# Patient Record
Sex: Male | Born: 1970 | Race: White | Hispanic: Yes | Marital: Married | Smoking: Current every day smoker
Health system: Southern US, Community
[De-identification: ages and names within clinical notes are randomized; demographics above are authoritative.]

## PROBLEM LIST (undated history)

## (undated) DIAGNOSIS — E559 Vitamin D deficiency, unspecified: Secondary | ICD-10-CM

## (undated) DIAGNOSIS — K659 Peritonitis, unspecified: Secondary | ICD-10-CM

## (undated) DIAGNOSIS — N186 End stage renal disease: Secondary | ICD-10-CM

## (undated) DIAGNOSIS — F329 Major depressive disorder, single episode, unspecified: Secondary | ICD-10-CM

## (undated) DIAGNOSIS — B348 Other viral infections of unspecified site: Secondary | ICD-10-CM

## (undated) DIAGNOSIS — F32A Depression, unspecified: Secondary | ICD-10-CM

## (undated) DIAGNOSIS — N529 Male erectile dysfunction, unspecified: Secondary | ICD-10-CM

## (undated) DIAGNOSIS — N289 Disorder of kidney and ureter, unspecified: Secondary | ICD-10-CM

## (undated) DIAGNOSIS — E1029 Type 1 diabetes mellitus with other diabetic kidney complication: Secondary | ICD-10-CM

## (undated) DIAGNOSIS — I10 Essential (primary) hypertension: Secondary | ICD-10-CM

## (undated) DIAGNOSIS — E7800 Pure hypercholesterolemia, unspecified: Secondary | ICD-10-CM

## (undated) DIAGNOSIS — E872 Acidosis, unspecified: Secondary | ICD-10-CM

## (undated) DIAGNOSIS — G43909 Migraine, unspecified, not intractable, without status migrainosus: Secondary | ICD-10-CM

## (undated) DIAGNOSIS — E1142 Type 2 diabetes mellitus with diabetic polyneuropathy: Secondary | ICD-10-CM

## (undated) HISTORY — DX: Major depressive disorder, single episode, unspecified: F32.9

## (undated) HISTORY — PX: PERITONEAL CATHETER REMOVAL: SHX2224

## (undated) HISTORY — DX: Depression, unspecified: F32.A

## (undated) HISTORY — DX: Migraine, unspecified, not intractable, without status migrainosus: G43.909

## (undated) HISTORY — PX: PERITONEAL CATHETER INSERTION: SHX2223

## (undated) HISTORY — DX: Type 2 diabetes mellitus with diabetic polyneuropathy: E11.42

## (undated) HISTORY — PX: KIDNEY TRANSPLANT: SHX239

---

## 2008-04-15 ENCOUNTER — Ambulatory Visit: Payer: Self-pay | Admitting: Internal Medicine

## 2008-04-15 LAB — CONVERTED CEMR LAB
ALT: 9 units/L (ref 0–53)
Albumin: 3.9 g/dL (ref 3.5–5.2)
Alkaline Phosphatase: 98 units/L (ref 39–117)
CO2: 22 meq/L (ref 19–32)
Chloride: 107 meq/L (ref 96–112)
Creatinine, Ser: 1.73 mg/dL — ABNORMAL HIGH (ref 0.40–1.50)
HDL: 44 mg/dL (ref >39)
LDL Cholesterol: 146 mg/dL — ABNORMAL HIGH (ref 0–99)
VLDL: 51 mg/dL — ABNORMAL HIGH (ref 0–40)

## 2008-04-16 ENCOUNTER — Ambulatory Visit: Payer: Self-pay | Admitting: *Deleted

## 2008-04-18 ENCOUNTER — Ambulatory Visit: Payer: Self-pay | Admitting: Internal Medicine

## 2008-04-30 ENCOUNTER — Ambulatory Visit: Payer: Self-pay | Admitting: Internal Medicine

## 2009-01-21 ENCOUNTER — Ambulatory Visit: Payer: Self-pay | Admitting: Internal Medicine

## 2009-02-12 ENCOUNTER — Ambulatory Visit: Payer: Self-pay | Admitting: Internal Medicine

## 2009-02-18 ENCOUNTER — Ambulatory Visit: Payer: Self-pay | Admitting: Internal Medicine

## 2009-03-16 ENCOUNTER — Ambulatory Visit: Payer: Self-pay | Admitting: Internal Medicine

## 2009-03-16 LAB — CONVERTED CEMR LAB
CO2: 17 meq/L — ABNORMAL LOW (ref 19–32)
Calcium: 8.3 mg/dL — ABNORMAL LOW (ref 8.4–10.5)
Chloride: 110 meq/L (ref 96–112)
Glucose, Bld: 204 mg/dL — ABNORMAL HIGH (ref 70–99)
Potassium: 5 meq/L (ref 3.5–5.3)

## 2009-03-25 ENCOUNTER — Ambulatory Visit: Payer: Self-pay | Admitting: Internal Medicine

## 2009-03-25 LAB — CONVERTED CEMR LAB
Alkaline Phosphatase: 100 units/L (ref 39–117)
CO2: 21 meq/L (ref 19–32)
Calcium: 9.2 mg/dL (ref 8.4–10.5)
Chloride: 112 meq/L (ref 96–112)
Cholesterol: 155 mg/dL (ref 0–200)
Potassium: 5.4 meq/L — ABNORMAL HIGH (ref 3.5–5.3)
Sodium: 141 meq/L (ref 135–145)
VLDL: 53 mg/dL — ABNORMAL HIGH (ref 0–40)

## 2009-03-27 ENCOUNTER — Ambulatory Visit: Payer: Self-pay | Admitting: Internal Medicine

## 2009-03-27 LAB — CONVERTED CEMR LAB
Anti Nuclear Antibody(ANA): NEGATIVE
Basophils Relative: 0 % (ref 0–1)
C3 Complement: 109 mg/dL (ref 88–201)
Calcium, Total (PTH): 8.9 mg/dL (ref 8.4–10.5)
Eosinophils Absolute: 0.6 10*3/uL (ref 0.0–0.7)
Ferritin: 69 ng/mL (ref 22–322)
HCT: 45.8 % (ref 39.0–52.0)
Hemoglobin: 14.5 g/dL (ref 13.0–17.0)
Hep A IgM: NEGATIVE
Iron: 135 ug/dL (ref 42–165)
Ketones, ur: NEGATIVE mg/dL
MCV: 97 fL (ref 78.0–100.0)
Magnesium: 2 mg/dL (ref 1.5–2.5)
Monocytes Absolute: 0.9 10*3/uL (ref 0.1–1.0)
Neutro Abs: 9 10*3/uL — ABNORMAL HIGH (ref 1.7–7.7)
Neutrophils Relative %: 66 % (ref 43–77)
Platelets: 271 10*3/uL (ref 150–400)
Saturation Ratios: 51 % (ref 20–55)
Specific Gravity, Urine: 1.012 (ref 1.005–1.030)
TIBC: 265 ug/dL (ref 215–435)
UIBC: 130 ug/dL
Urine Glucose: NEGATIVE mg/dL
WBC, UA: NONE SEEN cells/hpf (ref <3)
WBC: 13.6 10*3/uL — ABNORMAL HIGH (ref 4.0–10.5)

## 2009-04-01 ENCOUNTER — Ambulatory Visit (HOSPITAL_COMMUNITY): Admission: RE | Admit: 2009-04-01 | Discharge: 2009-04-01 | Payer: Self-pay | Admitting: Internal Medicine

## 2009-04-06 ENCOUNTER — Ambulatory Visit: Payer: Self-pay | Admitting: Internal Medicine

## 2009-04-06 LAB — CONVERTED CEMR LAB
CO2: 19 meq/L (ref 19–32)
Calcium: 9.2 mg/dL (ref 8.4–10.5)
Chloride: 108 meq/L (ref 96–112)
Creatinine, Ser: 2.62 mg/dL — ABNORMAL HIGH (ref 0.40–1.50)
Phosphorus: 3.6 mg/dL (ref 2.3–4.6)
Sodium: 141 meq/L (ref 135–145)

## 2009-04-28 ENCOUNTER — Ambulatory Visit: Payer: Self-pay | Admitting: Internal Medicine

## 2009-04-28 LAB — CONVERTED CEMR LAB
Calcium: 8.7 mg/dL (ref 8.4–10.5)
Creatinine, Ser: 2.8 mg/dL — ABNORMAL HIGH (ref 0.40–1.50)
Potassium: 6.1 meq/L — ABNORMAL HIGH (ref 3.5–5.3)

## 2009-04-30 ENCOUNTER — Ambulatory Visit: Payer: Self-pay | Admitting: Internal Medicine

## 2009-04-30 LAB — CONVERTED CEMR LAB
BUN: 54 mg/dL — ABNORMAL HIGH (ref 6–23)
CO2: 20 meq/L (ref 19–32)
Calcium: 8.2 mg/dL — ABNORMAL LOW (ref 8.4–10.5)
Chloride: 107 meq/L (ref 96–112)
Creatinine, Ser: 3.09 mg/dL — ABNORMAL HIGH (ref 0.40–1.50)
Glucose, Bld: 188 mg/dL — ABNORMAL HIGH (ref 70–99)
Potassium: 5 meq/L (ref 3.5–5.3)

## 2009-05-12 ENCOUNTER — Ambulatory Visit: Payer: Self-pay | Admitting: Internal Medicine

## 2009-05-12 LAB — CONVERTED CEMR LAB
Calcium: 9.2 mg/dL (ref 8.4–10.5)
Chloride: 108 meq/L (ref 96–112)
Glucose, Bld: 37 mg/dL — CL (ref 70–99)
Magnesium: 2 mg/dL (ref 1.5–2.5)
Phosphorus: 3.6 mg/dL (ref 2.3–4.6)
Potassium: 5.2 meq/L (ref 3.5–5.3)

## 2009-05-14 ENCOUNTER — Ambulatory Visit (HOSPITAL_COMMUNITY): Admission: RE | Admit: 2009-05-14 | Discharge: 2009-05-14 | Payer: Self-pay | Admitting: Internal Medicine

## 2009-05-14 ENCOUNTER — Ambulatory Visit: Payer: Self-pay | Admitting: Family Medicine

## 2009-06-30 ENCOUNTER — Ambulatory Visit: Payer: Self-pay | Admitting: Internal Medicine

## 2009-06-30 LAB — CONVERTED CEMR LAB
BUN: 39 mg/dL — ABNORMAL HIGH (ref 6–23)
CO2: 22 meq/L (ref 19–32)
Calcium: 9.3 mg/dL (ref 8.4–10.5)
Creatinine, Ser: 2.66 mg/dL — ABNORMAL HIGH (ref 0.40–1.50)
Glucose, Bld: 181 mg/dL — ABNORMAL HIGH (ref 70–99)
Magnesium: 2.2 mg/dL (ref 1.5–2.5)
Potassium: 5 meq/L (ref 3.5–5.3)
Sodium: 142 meq/L (ref 135–145)

## 2009-09-29 ENCOUNTER — Ambulatory Visit: Payer: Self-pay | Admitting: Internal Medicine

## 2009-09-29 LAB — CONVERTED CEMR LAB
Albumin: 4.1 g/dL (ref 3.5–5.2)
Basophils Absolute: 0.1 10*3/uL (ref 0.0–0.1)
Chloride: 105 meq/L (ref 96–112)
Eosinophils Absolute: 0.3 10*3/uL (ref 0.0–0.7)
Eosinophils Relative: 2 % (ref 0–5)
HCT: 42.5 % (ref 39.0–52.0)
Lymphocytes Relative: 15 % (ref 12–46)
MCHC: 32.2 g/dL (ref 30.0–36.0)
Magnesium: 2.3 mg/dL (ref 1.5–2.5)
Monocytes Relative: 5 % (ref 3–12)
Neutrophils Relative %: 78 % — ABNORMAL HIGH (ref 43–77)
Phosphorus: 4.1 mg/dL (ref 2.3–4.6)
RBC: 4.63 M/uL (ref 4.22–5.81)
RDW: 13.6 % (ref 11.5–15.5)

## 2009-10-05 ENCOUNTER — Ambulatory Visit: Payer: Self-pay | Admitting: Internal Medicine

## 2009-10-05 LAB — CONVERTED CEMR LAB
Albumin: 3.8 g/dL (ref 3.5–5.2)
Chloride: 108 meq/L (ref 96–112)
Phosphorus: 3.5 mg/dL (ref 2.3–4.6)

## 2010-01-05 ENCOUNTER — Ambulatory Visit: Payer: Self-pay | Admitting: Internal Medicine

## 2010-01-05 LAB — CONVERTED CEMR LAB
Albumin: 4.2 g/dL (ref 3.5–5.2)
BUN: 57 mg/dL — ABNORMAL HIGH (ref 6–23)
CO2: 18 meq/L — ABNORMAL LOW (ref 19–32)
Chloride: 108 meq/L (ref 96–112)
Glucose, Bld: 39 mg/dL — CL (ref 70–99)
Potassium: 4.8 meq/L (ref 3.5–5.3)
Sodium: 140 meq/L (ref 135–145)

## 2010-01-19 ENCOUNTER — Ambulatory Visit: Payer: Self-pay | Admitting: Internal Medicine

## 2010-01-19 LAB — CONVERTED CEMR LAB
CO2: 21 meq/L (ref 19–32)
Calcium: 8.7 mg/dL (ref 8.4–10.5)
Chloride: 110 meq/L (ref 96–112)
Glucose, Bld: 23 mg/dL — CL (ref 70–99)
Hemoglobin: 14.6 g/dL (ref 13.0–17.0)
Iron: 81 ug/dL (ref 42–165)
MCHC: 32.7 g/dL (ref 30.0–36.0)
MCV: 92.3 fL (ref 78.0–100.0)
Monocytes Absolute: 0.7 10*3/uL (ref 0.1–1.0)
Neutro Abs: 9.9 10*3/uL — ABNORMAL HIGH (ref 1.7–7.7)
Neutrophils Relative %: 65 % (ref 43–77)
RBC: 4.83 M/uL (ref 4.22–5.81)
Saturation Ratios: 28 % (ref 20–55)
Sodium: 141 meq/L (ref 135–145)
TIBC: 291 ug/dL (ref 215–435)
WBC: 15.3 10*3/uL — ABNORMAL HIGH (ref 4.0–10.5)

## 2010-02-18 ENCOUNTER — Ambulatory Visit: Payer: Self-pay | Admitting: Internal Medicine

## 2010-02-18 LAB — CONVERTED CEMR LAB: Hgb A1c MFr Bld: 6.6 % — ABNORMAL HIGH (ref <5.7)

## 2010-04-08 ENCOUNTER — Ambulatory Visit: Payer: Self-pay | Admitting: Internal Medicine

## 2010-04-08 LAB — CONVERTED CEMR LAB
CO2: 22 meq/L (ref 19–32)
Chloride: 107 meq/L (ref 96–112)

## 2010-06-30 ENCOUNTER — Encounter (INDEPENDENT_AMBULATORY_CARE_PROVIDER_SITE_OTHER): Payer: Self-pay | Admitting: *Deleted

## 2010-06-30 LAB — CONVERTED CEMR LAB
BUN: 58 mg/dL — ABNORMAL HIGH (ref 6–23)
CO2: 21 meq/L (ref 19–32)
Calcium: 9 mg/dL (ref 8.4–10.5)
Chloride: 108 meq/L (ref 96–112)
Phosphorus: 3.5 mg/dL (ref 2.3–4.6)
Potassium: 5.1 meq/L (ref 3.5–5.3)
Sodium: 141 meq/L (ref 135–145)
Vit D, 25-Hydroxy: 43 ng/mL (ref 30–89)

## 2010-09-17 ENCOUNTER — Encounter (INDEPENDENT_AMBULATORY_CARE_PROVIDER_SITE_OTHER): Payer: Self-pay | Admitting: *Deleted

## 2010-09-17 LAB — CONVERTED CEMR LAB
BUN: 71 mg/dL — ABNORMAL HIGH (ref 6–23)
CO2: 22 meq/L (ref 19–32)
Calcium: 8.2 mg/dL — ABNORMAL LOW (ref 8.4–10.5)
Chloride: 106 meq/L (ref 96–112)
Cholesterol: 185 mg/dL (ref 0–200)
Creatinine, Ser: 5.11 mg/dL — ABNORMAL HIGH (ref 0.40–1.50)
HDL: 51 mg/dL (ref >39)
Magnesium: 2.2 mg/dL (ref 1.5–2.5)
PTH: 341.3 pg/mL — ABNORMAL HIGH (ref 14.0–72.0)
Sodium: 141 meq/L (ref 135–145)
Total CHOL/HDL Ratio: 3.6
VLDL: 27 mg/dL (ref 0–40)

## 2010-09-29 ENCOUNTER — Ambulatory Visit (HOSPITAL_COMMUNITY)
Admission: RE | Admit: 2010-09-29 | Discharge: 2010-09-29 | Disposition: A | Discharge status: Home / Self Care | Payer: Self-pay | Source: Ambulatory Visit | Attending: Family Medicine | Admitting: Family Medicine

## 2010-09-29 DIAGNOSIS — E119 Type 2 diabetes mellitus without complications: Secondary | ICD-10-CM | POA: Insufficient documentation

## 2010-09-29 DIAGNOSIS — I10 Essential (primary) hypertension: Secondary | ICD-10-CM | POA: Insufficient documentation

## 2010-09-29 DIAGNOSIS — R011 Cardiac murmur, unspecified: Secondary | ICD-10-CM

## 2010-09-29 DIAGNOSIS — E785 Hyperlipidemia, unspecified: Secondary | ICD-10-CM | POA: Insufficient documentation

## 2010-09-29 DIAGNOSIS — F17200 Nicotine dependence, unspecified, uncomplicated: Secondary | ICD-10-CM | POA: Insufficient documentation

## 2010-10-18 ENCOUNTER — Encounter (INDEPENDENT_AMBULATORY_CARE_PROVIDER_SITE_OTHER): Payer: Self-pay | Admitting: *Deleted

## 2010-10-18 LAB — CONVERTED CEMR LAB
BUN: 67 mg/dL — ABNORMAL HIGH (ref 6–23)
CO2: 19 meq/L (ref 19–32)
Glucose, Bld: 189 mg/dL — ABNORMAL HIGH (ref 70–99)

## 2010-11-05 ENCOUNTER — Encounter (INDEPENDENT_AMBULATORY_CARE_PROVIDER_SITE_OTHER): Payer: Self-pay | Admitting: *Deleted

## 2010-11-05 LAB — CONVERTED CEMR LAB
Albumin: 4.5 g/dL (ref 3.5–5.2)
BUN: 62 mg/dL — ABNORMAL HIGH (ref 6–23)
CO2: 20 meq/L (ref 19–32)
Calcium: 8.8 mg/dL (ref 8.4–10.5)
Glucose, Bld: 120 mg/dL — ABNORMAL HIGH (ref 70–99)
Magnesium: 2.2 mg/dL (ref 1.5–2.5)
Phosphorus: 4.8 mg/dL — ABNORMAL HIGH (ref 2.3–4.6)
Sodium: 142 meq/L (ref 135–145)

## 2010-11-26 ENCOUNTER — Other Ambulatory Visit (HOSPITAL_COMMUNITY): Payer: Self-pay | Admitting: General Surgery

## 2010-11-26 ENCOUNTER — Encounter (HOSPITAL_COMMUNITY)
Admission: RE | Admit: 2010-11-26 | Discharge: 2010-11-26 | Disposition: A | Discharge status: Home / Self Care | Payer: Self-pay | Source: Ambulatory Visit | Attending: General Surgery | Admitting: General Surgery

## 2010-11-26 ENCOUNTER — Ambulatory Visit (HOSPITAL_COMMUNITY)
Admission: RE | Admit: 2010-11-26 | Discharge: 2010-11-26 | Disposition: A | Discharge status: Home / Self Care | Payer: Self-pay | Source: Ambulatory Visit | Attending: General Surgery | Admitting: General Surgery

## 2010-11-26 DIAGNOSIS — Z01818 Encounter for other preprocedural examination: Secondary | ICD-10-CM | POA: Insufficient documentation

## 2010-11-26 DIAGNOSIS — Z01812 Encounter for preprocedural laboratory examination: Secondary | ICD-10-CM | POA: Insufficient documentation

## 2010-11-26 DIAGNOSIS — Z01810 Encounter for preprocedural cardiovascular examination: Secondary | ICD-10-CM | POA: Insufficient documentation

## 2010-11-26 DIAGNOSIS — Z01811 Encounter for preprocedural respiratory examination: Secondary | ICD-10-CM

## 2010-11-26 LAB — DIFFERENTIAL
Lymphocytes Relative: 17 % (ref 12–46)
Lymphs Abs: 2.8 10*3/uL (ref 0.7–4.0)
Monocytes Absolute: 0.7 10*3/uL (ref 0.1–1.0)
Neutrophils Relative %: 77 % (ref 43–77)

## 2010-11-26 LAB — BASIC METABOLIC PANEL
CO2: 20 mEq/L (ref 19–32)
GFR calc Af Amer: 14 mL/min — ABNORMAL LOW (ref >60)
Glucose, Bld: 234 mg/dL — ABNORMAL HIGH (ref 70–99)
Potassium: 5.3 mEq/L — ABNORMAL HIGH (ref 3.5–5.1)

## 2010-11-26 LAB — CBC
Hemoglobin: 13.3 g/dL (ref 13.0–17.0)
MCH: 31.3 pg (ref 26.0–34.0)
MCHC: 35.2 g/dL (ref 30.0–36.0)
MCV: 88.9 fL (ref 78.0–100.0)
Platelets: 237 10*3/uL (ref 150–400)
RBC: 4.25 MIL/uL (ref 4.22–5.81)
RDW: 14 % (ref 11.5–15.5)
WBC: 16.6 10*3/uL — ABNORMAL HIGH (ref 4.0–10.5)

## 2010-11-30 ENCOUNTER — Ambulatory Visit (HOSPITAL_COMMUNITY)
Admission: RE | Admit: 2010-11-30 | Discharge: 2010-11-30 | Disposition: A | Discharge status: Home / Self Care | Payer: Self-pay | Source: Ambulatory Visit | Attending: General Surgery | Admitting: General Surgery

## 2010-11-30 ENCOUNTER — Other Ambulatory Visit (HOSPITAL_COMMUNITY): Payer: Self-pay | Admitting: General Surgery

## 2010-11-30 DIAGNOSIS — E1129 Type 2 diabetes mellitus with other diabetic kidney complication: Secondary | ICD-10-CM | POA: Insufficient documentation

## 2010-11-30 DIAGNOSIS — Z01811 Encounter for preprocedural respiratory examination: Secondary | ICD-10-CM

## 2010-11-30 DIAGNOSIS — N186 End stage renal disease: Secondary | ICD-10-CM | POA: Insufficient documentation

## 2010-11-30 LAB — BASIC METABOLIC PANEL
Creatinine, Ser: 5.47 mg/dL — ABNORMAL HIGH (ref 0.4–1.5)
GFR calc Af Amer: 14 mL/min — ABNORMAL LOW (ref >60)
Glucose, Bld: 249 mg/dL — ABNORMAL HIGH (ref 70–99)
Potassium: 4.7 mEq/L (ref 3.5–5.1)

## 2010-11-30 LAB — DIFFERENTIAL
Lymphocytes Relative: 21 % (ref 12–46)
Monocytes Relative: 8 % (ref 3–12)
Neutro Abs: 9.1 10*3/uL — ABNORMAL HIGH (ref 1.7–7.7)

## 2010-11-30 LAB — GLUCOSE, CAPILLARY
Glucose-Capillary: 207 mg/dL — ABNORMAL HIGH (ref 70–99)
Glucose-Capillary: 232 mg/dL — ABNORMAL HIGH (ref 70–99)
Glucose-Capillary: 218 mg/dL — ABNORMAL HIGH (ref 70–99)
Glucose-Capillary: 345 mg/dL — ABNORMAL HIGH (ref 70–99)

## 2010-11-30 LAB — POCT I-STAT 4, (NA,K, GLUC, HGB,HCT): Hemoglobin: 13.6 g/dL (ref 13.0–17.0)

## 2010-11-30 LAB — CBC
HCT: 38.3 % — ABNORMAL LOW (ref 39.0–52.0)
MCH: 30.9 pg (ref 26.0–34.0)
MCV: 89.1 fL (ref 78.0–100.0)
Platelets: 222 10*3/uL (ref 150–400)
RDW: 14.1 % (ref 11.5–15.5)

## 2010-12-09 NOTE — Op Note (Signed)
Mark Miranda, MCCRAVY NO.:  [id number]  MEDICAL RECORD NO.:  [id number]           PATIENT TYPE:  O  LOCATION:  XRAY                         FACILITY:  [facility]  PHYSICIAN:  Anselm Pancoast. Erven Ramson, M.D.DATE OF BIRTH:  [birth year]  DATE OF PROCEDURE:  11/30/2010 DATE OF DISCHARGE:                              OPERATIVE REPORT   PREOPERATIVE DIAGNOSIS:  End-stage renal disease, desires continuous ambulatory peritoneal dialysis secondary to diabetes.  POSTOPERATIVE DIAGNOSIS:  End-stage renal disease, desires continuous ambulatory peritoneal dialysis secondary to diabetes.  OPERATION:  Placement of a Moncrief-Popovich CAPD catheter.  General anesthesia, local supplementation.  SURGEON:  Anselm Pancoast. Zachery Dakins, MD  INDICATION:  Mark Miranda is a 40 year old [place] American who was referred to me by Dr. Allena Katz for placement of Moncrief-Popovich CAPD catheter.  The patient is a diabetic since March 09, 2014.  He presently has had some changes in his insulin management.  He is seeing the renal failure doctors and has a creatinine approximately 5.5 with a BUN in the 70s.  Dr. Allena Katz who I talked to approximately a week ago says he thinks it will be probably 6 months before he actually needs peritoneal dialysis or dialysis and the patient desires CAPD.  He is presently not work.  He used to be ICE planner, but he says because of his medical problems and also the work needs are so slack now that he has not worked for the last year or so.  On physical exam he has had no previous abdominal surgeries.  I cannot appreciate any evidence of a left or right inguinal hernia and the patient understands that the catheter will be buried and then will be exteriorized when the need for CAPD just start.  Probably will be retired in July and I have talked with Dr. Michaell Cowing who is aware of the patient's needs and will plan on exteriorizing the catheter when it is needed.  The patient on his  laboratory studies that were measured Friday; his white count was 16,000, his potassium was 5.2.  The patient has not had any fever or change.  He has had a chest x- ray, everything looks fine, and he had a repeat white count done today 13,800, his potassium which was 5.2 Friday is 4.7 today, and I think it will be safe to proceed on with CAPD catheter placement.  The patient will need general anesthesia and preoperatively was given a gram of Ancef, positioned on the OR table.  Induction of general anesthesia. The time-out was completed and the abdomen was prepped with Betadine surgical scrub and solution and draped in a sterile manner.  A small area to the right and slightly below the umbilical was infiltrated with 0.5% Marcaine with adrenaline and then a little incision was made in the skin down through the thin layer of subcutaneous tissue and then the small vein was sutured with 3-0 chromic.  The external oblique over the rectus was opened.  The underlying rectus muscle was split in direction of its fibers exposing the posterior rectus fascia and peritoneum.  Two hemostats were used to pick up the posterior rectus fascia and  little small opening was made.  Two pursestrings of 2-0 Vicryl were placed and then the CAPD catheter, Moncrief-Popovich was placed.  The little internal cuff is right at the fascia and the pursestring sutures were tied since this is not the form of Western, I used one stitch to kind of anchor the internal cuff so it does not slip and slide.  Next, the catheter was then tunneled after the rectus muscle had been approximated with 3-0 chromic, so it goes obliquely through the muscle layers and I positioned the catheter over guidewire, reinserted the guidewire to make sure it is lying comfortably, and then flushed the catheter also with saline and it goes in the gravity.  I then closed the external oblique with interrupted sutures of 2-0 Prolene, tunneled the  catheter subcutaneously, and then exited to the right lower quadrant and then I tied a 2-0 Vicryl very loosely basically at the internal cuff area just distal to the cuff so that the Prolene can be used as a handle when the catheter is exteriorized.  The catheter was then tunneled on across the abdomen and brought out through the skin through a little stab wound and then I hooked up 5 mL of 100 units of heparin per mL in a 10 mL syringe, flushed the catheter, tied with double tooth suture of 2-0 silk to keep the little heparin in place.  The catheter was then placed through the subcutaneous tissue and the skin incisions were closed with interrupted sutures of 5-0 nylon.  The patient tolerated procedure nicely, was extubated, and sent to the recovery room in stable postop condition.  We will check his glucose in the recovery room.  He will have Vicodin for pain and if his sugars are doing fine, he can be discharged later today. I am going to give him another gram of Ancef since he will not be on antibiotics and see [place] in the office in approximately 10 days for suture removal.     Anselm Pancoast. Zachery Dakins, M.D.     WJW/MEDQ  D:  11/30/2010  T:  11/30/2010  Job:  [id number]  cc:   Dr. Allena Katz  Electronically Signed by Consuello Bossier M.D. on 12/09/2010 11:49:15 AM

## 2011-01-25 ENCOUNTER — Encounter (INDEPENDENT_AMBULATORY_CARE_PROVIDER_SITE_OTHER): Payer: Self-pay | Admitting: General Surgery

## 2011-01-25 DIAGNOSIS — E1029 Type 1 diabetes mellitus with other diabetic kidney complication: Secondary | ICD-10-CM | POA: Insufficient documentation

## 2011-01-25 DIAGNOSIS — N289 Disorder of kidney and ureter, unspecified: Secondary | ICD-10-CM

## 2011-01-25 DIAGNOSIS — IMO0002 Reserved for concepts with insufficient information to code with codable children: Secondary | ICD-10-CM | POA: Insufficient documentation

## 2011-01-25 DIAGNOSIS — I10 Essential (primary) hypertension: Secondary | ICD-10-CM

## 2011-08-09 LAB — HM DIABETES EYE EXAM

## 2011-10-30 ENCOUNTER — Emergency Department (HOSPITAL_COMMUNITY): Payer: Self-pay

## 2011-10-30 ENCOUNTER — Other Ambulatory Visit: Payer: Self-pay

## 2011-10-30 ENCOUNTER — Emergency Department (HOSPITAL_COMMUNITY)
Admission: EM | Admit: 2011-10-30 | Discharge: 2011-10-31 | Disposition: A | Discharge status: Home / Self Care | Payer: Self-pay | Attending: Emergency Medicine | Admitting: Emergency Medicine

## 2011-10-30 ENCOUNTER — Encounter (HOSPITAL_COMMUNITY): Payer: Self-pay | Admitting: *Deleted

## 2011-10-30 DIAGNOSIS — R209 Unspecified disturbances of skin sensation: Secondary | ICD-10-CM | POA: Insufficient documentation

## 2011-10-30 DIAGNOSIS — E7800 Pure hypercholesterolemia, unspecified: Secondary | ICD-10-CM | POA: Insufficient documentation

## 2011-10-30 DIAGNOSIS — Z79899 Other long term (current) drug therapy: Secondary | ICD-10-CM | POA: Insufficient documentation

## 2011-10-30 DIAGNOSIS — I10 Essential (primary) hypertension: Secondary | ICD-10-CM | POA: Insufficient documentation

## 2011-10-30 DIAGNOSIS — R569 Unspecified convulsions: Secondary | ICD-10-CM | POA: Insufficient documentation

## 2011-10-30 DIAGNOSIS — E119 Type 2 diabetes mellitus without complications: Secondary | ICD-10-CM | POA: Insufficient documentation

## 2011-10-30 HISTORY — DX: Pure hypercholesterolemia, unspecified: E78.00

## 2011-10-30 HISTORY — DX: Essential (primary) hypertension: I10

## 2011-10-30 HISTORY — DX: Disorder of kidney and ureter, unspecified: N28.9

## 2011-10-30 LAB — DIFFERENTIAL
Basophils Absolute: 0.1 10*3/uL (ref 0.0–0.1)
Eosinophils Relative: 2 % (ref 0–5)
Lymphocytes Relative: 12 % (ref 12–46)
Lymphs Abs: 1.6 10*3/uL (ref 0.7–4.0)
Neutro Abs: 10.6 10*3/uL — ABNORMAL HIGH (ref 1.7–7.7)
Neutrophils Relative %: 81 % — ABNORMAL HIGH (ref 43–77)

## 2011-10-30 LAB — COMPREHENSIVE METABOLIC PANEL
ALT: 10 U/L (ref 0–53)
AST: 14 U/L (ref 0–37)
Alkaline Phosphatase: 105 U/L (ref 39–117)
CO2: 18 mEq/L — ABNORMAL LOW (ref 19–32)
Calcium: 6.3 mg/dL — CL (ref 8.4–10.5)
Chloride: 100 mEq/L (ref 96–112)
GFR calc non Af Amer: 6 mL/min — ABNORMAL LOW (ref >90)
Glucose, Bld: 84 mg/dL (ref 70–99)
Potassium: 4 mEq/L (ref 3.5–5.1)
Sodium: 139 mEq/L (ref 135–145)

## 2011-10-30 LAB — CBC
Platelets: 234 10*3/uL (ref 150–400)
RBC: 3.47 MIL/uL — ABNORMAL LOW (ref 4.22–5.81)
RDW: 14.4 % (ref 11.5–15.5)
WBC: 13.1 10*3/uL — ABNORMAL HIGH (ref 4.0–10.5)

## 2011-10-30 LAB — GLUCOSE, CAPILLARY: Glucose-Capillary: 87 mg/dL (ref 70–99)

## 2011-10-30 MED ORDER — SODIUM CHLORIDE 0.9 % IV SOLN
1.0000 g | INTRAVENOUS | Status: AC
  Administered 2011-10-30: 1 g via INTRAVENOUS
  Filled 2011-10-30: qty 10

## 2011-10-30 NOTE — ED Notes (Signed)
Pt states that he was having "problems controlling his right arm" around 6pm (and one episode yesterday).  Pt also had facial numbness (bilateral).  Family reports that pt was having "problems controlling his limbs".  No facial asymetry, no focal weakness or slurred speech, no arm drift.  EDP to the bedside at this time (speaking spanish with pt.  Pt speaks spanish as well as daughter at the the bedside

## 2011-10-30 NOTE — ED Notes (Signed)
MD aware of pt's elevated calcium level

## 2011-10-30 NOTE — ED Notes (Signed)
MD at bedside for eval.

## 2011-10-30 NOTE — ED Notes (Signed)
Pt c/o pain at IV site. IV flushes well with good blood return. No signs of infiltration noted. Advised pt i would d/c and place in new site for his comfort if he would like. Pt declined at this time.

## 2011-10-30 NOTE — ED Provider Notes (Addendum)
History     CSN: [id number]  Arrival date & time 10/30/11  1859   First MD Initiated Contact with Patient 10/30/11 2044      Chief Complaint  Patient presents with  . Numbness    (Consider location/radiation/quality/duration/timing/severity/associated sxs/prior treatment) Patient is a 41 y.o. male presenting with seizures. The history is provided by the patient.  Seizures  This is a new problem. The current episode started 1 to 2 hours ago. The problem has been resolved. There was 1 seizure. Duration: 10 minutes. Characteristics include rhythmic jerking. Characteristics do not include eye blinking, eye deviation, bowel incontinence, bladder incontinence, loss of consciousness or bit tongue. The episode was witnessed.    Past Medical History  Diagnosis Date  . Diabetes mellitus   . Renal disorder   . Hypertension   . Hypercholesterolemia     Past Surgical History  Procedure Date  . Peritoneal catheter insertion     No family history on file.  History  Substance Use Topics  . Smoking status: Current Everyday Smoker  . Smokeless tobacco: Not on file  . Alcohol Use: No      Review of Systems  Gastrointestinal: Negative for bowel incontinence.  Genitourinary: Negative for bladder incontinence.  Neurological: Positive for seizures. Negative for loss of consciousness.  All other systems reviewed and are negative.    Allergies  Review of patient's allergies indicates no known allergies.  Home Medications   Current Outpatient Rx  Name Route Sig Dispense Refill  . AMLODIPINE BESYLATE 10 MG PO TABS Oral Take 10 mg by mouth at bedtime.    Marland Kitchen CALCIUM ACETATE (PHOS BINDER) 667 MG/5ML PO SOLN Oral Take 667 mg by mouth 3 (three) times daily with meals.    Marland Kitchen DILTIAZEM HCL ER BEADS 240 MG PO CP24 Oral Take 240 mg by mouth daily.    . INSULIN ASPART PROT & ASPART (70-30) 100 UNIT/ML [place] High SUSP Subcutaneous Inject 15-30 Units into the skin 2 (two) times daily with a meal. Inject  30 units in the morning, and inject 15 units at night.    Marland Kitchen ROSUVASTATIN CALCIUM 10 MG PO TABS Oral Take 10 mg by mouth at bedtime.    Marland Kitchen SILDENAFIL CITRATE 50 MG PO TABS Oral Take 50 mg by mouth daily as needed. For E.D.      BP 144/86  Pulse 87  Temp(Src) 98 F (36.7 C) (Oral)  Resp 13  Ht 5\' 7"  (1.702 m)  Wt 140 lb (63.504 kg)  BMI 21.93 kg/m2  SpO2 99%  Physical Exam  Constitutional: He is oriented to person, place, and time. He appears well-developed and well-nourished.  HENT:  Head: Normocephalic and atraumatic.  Mouth/Throat: Oropharynx is clear and moist.  Eyes: Pupils are equal, round, and reactive to light.  Neck: Normal range of motion. Neck supple.  Cardiovascular: Normal rate, regular rhythm, normal heart sounds and intact distal pulses.   Pulmonary/Chest: Effort normal and breath sounds normal.  Abdominal: Soft. Bowel sounds are normal.  Musculoskeletal: Normal range of motion.  Neurological: He is alert and oriented to person, place, and time. He has normal strength. No cranial nerve deficit or sensory deficit. Coordination normal. GCS eye subscore is 4. GCS verbal subscore is 5. GCS motor subscore is 6.    ED Course  Procedures (including critical care time)  Results for orders placed during the hospital encounter of 10/30/11  GLUCOSE, CAPILLARY      Component Value Range   Glucose-Capillary 87  70 -  99 (mg/dL)  CBC      Component Value Range   WBC 13.1 (*) 4.0 - 10.5 (K/uL)   RBC 3.47 (*) 4.22 - 5.81 (MIL/uL)   Hemoglobin 10.6 (*) 13.0 - 17.0 (g/dL)   HCT 91.4 (*) 78.2 - 52.0 (%)   MCV 89.0  78.0 - 100.0 (fL)   MCH 30.5  26.0 - 34.0 (pg)   MCHC 34.3  30.0 - 36.0 (g/dL)   RDW 95.6  21.3 - 08.6 (%)   Platelets 234  150 - 400 (K/uL)  DIFFERENTIAL      Component Value Range   Neutrophils Relative 81 (*) 43 - 77 (%)   Neutro Abs 10.6 (*) 1.7 - 7.7 (K/uL)   Lymphocytes Relative 12  12 - 46 (%)   Lymphs Abs 1.6  0.7 - 4.0 (K/uL)   Monocytes Relative 6  3  - 12 (%)   Monocytes Absolute 0.7  0.1 - 1.0 (K/uL)   Eosinophils Relative 2  0 - 5 (%)   Eosinophils Absolute 0.2  0.0 - 0.7 (K/uL)   Basophils Relative 0  0 - 1 (%)   Basophils Absolute 0.1  0.0 - 0.1 (K/uL)  COMPREHENSIVE METABOLIC PANEL      Component Value Range   Sodium 139  135 - 145 (mEq/L)   Potassium 4.0  3.5 - 5.1 (mEq/L)   Chloride 100  96 - 112 (mEq/L)   CO2 18 (*) 19 - 32 (mEq/L)   Glucose, Bld 84  70 - 99 (mg/dL)   BUN 93 (*) 6 - 23 (mg/dL)   Creatinine, Ser 57.84 (*) 0.50 - 1.35 (mg/dL)   Calcium 6.3 (*) 8.4 - 10.5 (mg/dL)   Total Protein 7.3  6.0 - 8.3 (g/dL)   Albumin 3.8  3.5 - 5.2 (g/dL)   AST 14  0 - 37 (U/L)   ALT 10  0 - 53 (U/L)   Alkaline Phosphatase 105  39 - 117 (U/L)   Total Bilirubin 0.2 (*) 0.3 - 1.2 (mg/dL)   GFR calc non Af Amer 6 (*) >90 (mL/min)   GFR calc Af Amer 7 (*) >90 (mL/min)   Ct Head Wo Contrast  10/30/2011  *RADIOLOGY REPORT*  Clinical Data: 41 year old male with seizure, and facial numbness.  CT HEAD WITHOUT CONTRAST  Technique:  Contiguous axial images were obtained from the base of the skull through the vertex without contrast.  Comparison: None.  Findings: No acute intracranial abnormalities are identified, including mass lesion or mass effect, hydrocephalus, extra-axial fluid collection, midline shift, hemorrhage, or acute infarction.  The visualized bony calvarium is unremarkable.  IMPRESSION: No evidence of acute intracranial abnormality.  Original Report Authenticated By: Rosendo Gros, M.D.     Date: 10/31/2011  Rate: 88  Rhythm: normal sinus rhythm  QRS Axis: normal  Intervals: normal  ST/T Wave abnormalities: nonspecific T wave changes  Conduction Disutrbances:none  Narrative Interpretation:   Old EKG Reviewed: none available     1. Hypocalcemia   2. Seizure       MDM  Pt with hypocalcemia, seizure activity.  Is on calcium replacement, but unsure what his baseline is.  Albumin normal.  Spoke with nephrologist on  call who recommends admission.  I advised pt of this, but he is refusing admission.  Advised him of risks of leaving including having another seizure, brain damage and death.  Pt still refusing admission.  Speaks English well.  Seems to comprehend fully.  Also discussed with family members in  room.  Advised him to see his nephrologist, Dr. Allena Katz tomorrow.        Rolan Bucco, MD 10/30/11 3086  Rolan Bucco, MD 10/31/11 5784  Rolan Bucco, MD 10/31/11 6962  Rolan Bucco, MD 11/30/11 1721

## 2011-10-30 NOTE — ED Notes (Signed)
Pt returns from CT. Ambulates to restroom in NAD

## 2011-10-30 NOTE — Discharge Instructions (Signed)
Hypocalcemia, Adult Hypocalcemia is low blood calcium. Calcium is important for cells to function in the body. Low blood calcium can cause a variety of symptoms and problems. CAUSES   Low levels of a body protein called albumin.   Problems with the parathyroid glands or surgical removal of the parathyroid glands. The parathyroid glands maintain the body's level of calcium.   Decreased production or improper use of parathyroid hormone.   Lack (deficiency) of vitamin D or magnesium or both.   Intestinal problems that interfere with nutrient absorption.   Alcoholism.   Kidney problems.   Inflammation of the pancreas (pancreatitis).   Certain medicines.   Severe infections (sepsis).   Infiltrative diseases. With these diseases the parathyroid glands are filled with cells or substances that are not normally present. Examples include:   Sarcoidosis.   Hemachromatosis.   Breakdown of large amounts of muscle fiber.   High levels of phosphate in the body.   Cancer.   Massive blood transfusions which usually occur with severe trauma.  SYMPTOMS   Numbness and tingling in the fingers, toes, or around the mouth.   Muscle aches or cramps, especially in the legs, feet, and back.   Muscle twitches.   Shortness of breath or wheezing.   Difficulty swallowing.   Changes in the sound of the voice.   General weakness.   Fainting.   Fast heart beats (palpitations).   Chest pain.   Irritability.   Difficulty thinking.   Memory problems or confusion.   Severe fatigue.   Changes in personality.   Depression and anxiety.   Shaking uncontrollably (seizures).   Coarse, brittle hair and nails.   Dry skin or lasting (chronic) skin diseases (psoriasis, eczema, or dermatitis).   Clouding of the eye lens (cataracts).   Abdominal cramping or pain.  DIAGNOSIS  Hypocalcemia is usually diagnosed through blood tests that reveal a low level of blood calcium. Other tests,  such as a recording of the electrical activity of the heart (electrocardiogram, EKG), may be performed in order to diagnose the underlying cause of the condition. TREATMENT  Treatment for hypocalcemia includes giving calcium supplements. These can be given by mouth or by intravenous (IV) access tube, depending on the severity of the symptoms and deficiency. Other minerals (electrolytes), such as magnesium, may also be given. HOME CARE INSTRUCTIONS   Meet with a dietitian to make sure you are eating the most healthful diet possible, or follow diet instructions as directed by your caregiver.   Follow up with your caregiver as directed.  SEEK IMMEDIATE MEDICAL CARE IF:   You develop chest pain.   You develop persistent rapid or irregular heartbeats.   You have difficulty breathing.   You faint.   You develop increased fatigue.   You have new swelling in the feet, ankles, or legs.   You develop increased muscle twitching.   You start to have seizures.   You develop confusion.   You develop mood, memory, or personality changes.  MAKE SURE YOU:   Understand these instructions.   Will watch your condition.   Will get help right away if you are not doing well or get worse.  Document Released: 01/12/2010 Document Revised: 07/14/2011 Document Reviewed: 01/12/2010 Exit[employer] Patient Information 2012 [employer]. 

## 2011-11-21 ENCOUNTER — Other Ambulatory Visit: Payer: Self-pay

## 2011-11-21 DIAGNOSIS — Z01810 Encounter for preprocedural cardiovascular examination: Secondary | ICD-10-CM

## 2011-11-21 DIAGNOSIS — N185 Chronic kidney disease, stage 5: Secondary | ICD-10-CM

## 2011-12-07 ENCOUNTER — Encounter: Payer: Self-pay | Admitting: Vascular Surgery

## 2011-12-08 ENCOUNTER — Ambulatory Visit: Payer: Self-pay | Admitting: Vascular Surgery

## 2011-12-19 ENCOUNTER — Other Ambulatory Visit (HOSPITAL_COMMUNITY): Payer: Self-pay | Admitting: Family Medicine

## 2011-12-19 DIAGNOSIS — M71321 Other bursal cyst, right elbow: Secondary | ICD-10-CM

## 2011-12-19 DIAGNOSIS — IMO0002 Reserved for concepts with insufficient information to code with codable children: Secondary | ICD-10-CM

## 2011-12-22 ENCOUNTER — Ambulatory Visit (HOSPITAL_COMMUNITY)
Admission: RE | Admit: 2011-12-22 | Discharge: 2011-12-22 | Disposition: A | Discharge status: Home / Self Care | Payer: Self-pay | Source: Ambulatory Visit | Attending: Family Medicine | Admitting: Family Medicine

## 2011-12-22 DIAGNOSIS — M7020 Olecranon bursitis, unspecified elbow: Secondary | ICD-10-CM | POA: Insufficient documentation

## 2011-12-22 DIAGNOSIS — L723 Sebaceous cyst: Secondary | ICD-10-CM | POA: Insufficient documentation

## 2011-12-22 DIAGNOSIS — IMO0002 Reserved for concepts with insufficient information to code with codable children: Secondary | ICD-10-CM

## 2011-12-28 ENCOUNTER — Encounter: Payer: Self-pay | Admitting: Vascular Surgery

## 2011-12-29 ENCOUNTER — Ambulatory Visit (INDEPENDENT_AMBULATORY_CARE_PROVIDER_SITE_OTHER): Payer: Self-pay | Admitting: Vascular Surgery

## 2011-12-29 ENCOUNTER — Ambulatory Visit (INDEPENDENT_AMBULATORY_CARE_PROVIDER_SITE_OTHER): Payer: Self-pay | Admitting: *Deleted

## 2011-12-29 ENCOUNTER — Encounter: Payer: Self-pay | Admitting: Vascular Surgery

## 2011-12-29 VITALS — BP 148/84 | HR 73 | Temp 98.1°F | Resp 20 | Ht 68.0 in | Wt 130.0 lb

## 2011-12-29 DIAGNOSIS — N186 End stage renal disease: Secondary | ICD-10-CM | POA: Insufficient documentation

## 2011-12-29 DIAGNOSIS — N185 Chronic kidney disease, stage 5: Secondary | ICD-10-CM

## 2011-12-29 DIAGNOSIS — Z01810 Encounter for preprocedural cardiovascular examination: Secondary | ICD-10-CM

## 2011-12-29 NOTE — Progress Notes (Signed)
[facility] HISTORY AND PHYSICAL   History of Present Illness:  Patient is a 41 y.o. year old male who presents for placement of a permanent hemodialysis access. The patient is right handed .  The patient is not currently on hemodialysis.  The cause of renal failure is thought to be secondary to hypertension diabetes.  Other chronic medical problems include elevated cholesterol.  Past Medical History  Diagnosis Date  . Diabetes mellitus   . Renal disorder   . Hypertension   . Hypercholesterolemia     Past Surgical History  Procedure Date  . Peritoneal catheter insertion      Social History History  Substance Use Topics  . Smoking status: Current Everyday Smoker -- 1.0 packs/day    Types: Cigarettes  . Smokeless tobacco: Never Used  . Alcohol Use: No    Family History Family History  Problem Relation Age of Onset  . Diabetes Mother     Allergies  No Known Allergies   Current Outpatient Prescriptions  Medication Sig Dispense Refill  . amLODipine (NORVASC) 10 MG tablet Take 10 mg by mouth at bedtime.      . calcium acetate, Phos Binder, (PHOSLYRA) 667 MG/5ML SOLN Take 667 mg by mouth 3 (three) times daily with meals.      Marland Kitchen diltiazem (TIAZAC) 240 MG 24 hr capsule Take 240 mg by mouth daily.      . insulin aspart protamine-insulin aspart (NOVOLOG 70/30) (70-30) 100 UNIT/ML injection Inject 15-30 Units into the skin 2 (two) times daily with a meal. Inject 30 units in the morning, and inject 15 units at night.      . rosuvastatin (CRESTOR) 10 MG tablet Take 10 mg by mouth at bedtime.      . sildenafil (VIAGRA) 50 MG tablet Take 50 mg by mouth daily as needed. For E.D.      . DISCONTD: diltiazem (CARDIZEM) 30 MG tablet Take 30 mg by mouth 4 (four) times daily. Ask patient to verify dosage and frequency. Not indicated on medical history form dated 10/23/10.       Marland Kitchen DISCONTD: insulin lispro (HUMALOG) 100 UNIT/ML injection Inject into the skin 3  (three) times daily before meals. Ask patient to verify name/dosage/. History form noted dosage of 34 units during day and 22 units at night.         ROS:   General:  No weight loss, Fever, chills  HEENT: No recent headaches, no nasal bleeding, no visual changes, no sore throat  Neurologic: No dizziness, blackouts, seizures. No recent symptoms of stroke or mini- stroke. No recent episodes of slurred speech, or temporary blindness.  Cardiac: No recent episodes of chest pain/pressure, no shortness of breath at rest.  No shortness of breath with exertion.  Denies history of atrial fibrillation or irregular heartbeat  Vascular: No history of rest pain in feet.  No history of claudication.  No history of non-healing ulcer, No history of DVT   Pulmonary: No home oxygen, no productive cough, no hemoptysis,  No asthma or wheezing  Musculoskeletal:  [ ]  Arthritis, [ ]  Low back pain,  [ ]  Joint pain  Hematologic:No history of hypercoagulable state.  No history of easy bleeding.  No history of anemia  Gastrointestinal: No hematochezia or melena,  No gastroesophageal reflux, no trouble swallowing  Urinary: [x ] chronic Kidney disease, [ ]  on HD - [ ]  MWF or [ ]  TTHS, [ ]  Burning with urination, [ ]  Frequent urination, [ ]   Difficulty urinating;   Skin: No rashes  Psychological: No history of anxiety,  No history of depression   Physical Examination  Filed Vitals:   12/29/11 1551  BP: 148/84  Pulse: 73  Temp: 98.1 F (36.7 C)  TempSrc: Oral  Resp: 20  Height: 5\' 8"  (1.727 m)  Weight: 130 lb (58.968 kg)    Body mass index is 19.77 kg/(m^2).  General:  Alert and oriented, no acute distress HEENT: Normal Neck: No bruit or JVD Pulmonary: Clear to auscultation bilaterally Cardiac: Regular Rate and Rhythm without murmur Gastrointestinal: Soft, non-tender, non-distended Skin: No rash Extremity Pulses:  2+ radial, brachial pulses bilaterally Musculoskeletal: No deformity or  edema  Neurologic: Upper and lower extremity motor 5/5 and symmetric  DATA: Patient had a bilateral upper extremity vein mapping today which I reviewed and interpreted. Shows the cephalic vein on the right arm is less than 2 mm throughout most of its course the basilic vein was 2-3 mm on the right 2-1/2 mm in the upper arm on the left the left cephalic vein was between 25 and 30 mm but of larger caliber in the upper arm   ASSESSMENT: Needs hemodialysis access    PLAN:  Left brachiocephalic AV fistula scheduled for 01/10/2012. Risk, benefits, and alternatives to access surgery were discussed.  The patient is aware the risks include but are not limited to: bleeding, infection, steal syndrome, nerve damage, ischemic neuropathy, failure to mature, and need for additional procedures. The patient agrees to proceed.  Fabienne Bruns, MD [facility] Office: [phone] Pager: [phone]

## 2012-01-05 ENCOUNTER — Encounter (HOSPITAL_COMMUNITY): Payer: Self-pay | Admitting: Respiratory Therapy

## 2012-01-05 ENCOUNTER — Other Ambulatory Visit: Payer: Self-pay

## 2012-01-06 ENCOUNTER — Other Ambulatory Visit: Payer: Self-pay | Admitting: *Deleted

## 2012-01-06 ENCOUNTER — Emergency Department (HOSPITAL_COMMUNITY): Payer: Medicaid Other

## 2012-01-06 ENCOUNTER — Other Ambulatory Visit: Payer: Self-pay

## 2012-01-06 ENCOUNTER — Inpatient Hospital Stay (HOSPITAL_COMMUNITY)
Admission: EM | Admit: 2012-01-06 | Discharge: 2012-01-14 | DRG: 674 | Disposition: A | Discharge status: Home / Self Care | Payer: Medicaid Other | Attending: Family Medicine | Admitting: Family Medicine

## 2012-01-06 ENCOUNTER — Encounter (HOSPITAL_COMMUNITY)
Admission: RE | Admit: 2012-01-06 | Discharge: 2012-01-06 | Disposition: A | Discharge status: Home / Self Care | Payer: Medicaid Other | Source: Ambulatory Visit | Attending: Vascular Surgery | Admitting: Vascular Surgery

## 2012-01-06 ENCOUNTER — Encounter (HOSPITAL_COMMUNITY): Payer: Self-pay

## 2012-01-06 DIAGNOSIS — N289 Disorder of kidney and ureter, unspecified: Secondary | ICD-10-CM

## 2012-01-06 DIAGNOSIS — Y849 Medical procedure, unspecified as the cause of abnormal reaction of the patient, or of later complication, without mention of misadventure at the time of the procedure: Secondary | ICD-10-CM | POA: Diagnosis present

## 2012-01-06 DIAGNOSIS — Z9889 Other specified postprocedural states: Secondary | ICD-10-CM

## 2012-01-06 DIAGNOSIS — E1065 Type 1 diabetes mellitus with hyperglycemia: Principal | ICD-10-CM | POA: Diagnosis present

## 2012-01-06 DIAGNOSIS — D72829 Elevated white blood cell count, unspecified: Secondary | ICD-10-CM | POA: Diagnosis present

## 2012-01-06 DIAGNOSIS — E1029 Type 1 diabetes mellitus with other diabetic kidney complication: Secondary | ICD-10-CM | POA: Diagnosis present

## 2012-01-06 DIAGNOSIS — Z992 Dependence on renal dialysis: Secondary | ICD-10-CM

## 2012-01-06 DIAGNOSIS — I120 Hypertensive chronic kidney disease with stage 5 chronic kidney disease or end stage renal disease: Secondary | ICD-10-CM | POA: Diagnosis present

## 2012-01-06 DIAGNOSIS — Z833 Family history of diabetes mellitus: Secondary | ICD-10-CM

## 2012-01-06 DIAGNOSIS — E7800 Pure hypercholesterolemia, unspecified: Secondary | ICD-10-CM | POA: Diagnosis present

## 2012-01-06 DIAGNOSIS — N186 End stage renal disease: Secondary | ICD-10-CM

## 2012-01-06 DIAGNOSIS — E1069 Type 1 diabetes mellitus with other specified complication: Secondary | ICD-10-CM

## 2012-01-06 DIAGNOSIS — D7289 Other specified disorders of white blood cells: Secondary | ICD-10-CM | POA: Diagnosis present

## 2012-01-06 DIAGNOSIS — N19 Unspecified kidney failure: Secondary | ICD-10-CM | POA: Diagnosis present

## 2012-01-06 DIAGNOSIS — Z7901 Long term (current) use of anticoagulants: Secondary | ICD-10-CM

## 2012-01-06 DIAGNOSIS — T82898A Other specified complication of vascular prosthetic devices, implants and grafts, initial encounter: Secondary | ICD-10-CM | POA: Diagnosis present

## 2012-01-06 DIAGNOSIS — I10 Essential (primary) hypertension: Secondary | ICD-10-CM | POA: Diagnosis present

## 2012-01-06 DIAGNOSIS — F17200 Nicotine dependence, unspecified, uncomplicated: Secondary | ICD-10-CM | POA: Diagnosis present

## 2012-01-06 DIAGNOSIS — Z79899 Other long term (current) drug therapy: Secondary | ICD-10-CM

## 2012-01-06 DIAGNOSIS — D649 Anemia, unspecified: Secondary | ICD-10-CM | POA: Diagnosis present

## 2012-01-06 DIAGNOSIS — E162 Hypoglycemia, unspecified: Secondary | ICD-10-CM | POA: Diagnosis present

## 2012-01-06 LAB — PROTIME-INR: INR: 1.03 (ref 0.00–1.49)

## 2012-01-06 LAB — SURGICAL PCR SCREEN
MRSA, PCR: NEGATIVE
Staphylococcus aureus: NEGATIVE

## 2012-01-06 LAB — BASIC METABOLIC PANEL
CO2: 17 mEq/L — ABNORMAL LOW (ref 19–32)
Chloride: 102 mEq/L (ref 96–112)
Creatinine, Ser: 12.72 mg/dL — ABNORMAL HIGH (ref 0.50–1.35)
GFR calc Af Amer: 5 mL/min — ABNORMAL LOW (ref >90)
Potassium: 4.1 mEq/L (ref 3.5–5.1)
Sodium: 141 mEq/L (ref 135–145)

## 2012-01-06 LAB — URINALYSIS, ROUTINE W REFLEX MICROSCOPIC
Glucose, UA: 100 mg/dL — AB
Ketones, ur: NEGATIVE mg/dL
Nitrite: NEGATIVE
pH: 5 (ref 5.0–8.0)

## 2012-01-06 LAB — GLUCOSE, CAPILLARY
Glucose-Capillary: 26 mg/dL — CL (ref 70–99)
Glucose-Capillary: 134 mg/dL — ABNORMAL HIGH (ref 70–99)
Glucose-Capillary: 142 mg/dL — ABNORMAL HIGH (ref 70–99)
Glucose-Capillary: 207 mg/dL — ABNORMAL HIGH (ref 70–99)

## 2012-01-06 LAB — URINE MICROSCOPIC-ADD ON

## 2012-01-06 LAB — APTT: aPTT: 28 seconds (ref 24–37)

## 2012-01-06 LAB — DIFFERENTIAL
Basophils Relative: 0 % (ref 0–1)
Lymphocytes Relative: 6 % — ABNORMAL LOW (ref 12–46)
Lymphs Abs: 1.2 10*3/uL (ref 0.7–4.0)
Monocytes Absolute: 0.8 10*3/uL (ref 0.1–1.0)
Monocytes Relative: 4 % (ref 3–12)
Neutro Abs: 15.9 10*3/uL — ABNORMAL HIGH (ref 1.7–7.7)
Neutrophils Relative %: 88 % — ABNORMAL HIGH (ref 43–77)

## 2012-01-06 LAB — CBC
HCT: 31.1 % — ABNORMAL LOW (ref 39.0–52.0)
Hemoglobin: 11.1 g/dL — ABNORMAL LOW (ref 13.0–17.0)
MCHC: 35.7 g/dL (ref 30.0–36.0)
RBC: 3.61 MIL/uL — ABNORMAL LOW (ref 4.22–5.81)

## 2012-01-06 MED ORDER — DEXTROSE 5 % IV SOLN: Freq: Once | INTRAVENOUS | Status: DC

## 2012-01-06 MED ORDER — DEXTROSE 50 % IV SOLN
25.0000 g | Freq: Once | INTRAVENOUS | Status: AC
  Administered 2012-01-06: 25 g via INTRAVENOUS
  Filled 2012-01-06: qty 50

## 2012-01-06 MED ORDER — MORPHINE SULFATE 2 MG/ML IJ SOLN
1.0000 mg | INTRAMUSCULAR | Status: DC | PRN
  Administered 2012-01-10: 1 mg via INTRAVENOUS
  Filled 2012-01-06: qty 1

## 2012-01-06 MED ORDER — INSULIN ASPART 100 UNIT/ML ~~LOC~~ SOLN
0.0000 [IU] | Freq: Three times a day (TID) | SUBCUTANEOUS | Status: DC
  Administered 2012-01-07: 7 [IU] via SUBCUTANEOUS
  Administered 2012-01-07 (×2): 9 [IU] via SUBCUTANEOUS

## 2012-01-06 MED ORDER — ATORVASTATIN CALCIUM 20 MG PO TABS
20.0000 mg | ORAL_TABLET | Freq: Every day | ORAL | Status: DC
  Administered 2012-01-07 – 2012-01-13 (×7): 20 mg via ORAL
  Filled 2012-01-06 (×9): qty 1

## 2012-01-06 MED ORDER — CEFAZOLIN SODIUM 1-5 GM-% IV SOLN
1.0000 g | Freq: Once | INTRAVENOUS | Status: DC
  Filled 2012-01-06: qty 50

## 2012-01-06 MED ORDER — CALCIUM CARBONATE ANTACID 500 MG PO CHEW
1.0000 | CHEWABLE_TABLET | Freq: Three times a day (TID) | ORAL | Status: DC
  Administered 2012-01-06 – 2012-01-14 (×21): 200 mg via ORAL
  Filled 2012-01-06 (×25): qty 1

## 2012-01-06 MED ORDER — NICOTINE 21 MG/24HR TD PT24
21.0000 mg | MEDICATED_PATCH | Freq: Once | TRANSDERMAL | Status: AC
  Administered 2012-01-06: 21 mg via TRANSDERMAL
  Filled 2012-01-06: qty 1

## 2012-01-06 MED ORDER — ACETAMINOPHEN 325 MG PO TABS: 650.0000 mg | ORAL_TABLET | Freq: Four times a day (QID) | ORAL | Status: DC | PRN

## 2012-01-06 MED ORDER — SODIUM CHLORIDE 0.9 % IV SOLN
INTRAVENOUS | Status: DC
  Administered 2012-01-06: 20:00:00 via INTRAVENOUS

## 2012-01-06 MED ORDER — HYDROCODONE-ACETAMINOPHEN 5-325 MG PO TABS
1.0000 | ORAL_TABLET | ORAL | Status: DC | PRN
  Administered 2012-01-13: 2 via ORAL
  Filled 2012-01-06: qty 2

## 2012-01-06 MED ORDER — AMLODIPINE BESYLATE 10 MG PO TABS
10.0000 mg | ORAL_TABLET | Freq: Every day | ORAL | Status: DC
  Administered 2012-01-06 – 2012-01-13 (×8): 10 mg via ORAL
  Filled 2012-01-06 (×10): qty 1

## 2012-01-06 MED ORDER — DILTIAZEM HCL ER BEADS 240 MG PO CP24
240.0000 mg | ORAL_CAPSULE | Freq: Every day | ORAL | Status: DC
  Administered 2012-01-07 – 2012-01-08 (×2): 240 mg via ORAL
  Filled 2012-01-06 (×3): qty 1

## 2012-01-06 MED ORDER — ONDANSETRON HCL 4 MG/2ML IJ SOLN: 4.0000 mg | Freq: Four times a day (QID) | INTRAMUSCULAR | Status: DC | PRN

## 2012-01-06 MED ORDER — DEXTROSE 5 % IV SOLN: INTRAVENOUS | Status: DC

## 2012-01-06 MED ORDER — ACETAMINOPHEN 650 MG RE SUPP: 650.0000 mg | Freq: Four times a day (QID) | RECTAL | Status: DC | PRN

## 2012-01-06 MED ORDER — ONDANSETRON HCL 4 MG PO TABS: 4.0000 mg | ORAL_TABLET | Freq: Four times a day (QID) | ORAL | Status: DC | PRN

## 2012-01-06 MED ORDER — CEFAZOLIN SODIUM 1-5 GM-% IV SOLN
1.0000 g | INTRAVENOUS | Status: AC
  Administered 2012-01-07: 1 g via INTRAVENOUS
  Filled 2012-01-06 (×2): qty 50

## 2012-01-06 NOTE — ED Notes (Signed)
The pt is alert talking wanting to go outside to smoke. Urine sent to the lab

## 2012-01-06 NOTE — ED Notes (Signed)
The admitting doctor does not want to start the d5w drip as long as th cbg is over 100 and he gave a verbal order to feed since the pt is not going to the or until tomorrow

## 2012-01-06 NOTE — Pre-Procedure Instructions (Signed)
20 Mark Miranda  01/06/2012   Your procedure is scheduled on:  January 10, 2012  Report to [facility] Stay [facility] at 5:30 AM.  Call this number if you have problems the morning of surgery: [phone]   Remember:   Do not eat food:After Midnight.  May have clear liquids: up to 4 Hours before arrival.  Clear liquids include soda, tea, black coffee, apple or grape juice, broth.  Take these medicines the morning of surgery with A SIP OF WATER: norvasc, diltiazem   Do not wear jewelry, make-up or nail polish.  Do not wear lotions, powders, or perfumes. You may wear deodorant.  Do not shave 48 hours prior to surgery. Men may shave face and neck.  Do not bring valuables to the hospital.  Contacts, dentures or bridgework may not be worn into surgery.  Leave suitcase in the car. After surgery it may be brought to your room.  For patients admitted to the hospital, checkout time is 11:00 AM the day of discharge.   Patients discharged the day of surgery will not be allowed to drive home.  Name and phone number of your driver: Merik Mignano  Special Instructions: CHG Shower Use Special Wash: 1/2 bottle night before surgery and 1/2 bottle morning of surgery.   Please read over the following fact sheets that you were given: Pain Booklet, Coughing and Deep Breathing and Surgical Site Infection Prevention

## 2012-01-06 NOTE — ED Notes (Signed)
Pt presents for dialysis  Pt reports he went to [facility], was to receive AV fistula on 6/4 to receive HD.  Pt reports fatigue, and shortness of breath.

## 2012-01-06 NOTE — Progress Notes (Signed)
Pt received phone call during PAT from kidney doctor to report to ED, PAT discontinued pt sent to ED

## 2012-01-06 NOTE — ED Notes (Signed)
The pt talking with a low blood sugar.  cbg 26.  Dextrose 50% 25 ml given.  Pt c/o being cold now.  Warm blankets given .  Npo at present per the edp

## 2012-01-06 NOTE — ED Notes (Signed)
nss added to the saline lok tko

## 2012-01-06 NOTE — ED Provider Notes (Signed)
History     CSN: [id number]  Arrival date & time 01/06/12  1355   First MD Initiated Contact with Patient 01/06/12 1417      No chief complaint on file.   (Consider location/radiation/quality/duration/timing/severity/associated sxs/prior treatment) The history is provided by the patient.   patient was sent in by his nephrologist remission the hospital. Dr. Allena Katz states the patient's renal function is around 10%. He stated the patient was post to see vascular medicine missing some appointments. He states the patient needed admission for access placement and likely dialysis. He he has fatigue and shortness of breath. No cough. No peripheral edema. He does have a peritoneal dialysis catheter, but patient states that they're going to place an arm catheter and a chest catheter.  Past Medical History  Diagnosis Date  . Diabetes mellitus   . Renal disorder   . Hypertension   . Hypercholesterolemia     Past Surgical History  Procedure Date  . Peritoneal catheter insertion     Family History  Problem Relation Age of Onset  . Diabetes Mother     History  Substance Use Topics  . Smoking status: Current Everyday Smoker -- 1.0 packs/day    Types: Cigarettes  . Smokeless tobacco: Never Used  . Alcohol Use: No      Review of Systems  Constitutional: Positive for fatigue. Negative for activity change and appetite change.  HENT: Negative for neck stiffness.   Eyes: Negative for pain.  Respiratory: Positive for shortness of breath. Negative for chest tightness.   Cardiovascular: Negative for chest pain and leg swelling.  Gastrointestinal: Negative for nausea, vomiting, abdominal pain and diarrhea.  Genitourinary: Negative for flank pain.  Musculoskeletal: Negative for back pain.  Skin: Negative for rash.  Neurological: Negative for weakness, numbness and headaches.  Psychiatric/Behavioral: Negative for behavioral problems.    Allergies  Review of patient's allergies indicates  no known allergies.  Home Medications   Current Outpatient Rx  Name Route Sig Dispense Refill  . AMLODIPINE BESYLATE 10 MG PO TABS Oral Take 10 mg by mouth at bedtime.    Marland Kitchen CALCIUM CARBONATE ANTACID 500 MG PO CHEW Oral Chew 1 tablet by mouth 3 (three) times daily.    Marland Kitchen DILTIAZEM HCL ER BEADS 240 MG PO CP24 Oral Take 240 mg by mouth daily.    . INSULIN ASPART PROT & ASPART (70-30) 100 UNIT/ML [place] SUSP Subcutaneous Inject 15-30 Units into the skin 2 (two) times daily with a meal. Inject 30 units in the morning, and inject 15 units at night.    Marland Kitchen ROSUVASTATIN CALCIUM 10 MG PO TABS Oral Take 10 mg by mouth at bedtime.    Marland Kitchen SILDENAFIL CITRATE 50 MG PO TABS Oral Take 50 mg by mouth daily as needed. For E.D.      BP 138/81  Pulse 91  Temp(Src) 98.1 F (36.7 C) (Oral)  Resp 16  Ht 5\' 8"  (1.727 m)  Wt 139 lb (63.05 kg)  BMI 21.13 kg/m2  SpO2 96%  Physical Exam  Nursing note and vitals reviewed. Constitutional: He is oriented to person, place, and time. He appears well-developed and well-nourished.  HENT:  Head: Normocephalic and atraumatic.  Eyes: EOM are normal. Pupils are equal, round, and reactive to light.  Neck: Normal range of motion. Neck supple.  Cardiovascular: Normal rate, regular rhythm and normal heart sounds.   No murmur heard. Pulmonary/Chest: Effort normal and breath sounds normal.  Abdominal: Soft. Bowel sounds are normal. He exhibits no distension  and no mass. There is no tenderness. There is no rebound and no guarding.       Dialysis catheter to abdomen.  Musculoskeletal: Normal range of motion. He exhibits no edema.  Neurological: He is alert and oriented to person, place, and time. No cranial nerve deficit.  Skin: Skin is warm and dry.  Psychiatric: He has a normal mood and affect.    ED Course  Procedures (including critical care time)  Labs Reviewed  CBC - Abnormal; Notable for the following:    WBC 18.1 (*)    RBC 3.61 (*)    Hemoglobin 11.1 (*)    HCT  31.1 (*)    All other components within normal limits  DIFFERENTIAL - Abnormal; Notable for the following:    Neutrophils Relative 88 (*)    Neutro Abs 15.9 (*)    Lymphocytes Relative 6 (*)    All other components within normal limits  BASIC METABOLIC PANEL - Abnormal; Notable for the following:    CO2 17 (*)    Creatinine, Ser 12.72 (*)    Calcium 7.9 (*)    GFR calc non Af Amer 4 (*)    GFR calc Af Amer 5 (*)    All other components within normal limits  PROTIME-INR  APTT  URINALYSIS, ROUTINE W REFLEX MICROSCOPIC   Dg Chest 2 View  01/06/2012  *RADIOLOGY REPORT*  Clinical Data: Weakness, renal disorder, needs dialysis  CHEST - 2 VIEW  Comparison: 11/30/2010  Findings: Very mild patchy bilateral lower lobe opacities, possibly infectious, equivocal. No pleural effusion or pneumothorax.  Cardiomediastinal silhouette is within normal limits.  Visualized osseous structures are within normal limits.  IMPRESSION: Very mild patchy bilateral lower lobe opacities, possibly infectious, equivocal.  Original Report Authenticated By: Charline Bills, M.D.     1. End stage renal disease   2. Leukocytosis       MDM   Patient with end-stage renal failure we'll need to start dialysis. Sent in by nephrology for admission to medicine.   Juliet Rude. Rubin Payor, MD 01/06/12 1540

## 2012-01-06 NOTE — H&P (Signed)
Hospital Admission Note Date: 01/06/2012  Patient name: Mark Miranda           Medical record number: [id number] Date of birth: 1971-03-05           Age: 41 y.o.   Gender: male    PCP:   Jaclyn Shaggy, MD, MD  [facility]  Chief Complaint:  Syndrome his nephrologist office for profound weakness  HPI: Mark Miranda is a 41 y.o. male with past medical history of type 1 diabetes mellitus, hypertension and end-stage renal disease. Patient was sent from Dr. Allena Katz of this as well as a note to start him on hemodialysis. He does have ESRD secondary to diabetes and hypertension, patient had peritoneal dialysis catheter in place, but he was never started on peritoneal dialysis. He was seen today by his nephrologist Dr. Allena Katz, and decision was made to start hemodialysis emergently by placing dialysis catheter. Upon initial evaluation in the emergency department patient has profound uremia with BUN of 116 and creatinine of 12.7. He is not fluid overloaded, he complains about generalized weakness denies any fever or chills, he also have some leukocytosis with neutrophilia, WBC is 18.1.   Past Medical History: Past Medical History  Diagnosis Date  . Diabetes mellitus   . Renal disorder   . Hypertension   . Hypercholesterolemia    Past Surgical History  Procedure Date  . Peritoneal catheter insertion     Medications: Prior to Admission medications   Medication Sig Start Date End Date Taking? Authorizing Provider  amLODipine (NORVASC) 10 MG tablet Take 10 mg by mouth at bedtime.   Yes Historical Provider, MD  calcium carbonate (TUMS - DOSED IN MG ELEMENTAL CALCIUM) 500 MG chewable tablet Chew 1 tablet by mouth 3 (three) times daily.   Yes Historical Provider, MD  diltiazem (TIAZAC) 240 MG 24 hr capsule Take 240 mg by mouth daily.   Yes Historical Provider, MD  insulin aspart protamine-insulin aspart (NOVOLOG 70/30) (70-30) 100 UNIT/ML injection Inject 15-30 Units into the skin 2 (two) times  daily with a meal. Inject 30 units in the morning, and inject 15 units at night.   Yes Historical Provider, MD  rosuvastatin (CRESTOR) 10 MG tablet Take 10 mg by mouth at bedtime.   Yes Historical Provider, MD  sildenafil (VIAGRA) 50 MG tablet Take 50 mg by mouth daily as needed. For E.D.   Yes Historical Provider, MD    Allergies:  No Known Allergies  Social History:  reports that he has been smoking Cigarettes.  He has been smoking about 1 pack per day. He has never used smokeless tobacco. He reports that he does not drink alcohol or use illicit drugs.  Family History: Family History  Problem Relation Age of Onset  . Diabetes Mother     Review of Systems:  Constitutional: negative for anorexia, fevers and sweats Eyes: negative for irritation, redness and visual disturbance Ears, nose, mouth, throat, and face: negative for earaches, epistaxis, nasal congestion and sore throat Respiratory: negative for cough, dyspnea on exertion, sputum and wheezing Cardiovascular: negative for chest pain, dyspnea, lower extremity edema, orthopnea, palpitations and syncope Gastrointestinal: negative for abdominal pain, constipation, diarrhea, melena, nausea and vomiting Genitourinary:negative for dysuria, frequency and hematuria Hematologic/lymphatic: negative for bleeding, easy bruising and lymphadenopathy Musculoskeletal:negative for arthralgias, muscle weakness and stiff joints Neurological: negative for coordination problems, gait problems, headaches and weakness Endocrine: negative for diabetic symptoms including polydipsia, polyuria and weight loss Allergic/Immunologic: negative for anaphylaxis, hay fever and urticaria  Physical Exam:  BP 145/76  Pulse 84  Temp(Src) 98 F (36.7 C) (Oral)  Resp 18  Ht 5\' 8"  (1.727 m)  Wt 63.05 kg (139 lb)  BMI 21.13 kg/m2  SpO2 99% General appearance: alert, cooperative and no distress  Head: Normocephalic, without obvious abnormality, atraumatic  Eyes:  conjunctivae/corneas clear. PERRL, EOM's intact. Fundi benign.  Nose: Nares normal. Septum midline. Mucosa normal. No drainage or sinus tenderness.  Throat: lips, mucosa, and tongue normal; teeth and gums normal  Neck: Supple, no masses, no cervical lymphadenopathy, no JVD appreciated, no meningeal signs Resp: clear to auscultation bilaterally  Chest wall: no tenderness  Cardio: regular rate and rhythm, S1, S2 normal, no murmur, click, rub or gallop  GI: soft, non-tender; bowel sounds normal; no masses, no organomegaly  Extremities: extremities normal, atraumatic, no cyanosis or edema  Skin: Skin color, texture, turgor normal. No rashes or lesions  Neurologic: Alert and oriented X 3, normal strength and tone. Normal symmetric reflexes. Normal coordination and gait  Labs on Admission:   Basename 01/06/12 1433  NA 141  K 4.1  CL 102  CO2 17*  GLUCOSE 34*  BUN 116*  CREATININE 12.72*  CALCIUM 7.9*  MG --  PHOS --   No results found for this basename: AST:2,ALT:2,ALKPHOS:2,BILITOT:2,PROT:2,ALBUMIN:2 in the last 72 hours No results found for this basename: LIPASE:2,AMYLASE:2 in the last 72 hours  Basename 01/06/12 1433  WBC 18.1*  NEUTROABS 15.9*  HGB 11.1*  HCT 31.1*  MCV 86.1  PLT 234   No results found for this basename: CKTOTAL:3,CKMB:3,CKMBINDEX:3,TROPONINI:3 in the last 72 hours No results found for this basename: TSH,T4TOTAL,FREET3,T3FREE,THYROIDAB in the last 72 hours No results found for this basename: VITAMINB12:2,FOLATE:2,FERRITIN:2,TIBC:2,IRON:2,RETICCTPCT:2 in the last 72 hours  Radiological Exams on Admission: Dg Chest 2 View  01/06/2012  *RADIOLOGY REPORT*  Clinical Data: Weakness, renal disorder, needs dialysis  CHEST - 2 VIEW  Comparison: 11/30/2010  Findings: Very mild patchy bilateral lower lobe opacities, possibly infectious, equivocal. No pleural effusion or pneumothorax.  Cardiomediastinal silhouette is within normal limits.  Visualized osseous structures  are within normal limits.  IMPRESSION: Very mild patchy bilateral lower lobe opacities, possibly infectious, equivocal.  Original Report Authenticated By: Charline Bills, M.D.   [place] Misc Soft Tissue  12/22/2011  *RADIOLOGY REPORT*  Clinical Data: Cyst on the right elbow.  ULTRASOUND RIGHT UPPER EXTREMITY COMPLETE  Technique:  Ultrasound examination of the right elbow was performed including evaluation of the muscles, tendons, joint, and adjacent soft tissues.  Comparison:  None.  Findings: There is a 3.7 x 2.3 x 3.0 cm complex fluid collection in the olecranon region of the elbow.  This likely reflects olecranon bursitis complicated by hemorrhage or infection.  IMPRESSION: 3.7 x 2.3 x 3.0 cm complex fluid collection in the olecranon bursa, most likely hemorrhagic olecranon bursitis.  Infection would also be possible.  Aspiration is recommended.  Original Report Authenticated By: P. Loralie Champagne, M.D.     IMPRESSION: Present on Admission:  .Leukocytosis .DM (diabetes mellitus) type I uncontrolled with renal manifestation .End stage renal disease .HTN (hypertension) .Hypoglycemia .Uremia  Assessment/Plan  ESRD Patient to be initiated on hemodialysis, as his ESRD getting progressively worse, permacath HD access to be placed in the morning by interventional radiology, nephrology is aware about the patient.   Leukocytosis With marked neutrophilia, upon review of the records patient does have chronic elevation of his neutrophils/WBC. He denies any recent fever, cough or urinary symptoms. I'll followup closely with a CBC in the morning. I will  defer starting antibiotics for now.  Hypoglycemia  Patient said he took his insulin this morning, patient is on NovoLog 70/30 mix with 30 units in the morning and 15 at p.m., patient developed severe hypoglycemia with CBG down to 26 while he was in the emergency department. This is likely secondary to poor oral intake, he missed his lunch. In addition to  the progression of his ESRD which makes the insulin tends to linger in his system. Patient place him about he placed also on D5W, we will discontinue Celesta Gentile. CBGs more than 100.   Diabetes mellitus type 1 Patient is on NovoLog 70/30 mix, takes 30 units in the morning and 15 units at p.m. This is likely the reason for his ESRD. Along with hypertension.   Hypertension Continue preadmission oral medications.   Mark Miranda A 01/06/2012, 4:51 PM

## 2012-01-06 NOTE — ED Notes (Signed)
Patient transported to X-ray 

## 2012-01-06 NOTE — ED Notes (Signed)
cbg is 91. Informed chris/rn.

## 2012-01-06 NOTE — ED Notes (Signed)
Food given for falling blood sugar.  He is a smoker and wants to go outside to smoke

## 2012-01-07 DIAGNOSIS — N186 End stage renal disease: Secondary | ICD-10-CM

## 2012-01-07 DIAGNOSIS — D7289 Other specified disorders of white blood cells: Secondary | ICD-10-CM

## 2012-01-07 DIAGNOSIS — N19 Unspecified kidney failure: Secondary | ICD-10-CM

## 2012-01-07 DIAGNOSIS — E1029 Type 1 diabetes mellitus with other diabetic kidney complication: Secondary | ICD-10-CM

## 2012-01-07 DIAGNOSIS — E1069 Type 1 diabetes mellitus with other specified complication: Secondary | ICD-10-CM

## 2012-01-07 LAB — CBC
HCT: 28.5 % — ABNORMAL LOW (ref 39.0–52.0)
MCH: 29.7 pg (ref 26.0–34.0)
MCV: 86.4 fL (ref 78.0–100.0)
RBC: 3.3 MIL/uL — ABNORMAL LOW (ref 4.22–5.81)
WBC: 10 10*3/uL (ref 4.0–10.5)

## 2012-01-07 LAB — COMPREHENSIVE METABOLIC PANEL
ALT: 6 U/L (ref 0–53)
AST: 7 U/L (ref 0–37)
Albumin: 3.3 g/dL — ABNORMAL LOW (ref 3.5–5.2)
Alkaline Phosphatase: 118 U/L — ABNORMAL HIGH (ref 39–117)
Chloride: 102 mEq/L (ref 96–112)
Potassium: 4.1 mEq/L (ref 3.5–5.1)
Total Bilirubin: 0.2 mg/dL — ABNORMAL LOW (ref 0.3–1.2)

## 2012-01-07 LAB — GLUCOSE, CAPILLARY
Glucose-Capillary: 350 mg/dL — ABNORMAL HIGH (ref 70–99)
Glucose-Capillary: 477 mg/dL — ABNORMAL HIGH (ref 70–99)
Glucose-Capillary: 373 mg/dL — ABNORMAL HIGH (ref 70–99)

## 2012-01-07 MED ORDER — INSULIN ASPART 100 UNIT/ML ~~LOC~~ SOLN
6.0000 [IU] | Freq: Once | SUBCUTANEOUS | Status: AC
  Administered 2012-01-07: 6 [IU] via SUBCUTANEOUS

## 2012-01-07 MED ORDER — INSULIN ASPART 100 UNIT/ML ~~LOC~~ SOLN
0.0000 [IU] | Freq: Every day | SUBCUTANEOUS | Status: DC
  Administered 2012-01-07: 5 [IU] via SUBCUTANEOUS
  Administered 2012-01-08: 4 [IU] via SUBCUTANEOUS
  Administered 2012-01-11: 3 [IU] via SUBCUTANEOUS

## 2012-01-07 MED ORDER — SODIUM BICARBONATE 650 MG PO TABS
1300.0000 mg | ORAL_TABLET | Freq: Two times a day (BID) | ORAL | Status: DC
  Administered 2012-01-07 – 2012-01-13 (×12): 1300 mg via ORAL
  Filled 2012-01-07 (×21): qty 2

## 2012-01-07 MED ORDER — INSULIN ASPART 100 UNIT/ML ~~LOC~~ SOLN
0.0000 [IU] | Freq: Three times a day (TID) | SUBCUTANEOUS | Status: DC
  Administered 2012-01-08: 5 [IU] via SUBCUTANEOUS
  Administered 2012-01-08: 2 [IU] via SUBCUTANEOUS
  Administered 2012-01-09: 5 [IU] via SUBCUTANEOUS
  Administered 2012-01-09 (×2): 2 [IU] via SUBCUTANEOUS
  Administered 2012-01-10: 9 [IU] via SUBCUTANEOUS
  Administered 2012-01-11: 2 [IU] via SUBCUTANEOUS
  Administered 2012-01-11: 3 [IU] via SUBCUTANEOUS
  Administered 2012-01-12: 5 [IU] via SUBCUTANEOUS
  Administered 2012-01-12: 2 [IU] via SUBCUTANEOUS
  Administered 2012-01-13: 7 [IU] via SUBCUTANEOUS
  Administered 2012-01-13: 2 [IU] via SUBCUTANEOUS
  Administered 2012-01-13: 3 [IU] via SUBCUTANEOUS
  Administered 2012-01-14 (×2): 10 [IU] via SUBCUTANEOUS

## 2012-01-07 MED ORDER — INSULIN ASPART PROT & ASPART (70-30 MIX) 100 UNIT/ML ~~LOC~~ SUSP: 15.0000 [IU] | Freq: Two times a day (BID) | SUBCUTANEOUS | Status: DC

## 2012-01-07 MED ORDER — INSULIN ASPART PROT & ASPART (70-30 MIX) 100 UNIT/ML ~~LOC~~ SUSP
30.0000 [IU] | Freq: Every day | SUBCUTANEOUS | Status: DC
  Administered 2012-01-08 – 2012-01-11 (×3): 30 [IU] via SUBCUTANEOUS
  Filled 2012-01-07: qty 3

## 2012-01-07 MED ORDER — INSULIN ASPART PROT & ASPART (70-30 MIX) 100 UNIT/ML ~~LOC~~ SUSP
15.0000 [IU] | Freq: Every day | SUBCUTANEOUS | Status: DC
  Administered 2012-01-07 – 2012-01-10 (×4): 15 [IU] via SUBCUTANEOUS
  Filled 2012-01-07: qty 3

## 2012-01-07 NOTE — Consult Note (Signed)
Reason for Consult:access PD catheter Referring Physician: Mohammedali Bedoy is an 41 y.o. male.  HPI: 41yo male with ESRD, HTN, DM admitted because of declining renal function. Pt had [place] PD catheter placed by Dr Zachery Dakins on 11/30/10. Pt was not dialysis dependent at that time so Dr Zachery Dakins buried the catheter in the subcutaneous tissue in the abdominal wall with plans to exteriorize it later.  Pt denies fevers, chills, nausea, vomiting. +weakness, fatigue  Past Medical History  Diagnosis Date  . Diabetes mellitus   . Renal disorder   . Hypertension   . Hypercholesterolemia     Past Surgical History  Procedure Date  . Peritoneal catheter insertion     Family History  Problem Relation Age of Onset  . Diabetes Mother     Social History:  reports that he has been smoking Cigarettes.  He has been smoking about 1 pack per day. He has never used smokeless tobacco. He reports that he does not drink alcohol or use illicit drugs.  Allergies: No Known Allergies  Medications: I have reviewed the patient's current medications.  Results for orders placed during the hospital encounter of 01/06/12 (from the past 48 hour(s))  CBC     Status: Abnormal   Collection Time   01/06/12  2:33 PM      Component Value Range Comment   WBC 18.1 (*) 4.0 - 10.5 (K/uL)    RBC 3.61 (*) 4.22 - 5.81 (MIL/uL)    Hemoglobin 11.1 (*) 13.0 - 17.0 (g/dL)    HCT 86.5 (*) 78.4 - 52.0 (%)    MCV 86.1  78.0 - 100.0 (fL)    MCH 30.7  26.0 - 34.0 (pg)    MCHC 35.7  30.0 - 36.0 (g/dL)    RDW 69.6  29.5 - 28.4 (%)    Platelets 234  150 - 400 (K/uL)   DIFFERENTIAL     Status: Abnormal   Collection Time   01/06/12  2:33 PM      Component Value Range Comment   Neutrophils Relative 88 (*) 43 - 77 (%)    Neutro Abs 15.9 (*) 1.7 - 7.7 (K/uL)    Lymphocytes Relative 6 (*) 12 - 46 (%)    Lymphs Abs 1.2  0.7 - 4.0 (K/uL)    Monocytes Relative 4  3 - 12 (%)    Monocytes Absolute 0.8  0.1 - 1.0  (K/uL)    Eosinophils Relative 1  0 - 5 (%)    Eosinophils Absolute 0.2  0.0 - 0.7 (K/uL)    Basophils Relative 0  0 - 1 (%)    Basophils Absolute 0.0  0.0 - 0.1 (K/uL)   PROTIME-INR     Status: Normal   Collection Time   01/06/12  2:33 PM      Component Value Range Comment   Prothrombin Time 13.7  11.6 - 15.2 (seconds)    INR 1.03  0.00 - 1.49    APTT     Status: Normal   Collection Time   01/06/12  2:33 PM      Component Value Range Comment   aPTT 28  24 - 37 (seconds)   BASIC METABOLIC PANEL     Status: Abnormal   Collection Time   01/06/12  2:33 PM      Component Value Range Comment   Sodium 141  135 - 145 (mEq/L)    Potassium 4.1  3.5 - 5.1 (mEq/L)    Chloride 102  96 -  112 (mEq/L)    CO2 17 (*) 19 - 32 (mEq/L)    Glucose, Bld 34 (*) 70 - 99 (mg/dL)    BUN 161 (*) 6 - 23 (mg/dL)    Creatinine, Ser 09.60 (*) 0.50 - 1.35 (mg/dL)    Calcium 7.9 (*) 8.4 - 10.5 (mg/dL)    GFR calc non Af Amer 4 (*) >90 (mL/min)    GFR calc Af Amer 5 (*) >90 (mL/min)   URINALYSIS, ROUTINE W REFLEX MICROSCOPIC     Status: Abnormal   Collection Time   01/06/12  3:34 PM      Component Value Range Comment   Color, Urine YELLOW  YELLOW     APPearance CLEAR  CLEAR     Specific Gravity, Urine 1.014  1.005 - 1.030     pH 5.0  5.0 - 8.0     Glucose, UA 100 (*) NEGATIVE (mg/dL)    Hgb urine dipstick SMALL (*) NEGATIVE     Bilirubin Urine NEGATIVE  NEGATIVE     Ketones, ur NEGATIVE  NEGATIVE (mg/dL)    Protein, ur >454 (*) NEGATIVE (mg/dL)    Urobilinogen, UA 0.2  0.0 - 1.0 (mg/dL)    Nitrite NEGATIVE  NEGATIVE     Leukocytes, UA TRACE (*) NEGATIVE    URINE MICROSCOPIC-ADD ON     Status: Abnormal   Collection Time   01/06/12  3:34 PM      Component Value Range Comment   Squamous Epithelial / LPF RARE  RARE     WBC, UA 0-2  <3 (WBC/hpf)    RBC / HPF 3-6  <3 (RBC/hpf)    Casts GRANULAR CAST (*) NEGATIVE     Urine-Other AMORPHOUS URATES/PHOSPHATES     GLUCOSE, CAPILLARY     Status: Abnormal    Collection Time   01/06/12  3:45 PM      Component Value Range Comment   Glucose-Capillary 26 (*) 70 - 99 (mg/dL)    Comment 1 Documented in Chart      Comment 2 Notify RN     GLUCOSE, CAPILLARY     Status: Normal   Collection Time   01/06/12  4:22 PM      Component Value Range Comment   Glucose-Capillary 91  70 - 99 (mg/dL)   GLUCOSE, CAPILLARY     Status: Abnormal   Collection Time   01/06/12  5:39 PM      Component Value Range Comment   Glucose-Capillary 104 (*) 70 - 99 (mg/dL)   GLUCOSE, CAPILLARY     Status: Abnormal   Collection Time   01/06/12  6:31 PM      Component Value Range Comment   Glucose-Capillary 134 (*) 70 - 99 (mg/dL)   GLUCOSE, CAPILLARY     Status: Abnormal   Collection Time   01/06/12  7:39 PM      Component Value Range Comment   Glucose-Capillary 142 (*) 70 - 99 (mg/dL)   GLUCOSE, CAPILLARY     Status: Abnormal   Collection Time   01/06/12  8:27 PM      Component Value Range Comment   Glucose-Capillary 168 (*) 70 - 99 (mg/dL)    Comment 1 Notify RN      Comment 2 Documented in Chart     HEMOGLOBIN A1C     Status: Abnormal   Collection Time   01/06/12  9:18 PM      Component Value Range Comment   Hemoglobin A1C 5.8 (*) <5.7 (%)  Mean Plasma Glucose 120 (*) <117 (mg/dL)   GLUCOSE, CAPILLARY     Status: Abnormal   Collection Time   01/06/12 10:29 PM      Component Value Range Comment   Glucose-Capillary 207 (*) 70 - 99 (mg/dL)    Comment 1 Notify RN      Comment 2 Documented in Chart     CBC     Status: Abnormal   Collection Time   01/07/12  5:50 AM      Component Value Range Comment   WBC 10.0  4.0 - 10.5 (K/uL)    RBC 3.30 (*) 4.22 - 5.81 (MIL/uL)    Hemoglobin 9.8 (*) 13.0 - 17.0 (g/dL)    HCT 16.1 (*) 09.6 - 52.0 (%)    MCV 86.4  78.0 - 100.0 (fL)    MCH 29.7  26.0 - 34.0 (pg)    MCHC 34.4  30.0 - 36.0 (g/dL)    RDW 04.5  40.9 - 81.1 (%)    Platelets 202  150 - 400 (K/uL)   COMPREHENSIVE METABOLIC PANEL     Status: Abnormal   Collection Time    01/07/12  5:50 AM      Component Value Range Comment   Sodium 139  135 - 145 (mEq/L)    Potassium 4.1  3.5 - 5.1 (mEq/L)    Chloride 102  96 - 112 (mEq/L)    CO2 18 (*) 19 - 32 (mEq/L)    Glucose, Bld 271 (*) 70 - 99 (mg/dL)    BUN 914 (*) 6 - 23 (mg/dL)    Creatinine, Ser 78.29 (*) 0.50 - 1.35 (mg/dL)    Calcium 7.6 (*) 8.4 - 10.5 (mg/dL)    Total Protein 6.7  6.0 - 8.3 (g/dL)    Albumin 3.3 (*) 3.5 - 5.2 (g/dL)    AST 7  0 - 37 (U/L)    ALT 6  0 - 53 (U/L)    Alkaline Phosphatase 118 (*) 39 - 117 (U/L)    Total Bilirubin 0.2 (*) 0.3 - 1.2 (mg/dL)    GFR calc non Af Amer 4 (*) >90 (mL/min)    GFR calc Af Amer 5 (*) >90 (mL/min)   GLUCOSE, CAPILLARY     Status: Abnormal   Collection Time   01/07/12  7:44 AM      Component Value Range Comment   Glucose-Capillary 350 (*) 70 - 99 (mg/dL)     Dg Chest 2 View  5/62/1308  *RADIOLOGY REPORT*  Clinical Data: Weakness, renal disorder, needs dialysis  CHEST - 2 VIEW  Comparison: 11/30/2010  Findings: Very mild patchy bilateral lower lobe opacities, possibly infectious, equivocal. No pleural effusion or pneumothorax.  Cardiomediastinal silhouette is within normal limits.  Visualized osseous structures are within normal limits.  IMPRESSION: Very mild patchy bilateral lower lobe opacities, possibly infectious, equivocal.  Original Report Authenticated By: Charline Bills, M.D.    Review of Systems  Constitutional: Positive for malaise/fatigue. Negative for fever, chills and diaphoresis.  Eyes: Negative for double vision.  Respiratory: Negative for shortness of breath.   Cardiovascular: Negative for chest pain.  Gastrointestinal: Negative for nausea, vomiting, abdominal pain and diarrhea.  Musculoskeletal:       Recent fall a couple of weeks ago; has subcu fluid collection around rt elbow  Neurological: Positive for weakness. Negative for seizures and loss of consciousness.  Psychiatric/Behavioral: Negative for suicidal ideas.   Blood  pressure 141/70, pulse 83, temperature 98.5 F (36.9 C), temperature source Oral,  resp. rate 20, height 5\' 8"  (1.727 m), weight 139 lb (63.05 kg), SpO2 97.00%. Physical Exam  Vitals reviewed. Constitutional: He is oriented to person, place, and time. He appears well-developed and well-nourished. No distress.       Eating lunch  HENT:  Head: Normocephalic and atraumatic.  Right Ear: External ear normal.  Left Ear: External ear normal.  Eyes: Conjunctivae are normal. No scleral icterus.  Neck: No tracheal deviation present.  Cardiovascular: Normal rate.   Respiratory: Effort normal. No stridor. No respiratory distress.  GI: Soft. He exhibits no distension. There is no tenderness.         Palpable tubing in RLQ abd wall subcu tissue  Musculoskeletal: Normal range of motion.       Right elbow: He exhibits normal range of motion. no tenderness found.       Well circumscribed subcu fluid collection over rt elbow. No cellulitis. No warmth. Nontender. About 2.5 x 2.5 cm  Neurological: He is alert and oriented to person, place, and time.  Skin: Skin is warm and dry. He is not diaphoretic.  Psychiatric: He has a normal mood and affect. His behavior is normal. Judgment and thought content normal.    Assessment/Plan: Patient Active Problem List  Diagnoses  . HTN (hypertension)  . 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 Kidney disease or stones  . End stage renal disease  . Leukocytosis  . Hypoglycemia  . Uremia   Pt now in need of dialysis and need PD catheter exteriorized. I do not do this type of surgery nor do any of our on-call surgeons for the weekend. Procedure would need to be done in OR under local or local/MAC.  The soonest it may be able to be done would be Monday. I will contact the surgeons in our group who do this type of procedure and see if and when they are available to perform procedure. If pt in urgent need of dialysis, he will need a  temporary line placed.   rec ortho consult for elbow evaluation.  Mary Sella. Andrey Campanile, MD, FACS General, Bariatric, & Minimally Invasive Surgery [facility], [place]   [facility] M 01/07/2012, 12:37 PM

## 2012-01-07 NOTE — Progress Notes (Signed)
Dr. Arthor Captain informed patient blood sugar is 477, no serum lab ordered.

## 2012-01-07 NOTE — Progress Notes (Signed)
DAILY PROGRESS NOTE                              [facility]  SUBJECTIVE: Denies any complaint, no events overnight  OBJECTIVE: BP 141/70  Pulse 83  Temp(Src) 98.5 F (36.9 C) (Oral)  Resp 20  Ht 5\' 8"  (1.727 m)  Wt 63.05 kg (139 lb)  BMI 21.13 kg/m2  SpO2 97%  Intake/Output Summary (Last 24 hours) at 01/07/12 1139 Last data filed at 01/07/12 0900  Gross per 24 hour  Intake 218.33 ml  Output      0 ml  Net 218.33 ml                      Weight change:  Physical Exam: General: Alert and awake oriented x3 not in any acute distress. HEENT: anicteric sclera, pupils equal reactive to light and accommodation CVS: S1-S2 heard, no murmur rubs or gallops Chest: clear to auscultation bilaterally, no wheezing rales or rhonchi Abdomen:  normal bowel sounds, soft, nontender, nondistended, no organomegaly Neuro: Cranial nerves II-XII intact, no focal neurological deficits Extremities: no cyanosis, no clubbing or edema noted bilaterally   Lab Results:  Basename 01/07/12 0550 01/06/12 1433  NA 139 141  K 4.1 4.1  CL 102 102  CO2 18* 17*  GLUCOSE 271* 34*  BUN 114* 116*  CREATININE 12.69* 12.72*  CALCIUM 7.6* 7.9*  MG -- --  PHOS -- --    Basename 01/07/12 0550  AST 7  ALT 6  ALKPHOS 118*  BILITOT 0.2*  PROT 6.7  ALBUMIN 3.3*   No results found for this basename: LIPASE:2,AMYLASE:2 in the last 72 hours  Basename 01/07/12 0550 01/06/12 1433  WBC 10.0 18.1*  NEUTROABS -- 15.9*  HGB 9.8* 11.1*  HCT 28.5* 31.1*  MCV 86.4 86.1  PLT 202 234   No results found for this basename: CKTOTAL:3,CKMB:3,CKMBINDEX:3,TROPONINI:3 in the last 72 hours No components found with this basename: POCBNP:3 No results found for this basename: DDIMER:2 in the last 72 hours  Basename 01/06/12 2118  HGBA1C 5.8*   No results found for this basename: CHOL:2,HDL:2,LDLCALC:2,TRIG:2,CHOLHDL:2,LDLDIRECT:2 in the last 72 hours No results found for this basename:  TSH,T4TOTAL,FREET3,T3FREE,THYROIDAB in the last 72 hours No results found for this basename: VITAMINB12:2,FOLATE:2,FERRITIN:2,TIBC:2,IRON:2,RETICCTPCT:2 in the last 72 hours  Micro Results: Recent Results (from the past 240 hour(s))  SURGICAL PCR SCREEN     Status: Normal   Collection Time   01/06/12  1:44 PM      Component Value Range Status Comment   MRSA, PCR NEGATIVE  NEGATIVE  Final    Staphylococcus aureus NEGATIVE  NEGATIVE  Final     Studies/Results: Dg Chest 2 View  01/06/2012  *RADIOLOGY REPORT*  Clinical Data: Weakness, renal disorder, needs dialysis  CHEST - 2 VIEW  Comparison: 11/30/2010  Findings: Very mild patchy bilateral lower lobe opacities, possibly infectious, equivocal. No pleural effusion or pneumothorax.  Cardiomediastinal silhouette is within normal limits.  Visualized osseous structures are within normal limits.  IMPRESSION: Very mild patchy bilateral lower lobe opacities, possibly infectious, equivocal.  Original Report Authenticated By: Charline Bills, M.D.   [place] Misc Soft Tissue  12/22/2011  *RADIOLOGY REPORT*  Clinical Data: Cyst on the right elbow.  ULTRASOUND RIGHT UPPER EXTREMITY COMPLETE  Technique:  Ultrasound examination of the right elbow was performed including evaluation of the muscles, tendons, joint, and adjacent soft tissues.  Comparison:  None.  Findings: There is a 3.7 x 2.3 x 3.0 cm complex fluid collection in the olecranon region of the elbow.  This likely reflects olecranon bursitis complicated by hemorrhage or infection.  IMPRESSION: 3.7 x 2.3 x 3.0 cm complex fluid collection in the olecranon bursa, most likely hemorrhagic olecranon bursitis.  Infection would also be possible.  Aspiration is recommended.  Original Report Authenticated By: P. Loralie Champagne, M.D.   Medications: Scheduled Meds:   . amLODipine  10 mg Oral QHS  . atorvastatin  20 mg Oral q1800  . calcium carbonate  1 tablet Oral TID  .  ceFAZolin (ANCEF) IV  1 g Intravenous On  Call  .  ceFAZolin (ANCEF) IV  1 g Intravenous Once  . dextrose   Intravenous Once  . dextrose  25 g Intravenous Once  . diltiazem  240 mg Oral Daily  . insulin aspart  0-9 Units Subcutaneous TID WC  . nicotine  21 mg Transdermal Once  . sodium bicarbonate  1,300 mg Oral BID   Continuous Infusions:   . sodium chloride 20 mL/hr at 01/07/12 0700  . DISCONTD: dextrose     PRN Meds:.acetaminophen, acetaminophen, HYDROcodone-acetaminophen, morphine injection, ondansetron (ZOFRAN) IV, ondansetron  ASSESSMENT & PLAN: Principal Problem:  *End stage renal disease Active Problems:  HTN (hypertension)  DM (diabetes mellitus) type I uncontrolled with renal manifestation  Leukocytosis  Hypoglycemia  Uremia   ESRD -Patient was sent from his nephrologist office to initiate hemodialysis. -After discussion with his nephrologist, decision was made to start peritoneal dialysis using his PD catheter -I will call general surgery to re-access his peritoneal dialysis catheter.  Leukocytosis -With marked neutrophilia, but this seems chronic and stable. -No signs of infections.  Diabetes mellitus type 1 -Has episode of severe hypoglycemia yesterday secondary to poor oral intake. -His insulin was held yesterday, we'll restarted today as he was restarted on diet.  Hypertension -Controlled, continue preadmission medications   LOS: 1 day   Tyrica Afzal A 01/07/2012, 11:39 AM

## 2012-01-07 NOTE — Consult Note (Signed)
Referring Provider: No ref. provider found Primary Care Physician:  Jaclyn Shaggy, MD, MD Primary Nephrologist:  Dr.   Jaquita Rector for Consultation:  CKD 5  HPI: Mark Miranda is a 41 y.o. male with past medical history of type 1 diabetes mellitus, hypertension and end-stage renal disease. Patient was sent from Dr. Allena Katz of this as well as a note to start him on hemodialysis. He does have ESRD secondary to diabetes and hypertension, patient had peritoneal dialysis catheter in place, but he was never started on peritoneal dialysis due to insurance related problems   Past Medical History  Diagnosis Date  . Diabetes mellitus   . Renal disorder   . Hypertension   . Hypercholesterolemia     Past Surgical History  Procedure Date  . Peritoneal catheter insertion     Prior to Admission medications   Medication Sig Start Date End Date Taking? Authorizing Provider  amLODipine (NORVASC) 10 MG tablet Take 10 mg by mouth at bedtime.   Yes Historical Provider, MD  calcium carbonate (TUMS - DOSED IN MG ELEMENTAL CALCIUM) 500 MG chewable tablet Chew 1 tablet by mouth 3 (three) times daily.   Yes Historical Provider, MD  diltiazem (TIAZAC) 240 MG 24 hr capsule Take 240 mg by mouth daily.   Yes Historical Provider, MD  insulin aspart protamine-insulin aspart (NOVOLOG 70/30) (70-30) 100 UNIT/ML injection Inject 15-30 Units into the skin 2 (two) times daily with a meal. Inject 30 units in the morning, and inject 15 units at night.   Yes Historical Provider, MD  rosuvastatin (CRESTOR) 10 MG tablet Take 10 mg by mouth at bedtime.   Yes Historical Provider, MD  sildenafil (VIAGRA) 50 MG tablet Take 50 mg by mouth daily as needed. For E.D.   Yes Historical Provider, MD    Current Facility-Administered Medications  Medication Dose Route Frequency Provider Last Rate Last Dose  . 0.9 %  sodium chloride infusion   Intravenous Continuous Sherren Kerns, MD 20 mL/hr at 01/07/12 0700    . acetaminophen (TYLENOL)  tablet 650 mg  650 mg Oral Q6H PRN Clydia Llano, MD       Or  . acetaminophen (TYLENOL) suppository 650 mg  650 mg Rectal Q6H PRN Clydia Llano, MD      . amLODipine (NORVASC) tablet 10 mg  10 mg Oral QHS Clydia Llano, MD   10 mg at 01/06/12 2252  . atorvastatin (LIPITOR) tablet 20 mg  20 mg Oral q1800 Clydia Llano, MD      . calcium carbonate (TUMS - dosed in mg elemental calcium) chewable tablet 200 mg of elemental calcium  1 tablet Oral TID Clydia Llano, MD   200 mg of elemental calcium at 01/06/12 2252  . ceFAZolin (ANCEF) IVPB 1 g/50 mL premix  1 g Intravenous On Call D Kevin Allred, PA      . ceFAZolin (ANCEF) IVPB 1 g/50 mL premix  1 g Intravenous Once Sherren Kerns, MD      . dextrose 5 % solution   Intravenous Once Harrold Donath R. Pickering, MD      . dextrose 50 % solution 25 g  25 g Intravenous Once [employer]. Pickering, MD   25 g at 01/06/12 1553  . diltiazem (TIAZAC) 24 hr capsule 240 mg  240 mg Oral Daily Clydia Llano, MD      . HYDROcodone-acetaminophen (NORCO) 5-325 MG per tablet 1-2 tablet  1-2 tablet Oral Q4H PRN Clydia Llano, MD      . insulin  aspart (novoLOG) injection 0-9 Units  0-9 Units Subcutaneous TID WC Clydia Llano, MD   7 Units at 01/07/12 0914  . morphine 2 MG/ML injection 1 mg  1 mg Intravenous Q4H PRN Clydia Llano, MD      . nicotine (NICODERM CQ - dosed in mg/24 hours) patch 21 mg  21 mg Transdermal Once [employer]. Pickering, MD   21 mg at 01/06/12 1748  . ondansetron (ZOFRAN) tablet 4 mg  4 mg Oral Q6H PRN Clydia Llano, MD       Or  . ondansetron (ZOFRAN) injection 4 mg  4 mg Intravenous Q6H PRN Clydia Llano, MD      . DISCONTD: dextrose 5 % solution   Intravenous Continuous Clydia Llano, MD        Allergies as of 01/06/2012  . (No Known Allergies)    Family History  Problem Relation Age of Onset  . Diabetes Mother     History   Social History  . Marital Status: Married    Spouse Name: N/A    Number of Children: N/A  . Years of Education: N/A    Occupational History  . Not on file.   Social History Main Topics  . Smoking status: Current Everyday Smoker -- 1.0 packs/day    Types: Cigarettes  . Smokeless tobacco: Never Used  . Alcohol Use: No  . Drug Use: No  . Sexually Active: Not on file   Other Topics Concern  . Not on file   Social History Narrative  . No narrative on file    Review of Systems: Gen: General fatigue HEENT: No visual complaints, No history of Retinopathy. Normal external appearance No Epistaxis or Sore throat. No sinusitis.   CV: Denies chest pain, angina, palpitations, syncope, orthopnea, PND, peripheral edema, and claudication. Resp: Denies dyspnea at rest, dyspnea with exercise, cough, sputum, wheezing, coughing up blood, and pleurisy. GI: Denies vomiting blood, jaundice, and fecal incontinence.   Denies dysphagia or odynophagia. GU : Denies urinary burning, blood in urine, urinary frequency, urinary hesitancy, nocturnal urination, and urinary incontinence.  No renal calculi.Diabetic Nephropathy MS: Denies joint pain, limitation of movement, and swelling, stiffness, low back pain, extremity pain. Denies muscle weakness, cramps, atrophy.  No use of non steroidal antiinflammatory drugs. Derm: Denies rash, itching, dry skin, hives, moles, warts, or unhealing ulcers.  Psych: Denies depression, anxiety, memory loss, suicidal ideation, hallucinations, paranoia, and confusion. Heme: Denies bruising, bleeding, and enlarged lymph nodes. Neuro: No headache.  No diplopia. No dysarthria.  No dysphasia.  No history of CVA.  No Seizures. No paresthesias.  No weakness. Endocrine  DM.  No Thyroid disease.  No Adrenal disease.  Physical Exam: Vital signs in last 24 hours: Temp:  [97.3 F (36.3 C)-98.6 F (37 C)] 98.5 F (36.9 C) (06/01 0915) Pulse Rate:  [74-91] 83  (06/01 0915) Resp:  [16-20] 20  (06/01 0915) BP: (120-164)/(70-91) 141/70 mmHg (06/01 0915) SpO2:  [96 %-100 %] 97 % (06/01 0915) Weight:  [62.5  kg (137 lb 12.6 oz)-63.05 kg (139 lb)] 63.05 kg (139 lb) (05/31 2030) Last BM Date: 01/06/12 General:   Alert,  Well-developed, well-nourished, pleasant and cooperative in NAD Head:  Normocephalic and atraumatic. Eyes:  Sclera clear, no icterus.   Conjunctiva pink. periorbital edema Ears:  Normal auditory acuity. Nose:  No deformity, discharge,  or lesions. Mouth:  No deformity or lesions, dentition normal. Neck:  Supple; no masses or thyromegaly. JVP not elevated Lungs:  Clear throughout to auscultation.  No wheezes, crackles, or rhonchi. No acute distress. Heart:  Regular rate and rhythm; no murmurs, clicks, rubs,  or gallops. Abdomen:  Soft, nontender and nondistended. No masses, hepatosplenomegaly or hernias noted. Normal bowel sounds, without guarding, and without rebound.  Peritoneal catheter buried Msk:  Symmetrical without gross deformities. Normal posture. Pulses:  No carotid, renal, femoral bruits. DP and PT symmetrical and equal Extremities:  Without clubbing or edema. Neurologic:  Alert and  oriented x4;  grossly normal neurologically. Skin:  Intact without significant lesions or rashes. Cervical Nodes:  No significant cervical adenopathy. Psych:  Alert and cooperative. Normal mood and affect.  Intake/Output from previous day: 05/31 0701 - 06/01 0700 In: 218.3 [I.V.:218.3] Out: -  Intake/Output this shift:    Lab Results:  Basename 01/07/12 0550 01/06/12 1433  WBC 10.0 18.1*  HGB 9.8* 11.1*  HCT 28.5* 31.1*  PLT 202 234   BMET  Basename 01/07/12 0550 01/06/12 1433  NA 139 141  K 4.1 4.1  CL 102 102  CO2 18* 17*  GLUCOSE 271* 34*  BUN 114* 116*  CREATININE 12.69* 12.72*  CALCIUM 7.6* 7.9*  PHOS -- --   LFT  Basename 01/07/12 0550  PROT 6.7  ALBUMIN 3.3*  AST 7  ALT 6  ALKPHOS 118*  BILITOT 0.2*  BILIDIR --  IBILI --   PT/INR  Basename 01/06/12 1433  LABPROT 13.7  INR 1.03   Hepatitis Panel No results found for this basename:  HEPBSAG,HCVAB,HEPAIGM,HEPBIGM in the last 72 hours  Studies/Results: Dg Chest 2 View  01/06/2012  *RADIOLOGY REPORT*  Clinical Data: Weakness, renal disorder, needs dialysis  CHEST - 2 VIEW  Comparison: 11/30/2010  Findings: Very mild patchy bilateral lower lobe opacities, possibly infectious, equivocal. No pleural effusion or pneumothorax.  Cardiomediastinal silhouette is within normal limits.  Visualized osseous structures are within normal limits.  IMPRESSION: Very mild patchy bilateral lower lobe opacities, possibly infectious, equivocal.  Original Report Authenticated By: Charline Bills, M.D.    Assessment/Plan:  Mark Miranda is a 41 y.o. male with past medical history of type 1 diabetes mellitus, hypertension and end-stage renal disease.  1.ESRD issues surrounding medicaid and disability. Is being prepped for peritoneal dialysis and patient has elected to choose PD as an option 2.Anemia will follow 3.Bone renal diet and binder 4.HTN controlled  Patient should have his PD Catheter exteriorized and start PD. I have discussed with Dr Arthor Captain and will consult surgeon   LOS: 1 Mark Miranda @TODAY @10 :11 AM

## 2012-01-07 NOTE — Progress Notes (Signed)
Patient has many concerns about the financial aspect of getting the dialysis catheter placed and eventually having to be on dialysis.  He is concerned about if his disability has qualified him for Medicaid/Medicare to help in covering the cost of this endeavor.  Social Work consult has been placed.    He has also expressed concern about having had a PD catheter placed in April 2012 that was never used and to now have to have 2 more procedures to place a catheter and fistula creation.  Wants to know why his first option, PD cath, was never used.  Will pass this along.   Consent for procedure is filled out but has not been signed by the patient.

## 2012-01-07 NOTE — Progress Notes (Signed)
Clinical Social Work Department BRIEF PSYCHOSOCIAL ASSESSMENT 01/07/2012  Patient:  Mark Miranda, Mark Miranda     Account Number:  [id number]     Admit date:  01/06/2012  Clinical Social Worker:  Juliette Mangle  Date/Time:  01/07/2012 02:45 PM  Referred by:  Physician  Date Referred:  01/07/2012 Referred for  Other - See comment   Other Referral:   Financial concerns- Medicaid   Interview type:  Patient Other interview type:    PSYCHOSOCIAL DATA Living Status:  FAMILY Admitted from facility:   Level of care:   Primary support name:  Renold Don Primary support relationship to patient:  SPOUSE Degree of support available:   Good    CURRENT CONCERNS Current Concerns  Financial Resources   Other Concerns:    SOCIAL WORK ASSESSMENT / PLAN CSW received a referral to speak to patient about medicaid and disability. CSW met with patient and patient's children at bedside. CSW introduced self, explained role and discussed patient's financial concerns.  Patient reported that he just received approval for disability. Patient is waiting to hear what his benefits will be.  CSW answered all of the patient's questions and encouraged patient to file for Medicaid. Patent's Medicare benefits probably will not start immediately  and applying for medicaid would be beneficial.  Patient was agreeable to this. CSW contacted a Artist to contact patient about applying for Medicaid. The counselor will probably contact the patient on Monday.  On another note, the nurse reported that the patient's three children have been sleeping in the patient's room. CSW had to address this with the patient and informed the patient that his children would not be able to spend the night. Patient plans to have his wife pick up the children. CSW will f/u with patient on Monday   Assessment/plan status:  Psychosocial Support/Ongoing Assessment of Needs Other assessment/ plan:   Information/referral to community  resources:    PATIENT'S/FAMILY'S RESPONSE TO PLAN OF CARE: Patient was very receptive and appreciative of information and support provided by CSW.    Sabino Niemann, MSW, [facility] [phone] Weekend Coverage

## 2012-01-08 DIAGNOSIS — D7289 Other specified disorders of white blood cells: Secondary | ICD-10-CM

## 2012-01-08 DIAGNOSIS — N186 End stage renal disease: Secondary | ICD-10-CM

## 2012-01-08 DIAGNOSIS — E1029 Type 1 diabetes mellitus with other diabetic kidney complication: Secondary | ICD-10-CM

## 2012-01-08 DIAGNOSIS — E1069 Type 1 diabetes mellitus with other specified complication: Secondary | ICD-10-CM

## 2012-01-08 LAB — GLUCOSE, CAPILLARY
Glucose-Capillary: 148 mg/dL — ABNORMAL HIGH (ref 70–99)
Glucose-Capillary: 171 mg/dL — ABNORMAL HIGH (ref 70–99)
Glucose-Capillary: 265 mg/dL — ABNORMAL HIGH (ref 70–99)
Glucose-Capillary: 313 mg/dL — ABNORMAL HIGH (ref 70–99)

## 2012-01-08 LAB — BASIC METABOLIC PANEL
Chloride: 105 mEq/L (ref 96–112)
Creatinine, Ser: 12.13 mg/dL — ABNORMAL HIGH (ref 0.50–1.35)
GFR calc Af Amer: 5 mL/min — ABNORMAL LOW (ref >90)
Potassium: 4.1 mEq/L (ref 3.5–5.1)
Sodium: 143 mEq/L (ref 135–145)

## 2012-01-08 MED ORDER — CHLORHEXIDINE GLUCONATE 4 % EX LIQD
1.0000 "application " | Freq: Once | CUTANEOUS | Status: AC
  Administered 2012-01-09: 1 via TOPICAL
  Filled 2012-01-08: qty 15

## 2012-01-08 MED ORDER — CEFAZOLIN SODIUM 1-5 GM-% IV SOLN
1.0000 g | INTRAVENOUS | Status: DC
  Filled 2012-01-08: qty 50

## 2012-01-08 NOTE — Progress Notes (Signed)
Pt requested to have a shower. Paged Claiborne Billings NP and order written for shower privileges.

## 2012-01-08 NOTE — Progress Notes (Signed)
  Subjective: Awake and appropriate, slightly lethargic. We talked about exteriorizing the PD catheter tomorrow under local anesthesia. I told him that Dr. Johna Sheriff offered to do this if OR space can be arranged. Patient and family understand and agree  Objective: Vital signs in last 24 hours: Temp:  [97.9 F (36.6 C)-98.4 F (36.9 C)] 98.4 F (36.9 C) (06/02 0910) Pulse Rate:  [68-80] 79  (06/02 0910) Resp:  [18-20] 18  (06/02 0910) BP: (119-156)/(66-89) 131/66 mmHg (06/02 0910) SpO2:  [95 %-100 %] 95 % (06/02 0910) Weight:  [131 lb 4.8 oz (59.557 kg)] 131 lb 4.8 oz (59.557 kg) (06/01 2051) Last BM Date: 01/07/12  Intake/Output from previous day: 06/01 0701 - 06/02 0700 In: 1268 [P.O.:480; I.V.:788] Out: -  Intake/Output this shift: Total I/O In: 240 [P.O.:240] Out: -   physical exam:  Awake and appropriate. No distress Abdomen: soft, flat, thin. I can palpate the PD catheter under the skin. It exits the peritoneal cavity and muscle wall to right of midline and is looped across midline and tip lies in LLQ.  Non tender. No apparent infection.  Lab Results:   [facility] 01/07/12 0550 01/06/12 1433  WBC 10.0 18.1*  HGB 9.8* 11.1*  HCT 28.5* 31.1*  PLT 202 234   BMET  Basename 01/08/12 0632 01/07/12 0550  NA 143 139  K 4.1 4.1  CL 105 102  CO2 18* 18*  GLUCOSE 136* 271*  BUN 115* 114*  CREATININE 12.13* 12.69*  CALCIUM 7.6* 7.6*   PT/INR  Basename 01/06/12 1433  LABPROT 13.7  INR 1.03   ABG No results found for this basename: PHART:2,PCO2:2,PO2:2,HCO3:2 in the last 72 hours  Studies/Results: Dg Chest 2 View  01/06/2012  *RADIOLOGY REPORT*  Clinical Data: Weakness, renal disorder, needs dialysis  CHEST - 2 VIEW  Comparison: 11/30/2010  Findings: Very mild patchy bilateral lower lobe opacities, possibly infectious, equivocal. No pleural effusion or pneumothorax.  Cardiomediastinal silhouette is within normal limits.  Visualized osseous structures are within  normal limits.  IMPRESSION: Very mild patchy bilateral lower lobe opacities, possibly infectious, equivocal.  Original Report Authenticated By: Charline Bills, M.D.    Anti-infectives: Anti-infectives     Start     Dose/Rate Route Frequency Ordered Stop   01/07/12 0800   ceFAZolin (ANCEF) IVPB 1 g/50 mL premix     Comments: GIVE ON CALL TO IR 6/1 FOR DIALYSIS CATHETER PLACEMENT      1 g 100 mL/hr over 30 Minutes Intravenous On call 01/06/12 1706 01/07/12 1201   01/06/12 2200   ceFAZolin (ANCEF) IVPB 1 g/50 mL premix        1 g 100 mL/hr over 30 Minutes Intravenous  Once 01/06/12 2004            Assessment/Plan:  ESRD: will plan PD catheter exteriorization tomorrow by Dr. Johna Sheriff if OR/minor procedure room arrangements can be made. Procedure discussed with patient and family. He agrees with plan. Uremia DM type I Hypertension   LOS: 2 days    Aamina Skiff M. Derrell Lolling, M.D., [facility] General and Minimally invasive Surgery Breast and Colorectal Surgery Office:   [phone] Pager:   [phone]  01/08/2012

## 2012-01-08 NOTE — Progress Notes (Signed)
Pt had CBG 400's Claiborne Billings NP paged and new orders received for insulin coverage will continue to assess frequently.

## 2012-01-08 NOTE — Progress Notes (Signed)
[facility] ROUNDING NOTE   Subjective:   Interval History: none.  Objective:  Vital signs in last 24 hours:  Temp:  [97.9 F (36.6 C)-98.4 F (36.9 C)] 98.4 F (36.9 C) (06/02 0910) Pulse Rate:  [68-80] 79  (06/02 0910) Resp:  [18-20] 18  (06/02 0910) BP: (119-156)/(66-89) 131/66 mmHg (06/02 0910) SpO2:  [95 %-100 %] 95 % (06/02 0910) Weight:  [59.557 kg (131 lb 4.8 oz)] 59.557 kg (131 lb 4.8 oz) (06/01 2051)  Weight change: -3.493 kg (-7 lb 11.2 oz) Filed Weights   01/06/12 1409 01/06/12 2030 01/07/12 2051  Weight: 63.05 kg (139 lb) 63.05 kg (139 lb) 59.557 kg (131 lb 4.8 oz)    Intake/Output: I/O last 3 completed shifts: In: 1486.3 [P.O.:480; I.V.:1006.3] Out: -    Intake/Output this shift:  Total I/O In: 240 [P.O.:240] Out: -   CVS- RRR RS- CTA ABD- BS present soft non-distended EXT- no edema   Basic Metabolic Panel:  Lab 01/08/12 1610 01/07/12 0550 01/06/12 1433  NA 143 139 141  K 4.1 4.1 4.1  CL 105 102 102  CO2 18* 18* 17*  GLUCOSE 136* 271* 34*  BUN 115* 114* 116*  CREATININE 12.13* 12.69* 12.72*  CALCIUM 7.6* 7.6* 7.9*  MG -- -- --  PHOS -- -- --    Liver Function Tests:  Lab 01/07/12 0550  AST 7  ALT 6  ALKPHOS 118*  BILITOT 0.2*  PROT 6.7  ALBUMIN 3.3*   No results found for this basename: LIPASE:5,AMYLASE:5 in the last 168 hours No results found for this basename: AMMONIA:3 in the last 168 hours  CBC:  Lab 01/07/12 0550 01/06/12 1433  WBC 10.0 18.1*  NEUTROABS -- 15.9*  HGB 9.8* 11.1*  HCT 28.5* 31.1*  MCV 86.4 86.1  PLT 202 234    Cardiac Enzymes: No results found for this basename: CKTOTAL:5,CKMB:5,CKMBINDEX:5,TROPONINI:5 in the last 168 hours  BNP: No components found with this basename: POCBNP:5  CBG:  Lab 01/08/12 0742 01/08/12 0451 01/07/12 2125 01/07/12 1652 01/07/12 1158  GLUCAP 171* 148* 484* 373* 477*    Microbiology: Results for orders placed during the hospital encounter of 01/06/12    SURGICAL PCR SCREEN     Status: Normal   Collection Time   01/06/12  1:44 PM      Component Value Range Status Comment   MRSA, PCR NEGATIVE  NEGATIVE  Final    Staphylococcus aureus NEGATIVE  NEGATIVE  Final     Coagulation Studies:  Basename 01/06/12 1433  LABPROT 13.7  INR 1.03    Urinalysis:  Basename 01/06/12 1534  COLORURINE YELLOW  LABSPEC 1.014  PHURINE 5.0  GLUCOSEU 100*  HGBUR SMALL*  BILIRUBINUR NEGATIVE  KETONESUR NEGATIVE  PROTEINUR >300*  UROBILINOGEN 0.2  NITRITE NEGATIVE  LEUKOCYTESUR TRACE*      Imaging: Dg Chest 2 View  01/06/2012  *RADIOLOGY REPORT*  Clinical Data: Weakness, renal disorder, needs dialysis  CHEST - 2 VIEW  Comparison: 11/30/2010  Findings: Very mild patchy bilateral lower lobe opacities, possibly infectious, equivocal. No pleural effusion or pneumothorax.  Cardiomediastinal silhouette is within normal limits.  Visualized osseous structures are within normal limits.  IMPRESSION: Very mild patchy bilateral lower lobe opacities, possibly infectious, equivocal.  Original Report Authenticated By: Charline Bills, M.D.     Medications:      . sodium chloride Stopped (01/07/12 2300)      . amLODipine  10 mg Oral QHS  . atorvastatin  20 mg Oral q1800  .  calcium carbonate  1 tablet Oral TID  .  ceFAZolin (ANCEF) IV  1 g Intravenous On Call  .  ceFAZolin (ANCEF) IV  1 g Intravenous Once  . diltiazem  240 mg Oral Daily  . insulin aspart  0-5 Units Subcutaneous QHS  . insulin aspart  0-9 Units Subcutaneous TID WC  . insulin aspart  6 Units Subcutaneous Once  . insulin aspart protamine-insulin aspart  15 Units Subcutaneous Q supper  . insulin aspart protamine-insulin aspart  30 Units Subcutaneous Q breakfast  . nicotine  21 mg Transdermal Once  . sodium bicarbonate  1,300 mg Oral BID  . DISCONTD: dextrose   Intravenous Once  . DISCONTD: insulin aspart  0-9 Units Subcutaneous TID WC  . DISCONTD: insulin aspart protamine-insulin aspart   15-30 Units Subcutaneous BID WC   acetaminophen, acetaminophen, HYDROcodone-acetaminophen, morphine injection, ondansetron (ZOFRAN) IV, ondansetron  Assessment/ Plan:  Mark [facility] is a 41 y.o. male with past medical history of type 1 diabetes mellitus, hypertension and end-stage renal disease.  1.ESRD issues surrounding medicaid and disability. Is being prepped for peritoneal dialysis and patient has elected to choose PD as an option  2.Anemia will follow  3.Bone renal diet and binder  4.HTN controlled   Patient should have his PD Catheter exteriorized and start PD. I have discussed with Mark Miranda and will consult surgeon. Mark Miranda will contact one of his colleagues in AM      LOS: 2 [facility] W @TODAY @11 :18 AM

## 2012-01-08 NOTE — Progress Notes (Signed)
DAILY PROGRESS NOTE                              [facility]  SUBJECTIVE: Denies any complaint, no events overnight, family at bedside  OBJECTIVE: BP 131/66  Pulse 79  Temp(Src) 98.4 F (36.9 C) (Oral)  Resp 18  Ht 5\' 8"  (1.727 m)  Wt 59.557 kg (131 lb 4.8 oz)  BMI 19.96 kg/m2  SpO2 95%  Intake/Output Summary (Last 24 hours) at 01/08/12 1302 Last data filed at 01/08/12 0900  Gross per 24 hour  Intake   1508 ml  Output      0 ml  Net   1508 ml                      Weight change: -3.493 kg (-7 lb 11.2 oz) Physical Exam: General: Alert and awake oriented x3 not in any acute distress. HEENT: anicteric sclera, pupils equal reactive to light and accommodation CVS: S1-S2 heard, no murmur rubs or gallops Chest: clear to auscultation bilaterally, no wheezing rales or rhonchi Abdomen:  normal bowel sounds, soft, nontender, nondistended, no organomegaly Neuro: Cranial nerves II-XII intact, no focal neurological deficits Extremities: no cyanosis, no clubbing or edema noted bilaterally   Lab Results:  [facility] 01/08/12 0632 01/07/12 0550  NA 143 139  K 4.1 4.1  CL 105 102  CO2 18* 18*  GLUCOSE 136* 271*  BUN 115* 114*  CREATININE 12.13* 12.69*  CALCIUM 7.6* 7.6*  MG -- --  PHOS -- --    Basename 01/07/12 0550  AST 7  ALT 6  ALKPHOS 118*  BILITOT 0.2*  PROT 6.7  ALBUMIN 3.3*   No results found for this basename: LIPASE:2,AMYLASE:2 in the last 72 hours  Basename 01/07/12 0550 01/06/12 1433  WBC 10.0 18.1*  NEUTROABS -- 15.9*  HGB 9.8* 11.1*  HCT 28.5* 31.1*  MCV 86.4 86.1  PLT 202 234   No results found for this basename: CKTOTAL:3,CKMB:3,CKMBINDEX:3,TROPONINI:3 in the last 72 hours No components found with this basename: POCBNP:3 No results found for this basename: DDIMER:2 in the last 72 hours  Basename 01/06/12 2118  HGBA1C 5.8*   No results found for this basename: CHOL:2,HDL:2,LDLCALC:2,TRIG:2,CHOLHDL:2,LDLDIRECT:2 in  the last 72 hours No results found for this basename: TSH,T4TOTAL,FREET3,T3FREE,THYROIDAB in the last 72 hours No results found for this basename: VITAMINB12:2,FOLATE:2,FERRITIN:2,TIBC:2,IRON:2,RETICCTPCT:2 in the last 72 hours  Micro Results: Recent Results (from the past 240 hour(s))  SURGICAL PCR SCREEN     Status: Normal   Collection Time   01/06/12  1:44 PM      Component Value Range Status Comment   MRSA, PCR NEGATIVE  NEGATIVE  Final    Staphylococcus aureus NEGATIVE  NEGATIVE  Final     Studies/Results: Dg Chest 2 View  01/06/2012  *RADIOLOGY REPORT*  Clinical Data: Weakness, renal disorder, needs dialysis  CHEST - 2 VIEW  Comparison: 11/30/2010  Findings: Very mild patchy bilateral lower lobe opacities, possibly infectious, equivocal. No pleural effusion or pneumothorax.  Cardiomediastinal silhouette is within normal limits.  Visualized osseous structures are within normal limits.  IMPRESSION: Very mild patchy bilateral lower lobe opacities, possibly infectious, equivocal.  Original Report Authenticated By: Charline Bills, M.D.   [place] Misc Soft Tissue  12/22/2011  *RADIOLOGY REPORT*  Clinical Data: Cyst on the right elbow.  ULTRASOUND RIGHT UPPER EXTREMITY COMPLETE  Technique:  Ultrasound examination of the right elbow was performed including  evaluation of the muscles, tendons, joint, and adjacent soft tissues.  Comparison:  None.  Findings: There is a 3.7 x 2.3 x 3.0 cm complex fluid collection in the olecranon region of the elbow.  This likely reflects olecranon bursitis complicated by hemorrhage or infection.  IMPRESSION: 3.7 x 2.3 x 3.0 cm complex fluid collection in the olecranon bursa, most likely hemorrhagic olecranon bursitis.  Infection would also be possible.  Aspiration is recommended.  Original Report Authenticated By: P. Loralie Champagne, M.D.   Medications: Scheduled Meds:    . amLODipine  10 mg Oral QHS  . atorvastatin  20 mg Oral q1800  . calcium carbonate  1 tablet  Oral TID  .  ceFAZolin (ANCEF) IV  1 g Intravenous Once  . diltiazem  240 mg Oral Daily  . insulin aspart  0-5 Units Subcutaneous QHS  . insulin aspart  0-9 Units Subcutaneous TID WC  . insulin aspart  6 Units Subcutaneous Once  . insulin aspart protamine-insulin aspart  15 Units Subcutaneous Q supper  . insulin aspart protamine-insulin aspart  30 Units Subcutaneous Q breakfast  . nicotine  21 mg Transdermal Once  . sodium bicarbonate  1,300 mg Oral BID  . DISCONTD: insulin aspart  0-9 Units Subcutaneous TID WC   Continuous Infusions:    . sodium chloride Stopped (01/07/12 2300)   PRN Meds:.acetaminophen, acetaminophen, HYDROcodone-acetaminophen, morphine injection, ondansetron (ZOFRAN) IV, ondansetron  ASSESSMENT & PLAN: Principal Problem:  *End stage renal disease Active Problems:  HTN (hypertension)  DM (diabetes mellitus) type I uncontrolled with renal manifestation  Leukocytosis  Hypoglycemia  Uremia   ESRD -Patient was sent from his nephrologist office to initiate hemodialysis. -After discussion with his nephrologist, decision was made to start peritoneal dialysis using his PD catheter -General surgery to exteriorize his peritoneal dialysis catheter tomorrow if scheduled allows.  Leukocytosis -With marked neutrophilia, but this seems chronic and stable. -No signs of infections.  Diabetes mellitus type 1 -Has episode of severe hypoglycemia yesterday secondary to poor oral intake. -His insulin was held yesterday, we'll restarted today as he was restarted on diet.  Hypertension -Controlled, continue preadmission medications   LOS: 2 days   Jaylene Schrom A 01/08/2012, 1:02 PM

## 2012-01-09 ENCOUNTER — Encounter (HOSPITAL_COMMUNITY): Payer: Self-pay | Admitting: Anesthesiology

## 2012-01-09 ENCOUNTER — Encounter (HOSPITAL_COMMUNITY)
Admission: EM | Disposition: A | Discharge status: Home / Self Care | Payer: Self-pay | Source: Home / Self Care | Attending: Internal Medicine

## 2012-01-09 DIAGNOSIS — E1029 Type 1 diabetes mellitus with other diabetic kidney complication: Secondary | ICD-10-CM

## 2012-01-09 DIAGNOSIS — D7289 Other specified disorders of white blood cells: Secondary | ICD-10-CM

## 2012-01-09 DIAGNOSIS — E1069 Type 1 diabetes mellitus with other specified complication: Secondary | ICD-10-CM

## 2012-01-09 DIAGNOSIS — Z4902 Encounter for fitting and adjustment of peritoneal dialysis catheter: Secondary | ICD-10-CM

## 2012-01-09 DIAGNOSIS — N186 End stage renal disease: Secondary | ICD-10-CM

## 2012-01-09 HISTORY — PX: CAPD INSERTION: SHX5233

## 2012-01-09 LAB — GLUCOSE, CAPILLARY
Glucose-Capillary: 158 mg/dL — ABNORMAL HIGH (ref 70–99)
Glucose-Capillary: 165 mg/dL — ABNORMAL HIGH (ref 70–99)
Glucose-Capillary: 123 mg/dL — ABNORMAL HIGH (ref 70–99)

## 2012-01-09 LAB — CBC
HCT: 29.7 % — ABNORMAL LOW (ref 39.0–52.0)
Hemoglobin: 10.4 g/dL — ABNORMAL LOW (ref 13.0–17.0)
MCH: 29.9 pg (ref 26.0–34.0)
MCHC: 35 g/dL (ref 30.0–36.0)
MCV: 85.3 fL (ref 78.0–100.0)
Platelets: 210 10*3/uL (ref 150–400)
RBC: 3.48 MIL/uL — ABNORMAL LOW (ref 4.22–5.81)
RDW: 13.9 % (ref 11.5–15.5)
WBC: 10.5 10*3/uL (ref 4.0–10.5)

## 2012-01-09 LAB — BASIC METABOLIC PANEL
CO2: 18 mEq/L — ABNORMAL LOW (ref 19–32)
Calcium: 7.8 mg/dL — ABNORMAL LOW (ref 8.4–10.5)
Glucose, Bld: 211 mg/dL — ABNORMAL HIGH (ref 70–99)
Sodium: 139 mEq/L (ref 135–145)

## 2012-01-09 LAB — CREATININE, SERUM: GFR calc Af Amer: 5 mL/min — ABNORMAL LOW (ref >90)

## 2012-01-09 SURGERY — INSERTION, CATHETER, CAPD
Anesthesia: LOCAL | Wound class: Clean

## 2012-01-09 MED ORDER — HEPARIN SODIUM (PORCINE) 1000 UNIT/ML IJ SOLN
3000.0000 [IU] | Freq: Once | INTRAMUSCULAR | Status: AC
  Administered 2012-01-10: 3000 [IU] via INTRAPERITONEAL
  Filled 2012-01-09: qty 3

## 2012-01-09 MED ORDER — HEPARIN SODIUM (PORCINE) 1000 UNIT/ML IJ SOLN: 500.0000 [IU] | Freq: Once | INTRAMUSCULAR | Status: DC

## 2012-01-09 MED ORDER — DELFLEX-LC/2.5% DEXTROSE 394 MOSM/L IP SOLN: Freq: Once | INTRAPERITONEAL | Status: DC

## 2012-01-09 MED ORDER — DARBEPOETIN ALFA-POLYSORBATE 40 MCG/0.4ML IJ SOLN
40.0000 ug | INTRAMUSCULAR | Status: DC
  Administered 2012-01-09: 40 ug via SUBCUTANEOUS
  Filled 2012-01-09 (×3): qty 0.4

## 2012-01-09 MED ORDER — ONDANSETRON HCL 4 MG PO TABS: 4.0000 mg | ORAL_TABLET | Freq: Four times a day (QID) | ORAL | Status: DC | PRN

## 2012-01-09 MED ORDER — RENA-VITE PO TABS
1.0000 | ORAL_TABLET | Freq: Every day | ORAL | Status: DC
  Administered 2012-01-09 – 2012-01-14 (×6): 1 via ORAL
  Filled 2012-01-09 (×6): qty 1

## 2012-01-09 MED ORDER — LIDOCAINE-EPINEPHRINE 1 %-1:100000 IJ SOLN
INTRAMUSCULAR | Status: DC | PRN
  Administered 2012-01-09: 30 mL

## 2012-01-09 MED ORDER — DELFLEX-LC/1.5% DEXTROSE 346 MOSM/L IP SOLN
Freq: Once | INTRAPERITONEAL | Status: AC
  Administered 2012-01-09: 21:00:00 via INTRAPERITONEAL

## 2012-01-09 MED ORDER — HEPARIN SODIUM (PORCINE) 1000 UNIT/ML IJ SOLN
1000.0000 [IU] | Freq: Once | INTRAMUSCULAR | Status: DC
  Filled 2012-01-09: qty 1

## 2012-01-09 MED ORDER — ONDANSETRON HCL 4 MG/2ML IJ SOLN: 4.0000 mg | Freq: Four times a day (QID) | INTRAMUSCULAR | Status: DC | PRN

## 2012-01-09 MED ORDER — DEXTROSE 50 % IV SOLN
INTRAVENOUS | Status: AC
  Administered 2012-01-09: 50 mL
  Filled 2012-01-09: qty 50

## 2012-01-09 MED ORDER — HEPARIN SODIUM (PORCINE) 5000 UNIT/ML IJ SOLN
5000.0000 [IU] | Freq: Three times a day (TID) | INTRAMUSCULAR | Status: DC
  Administered 2012-01-09 – 2012-01-10 (×3): 5000 [IU] via SUBCUTANEOUS
  Filled 2012-01-09 (×6): qty 1

## 2012-01-09 MED ORDER — SODIUM CHLORIDE 0.9 % IV SOLN
INTRAVENOUS | Status: DC
  Administered 2012-01-09: 10 mL/h via INTRAVENOUS
  Administered 2012-01-10: 20 mL/h via INTRAVENOUS
  Administered 2012-01-13: 10:00:00 via INTRAVENOUS

## 2012-01-09 MED ORDER — HEPARIN SOD (PORK) LOCK FLUSH 100 UNIT/ML IV SOLN
INTRAVENOUS | Status: DC | PRN
  Administered 2012-01-09: 100 [IU]

## 2012-01-09 MED ORDER — SODIUM BICARBONATE 4 % IV SOLN
INTRAVENOUS | Status: DC | PRN
  Administered 2012-01-09: 10 mL via INTRAVENOUS

## 2012-01-09 SURGICAL SUPPLY — 12 items
BLADE SURG 15 STRL LF DISP TIS (BLADE) ×1 IMPLANT
BLADE SURG 15 STRL SS (BLADE) ×1
CONT SPEC 4OZ CLIKSEAL STRL BL (MISCELLANEOUS) ×2 IMPLANT
DRAPE LAPAROSCOPIC ABDOMINAL (DRAPES) ×2 IMPLANT
GAUZE SPONGE 4X4 16PLY XRAY LF (GAUZE/BANDAGES/DRESSINGS) ×2 IMPLANT
GLOVE BIO SURGEON STRL SZ 6.5 (GLOVE) ×2 IMPLANT
GLOVE BIO SURGEON STRL SZ7.5 (GLOVE) ×2 IMPLANT
GOWN STRL NON-REIN LRG LVL3 (GOWN DISPOSABLE) ×4 IMPLANT
GOWN STRL REIN XL XLG (GOWN DISPOSABLE) ×2 IMPLANT
NEEDLE HYPO 25GX1X1/2 BEV (NEEDLE) ×2 IMPLANT
SYR CONTROL 10ML LL (SYRINGE) ×2 IMPLANT
TOWEL OR 17X24 6PK STRL BLUE (TOWEL DISPOSABLE) ×2 IMPLANT

## 2012-01-09 NOTE — Progress Notes (Signed)
Patient ID: Mark [facility], male   DOB: 1970/12/31, 41 y.o.   MRN: [id number]    Subjective: Pt feels ok.  No c/o  Objective: Vital signs in last 24 hours: Temp:  [97.5 F (36.4 C)-99.1 F (37.3 C)] 97.5 F (36.4 C) (06/03 0459) Pulse Rate:  [74-80] 74  (06/03 0459) Resp:  [18] 18  (06/03 0459) BP: (131-157)/(66-77) 135/72 mmHg (06/03 0459) SpO2:  [95 %-100 %] 100 % (06/03 0459) Weight:  [137 lb 6.4 oz (62.324 kg)] 137 lb 6.4 oz (62.324 kg) (06/02 2020) Last BM Date: 01/07/12  Intake/Output from previous day: 06/02 0701 - 06/03 0700 In: 720 [P.O.:720] Out: -  Intake/Output this shift:    PE: Abd: soft, NT, ND, +BS, PD catheter is palpable in the mid abdomen.  Lab Results:   Basename 01/07/12 0550 01/06/12 1433  WBC 10.0 18.1*  HGB 9.8* 11.1*  HCT 28.5* 31.1*  PLT 202 234   BMET  Basename 01/08/12 0632 01/07/12 0550  NA 143 139  K 4.1 4.1  CL 105 102  CO2 18* 18*  GLUCOSE 136* 271*  BUN 115* 114*  CREATININE 12.13* 12.69*  CALCIUM 7.6* 7.6*   PT/INR  Basename 01/06/12 1433  LABPROT 13.7  INR 1.03   CMP     Component Value Date/Time   NA 143 01/08/2012 0632   K 4.1 01/08/2012 0632   CL 105 01/08/2012 0632   CO2 18* 01/08/2012 0632   GLUCOSE 136* 01/08/2012 0632   BUN 115* 01/08/2012 0632   CREATININE 12.13* 01/08/2012 0632   CALCIUM 7.6* 01/08/2012 0632   CALCIUM 8.2* 09/17/2010 2201   PROT 6.7 01/07/2012 0550   ALBUMIN 3.3* 01/07/2012 0550   AST 7 01/07/2012 0550   ALT 6 01/07/2012 0550   ALKPHOS 118* 01/07/2012 0550   BILITOT 0.2* 01/07/2012 0550   GFRNONAA 5* 01/08/2012 0632   GFRAA 5* 01/08/2012 4098   Lipase  No results found for this basename: lipase       Studies/Results: No results found.  Anti-infectives: Anti-infectives     Start     Dose/Rate Route Frequency Ordered Stop   01/08/12 1936   ceFAZolin (ANCEF) IVPB 1 g/50 mL premix        1 g 100 mL/hr over 30 Minutes Intravenous 60 min pre-op 01/08/12 1936     01/07/12 0800   ceFAZolin (ANCEF) IVPB 1  g/50 mL premix     Comments: GIVE ON CALL TO IR 6/1 FOR DIALYSIS CATHETER PLACEMENT      1 g 100 mL/hr over 30 Minutes Intravenous On call 01/06/12 1706 01/07/12 1201   01/06/12 2200   ceFAZolin (ANCEF) IVPB 1 g/50 mL premix  Status:  Discontinued        1 g 100 mL/hr over 30 Minutes Intravenous  Once 01/06/12 2004 01/08/12 1939           Assessment/Plan  1. ESRD  Plan: 1. Will try to coordinate with short stay and Dr. Johna Sheriff today to get this PD cath exteriorized.  Keep the patient NPO for now.   LOS: 3 days    Merrilyn Legler E 01/09/2012

## 2012-01-09 NOTE — Progress Notes (Signed)
Dr. Johna Sheriff unavailable. I will exteriorize PD catheter today.   [facility]. Derrell Lolling, M.D., [facility] General and Minimally invasive Surgery Breast and Colorectal Surgery Office:   [phone] Pager:   [phone]

## 2012-01-09 NOTE — Progress Notes (Signed)
Report given to Ty Hicks,RN on 6700.  VSS.  Dr. Derrell Lolling stated that pt may be dialysed today but flushing of cath is according to nephrologist's orders.  Nurse verbalized understanding.//L. Temitope Flammer,RN

## 2012-01-09 NOTE — Progress Notes (Signed)
DAILY PROGRESS NOTE                              [facility]  SUBJECTIVE: Denies any complaint, no events overnight, family at bedside  OBJECTIVE: BP 160/83  Pulse 80  Temp(Src) 98.2 F (36.8 C) (Oral)  Resp 18  Ht 5\' 8"  (1.727 m)  Wt 62.324 kg (137 lb 6.4 oz)  BMI 20.89 kg/m2  SpO2 98%  Intake/Output Summary (Last 24 hours) at 01/09/12 1117 Last data filed at 01/09/12 0300  Gross per 24 hour  Intake    480 ml  Output      0 ml  Net    480 ml                      Weight change: 2.767 kg (6 lb 1.6 oz) Physical Exam: General: Alert and awake oriented x3 not in any acute distress. HEENT: anicteric sclera, pupils equal reactive to light and accommodation CVS: S1-S2 heard, no murmur rubs or gallops Chest: clear to auscultation bilaterally, no wheezing rales or rhonchi Abdomen:  normal bowel sounds, soft, nontender, nondistended, no organomegaly Neuro: Cranial nerves II-XII intact, no focal neurological deficits Extremities: no cyanosis, no clubbing or edema noted bilaterally   Lab Results:  Basename 01/09/12 0844 01/08/12 0632  NA 139 143  K 4.9 4.1  CL 101 105  CO2 18* 18*  GLUCOSE 211* 136*  BUN 121* 115*  CREATININE 11.94* 12.13*  CALCIUM 7.8* 7.6*  MG -- --  PHOS -- --    Basename 01/07/12 0550  AST 7  ALT 6  ALKPHOS 118*  BILITOT 0.2*  PROT 6.7  ALBUMIN 3.3*   No results found for this basename: LIPASE:2,AMYLASE:2 in the last 72 hours  Basename 01/07/12 0550 01/06/12 1433  WBC 10.0 18.1*  NEUTROABS -- 15.9*  HGB 9.8* 11.1*  HCT 28.5* 31.1*  MCV 86.4 86.1  PLT 202 234   No results found for this basename: CKTOTAL:3,CKMB:3,CKMBINDEX:3,TROPONINI:3 in the last 72 hours No components found with this basename: POCBNP:3 No results found for this basename: DDIMER:2 in the last 72 hours  Basename 01/06/12 2118  HGBA1C 5.8*   No results found for this basename: CHOL:2,HDL:2,LDLCALC:2,TRIG:2,CHOLHDL:2,LDLDIRECT:2 in the  last 72 hours No results found for this basename: TSH,T4TOTAL,FREET3,T3FREE,THYROIDAB in the last 72 hours No results found for this basename: VITAMINB12:2,FOLATE:2,FERRITIN:2,TIBC:2,IRON:2,RETICCTPCT:2 in the last 72 hours  Micro Results: Recent Results (from the past 240 hour(s))  SURGICAL PCR SCREEN     Status: Normal   Collection Time   01/06/12  1:44 PM      Component Value Range Status Comment   MRSA, PCR NEGATIVE  NEGATIVE  Final    Staphylococcus aureus NEGATIVE  NEGATIVE  Final     Studies/Results: Dg Chest 2 View  01/06/2012  *RADIOLOGY REPORT*  Clinical Data: Weakness, renal disorder, needs dialysis  CHEST - 2 VIEW  Comparison: 11/30/2010  Findings: Very mild patchy bilateral lower lobe opacities, possibly infectious, equivocal. No pleural effusion or pneumothorax.  Cardiomediastinal silhouette is within normal limits.  Visualized osseous structures are within normal limits.  IMPRESSION: Very mild patchy bilateral lower lobe opacities, possibly infectious, equivocal.  Original Report Authenticated By: Charline Bills, M.D.   [place] Misc Soft Tissue  12/22/2011  *RADIOLOGY REPORT*  Clinical Data: Cyst on the right elbow.  ULTRASOUND RIGHT UPPER EXTREMITY COMPLETE  Technique:  Ultrasound examination of the right elbow was  performed including evaluation of the muscles, tendons, joint, and adjacent soft tissues.  Comparison:  None.  Findings: There is a 3.7 x 2.3 x 3.0 cm complex fluid collection in the olecranon region of the elbow.  This likely reflects olecranon bursitis complicated by hemorrhage or infection.  IMPRESSION: 3.7 x 2.3 x 3.0 cm complex fluid collection in the olecranon bursa, most likely hemorrhagic olecranon bursitis.  Infection would also be possible.  Aspiration is recommended.  Original Report Authenticated By: P. Loralie Champagne, M.D.   Medications: Scheduled Meds:    . amLODipine  10 mg Oral QHS  . atorvastatin  20 mg Oral q1800  . calcium carbonate  1 tablet Oral  TID  . chlorhexidine  1 application Topical Once  . darbepoetin (ARANESP) injection - DIALYSIS  40 mcg Subcutaneous Q Mon-HD  . heparin  5,000 Units Subcutaneous Q8H  . insulin aspart  0-5 Units Subcutaneous QHS  . insulin aspart  0-9 Units Subcutaneous TID WC  . insulin aspart protamine-insulin aspart  15 Units Subcutaneous Q supper  . insulin aspart protamine-insulin aspart  30 Units Subcutaneous Q breakfast  . multivitamin  1 tablet Oral Daily  . sodium bicarbonate  1,300 mg Oral BID  . DISCONTD:  ceFAZolin (ANCEF) IV  1 g Intravenous Once  . DISCONTD:  ceFAZolin (ANCEF) IV  1 g Intravenous 60 min Pre-Op  . DISCONTD: diltiazem  240 mg Oral Daily   Continuous Infusions:    . sodium chloride 10 mL/hr (01/09/12 1103)  . DISCONTD: sodium chloride Stopped (01/07/12 2300)   PRN Meds:.acetaminophen, acetaminophen, heparin lock flush, HYDROcodone-acetaminophen, lidocaine-EPINEPHrine, morphine injection, ondansetron (ZOFRAN) IV, ondansetron (ZOFRAN) IV, ondansetron, ondansetron, sodium bicarbonate  ASSESSMENT & PLAN: Principal Problem:  *End stage renal disease Active Problems:  HTN (hypertension)  DM (diabetes mellitus) type I uncontrolled with renal manifestation  Leukocytosis  Hypoglycemia  Uremia   ESRD -Patient was sent from his nephrologist office to initiate hemodialysis. -After discussion with his nephrologist, decision was made to start peritoneal dialysis using his PD catheter -General surgery to exteriorize his peritoneal dialysis catheter today if schedule allows.  Leukocytosis -With marked neutrophilia, but this seems chronic and stable. -No signs of infections.  Diabetes mellitus type 1 -Restarted back on his insulin.  Hypertension -Controlled, continue preadmission medications  Disposition -Stays as inpatient, case manager to followup on as Medicaid primary number.   LOS: 3 days   Dara Beidleman A 01/09/2012, 11:17 AM

## 2012-01-09 NOTE — Progress Notes (Signed)
Patient ID: Mark Miranda, male   DOB: 01/15/1971, 41 y.o.   MRN: [id number] Patient seen due to problems with PD catheter.  End of catheter was dislodged and when reapplied catheter would not flush.  Removed end of catheter, flushed and withdrew without resistence, then reattached.  Catheter then was flowing well.  Call with questions.  Glenna Brunkow 12:36 PM 01/09/2012

## 2012-01-09 NOTE — Progress Notes (Signed)
S: Feels well.  Wants to eat O:BP 135/72  Pulse 74  Temp(Src) 97.5 F (36.4 C) (Oral)  Resp 18  Ht 5\' 8"  (1.727 m)  Wt 62.324 kg (137 lb 6.4 oz)  BMI 20.89 kg/m2  SpO2 100%  Intake/Output Summary (Last 24 hours) at 01/09/12 1041 Last data filed at 01/09/12 0300  Gross per 24 hour  Intake    480 ml  Output      0 ml  Net    480 ml   Weight change: 2.767 kg (6 lb 1.6 oz) WGN:FAOZH and alert CVS:RRR with 2-3/6 systolic murmur Resp:clear Abd:+BS NTND  PD cath exteriorized Ext:no edema NEURO: CNI  Ox3      . amLODipine  10 mg Oral QHS  . atorvastatin  20 mg Oral q1800  . calcium carbonate  1 tablet Oral TID  . chlorhexidine  1 application Topical Once  . diltiazem  240 mg Oral Daily  . heparin  5,000 Units Subcutaneous Q8H  . insulin aspart  0-5 Units Subcutaneous QHS  . insulin aspart  0-9 Units Subcutaneous TID WC  . insulin aspart protamine-insulin aspart  15 Units Subcutaneous Q supper  . insulin aspart protamine-insulin aspart  30 Units Subcutaneous Q breakfast  . sodium bicarbonate  1,300 mg Oral BID  . DISCONTD:  ceFAZolin (ANCEF) IV  1 g Intravenous Once  . DISCONTD:  ceFAZolin (ANCEF) IV  1 g Intravenous 60 min Pre-Op   No results found. BMET    Component Value Date/Time   NA 139 01/09/2012 0844   K 4.9 01/09/2012 0844   CL 101 01/09/2012 0844   CO2 18* 01/09/2012 0844   GLUCOSE 211* 01/09/2012 0844   BUN 121* 01/09/2012 0844   CREATININE 11.94* 01/09/2012 0844   CALCIUM 7.8* 01/09/2012 0844   CALCIUM 8.2* 09/17/2010 2201   GFRNONAA 5* 01/09/2012 0844   GFRAA 5* 01/09/2012 0844   CBC    Component Value Date/Time   WBC 10.0 01/07/2012 0550   RBC 3.30* 01/07/2012 0550   HGB 9.8* 01/07/2012 0550   HCT 28.5* 01/07/2012 0550   PLT 202 01/07/2012 0550   MCV 86.4 01/07/2012 0550   MCH 29.7 01/07/2012 0550   MCHC 34.4 01/07/2012 0550   RDW 14.1 01/07/2012 0550   LYMPHSABS 1.2 01/06/2012 1433   MONOABS 0.8 01/06/2012 1433   EOSABS 0.2 01/06/2012 1433   BASOSABS 0.0 01/06/2012 1433      Assessment: 1. ESRD 2. HTN 3. DM 4. Anemia Plan: 1. Start CAPD 2. Working on [facility].  If only gets emergency medicaid then I am told that he cannot do PD 3.  Renal diet 4. Start aranesp 5. Check PO4 and pth 6. Dc dilt  Maryanne Huneycutt T

## 2012-01-09 NOTE — Progress Notes (Signed)
Spoke with finance re pt financial status. Per record and pt he has Disability, finance [facility]) will followup re Medicaid and start application if needed.  Johny Shock RN MPH Case Manager [phone]

## 2012-01-09 NOTE — Progress Notes (Addendum)
Staff during procedure: Nancy Marus RN, Evans Lance- Short Stay OR, Dr Dennie Maizes [place]- PA student, Vertis Kelch, and Dema Severin stay RN. Time [id number].  1-10 sponge pack used during procedure  And 1-Ethilon 3-0 suture,#663 w/ count  being done by Shirley Friar. 2-4x4 used w/ Hyperflex tape, this dressing applied by resident, Marisue Ivan White.//L. Fayth Trefry,RN

## 2012-01-09 NOTE — Progress Notes (Signed)
Agree with PA assessment and interventions, as described.  Angelia Mould. Derrell Lolling, M.D., [facility] General and Minimally invasive Surgery Breast and Colorectal Surgery Office:   [phone] Pager:   [phone]

## 2012-01-09 NOTE — Op Note (Signed)
Patient Name:           Mark Miranda   Date of Surgery:        01/09/2012  Pre op Diagnosis:      End stage renal disease  Post op Diagnosis:    same  Procedure:                 Exteriorization  of peritoneal dialysis catheter  Surgeon:                     Angelia Mould. Derrell Lolling, M.D., FACS  Assistant:                      Clance Boll, P.A.  Operative Indications:    41yo male with ESRD, HTN, DM admitted because of declining renal function. Pt had [place]crief[place]Popovich PD catheter placed by Dr Zachery Dakins on 11/30/10. Pt was not dialysis dependent at that time so Dr Zachery Dakins buried the catheter in the subcutaneous tissue in the abdominal wall with plans to exteriorize it later. Pt denies fevers, chills, nausea, vomiting. +weakness, fatigue. He was admitted emergently this weekend because of uremia. We have been requested to exteriorize the PD catheter. The patient and his family have been counseled regarding this. The procedure is being performed in the minor procedure room in the short stay department.   Operative Findings:       The PD catheter exited the peritoneal cavity through the right rectus sheath and was coiled inferiorly. The catheter was brought out about 6 cm cephalad to the area where it penetrated the rectus sheath and the catheter flushed well.  Procedure in Detail:          The patient was placed supine on the operating table. The abdomen was prepped and draped in a sterile fashion. A timeout was performed. 1% Xylocaine with epinephrine was used as a local infiltration anesthetic. I made a 1.5 cm transverse incision overlying the catheter in the right lower quadrant. The catheter was isolated and the sheath was dissected away. I was able to pull the catheter completely back and up into the open wound. The end of the catheter had been tied off with a silk suture. I then made a transverse incision about 6 or 7 cm cephalad so that it would be in the right side of the abdomen several centimeters  above the place where it exited the rectus sheath. This was tunneled and the catheter brought into this area. I then cut off 1 cm of tip of the the catheter where the silk suture was and then come back and flush the catheter fairly easily. I then flushed the catheter with heparinized saline. I secured the connector tubing kit to this and capped it off. The right lower quadrant wound was closed with sutures. Bandages were placed. The patient was returned to his room in stable condition. There were no complications. EBL 10 cc or less. Counts correct.     Angelia Mould. Derrell Lolling, M.D., FACS General and Minimally Invasive Surgery Breast and Colorectal Surgery  01/09/2012 10:02 AM

## 2012-01-10 ENCOUNTER — Encounter (HOSPITAL_COMMUNITY): Payer: Self-pay | Admitting: Anesthesiology

## 2012-01-10 ENCOUNTER — Inpatient Hospital Stay (HOSPITAL_COMMUNITY): Payer: Medicaid Other | Admitting: Anesthesiology

## 2012-01-10 ENCOUNTER — Ambulatory Visit (HOSPITAL_COMMUNITY): Admission: RE | Admit: 2012-01-10 | Payer: Self-pay | Source: Ambulatory Visit | Admitting: Vascular Surgery

## 2012-01-10 ENCOUNTER — Encounter (HOSPITAL_COMMUNITY): Payer: Self-pay

## 2012-01-10 ENCOUNTER — Telehealth: Payer: Self-pay | Admitting: Vascular Surgery

## 2012-01-10 ENCOUNTER — Inpatient Hospital Stay (HOSPITAL_COMMUNITY): Payer: Medicaid Other

## 2012-01-10 ENCOUNTER — Encounter (HOSPITAL_COMMUNITY)
Admission: EM | Disposition: A | Discharge status: Home / Self Care | Payer: Self-pay | Source: Home / Self Care | Attending: Internal Medicine

## 2012-01-10 DIAGNOSIS — E1069 Type 1 diabetes mellitus with other specified complication: Secondary | ICD-10-CM

## 2012-01-10 DIAGNOSIS — D7289 Other specified disorders of white blood cells: Secondary | ICD-10-CM

## 2012-01-10 DIAGNOSIS — N186 End stage renal disease: Secondary | ICD-10-CM

## 2012-01-10 DIAGNOSIS — E1029 Type 1 diabetes mellitus with other diabetic kidney complication: Secondary | ICD-10-CM

## 2012-01-10 HISTORY — PX: AV FISTULA PLACEMENT: SHX1204

## 2012-01-10 LAB — GLUCOSE, CAPILLARY
Glucose-Capillary: 340 mg/dL — ABNORMAL HIGH (ref 70–99)
Glucose-Capillary: 479 mg/dL — ABNORMAL HIGH (ref 70–99)

## 2012-01-10 LAB — IRON AND TIBC
Iron: 138 ug/dL — ABNORMAL HIGH (ref 42–135)
UIBC: 88 ug/dL — ABNORMAL LOW (ref 125–400)

## 2012-01-10 LAB — BASIC METABOLIC PANEL
BUN: 119 mg/dL — ABNORMAL HIGH (ref 6–23)
CO2: 21 mEq/L (ref 19–32)
Calcium: 7.8 mg/dL — ABNORMAL LOW (ref 8.4–10.5)
Chloride: 98 mEq/L (ref 96–112)
Creatinine, Ser: 11.38 mg/dL — ABNORMAL HIGH (ref 0.50–1.35)
GFR calc Af Amer: 6 mL/min — ABNORMAL LOW (ref >90)
GFR calc non Af Amer: 5 mL/min — ABNORMAL LOW (ref >90)
Glucose, Bld: 296 mg/dL — ABNORMAL HIGH (ref 70–99)
Potassium: 4.5 mEq/L (ref 3.5–5.1)
Sodium: 133 mEq/L — ABNORMAL LOW (ref 135–145)

## 2012-01-10 LAB — RENAL FUNCTION PANEL
Albumin: 3.3 g/dL — ABNORMAL LOW (ref 3.5–5.2)
BUN: 117 mg/dL — ABNORMAL HIGH (ref 6–23)
CO2: 20 mEq/L (ref 19–32)
Chloride: 100 mEq/L (ref 96–112)
Glucose, Bld: 200 mg/dL — ABNORMAL HIGH (ref 70–99)
Potassium: 4.4 mEq/L (ref 3.5–5.1)

## 2012-01-10 SURGERY — ARTERIOVENOUS (AV) FISTULA CREATION
Anesthesia: General | Site: Arm Upper | Laterality: Left | Wound class: Clean

## 2012-01-10 MED ORDER — INSULIN ASPART 100 UNIT/ML ~~LOC~~ SOLN
7.0000 [IU] | Freq: Once | SUBCUTANEOUS | Status: AC
  Administered 2012-01-10: 7 [IU] via INTRAVENOUS

## 2012-01-10 MED ORDER — INSULIN ASPART 100 UNIT/ML ~~LOC~~ SOLN
7.0000 [IU] | Freq: Once | SUBCUTANEOUS | Status: AC
  Administered 2012-01-10: 7 [IU] via SUBCUTANEOUS

## 2012-01-10 MED ORDER — OXYCODONE-ACETAMINOPHEN 5-325 MG PO TABS
1.0000 | ORAL_TABLET | Freq: Four times a day (QID) | ORAL | Status: DC | PRN
Start: 2012-01-10 — End: 2012-01-14

## 2012-01-10 MED ORDER — OXYCODONE HCL 5 MG PO TABS: 5.0000 mg | ORAL_TABLET | ORAL | Status: DC | PRN

## 2012-01-10 MED ORDER — ONDANSETRON HCL 4 MG/2ML IJ SOLN
4.0000 mg | Freq: Once | INTRAMUSCULAR | Status: DC | PRN
Start: 2012-01-10 — End: 2012-01-10

## 2012-01-10 MED ORDER — OXYCODONE HCL 5 MG PO TABS: 5.0000 mg | ORAL_TABLET | Freq: Four times a day (QID) | ORAL | Status: AC | PRN

## 2012-01-10 MED ORDER — HYDROMORPHONE HCL PF 1 MG/ML IJ SOLN
0.2500 mg | INTRAMUSCULAR | Status: DC | PRN
  Administered 2012-01-10: 0.5 mg via INTRAVENOUS

## 2012-01-10 MED ORDER — PROPOFOL 10 MG/ML IV EMUL
INTRAVENOUS | Status: DC | PRN
  Administered 2012-01-10: 200 mg via INTRAVENOUS
  Administered 2012-01-10: 50 mg via INTRAVENOUS

## 2012-01-10 MED ORDER — LIDOCAINE HCL (CARDIAC) 20 MG/ML IV SOLN
INTRAVENOUS | Status: DC | PRN
  Administered 2012-01-10: 60 mg via INTRAVENOUS

## 2012-01-10 MED ORDER — HYDROMORPHONE HCL PF 1 MG/ML IJ SOLN
1.0000 mg | INTRAMUSCULAR | Status: DC | PRN
  Administered 2012-01-10: 1 mg via INTRAVENOUS
  Filled 2012-01-10: qty 1

## 2012-01-10 MED ORDER — 0.9 % SODIUM CHLORIDE (POUR BTL) OPTIME
TOPICAL | Status: DC | PRN
  Administered 2012-01-10: 1000 mL

## 2012-01-10 MED ORDER — OXYCODONE HCL 5 MG PO TABS
5.0000 mg | ORAL_TABLET | Freq: Four times a day (QID) | ORAL | Status: DC | PRN
Start: 2012-01-10 — End: 2012-01-10

## 2012-01-10 MED ORDER — SODIUM CHLORIDE 0.9 % IR SOLN
Status: DC | PRN
  Administered 2012-01-10: 11:00:00

## 2012-01-10 MED ORDER — CEFAZOLIN SODIUM 1-5 GM-% IV SOLN
INTRAVENOUS | Status: DC | PRN
  Administered 2012-01-10: 1 g via INTRAVENOUS

## 2012-01-10 MED ORDER — SODIUM CHLORIDE 0.9 % IV SOLN
INTRAVENOUS | Status: DC | PRN
  Administered 2012-01-10: 10:00:00 via INTRAVENOUS

## 2012-01-10 MED ORDER — OXYCODONE-ACETAMINOPHEN 5-325 MG PO TABS
1.0000 | ORAL_TABLET | Freq: Four times a day (QID) | ORAL | Status: DC | PRN
  Administered 2012-01-11 – 2012-01-14 (×8): 1 via ORAL
  Filled 2012-01-10 (×8): qty 1

## 2012-01-10 MED ORDER — PROTAMINE SULFATE 10 MG/ML IV SOLN
INTRAVENOUS | Status: DC | PRN
  Administered 2012-01-10: 50 mg via INTRAVENOUS

## 2012-01-10 MED ORDER — HEPARIN SODIUM (PORCINE) 1000 UNIT/ML IJ SOLN
INTRAMUSCULAR | Status: DC | PRN
  Administered 2012-01-10: 5000 [IU] via INTRAVENOUS

## 2012-01-10 MED ORDER — DELFLEX-LM/1.5% DEXTROSE 346 MOSM/L IP SOLN: Freq: Once | INTRAPERITONEAL | Status: DC

## 2012-01-10 MED ORDER — OXYCODONE HCL 5 MG PO TABS: 5.0000 mg | ORAL_TABLET | Freq: Four times a day (QID) | ORAL | Status: DC | PRN

## 2012-01-10 MED ORDER — OXYCODONE-ACETAMINOPHEN 5-325 MG PO TABS: 1.0000 | ORAL_TABLET | Freq: Four times a day (QID) | ORAL | Status: DC | PRN

## 2012-01-10 MED ORDER — FENTANYL CITRATE 0.05 MG/ML IJ SOLN
INTRAMUSCULAR | Status: DC | PRN
Start: 2012-01-10 — End: 2012-01-10
  Administered 2012-01-10 (×3): 50 ug via INTRAVENOUS
  Administered 2012-01-10: 100 ug via INTRAVENOUS

## 2012-01-10 MED ORDER — SORBITOL 70 % SOLN
30.0000 mL | Status: DC | PRN
  Administered 2012-01-10 – 2012-01-11 (×3): 30 mL via ORAL
  Filled 2012-01-10 (×3): qty 30

## 2012-01-10 MED ORDER — INSULIN ASPART 100 UNIT/ML ~~LOC~~ SOLN
SUBCUTANEOUS | Status: DC | PRN
  Administered 2012-01-10: 4 [IU] via INTRAVENOUS

## 2012-01-10 MED ORDER — HEPARIN SODIUM (PORCINE) 5000 UNIT/ML IJ SOLN
5000.0000 [IU] | Freq: Three times a day (TID) | INTRAMUSCULAR | Status: DC
  Administered 2012-01-10 – 2012-01-13 (×9): 5000 [IU] via SUBCUTANEOUS
  Filled 2012-01-10 (×15): qty 1

## 2012-01-10 MED FILL — Lidocaine HCl Local Preservative Free (PF) Inj 1%: INTRAMUSCULAR | Qty: 30 | Status: AC

## 2012-01-10 MED FILL — Lidocaine HCl Local Preservative Free (PF) Inj 1%: INTRAMUSCULAR | Qty: 5 | Status: AC

## 2012-01-10 MED FILL — Sodium Bicarbonate IV Soln 4.2%: INTRAVENOUS | Qty: 5 | Status: AC

## 2012-01-10 MED FILL — Lidocaine Inj 1% w/ Epinephrine-1:200000 (PF): INTRAMUSCULAR | Qty: 30 | Status: AC

## 2012-01-10 SURGICAL SUPPLY — 44 items
CANISTER SUCTION 2500CC (MISCELLANEOUS) ×2 IMPLANT
CLIP TI MEDIUM 6 (CLIP) ×2 IMPLANT
CLIP TI WIDE RED SMALL 6 (CLIP) ×4 IMPLANT
CLOTH BEACON ORANGE TIMEOUT ST (SAFETY) ×2 IMPLANT
COVER PROBE W GEL 5X96 (DRAPES) IMPLANT
COVER SURGICAL LIGHT HANDLE (MISCELLANEOUS) ×2 IMPLANT
DECANTER SPIKE VIAL GLASS SM (MISCELLANEOUS) IMPLANT
DERMABOND ADVANCED (GAUZE/BANDAGES/DRESSINGS) ×1
DERMABOND ADVANCED .7 DNX12 (GAUZE/BANDAGES/DRESSINGS) ×1 IMPLANT
DRAIN PENROSE 1/4X12 LTX STRL (WOUND CARE) ×2 IMPLANT
ELECT REM PT RETURN 9FT ADLT (ELECTROSURGICAL) ×2
ELECTRODE REM PT RTRN 9FT ADLT (ELECTROSURGICAL) ×1 IMPLANT
GAUZE SPONGE 2X2 8PLY STRL LF (GAUZE/BANDAGES/DRESSINGS) IMPLANT
GEL ULTRASOUND 20GR AQUASONIC (MISCELLANEOUS) IMPLANT
GLOVE BIO SURGEON STRL SZ 6.5 (GLOVE) ×4 IMPLANT
GLOVE BIO SURGEON STRL SZ7 (GLOVE) ×2 IMPLANT
GLOVE BIO SURGEON STRL SZ7.5 (GLOVE) ×2 IMPLANT
GLOVE BIOGEL PI IND STRL 6.5 (GLOVE) ×2 IMPLANT
GLOVE BIOGEL PI IND STRL 7.5 (GLOVE) ×2 IMPLANT
GLOVE BIOGEL PI IND STRL 8 (GLOVE) ×1 IMPLANT
GLOVE BIOGEL PI INDICATOR 6.5 (GLOVE) ×2
GLOVE BIOGEL PI INDICATOR 7.5 (GLOVE) ×2
GLOVE BIOGEL PI INDICATOR 8 (GLOVE) ×1
GLOVE ECLIPSE 6.5 STRL STRAW (GLOVE) ×2 IMPLANT
GLOVE SURG SS PI 7.5 STRL IVOR (GLOVE) ×6 IMPLANT
GOWN EXTRA PROTECTION XXL 0583 (GOWNS) ×2 IMPLANT
GOWN PREVENTION PLUS XLARGE (GOWN DISPOSABLE) ×2 IMPLANT
GOWN STRL NON-REIN LRG LVL3 (GOWN DISPOSABLE) ×4 IMPLANT
KIT BASIN OR (CUSTOM PROCEDURE TRAY) ×2 IMPLANT
KIT ROOM TURNOVER OR (KITS) ×2 IMPLANT
LOOP VESSEL MINI RED (MISCELLANEOUS) IMPLANT
NS IRRIG 1000ML POUR BTL (IV SOLUTION) ×2 IMPLANT
PACK CV ACCESS (CUSTOM PROCEDURE TRAY) ×2 IMPLANT
PAD ARMBOARD 7.5X6 YLW CONV (MISCELLANEOUS) ×4 IMPLANT
SPONGE GAUZE 2X2 STER 10/PKG (GAUZE/BANDAGES/DRESSINGS)
SPONGE SURGIFOAM ABS GEL 100 (HEMOSTASIS) IMPLANT
SUT PROLENE 7 0 BV 1 (SUTURE) ×4 IMPLANT
SUT VIC AB 3-0 SH 27 (SUTURE) ×1
SUT VIC AB 3-0 SH 27X BRD (SUTURE) ×1 IMPLANT
SUT VICRYL 4-0 PS2 18IN ABS (SUTURE) ×2 IMPLANT
TOWEL OR 17X24 6PK STRL BLUE (TOWEL DISPOSABLE) ×2 IMPLANT
TOWEL OR 17X26 10 PK STRL BLUE (TOWEL DISPOSABLE) ×2 IMPLANT
UNDERPAD 30X30 INCONTINENT (UNDERPADS AND DIAPERS) ×2 IMPLANT
WATER STERILE IRR 1000ML POUR (IV SOLUTION) ×2 IMPLANT

## 2012-01-10 NOTE — Progress Notes (Signed)
Patient is stable, comfortable, hungry. Minimal pain.  Abdomen is soft and benign. Wounds looked okay.  Apparently, they were able to infuse the  peritoneal dialysate yesterday, but the return was not good.  I suspect that there is no problem with the external component of the catheter, but because the catheter has been in place and unused for 12 months there may be a fibrin sheath internally around the catheter. I do not know what can be done about that short of revising the internal component.  It is okay to try to use this again, but if it continues to be a problem then revision will be mandatory unless nephrology decides to convert to hemodialysis.  I will ask  my partners who have some experience in this to give an opinion.   Angelia Mould. Derrell Lolling, M.D., [facility] General and Minimally invasive Surgery Breast and Colorectal Surgery Office:   [phone] Pager:   [phone]

## 2012-01-10 NOTE — Progress Notes (Signed)
Pt CBG 479 at bedtime. MD notified.

## 2012-01-10 NOTE — Interval H&P Note (Signed)
History and Physical Interval Note:  01/10/2012 10:38 AM  Mark Miranda  has presented today for surgery, with the diagnosis of End Stage Renal Disease  The various methods of treatment have been discussed with the patient and family. After consideration of risks, benefits and other options for treatment, the patient has consented to  Procedure(s) (LRB): ARTERIOVENOUS (AV) FISTULA CREATION (Left) as a surgical intervention .  The patients' history has been reviewed, patient examined, no change in status, stable for surgery.  I have reviewed the patients' chart and labs.  Questions were answered to the patient's satisfaction.     Mark Miranda E

## 2012-01-10 NOTE — Progress Notes (Signed)
Dr Katrinka Blazing notified of cbg 340.  New orders for IV and IM insulin.

## 2012-01-10 NOTE — Transfer of Care (Signed)
Immediate Anesthesia Transfer of Care Note  Patient: Mark Miranda  Procedure(s) Performed: Procedure(s) (LRB): ARTERIOVENOUS (AV) FISTULA CREATION (Left)  Patient Location: PACU  Anesthesia Type: General  Level of Consciousness: awake, oriented and patient cooperative  Airway & Oxygen Therapy: Patient Spontanous Breathing and Patient connected to nasal cannula oxygen  Post-op Assessment: Report given to PACU RN and Post -op Vital signs reviewed and stable  Post vital signs: Reviewed  Complications: No apparent anesthesia complications

## 2012-01-10 NOTE — Progress Notes (Signed)
CBG 438. Dr. Arthor Captain notified via text page. [place], Rosanna Randy

## 2012-01-10 NOTE — Telephone Encounter (Addendum)
Message copied by Rosalyn Charters on Tue Jan 10, 2012  2:18 PM ------      Message from: Sherren Kerns      Created: Tue Jan 10, 2012 12:14 PM       Left brachial cephalic AVF      Rhyne asst      Needs follow up in 1 month            Charles  NOTIFIED DAUGHTER, STEPHANIE, OF FU APPT. ON 02-16-12 3:30PM

## 2012-01-10 NOTE — Anesthesia Postprocedure Evaluation (Signed)
  Anesthesia Post-op Note  Patient: Mark Miranda  Procedure(s) Performed: Procedure(s) (LRB): ARTERIOVENOUS (AV) FISTULA CREATION (Left)  Patient Location: PACU  Anesthesia Type: General  Level of Consciousness: awake, alert , oriented and patient cooperative  Airway and Oxygen Therapy: Patient Spontanous Breathing and Patient connected to nasal cannula oxygen  Post-op Pain: mild  Post-op Assessment: Post-op Vital signs reviewed, Patient's Cardiovascular Status Stable, Respiratory Function Stable, Patent Airway, No signs of Nausea or vomiting and Pain level controlled  Post-op Vital Signs: stable  Complications: No apparent anesthesia complications 

## 2012-01-10 NOTE — Progress Notes (Signed)
Fill time 2 hours

## 2012-01-10 NOTE — Addendum Note (Signed)
Addendum  created 01/10/12 1312 by Agustus Mane Edward Vedanshi Massaro, MD   Modules edited:Anesthesia Attestations, Notes Section    

## 2012-01-10 NOTE — Op Note (Signed)
Procedure: Left Brachial Cephalic AV fistula  Preop: ESRD  Postop: ESRD  Anesthesia: General  Assistant:Samantha Rhyne, PA-C  Findings: 3.5 mm cephalic vein  Procedure: After obtaining informed consent, the patient was taken to the operating room.  After induction of general anesthesia, the left upper extremity was prepped and draped in usual sterile fashion.  A transverse incision was then made near the antecubital crease the left arm. The incision was carried into the subcutaneous tissues down to level of the cephalic vein. The cephalic vein was approximately 3.5 mm in diameter. It was of good quality. This was dissected free circumferentially and small side branches ligated and divided between silk ties or clips. Next the brachial artery was dissected free in the medial portion of the incision. The artery was  3-4 mm in diameter but it also had moderate calcification and some spasm. The vessel loops were placed proximal and distal to the planned site of arteriotomy. The patient was given 5000 units of intravenous heparin. After appropriate circulation time, the vessel loops were used to control the artery. A longitudinal opening was made in the brachial artery.  The vein was ligated distally with a 2-0 silk tie. The vein was controlled proximally with a fine bulldog clamp. The vein was then swung over to the artery and sewn end of vein to side of artery using a running 7-0 Prolene suture. Just prior to completion of the anastomosis, everything was fore bled back bled and thoroughly flushed. The anastomosis was secured, vessel loops released, and there was a palpable thrill in the fistula immediately. After hemostasis was obtained, the subcutaneous tissues were reapproximated using a running 3-0 Vicryl suture. The skin was then closed with a 4 Vicryl subcuticular stitch. Dermabond was applied to the skin incision.  The patient had a palpable radial pulse at the end of the case.  Fabienne Bruns,  MD [facility] [place] Office: [phone] Pager: [phone]

## 2012-01-10 NOTE — Anesthesia Procedure Notes (Signed)
Procedure Name: LMA Insertion Date/Time: 01/10/2012 10:50 AM Performed by: Romie Minus K Pre-anesthesia Checklist: Patient identified, Emergency Drugs available, Suction available, Patient being monitored and Timeout performed Patient Re-evaluated:Patient Re-evaluated prior to inductionOxygen Delivery Method: Circle system utilized Preoxygenation: Pre-oxygenation with 100% oxygen Intubation Type: IV induction Ventilation: Mask ventilation without difficulty LMA: LMA inserted LMA Size: 4.0 Number of attempts: 1 Placement Confirmation: positive ETCO2 and breath sounds checked- equal and bilateral Tube secured with: Tape Dental Injury: Teeth and Oropharynx as per pre-operative assessment

## 2012-01-10 NOTE — Progress Notes (Signed)
capd  Exchange    Last pm only 750cc  Dialysate  Infused /heparinized  Solution  Per  Order.

## 2012-01-10 NOTE — Progress Notes (Signed)
DAILY PROGRESS NOTE                              GENERAL INTERNAL MEDICINE [facility]  SUBJECTIVE: Denies any complaint, had his left brachiocephalic AVF placed today  OBJECTIVE: BP 152/84  Pulse 80  Temp(Src) 98.1 F (36.7 C) (Oral)  Resp 18  Ht 5\' 8"  (1.727 m)  Wt 61.9 kg (136 lb 7.4 oz)  BMI 20.75 kg/m2  SpO2 96%  Intake/Output Summary (Last 24 hours) at 01/10/12 1424 Last data filed at 01/10/12 1300  Gross per 24 hour  Intake    585 ml  Output      0 ml  Net    585 ml                      Weight change: -0.424 kg (-15 oz) Physical Exam: General: Alert and awake oriented x3 not in any acute distress. HEENT: anicteric sclera, pupils equal reactive to light and accommodation CVS: S1-S2 heard, no murmur rubs or gallops Chest: clear to auscultation bilaterally, no wheezing rales or rhonchi Abdomen:  normal bowel sounds, soft, nontender, nondistended, no organomegaly Neuro: Cranial nerves II-XII intact, no focal neurological deficits Extremities: no cyanosis, no clubbing or edema noted bilaterally   Lab Results:  Basename 01/10/12 0625 01/09/12 1233 01/09/12 0844  NA 138 -- 139  K 4.4 -- 4.9  CL 100 -- 101  CO2 20 -- 18*  GLUCOSE 200* -- 211*  BUN 117* -- 121*  CREATININE 11.11* 11.91* --  CALCIUM 7.9* -- 7.8*  MG -- -- --  PHOS 5.8* -- --    Basename 01/10/12 0625  AST --  ALT --  ALKPHOS --  BILITOT --  PROT --  ALBUMIN 3.3*   No results found for this basename: LIPASE:2,AMYLASE:2 in the last 72 hours  Basename 01/09/12 1233  WBC 10.5  NEUTROABS --  HGB 10.4*  HCT 29.7*  MCV 85.3  PLT 210   No results found for this basename: CKTOTAL:3,CKMB:3,CKMBINDEX:3,TROPONINI:3 in the last 72 hours No components found with this basename: POCBNP:3 No results found for this basename: DDIMER:2 in the last 72 hours No results found for this basename: HGBA1C:2 in the last 72 hours No results found for this basename:  CHOL:2,HDL:2,LDLCALC:2,TRIG:2,CHOLHDL:2,LDLDIRECT:2 in the last 72 hours No results found for this basename: TSH,T4TOTAL,FREET3,T3FREE,THYROIDAB in the last 72 hours No results found for this basename: VITAMINB12:2,FOLATE:2,FERRITIN:2,TIBC:2,IRON:2,RETICCTPCT:2 in the last 72 hours  Micro Results: Recent Results (from the past 240 hour(s))  SURGICAL PCR SCREEN     Status: Normal   Collection Time   01/06/12  1:44 PM      Component Value Range Status Comment   MRSA, PCR NEGATIVE  NEGATIVE  Final    Staphylococcus aureus NEGATIVE  NEGATIVE  Final     Studies/Results: Dg Chest 2 View  01/06/2012  *RADIOLOGY REPORT*  Clinical Data: Weakness, renal disorder, needs dialysis  CHEST - 2 VIEW  Comparison: 11/30/2010  Findings: Very mild patchy bilateral lower lobe opacities, possibly infectious, equivocal. No pleural effusion or pneumothorax.  Cardiomediastinal silhouette is within normal limits.  Visualized osseous structures are within normal limits.  IMPRESSION: Very mild patchy bilateral lower lobe opacities, possibly infectious, equivocal.  Original Report Authenticated By: Charline Bills, M.D.   [place] Misc Soft Tissue  12/22/2011  *RADIOLOGY REPORT*  Clinical Data: Cyst on the right elbow.  ULTRASOUND RIGHT UPPER EXTREMITY COMPLETE  Technique:  Ultrasound  examination of the right elbow was performed including evaluation of the muscles, tendons, joint, and adjacent soft tissues.  Comparison:  None.  Findings: There is a 3.7 x 2.3 x 3.0 cm complex fluid collection in the olecranon region of the elbow.  This likely reflects olecranon bursitis complicated by hemorrhage or infection.  IMPRESSION: 3.7 x 2.3 x 3.0 cm complex fluid collection in the olecranon bursa, most likely hemorrhagic olecranon bursitis.  Infection would also be possible.  Aspiration is recommended.  Original Report Authenticated By: P. Loralie Champagne, M.D.   Medications: Scheduled Meds:    . amLODipine  10 mg Oral QHS  .  atorvastatin  20 mg Oral q1800  . calcium carbonate  1 tablet Oral TID  . darbepoetin (ARANESP) injection - DIALYSIS  40 mcg Subcutaneous Q Mon-HD  . dextrose      . dialysis solution for CAPD/CCPD [facility])   Peritoneal Dialysis Once in dialysis  . heparin  3,000 Units Intraperitoneal Once in dialysis  . heparin  5,000 Units Subcutaneous Q8H  . insulin aspart  0-5 Units Subcutaneous QHS  . insulin aspart  0-9 Units Subcutaneous TID WC  . insulin aspart  7 Units Intravenous Once  . insulin aspart  7 Units Subcutaneous Once  . insulin aspart protamine-insulin aspart  15 Units Subcutaneous Q supper  . insulin aspart protamine-insulin aspart  30 Units Subcutaneous Q breakfast  . multivitamin  1 tablet Oral Daily  . sodium bicarbonate  1,300 mg Oral BID  . DISCONTD: dialysis solution for CAPD/CCPD [facility])   Peritoneal Dialysis Once in dialysis  . DISCONTD: heparin  1,000 Units Intraperitoneal Once in dialysis  . DISCONTD: heparin  5,000 Units Subcutaneous Q8H  . DISCONTD: heparin  500 Units Intraperitoneal Once in dialysis   Continuous Infusions:    . sodium chloride 20 mL/hr (01/10/12 0953)   PRN Meds:.acetaminophen, acetaminophen, HYDROcodone-acetaminophen, HYDROmorphone (DILAUDID) injection, morphine injection, ondansetron (ZOFRAN) IV, ondansetron (ZOFRAN) IV, ondansetron (ZOFRAN) IV, ondansetron, ondansetron, oxyCODONE-acetaminophen, DISCONTD: 0.9 % irrigation (POUR BTL), DISCONTD: heparin 6000 unit irrigation, DISCONTD: heparin lock flush, DISCONTD: lidocaine-EPINEPHrine, DISCONTD: sodium bicarbonate  ASSESSMENT & PLAN: Principal Problem:  *End stage renal disease Active Problems:  HTN (hypertension)  DM (diabetes mellitus) type I uncontrolled with renal manifestation  Leukocytosis  Hypoglycemia  Uremia   ESRD -Patient was sent from his nephrologist office to initiate hemodialysis. -After discussion with his nephrologist, decision was made to start peritoneal dialysis using  his PD catheter. -General surgery exteriorized the catheter yesterday. -Peritoneal dialysis started last night, the catheter wasn't functioning well without a good return. -Patient had left brachiocephalic AV fistula placed by Dr. Darrick Penna today, this being scheduled earlier, it was supposed to be outpatient procedure. -Awaits nephrology recommendation regarding hemodialysis versus peritoneal dialysis.  Leukocytosis -With marked neutrophilia, but this seems chronic. -No signs of infections. And apparently resolved now, without intervention.  Diabetes mellitus type 1 -Restarted back on his insulin. Hemoglobin A1c is 5.8.  Hypertension -Controlled, continue preadmission medications  Disposition -Stays as inpatient, case manager to followup on his emergency Medicaid priority number   LOS: 4 days   Kerryann Allaire A 01/10/2012, 2:24 PM

## 2012-01-10 NOTE — H&P (View-Only) (Signed)
[facility] [place] HISTORY AND PHYSICAL   History of Present Illness:  Patient is a 41 y.o. year old male who presents for placement of a permanent hemodialysis access. The patient is right handed .  The patient is not currently on hemodialysis.  The cause of renal failure is thought to be secondary to hypertension diabetes.  Other chronic medical problems include elevated cholesterol.  Past Medical History  Diagnosis Date  . Diabetes mellitus   . Renal disorder   . Hypertension   . Hypercholesterolemia     Past Surgical History  Procedure Date  . Peritoneal catheter insertion      Social History History  Substance Use Topics  . Smoking status: Current Everyday Smoker -- 1.0 packs/day    Types: Cigarettes  . Smokeless tobacco: Never Used  . Alcohol Use: No    Family History Family History  Problem Relation Age of Onset  . Diabetes Mother     Allergies  No Known Allergies   Current Outpatient Prescriptions  Medication Sig Dispense Refill  . amLODipine (NORVASC) 10 MG tablet Take 10 mg by mouth at bedtime.      . calcium acetate, Phos Binder, (PHOSLYRA) 667 MG/5ML SOLN Take 667 mg by mouth 3 (three) times daily with meals.      . diltiazem (TIAZAC) 240 MG 24 hr capsule Take 240 mg by mouth daily.      . insulin aspart protamine-insulin aspart (NOVOLOG 70/30) (70-30) 100 UNIT/ML injection Inject 15-30 Units into the skin 2 (two) times daily with a meal. Inject 30 units in the morning, and inject 15 units at night.      . rosuvastatin (CRESTOR) 10 MG tablet Take 10 mg by mouth at bedtime.      . sildenafil (VIAGRA) 50 MG tablet Take 50 mg by mouth daily as needed. For E.D.      . DISCONTD: diltiazem (CARDIZEM) 30 MG tablet Take 30 mg by mouth 4 (four) times daily. Ask patient to verify dosage and frequency. Not indicated on medical history form dated 10/23/10.       . DISCONTD: insulin lispro (HUMALOG) 100 UNIT/ML injection Inject into the skin 3  (three) times daily before meals. Ask patient to verify name/dosage/. History form noted dosage of 34 units during day and 22 units at night.         ROS:   General:  No weight loss, Fever, chills  HEENT: No recent headaches, no nasal bleeding, no visual changes, no sore throat  Neurologic: No dizziness, blackouts, seizures. No recent symptoms of stroke or mini- stroke. No recent episodes of slurred speech, or temporary blindness.  Cardiac: No recent episodes of chest pain/pressure, no shortness of breath at rest.  No shortness of breath with exertion.  Denies history of atrial fibrillation or irregular heartbeat  Vascular: No history of rest pain in feet.  No history of claudication.  No history of non-healing ulcer, No history of DVT   Pulmonary: No home oxygen, no productive cough, no hemoptysis,  No asthma or wheezing  Musculoskeletal:  [ ] Arthritis, [ ] Low back pain,  [ ] Joint pain  Hematologic:No history of hypercoagulable state.  No history of easy bleeding.  No history of anemia  Gastrointestinal: No hematochezia or melena,  No gastroesophageal reflux, no trouble swallowing  Urinary: [x ] chronic Kidney disease, [ ] on HD - [ ] MWF or [ ] TTHS, [ ] Burning with urination, [ ] Frequent urination, [ ]   Difficulty urinating;   Skin: No rashes  Psychological: No history of anxiety,  No history of depression   Physical Examination  Filed Vitals:   12/29/11 1551  BP: 148/84  Pulse: 73  Temp: 98.1 F (36.7 C)  TempSrc: Oral  Resp: 20  Height: 5' 8" (1.727 m)  Weight: 130 lb (58.968 kg)    Body mass index is 19.77 kg/(m^2).  General:  Alert and oriented, no acute distress HEENT: Normal Neck: No bruit or JVD Pulmonary: Clear to auscultation bilaterally Cardiac: Regular Rate and Rhythm without murmur Gastrointestinal: Soft, non-tender, non-distended Skin: No rash Extremity Pulses:  2+ radial, brachial pulses bilaterally Musculoskeletal: No deformity or  edema  Neurologic: Upper and lower extremity motor 5/5 and symmetric  DATA: Patient had a bilateral upper extremity vein mapping today which I reviewed and interpreted. Shows the cephalic vein on the right arm is less than 2 mm throughout most of its course the basilic vein was 2-3 mm on the right 2-1/2 mm in the upper arm on the left the left cephalic vein was between 25 and 30 mm but of larger caliber in the upper arm   ASSESSMENT: Needs hemodialysis access    PLAN:  Left brachiocephalic AV fistula scheduled for 01/10/2012. Risk, benefits, and alternatives to access surgery were discussed.  The patient is aware the risks include but are not limited to: bleeding, infection, steal syndrome, nerve damage, ischemic neuropathy, failure to mature, and need for additional procedures. The patient agrees to proceed.  Roneshia Drew, MD [facility] [place] Office: [phone] Pager: [phone]  

## 2012-01-10 NOTE — Anesthesia Preprocedure Evaluation (Signed)
Anesthesia Evaluation  Patient identified by MRN, date of birth, ID band Patient awake    Reviewed: Allergy & Precautions, H&P , NPO status , Patient's Chart, lab work & pertinent test results  Airway Mallampati: I TM Distance: >3 FB Neck ROM: full    Dental   Pulmonary          Cardiovascular hypertension, Rhythm:regular Rate:Normal     Neuro/Psych    GI/Hepatic   Endo/Other  Diabetes mellitus-  Renal/GU ESRF, CRF and Dialysis     Musculoskeletal   Abdominal   Peds  Hematology   Anesthesia Other Findings   Reproductive/Obstetrics                           Anesthesia Physical Anesthesia Plan  ASA: III  Anesthesia Plan: General   Post-op Pain Management:    Induction: Intravenous  Airway Management Planned: LMA  Additional Equipment:   Intra-op Plan:   Post-operative Plan: Extubation in OR  Informed Consent: I have reviewed the patients History and Physical, chart, labs and discussed the procedure including the risks, benefits and alternatives for the proposed anesthesia with the patient or authorized representative who has indicated his/her understanding and acceptance.     Plan Discussed with: CRNA, Anesthesiologist and Surgeon  Anesthesia Plan Comments:         Anesthesia Quick Evaluation

## 2012-01-10 NOTE — Addendum Note (Signed)
Addendum  created 01/10/12 1312 by Rivka Barbara, MD   Modules edited:Anesthesia Attestations, Notes Section

## 2012-01-10 NOTE — Progress Notes (Signed)
capd  Unable  To  Drain   Effluent   Patient  Repositioned numerous  Times    No  Out  flow

## 2012-01-10 NOTE — Progress Notes (Signed)
1 Day Post-Op  Subjective: Resting comfortably in bed, no complaints offered by patient.  Objective: Vital signs in last 24 hours: Temp:  [97.9 F (36.6 C)-98.7 F (37.1 C)] 97.9 F (36.6 C) (06/04 0502) Pulse Rate:  [70-83] 82  (06/04 0502) Resp:  [16-18] 16  (06/04 0502) BP: (146-175)/(79-87) 146/79 mmHg (06/04 0502) SpO2:  [96 %-100 %] 96 % (06/04 0502) Weight:  [136 lb 7.4 oz (61.9 kg)] 136 lb 7.4 oz (61.9 kg) (06/03 2225) Last BM Date: 01/07/12  Intake/Output from previous day:  06/03 0701 - 06/04 0700 In: 240 [P.O.:240] Out: -  Intake/Output this shift:   Physical Exam: Abdomen soft, non tender, positive bowel sounds. Catheter site clean,dry, intact.   Lab Results:   [facility] 01/09/12 1233  WBC 10.5  HGB 10.4*  HCT 29.7*  PLT 210   BMET  Basename 01/10/12 0625 01/09/12 1233 01/09/12 0844  NA 138 -- 139  K 4.4 -- 4.9  CL 100 -- 101  CO2 20 -- 18*  GLUCOSE 200* -- 211*  BUN 117* -- 121*  CREATININE 11.11* 11.91* --  CALCIUM 7.9* -- 7.8*   PT/INR No results found for this basename: LABPROT:2,INR:2 in the last 72 hours ABG No results found for this basename: PHART:2,PCO2:2,PO2:2,HCO3:2 in the last 72 hours  Studies/Results: No results found.  Anti-infectives: Anti-infectives     Start     Dose/Rate Route Frequency Ordered Stop   01/08/12 1936   ceFAZolin (ANCEF) IVPB 1 g/50 mL premix  Status:  Discontinued        1 g 100 mL/hr over 30 Minutes Intravenous 60 min pre-op 01/08/12 1936 01/09/12 1037   01/07/12 0800   ceFAZolin (ANCEF) IVPB 1 g/50 mL premix     Comments: GIVE ON CALL TO IR 6/1 FOR DIALYSIS CATHETER PLACEMENT      1 g 100 mL/hr over 30 Minutes Intravenous On call 01/06/12 1706 01/07/12 1201   01/06/12 2200   ceFAZolin (ANCEF) IVPB 1 g/50 mL premix  Status:  Discontinued        1 g 100 mL/hr over 30 Minutes Intravenous  Once 01/06/12 2004 01/08/12 1939          Assessment/Plan: s/p Procedure(s) (LRB): CONTINUOUS AMBULATORY  PERITONEAL DIALYSIS  (CAPD) CATHETER INSERTION (N/A) 1. ESRD  Plan:  1. Hold peritoneal dialysis for now secondary to restricted outflow from peritoneal dialysis catheter. D/W Dr. Derrell Lolling who will speak with Dr. Adriana Mccallum concerning corrective options.  LOS: 4 days    Mark Miranda 01/10/2012

## 2012-01-10 NOTE — Anesthesia Postprocedure Evaluation (Signed)
  Anesthesia Post-op Note  Patient: Mark Miranda  Procedure(s) Performed: Procedure(s) (LRB): ARTERIOVENOUS (AV) FISTULA CREATION (Left)  Patient Location: PACU  Anesthesia Type: General  Level of Consciousness: awake, alert , oriented and patient cooperative  Airway and Oxygen Therapy: Patient Spontanous Breathing and Patient connected to nasal cannula oxygen  Post-op Pain: mild  Post-op Assessment: Post-op Vital signs reviewed, Patient's Cardiovascular Status Stable, Respiratory Function Stable, Patent Airway, No signs of Nausea or vomiting and Pain level controlled  Post-op Vital Signs: stable  Complications: No apparent anesthesia complications

## 2012-01-10 NOTE — Progress Notes (Signed)
S: Feels well.  Good appetite.  No overt uremic sxs O:BP 152/84  Pulse 80  Temp(Src) 98.1 F (36.7 C) (Oral)  Resp 18  Ht 5\' 8"  (1.727 m)  Wt 61.9 kg (136 lb 7.4 oz)  BMI 20.75 kg/m2  SpO2 96%  Intake/Output Summary (Last 24 hours) at 01/10/12 1452 Last data filed at 01/10/12 1300  Gross per 24 hour  Intake    585 ml  Output      0 ml  Net    585 ml   Weight change: -0.424 kg (-15 oz) ZOX:WRUEA and alert mild periorbital edema CVS:RRR with 2-3/6 systolic murmur Resp:clear Abd:+BS NTND  PD cath exteriorized Ext:no edema  Lt UA AVF + bruit NEURO: CNI  Ox3  I flushed the PD cath with a syringe of saline and tried to aspirate any fibrin clots. No clots aspirated No results found. BMET    Component Value Date/Time   NA 138 01/10/2012 0625   K 4.4 01/10/2012 0625   CL 100 01/10/2012 0625   CO2 20 01/10/2012 0625   GLUCOSE 200* 01/10/2012 0625   BUN 117* 01/10/2012 0625   CREATININE 11.11* 01/10/2012 0625   CALCIUM 7.9* 01/10/2012 0625   CALCIUM 8.2* 09/17/2010 2201   GFRNONAA 5* 01/10/2012 0625   GFRAA 6* 01/10/2012 0625   CBC    Component Value Date/Time   WBC 10.5 01/09/2012 1233   RBC 3.48* 01/09/2012 1233   HGB 10.4* 01/09/2012 1233   HCT 29.7* 01/09/2012 1233   PLT 210 01/09/2012 1233   MCV 85.3 01/09/2012 1233   MCH 29.9 01/09/2012 1233   MCHC 35.0 01/09/2012 1233   RDW 13.9 01/09/2012 1233   LYMPHSABS 1.2 01/06/2012 1433   MONOABS 0.8 01/06/2012 1433   EOSABS 0.2 01/06/2012 1433   BASOSABS 0.0 01/06/2012 1433     Assessment: 1. ESRD 2. HTN 3. DM 4. Anemia on aranesp Plan: 1. Will try another exchange and see if works.  If not the will give laxatives and check position of catheter with XR. 2.  He says he is a citizen so he should be able to get medicaid 3. If PD cath remains non functional then may need manipulation vs placement of permcath for HD 4. PTH level pending 5. Check iron studies Mark Miranda T

## 2012-01-10 NOTE — Preoperative (Signed)
Beta Blockers   Reason not to administer Beta Blockers:Not Applicable 

## 2012-01-11 ENCOUNTER — Other Ambulatory Visit: Payer: Self-pay | Admitting: *Deleted

## 2012-01-11 ENCOUNTER — Encounter (HOSPITAL_COMMUNITY): Payer: Self-pay | Admitting: Vascular Surgery

## 2012-01-11 DIAGNOSIS — Z4931 Encounter for adequacy testing for hemodialysis: Secondary | ICD-10-CM

## 2012-01-11 DIAGNOSIS — N186 End stage renal disease: Secondary | ICD-10-CM

## 2012-01-11 DIAGNOSIS — D7289 Other specified disorders of white blood cells: Secondary | ICD-10-CM

## 2012-01-11 DIAGNOSIS — E1069 Type 1 diabetes mellitus with other specified complication: Secondary | ICD-10-CM

## 2012-01-11 DIAGNOSIS — E1029 Type 1 diabetes mellitus with other diabetic kidney complication: Secondary | ICD-10-CM

## 2012-01-11 LAB — RENAL FUNCTION PANEL
CO2: 23 mEq/L (ref 19–32)
Calcium: 8 mg/dL — ABNORMAL LOW (ref 8.4–10.5)
Creatinine, Ser: 11.4 mg/dL — ABNORMAL HIGH (ref 0.50–1.35)
Glucose, Bld: 49 mg/dL — ABNORMAL LOW (ref 70–99)
Phosphorus: 6 mg/dL — ABNORMAL HIGH (ref 2.3–4.6)

## 2012-01-11 LAB — GLUCOSE, CAPILLARY
Glucose-Capillary: 99 mg/dL (ref 70–99)
Glucose-Capillary: 105 mg/dL — ABNORMAL HIGH (ref 70–99)

## 2012-01-11 LAB — FERRITIN: Ferritin: 118 ng/mL (ref 22–322)

## 2012-01-11 MED ORDER — INSULIN ASPART PROT & ASPART (70-30 MIX) 100 UNIT/ML ~~LOC~~ SUSP
20.0000 [IU] | Freq: Every day | SUBCUTANEOUS | Status: DC
Start: 2012-01-12 — End: 2012-01-14
  Administered 2012-01-12 – 2012-01-14 (×3): 20 [IU] via SUBCUTANEOUS

## 2012-01-11 MED ORDER — INSULIN ASPART PROT & ASPART (70-30 MIX) 100 UNIT/ML ~~LOC~~ SUSP
10.0000 [IU] | Freq: Every day | SUBCUTANEOUS | Status: DC
  Administered 2012-01-11 – 2012-01-13 (×3): 10 [IU] via SUBCUTANEOUS
  Filled 2012-01-11: qty 3

## 2012-01-11 NOTE — Progress Notes (Signed)
S: Feels well.  Good appetite.  No overt uremic sxs O:BP 144/75  Pulse 83  Temp(Src) 98.6 F (37 C) (Oral)  Resp 18  Ht 5\' 8"  (1.727 m)  Wt 63.9 kg (140 lb 14 oz)  BMI 21.42 kg/m2  SpO2 96%  Intake/Output Summary (Last 24 hours) at 01/11/12 1023 Last data filed at 01/11/12 0900  Gross per 24 hour  Intake 1615.67 ml  Output      0 ml  Net 1615.67 ml   Weight change: 2 kg (4 lb 6.5 oz) ZOX:WRUEA and alert mild  CVS:RRR with 2-3/6 systolic murmur Resp:clear Abd:+BS NTND  PD cath exteriorized Ext:no edema  Lt UA AVF + bruit NEURO: CNI  Ox3  Dg Abd 1 View  01/10/2012  *RADIOLOGY REPORT*  Clinical Data: Assess position of peritoneal dialysis catheter  ABDOMEN - 1 VIEW  Comparison: None.  Findings: Peritoneal dialysis catheter is present in the right lower quadrant and it is coiled in the right side of the pelvis. No disproportionate dilatation of bowel.  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  No obvious free intraperitoneal gas.  IMPRESSION: Peritoneal dialysis catheter.  Nonobstructive bowel gas pattern.  Original Report Authenticated By: Donavan Burnet, M.D.   BMET    Component Value Date/Time   NA 141 01/11/2012 0605   K 4.1 01/11/2012 0605   CL 102 01/11/2012 0605   CO2 23 01/11/2012 0605   GLUCOSE 49* 01/11/2012 0605   BUN 120* 01/11/2012 0605   CREATININE 11.40* 01/11/2012 0605   CALCIUM 8.0* 01/11/2012 0605   CALCIUM 8.2* 09/17/2010 2201   GFRNONAA 5* 01/11/2012 0605   GFRAA 6* 01/11/2012 0605   CBC    Component Value Date/Time   WBC 10.5 01/09/2012 1233   RBC 3.48* 01/09/2012 1233   HGB 10.4* 01/09/2012 1233   HCT 29.7* 01/09/2012 1233   PLT 210 01/09/2012 1233   MCV 85.3 01/09/2012 1233   MCH 29.9 01/09/2012 1233   MCHC 35.0 01/09/2012 1233   RDW 13.9 01/09/2012 1233   LYMPHSABS 1.2 01/06/2012 1433   MONOABS 0.8 01/06/2012 1433   EOSABS 0.2 01/06/2012 1433   BASOSABS 0.0 01/06/2012 1433     Assessment: 1. ESRD 2. HTN 3. DM 4. Anemia on aranesp Plan: 1. XR shows  cath in correct place in the pelvis but there is a lot of stool.  He is getting laxatives to help.  If catheter still does not function after BM's then will need manipulation but that will depend on his Medicaid status as if he only gets emergency medicaid then he will not be able to do PD anyway and the PD cath will just need to be removed. Mark Miranda T

## 2012-01-11 NOTE — Progress Notes (Signed)
[facility] Progress Note  01/11/2012   Subjective: Pt had a low BS this morning (49).  Pt says he has had a difficult time eating the renal diet.  He has not been eating well.  Still with no BM.  Started taking sorbitol last evening.    Objective:  Vital signs in last 24 hours: Filed Vitals:   01/10/12 1800 01/10/12 2201 01/11/12 0517 01/11/12 1000  BP: 165/88 163/86 137/78 144/75  Pulse: 90 89 77 83  Temp: 98.4 F (36.9 C) 98.8 F (37.1 C) 97.6 F (36.4 C) 98.6 F (37 C)  TempSrc: Oral Oral Oral Oral  Resp: 18 18 18 18   Height:  5\' 8"  (1.727 m)    Weight:  63.9 kg (140 lb 14 oz)    SpO2: 96% 97% 96% 96%   Weight change: 2 kg (4 lb 6.5 oz)  Intake/Output Summary (Last 24 hours) at 01/11/12 1311 Last data filed at 01/11/12 0900  Gross per 24 hour  Intake 1030.67 ml  Output      0 ml  Net 1030.67 ml   Lab Results  Component Value Date   HGBA1C 5.8* 01/06/2012   HGBA1C 6.6* 02/18/2010   Lab Results  Component Value Date   MICROALBUR 207.00* 04/15/2008   LDLCALC 107* 09/17/2010   CREATININE 11.40* 01/11/2012    Review of Systems As above, otherwise all reviewed and reported negative  Physical Exam General: Alert and awake oriented x3 not in any acute distress.  HEENT: anicteric sclera, pupils equal reactive to light and accommodation  CVS: S1-S2 heard, 2/6 systolic murmur Chest: clear to auscultation bilaterally, no wheezing rales or rhonchi  Abdomen: port in place, wound clean and dry, normal bowel sounds, soft, nontender, nondistended, no organomegaly  Neuro: Cranial nerves II-XII intact, no focal neurological deficits  Extremities: no cyanosis, no clubbing or edema noted bilaterally  Lab Results: Results for orders placed during the hospital encounter of 01/06/12 (from the past 24 hour(s))  GLUCOSE, CAPILLARY     Status: Abnormal   Collection Time   01/10/12  2:23 PM      Component Value Range   Glucose-Capillary 303 (*) 70 - 99 (mg/dL)  IRON AND TIBC      Status: Abnormal   Collection Time   01/10/12  4:36 PM      Component Value Range   Iron 138 (*) 42 - 135 (ug/dL)   TIBC 161  096 - 045 (ug/dL)   Saturation Ratios 61 (*) 20 - 55 (%)   UIBC 88 (*) 125 - 400 (ug/dL)  FERRITIN     Status: Normal   Collection Time   01/10/12  4:36 PM      Component Value Range   Ferritin 118  22 - 322 (ng/mL)  GLUCOSE, CAPILLARY     Status: Abnormal   Collection Time   01/10/12  5:12 PM      Component Value Range   Glucose-Capillary 438 (*) 70 - 99 (mg/dL)   Comment 1 Documented in Chart     Comment 2 Notify RN    GLUCOSE, CAPILLARY     Status: Abnormal   Collection Time   01/10/12  9:50 PM      Component Value Range   Glucose-Capillary 479 (*) 70 - 99 (mg/dL)   Comment 1 Documented in Chart     Comment 2 Notify RN    BASIC METABOLIC PANEL     Status: Abnormal   Collection Time   01/10/12 10:30 PM  Component Value Range   Sodium 133 (*) 135 - 145 (mEq/L)   Potassium 4.5  3.5 - 5.1 (mEq/L)   Chloride 98  96 - 112 (mEq/L)   CO2 21  19 - 32 (mEq/L)   Glucose, Bld 296 (*) 70 - 99 (mg/dL)   BUN 161 (*) 6 - 23 (mg/dL)   Creatinine, Ser 09.60 (*) 0.50 - 1.35 (mg/dL)   Calcium 7.8 (*) 8.4 - 10.5 (mg/dL)   GFR calc non Af Amer 5 (*) >90 (mL/min)   GFR calc Af Amer 6 (*) >90 (mL/min)  RENAL FUNCTION PANEL     Status: Abnormal   Collection Time   01/11/12  6:05 AM      Component Value Range   Sodium 141  135 - 145 (mEq/L)   Potassium 4.1  3.5 - 5.1 (mEq/L)   Chloride 102  96 - 112 (mEq/L)   CO2 23  19 - 32 (mEq/L)   Glucose, Bld 49 (*) 70 - 99 (mg/dL)   BUN 454 (*) 6 - 23 (mg/dL)   Creatinine, Ser 09.81 (*) 0.50 - 1.35 (mg/dL)   Calcium 8.0 (*) 8.4 - 10.5 (mg/dL)   Phosphorus 6.0 (*) 2.3 - 4.6 (mg/dL)   Albumin 3.4 (*) 3.5 - 5.2 (g/dL)   GFR calc non Af Amer 5 (*) >90 (mL/min)   GFR calc Af Amer 6 (*) >90 (mL/min)  GLUCOSE, CAPILLARY     Status: Normal   Collection Time   01/11/12  8:00 AM      Component Value Range   Glucose-Capillary 99  70 -  99 (mg/dL)   Comment 1 Documented in Chart     Comment 2 Notify RN    GLUCOSE, CAPILLARY     Status: Abnormal   Collection Time   01/11/12 12:00 PM      Component Value Range   Glucose-Capillary 157 (*) 70 - 99 (mg/dL)   Comment 1 Documented in Chart     Comment 2 Notify RN      Micro Results: Recent Results (from the past 240 hour(s))  SURGICAL PCR SCREEN     Status: Normal   Collection Time   01/06/12  1:44 PM      Component Value Range Status Comment   MRSA, PCR NEGATIVE  NEGATIVE  Final    Staphylococcus aureus NEGATIVE  NEGATIVE  Final     Medications:  Scheduled Meds:   . amLODipine  10 mg Oral QHS  . atorvastatin  20 mg Oral q1800  . calcium carbonate  1 tablet Oral TID  . darbepoetin (ARANESP) injection - DIALYSIS  40 mcg Subcutaneous Q Mon-HD  . dialysis solution for CAPD/[facility]PD [facility])   Peritoneal Dialysis Once  . heparin  5,000 Units Subcutaneous Q8H  . insulin aspart  0-9 Units Subcutaneous TID WC  . insulin aspart protamine-insulin aspart  10 Units Subcutaneous Q supper  . insulin aspart protamine-insulin aspart  20 Units Subcutaneous Q breakfast  . multivitamin  1 tablet Oral Daily  . sodium bicarbonate  1,300 mg Oral BID  . DISCONTD: heparin  5,000 Units Subcutaneous Q8H  . DISCONTD: insulin aspart  0-5 Units Subcutaneous QHS  . DISCONTD: insulin aspart protamine-insulin aspart  15 Units Subcutaneous Q supper  . DISCONTD: insulin aspart protamine-insulin aspart  30 Units Subcutaneous Q breakfast   Continuous Infusions:   . sodium chloride 20 mL/hr (01/10/12 0953)   PRN Meds:.acetaminophen, acetaminophen, HYDROcodone-acetaminophen, HYDROmorphone (DILAUDID) injection, ondansetron (ZOFRAN) IV, ondansetron (ZOFRAN) IV, ondansetron, ondansetron, oxyCODONE-acetaminophen,  sorbitol, DISCONTD:  HYDROmorphone (DILAUDID) injection, DISCONTD:  morphine injection, DISCONTD: ondansetron (ZOFRAN) IV  Assessment/Plan: ESRD  -Patient was sent from his nephrologist office  to initiate hemodialysis.  -After discussion with his nephrologist, decision was made to start peritoneal dialysis using his PD catheter.  -General surgery exteriorized the catheter yesterday.  -Peritoneal dialysis started last night, the catheter wasn't functioning well without a good return. Pt is sig. Constipated.    -Patient had left brachiocephalic AV fistula placed by Dr. Darrick Penna today, this being scheduled earlier, it was supposed to be outpatient procedure.  -Awaits nephrology recommendation regarding hemodialysis versus peritoneal dialysis.   Leukocytosis  -With marked neutrophilia, but this seems chronic.  -No signs of infections. And apparently resolved now, without intervention.   Diabetes mellitus type 1  -Restarted back on his insulin. Hemoglobin A1c is 5.8.  -significant hypoglycemia this morning, will cut back on his insulin doses, d/c evening supplemental coverage, start carb modified diet  Hypertension  -Controlled, continue preadmission medications   LOS: 5 days   Mark Miranda 01/11/2012, 1:11 PM  Cleora Fleet, MD, CDE, FAAFP [facility] [place], [place]  [phone]

## 2012-01-11 NOTE — Progress Notes (Addendum)
1 Day Post-Op  Subjective: Patient feels okay today, mild discomfort from recent shunt placement in his left arm.  Objective: Vital signs in last 24 hours: Temp:  [97.6 F (36.4 C)-98.8 F (37.1 C)] 97.6 F (36.4 C) (06/05 0517) Pulse Rate:  [76-90] 77  (06/05 0517) Resp:  [14-22] 18  (06/05 0517) BP: (137-165)/(78-96) 137/78 mmHg (06/05 0517) SpO2:  [95 %-100 %] 96 % (06/05 0517) Weight:  [140 lb 14 oz (63.9 kg)] 140 lb 14 oz (63.9 kg) (06/04 2201) Last BM Date: 01/08/12  Intake/Output from previous day: 06/04 0701 - 06/05 0700 In: 1375.7 [P.O.:390; I.V.:985.7] Out: -  Intake/Output this shift:    Incision/Wound:peritoneal catheter site appears C/D?I, no erythema or drainage.  At this time it is not being utilized, secondary to return flow issues. New shunt placement in left arm, incision appears well approximated, mild erythema noted around border, no exudate. Tender to touch;as it was just placed yesterday.  Lab Results:   [facility] 01/09/12 1233  WBC 10.5  HGB 10.4*  HCT 29.7*  PLT 210   BMET  Basename 01/11/12 0605 01/10/12 2230  NA 141 133*  K 4.1 4.5  CL 102 98  CO2 23 21  GLUCOSE 49* 296*  BUN 120* 119*  CREATININE 11.40* 11.38*  CALCIUM 8.0* 7.8*   PT/INR No results found for this basename: LABPROT:2,INR:2 in the last 72 hours ABG No results found for this basename: PHART:2,PCO2:2,PO2:2,HCO3:2 in the last 72 hours  Studies/Results: Dg Abd 1 View  01/10/2012  *RADIOLOGY REPORT*  Clinical Data: Assess position of peritoneal dialysis catheter  ABDOMEN - 1 VIEW  Comparison: None.  Findings: Peritoneal dialysis catheter is present in the right lower quadrant and it is coiled in the right side of the pelvis. No disproportionate dilatation of bowel.  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  No obvious free intraperitoneal gas.  IMPRESSION: Peritoneal dialysis catheter.  Nonobstructive bowel gas pattern.  Original Report Authenticated By:  Donavan Burnet, M.D.    Anti-infectives: Anti-infectives     Start     Dose/Rate Route Frequency Ordered Stop   01/08/12 1936   ceFAZolin (ANCEF) IVPB 1 g/50 mL premix  Status:  Discontinued        1 g 100 mL/hr over 30 Minutes Intravenous 60 min pre-op 01/08/12 1936 01/09/12 1037   01/07/12 0800   ceFAZolin (ANCEF) IVPB 1 g/50 mL premix     Comments: GIVE ON CALL TO IR 6/1 FOR DIALYSIS CATHETER PLACEMENT      1 g 100 mL/hr over 30 Minutes Intravenous On call 01/06/12 1706 01/07/12 1201   01/06/12 2200   ceFAZolin (ANCEF) IVPB 1 g/50 mL premix  Status:  Discontinued        1 g 100 mL/hr over 30 Minutes Intravenous  Once 01/06/12 2004 01/08/12 1939          Assessment/Plan: s/p Procedure(s) (LRB): ARTERIOVENOUS (AV) FISTULA CREATION (Left) 1. ESRD 2. Status Post AV fistula placement left arm  Plan:  1. Defer management of new AV fistula to nephrology. Dialysis as per their recommendation. 2. Continue to hold on utilizing peritoneal catheter for now. If it is to be utilized; then recommend frequent flushing of catheter to possibly facilitate better outflow return.  LOS: 5 days    Blenda Mounts 01/11/2012   Surgery Attending: Patient is interviewed and examined this afternoon. Agree with notes above.  He is stable and has no significant complaints. His abdomen is soft flat and  nontender. The abdominal wounds are healing uneventfully.  I discussed his care with Dr. Briant Cedar. If the medication reimbursement issue all I also peritoneal dialysis, and the catheter will need to be manipulated and revised under general anesthesia. If, however, his medications require him to have hemodialysis than the PD catheter will simply need to be removed.  We will follow.   Angelia Mould. Derrell Lolling, M.D., [facility] General and Minimally invasive Surgery Breast and Colorectal Surgery Office:   [phone] Pager:   [phone]

## 2012-01-11 NOTE — Progress Notes (Signed)
Pt refused for me to try and flush his PD cath. He stated that he would like to wait until tomorrow (01/11/12).

## 2012-01-11 NOTE — Progress Notes (Signed)
01/11/2012 4:10 PM  Attempt made at flushing patient's PD cath after patient had BM. Could not get any PD fluid into patient, despite BM and repositioning.  Catheter appears intact externally with good conversion set to actually PD catheter.  Dr. Briant Cedar notified.  PD continues to be on hold, Dr. Briant Cedar to see patient again tomorrow regarding HD options and PC placement.  Pt informed of this information, to which he verbalized understanding.  Will continue to monitor.  Eunice Blase

## 2012-01-11 NOTE — Progress Notes (Signed)
CBG 438. Dr Arthor Captain notified. New orders received. Pt remains stable. Will continue to monitor. [place], Rosanna Randy

## 2012-01-12 DIAGNOSIS — E1029 Type 1 diabetes mellitus with other diabetic kidney complication: Secondary | ICD-10-CM

## 2012-01-12 DIAGNOSIS — E1069 Type 1 diabetes mellitus with other specified complication: Secondary | ICD-10-CM

## 2012-01-12 DIAGNOSIS — I10 Essential (primary) hypertension: Secondary | ICD-10-CM

## 2012-01-12 DIAGNOSIS — N186 End stage renal disease: Secondary | ICD-10-CM

## 2012-01-12 LAB — HEPATITIS B SURFACE ANTIGEN: Hepatitis B Surface Ag: NEGATIVE

## 2012-01-12 LAB — BASIC METABOLIC PANEL
BUN: 117 mg/dL — ABNORMAL HIGH (ref 6–23)
CO2: 24 mEq/L (ref 19–32)
GFR calc non Af Amer: 5 mL/min — ABNORMAL LOW (ref >90)
Glucose, Bld: 187 mg/dL — ABNORMAL HIGH (ref 70–99)
Potassium: 4.5 mEq/L (ref 3.5–5.1)

## 2012-01-12 LAB — GLUCOSE, CAPILLARY
Glucose-Capillary: 158 mg/dL — ABNORMAL HIGH (ref 70–99)
Glucose-Capillary: 119 mg/dL — ABNORMAL HIGH (ref 70–99)
Glucose-Capillary: 166 mg/dL — ABNORMAL HIGH (ref 70–99)

## 2012-01-12 MED ORDER — NICOTINE 14 MG/24HR TD PT24
14.0000 mg | MEDICATED_PATCH | Freq: Every day | TRANSDERMAL | Status: DC
  Administered 2012-01-12 – 2012-01-14 (×3): 14 mg via TRANSDERMAL
  Filled 2012-01-12 (×3): qty 1

## 2012-01-12 MED ORDER — DEXTROSE 5 % IV SOLN
1.5000 g | INTRAVENOUS | Status: AC
  Filled 2012-01-12 (×3): qty 1.5

## 2012-01-12 NOTE — Progress Notes (Signed)
01/12/2012 3:05 PM Hemodialysis outpatient note; the CLIP process has begun on this patient for the [facility] unit. The unit will take patient on a TTS 2nd shift schedule. They can begin treatment on Tues 06.11.13 at 930 AM ONCE we get official notification from [facility] that the patient has been approved financially. I anticipate an answer by Friday 06.07.13 AM. Thank you. Tilman Neat

## 2012-01-12 NOTE — Progress Notes (Signed)
S: Feels well.  Good appetite.  No overt uremic sxs O:BP 149/79  Pulse 76  Temp(Src) 97.6 F (36.4 C) (Oral)  Resp 18  Ht 5\' 8"  (1.727 m)  Wt 62.5 kg (137 lb 12.6 oz)  BMI 20.95 kg/m2  SpO2 98%  Intake/Output Summary (Last 24 hours) at 01/12/12 1002 Last data filed at 01/12/12 0856  Gross per 24 hour  Intake    840 ml  Output      0 ml  Net    840 ml   Weight change: -1.4 kg (-3 lb 1.4 oz) JXB:JYNWG and alert mild  CVS:RRR with 2-3/6 systolic murmur Resp:clear Abd:+BS NTND  PD cath exteriorized Ext:no edema  Lt UA AVF + bruit NEURO: CNI  Ox3  Dg Abd 1 View  01/10/2012  *RADIOLOGY REPORT*  Clinical Data: Assess position of peritoneal dialysis catheter  ABDOMEN - 1 VIEW  Comparison: None.  Findings: Peritoneal dialysis catheter is present in the right lower quadrant and it is coiled in the right side of the pelvis. No disproportionate dilatation of bowel.  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  No obvious free intraperitoneal gas.  IMPRESSION: Peritoneal dialysis catheter.  Nonobstructive bowel gas pattern.  Original Report Authenticated By: Donavan Burnet, M.D.   BMET    Component Value Date/Time   NA 138 01/12/2012 0625   K 4.5 01/12/2012 0625   CL 99 01/12/2012 0625   CO2 24 01/12/2012 0625   GLUCOSE 187* 01/12/2012 0625   BUN 117* 01/12/2012 0625   CREATININE 11.33* 01/12/2012 0625   CALCIUM 8.1* 01/12/2012 0625   CALCIUM 8.2* 09/17/2010 2201   GFRNONAA 5* 01/12/2012 0625   GFRAA 6* 01/12/2012 0625   CBC    Component Value Date/Time   WBC 10.5 01/09/2012 1233   RBC 3.48* 01/09/2012 1233   HGB 10.4* 01/09/2012 1233   HCT 29.7* 01/09/2012 1233   PLT 210 01/09/2012 1233   MCV 85.3 01/09/2012 1233   MCH 29.9 01/09/2012 1233   MCHC 35.0 01/09/2012 1233   RDW 13.9 01/09/2012 1233   LYMPHSABS 1.2 01/06/2012 1433   MONOABS 0.8 01/06/2012 1433   EOSABS 0.2 01/06/2012 1433   BASOSABS 0.0 01/06/2012 1433     Assessment: 1. ESRD PD cath not functioning 2. HTN 3. DM 4. Anemia on  aranesp Plan: 1.Plan perm cath in Am and will start HD 2. Clip for [facility] 3. Will manipulation of PD cath as outpt or new cath as out pt Mark Miranda T

## 2012-01-12 NOTE — Progress Notes (Signed)
[facility] Progress Note  Mark Miranda is a 40 y.o. male who had a Left Brachial Cephalic AV fistula placed by Dr Fields on 01/10/12. He also had a PD catheter which is not functioning.  Dr. Mattingly requests placement of IJ Diatek to start HD  Dr. Early to place catheter tomorrow. Orders written.  Dayana Dalporto J, PA-C 11:08 AM 01/12/2012    

## 2012-01-12 NOTE — Progress Notes (Signed)
Left arm incision healing +thrill Some forearm numbness No steal  No hematoma  Will schedule follow up in 1 month  Fabienne Bruns, MD [facility] Office: [phone] Pager: [phone]

## 2012-01-12 NOTE — Progress Notes (Signed)
[facility] Progress Note  01/12/2012   Subjective: Pt having a difficult time with renal diet.  No further hypoglycemia.  No other complaints.    Objective:  Vital signs in last 24 hours: Filed Vitals:   01/11/12 1755 01/11/12 2104 01/12/12 0512 01/12/12 0958  BP: 158/84 152/89 144/71 149/79  Pulse: 78 90 80 76  Temp: 98.5 F (36.9 C) 98.1 F (36.7 C) 98.1 F (36.7 C) 97.6 F (36.4 C)  TempSrc: Oral Oral Oral Oral  Resp: 18 20 18 18   Height:      Weight:  62.5 kg (137 lb 12.6 oz)    SpO2: 95% 96% 97% 98%   Weight change: -1.4 kg (-3 lb 1.4 oz)  Intake/Output Summary (Last 24 hours) at 01/12/12 1058 Last data filed at 01/12/12 0856  Gross per 24 hour  Intake    840 ml  Output      0 ml  Net    840 ml   Lab Results  Component Value Date   HGBA1C 5.8* 01/06/2012   HGBA1C 6.6* 02/18/2010   Lab Results  Component Value Date   MICROALBUR 207.00* 04/15/2008   LDLCALC 107* 09/17/2010   CREATININE 11.33* 01/12/2012    Review of Systems As above, otherwise all reviewed and reported negative  Physical Exam General: Alert and awake oriented x3 not in any acute distress.  HEENT: anicteric sclera, pupils equal reactive to light and accommodation  CVS: S1-S2 heard, 2/6 systolic murmur  Chest: clear to auscultation bilaterally, no wheezing rales or rhonchi  Abdomen: port in place, wound clean and dry, normal bowel sounds, soft, nontender, nondistended, no organomegaly  Neuro: Cranial nerves II-XII intact, no focal neurological deficits  Extremities: no cyanosis, no clubbing or edema noted bilaterally  Lab Results: Results for orders placed during the hospital encounter of 01/06/12 (from the past 24 hour(s))  GLUCOSE, CAPILLARY     Status: Abnormal   Collection Time   01/11/12 12:00 PM      Component Value Range   Glucose-Capillary 157 (*) 70 - 99 (mg/dL)   Comment 1 Documented in Chart     Comment 2 Notify RN    GLUCOSE, CAPILLARY     Status: Abnormal   Collection  Time   01/11/12  4:49 PM      Component Value Range   Glucose-Capillary 223 (*) 70 - 99 (mg/dL)   Comment 1 Documented in Chart     Comment 2 Notify RN    GLUCOSE, CAPILLARY     Status: Abnormal   Collection Time   01/11/12  9:03 PM      Component Value Range   Glucose-Capillary 105 (*) 70 - 99 (mg/dL)  BASIC METABOLIC PANEL     Status: Abnormal   Collection Time   01/12/12  6:25 AM      Component Value Range   Sodium 138  135 - 145 (mEq/L)   Potassium 4.5  3.5 - 5.1 (mEq/L)   Chloride 99  96 - 112 (mEq/L)   CO2 24  19 - 32 (mEq/L)   Glucose, Bld 187 (*) 70 - 99 (mg/dL)   BUN 308 (*) 6 - 23 (mg/dL)   Creatinine, Ser 65.78 (*) 0.50 - 1.35 (mg/dL)   Calcium 8.1 (*) 8.4 - 10.5 (mg/dL)   GFR calc non Af Amer 5 (*) >90 (mL/min)   GFR calc Af Amer 6 (*) >90 (mL/min)  GLUCOSE, CAPILLARY     Status: Abnormal   Collection Time   01/12/12  7:59 AM      Component Value Range   Glucose-Capillary 158 (*) 70 - 99 (mg/dL)    Micro Results: Recent Results (from the past 240 hour(s))  SURGICAL PCR SCREEN     Status: Normal   Collection Time   01/06/12  1:44 PM      Component Value Range Status Comment   MRSA, PCR NEGATIVE  NEGATIVE  Final    Staphylococcus aureus NEGATIVE  NEGATIVE  Final     Medications:  Scheduled Meds:   . amLODipine  10 mg Oral QHS  . atorvastatin  20 mg Oral q1800  . calcium carbonate  1 tablet Oral TID  . darbepoetin (ARANESP) injection - DIALYSIS  40 mcg Subcutaneous Q Mon-HD  . dialysis solution for CAPD/CCPD [facility])   Peritoneal Dialysis Once  . heparin  5,000 Units Subcutaneous Q8H  . insulin aspart  0-9 Units Subcutaneous TID WC  . insulin aspart protamine-insulin aspart  10 Units Subcutaneous Q supper  . insulin aspart protamine-insulin aspart  20 Units Subcutaneous Q breakfast  . multivitamin  1 tablet Oral Daily  . sodium bicarbonate  1,300 mg Oral BID  . DISCONTD: insulin aspart  0-5 Units Subcutaneous QHS  . DISCONTD: insulin aspart protamine-insulin  aspart  15 Units Subcutaneous Q supper  . DISCONTD: insulin aspart protamine-insulin aspart  30 Units Subcutaneous Q breakfast   Continuous Infusions:   . sodium chloride 20 mL/hr (01/10/12 0953)   PRN Meds:.acetaminophen, acetaminophen, HYDROcodone-acetaminophen, HYDROmorphone (DILAUDID) injection, ondansetron (ZOFRAN) IV, ondansetron (ZOFRAN) IV, ondansetron, ondansetron, oxyCODONE-acetaminophen, sorbitol  Assessment/Plan: ESRD  -Patient was sent from his nephrologist office to initiate hemodialysis.  -After discussion with his nephrologist, decision was made to start peritoneal dialysis using his PD catheter.  -General surgery exteriorized the catheter yesterday.  -Peritoneal dialysis started last night, the catheter wasn't functioning well without a good return. Pt is sig. Constipated.  -Patient had left brachiocephalic AV fistula placed by Dr. Darrick Penna today, this being scheduled earlier, it was supposed to be outpatient procedure.  -Pt to get permcath in AM and start hemodialysis  Leukocytosis  -With marked neutrophilia, but this seems chronic.  -No signs of infections. And apparently resolved now, without intervention.   Hypoglycemia -cut way back on insulin doses and no recurrence noted  Diabetes mellitus type 1  -Restarted back on his insulin. Hemoglobin A1c is 5.8.  -significant hypoglycemia this morning, will cut back on his insulin doses, d/c evening supplemental coverage, start carb modified diet   Hypertension  -Controlled, continue preadmission medications   LOS: 6 days   Mark Miranda 01/12/2012, 10:58 AM   Cleora Fleet, MD, CDE, FAAFP [facility] [place], [place]  [phone]

## 2012-01-13 ENCOUNTER — Inpatient Hospital Stay (HOSPITAL_COMMUNITY): Payer: Medicaid Other

## 2012-01-13 ENCOUNTER — Encounter (HOSPITAL_COMMUNITY)
Admission: EM | Disposition: A | Discharge status: Home / Self Care | Payer: Self-pay | Source: Home / Self Care | Attending: Internal Medicine

## 2012-01-13 ENCOUNTER — Encounter (HOSPITAL_COMMUNITY): Payer: Self-pay | Admitting: *Deleted

## 2012-01-13 ENCOUNTER — Inpatient Hospital Stay (HOSPITAL_COMMUNITY): Payer: Medicaid Other | Admitting: *Deleted

## 2012-01-13 DIAGNOSIS — E1069 Type 1 diabetes mellitus with other specified complication: Secondary | ICD-10-CM

## 2012-01-13 DIAGNOSIS — N186 End stage renal disease: Secondary | ICD-10-CM

## 2012-01-13 DIAGNOSIS — I10 Essential (primary) hypertension: Secondary | ICD-10-CM

## 2012-01-13 DIAGNOSIS — E1029 Type 1 diabetes mellitus with other diabetic kidney complication: Secondary | ICD-10-CM

## 2012-01-13 HISTORY — PX: INSERTION OF DIALYSIS CATHETER: SHX1324

## 2012-01-13 LAB — BASIC METABOLIC PANEL
BUN: 120 mg/dL — ABNORMAL HIGH (ref 6–23)
Calcium: 8.2 mg/dL — ABNORMAL LOW (ref 8.4–10.5)
Creatinine, Ser: 11.49 mg/dL — ABNORMAL HIGH (ref 0.50–1.35)
GFR calc Af Amer: 6 mL/min — ABNORMAL LOW (ref >90)
GFR calc non Af Amer: 5 mL/min — ABNORMAL LOW (ref >90)
Glucose, Bld: 293 mg/dL — ABNORMAL HIGH (ref 70–99)
Potassium: 4.9 mEq/L (ref 3.5–5.1)

## 2012-01-13 LAB — CBC
MCH: 29.8 pg (ref 26.0–34.0)
MCHC: 34.5 g/dL (ref 30.0–36.0)
RDW: 13.7 % (ref 11.5–15.5)

## 2012-01-13 LAB — GLUCOSE, CAPILLARY
Glucose-Capillary: 212 mg/dL — ABNORMAL HIGH (ref 70–99)
Glucose-Capillary: 314 mg/dL — ABNORMAL HIGH (ref 70–99)
Glucose-Capillary: 366 mg/dL — ABNORMAL HIGH (ref 70–99)

## 2012-01-13 SURGERY — INSERTION OF DIALYSIS CATHETER
Anesthesia: Monitor Anesthesia Care | Laterality: Right | Wound class: Clean

## 2012-01-13 MED ORDER — LIDOCAINE-EPINEPHRINE 0.5-1:200000 % IJ SOLN
INTRAMUSCULAR | Status: DC | PRN
  Administered 2012-01-13: 5 mL

## 2012-01-13 MED ORDER — FENTANYL CITRATE 0.05 MG/ML IJ SOLN
INTRAMUSCULAR | Status: DC | PRN
  Administered 2012-01-13: 25 ug via INTRAVENOUS
  Administered 2012-01-13: 100 ug via INTRAVENOUS
  Administered 2012-01-13: 25 ug via INTRAVENOUS

## 2012-01-13 MED ORDER — SODIUM CHLORIDE 0.9 % IV SOLN
INTRAVENOUS | Status: DC | PRN
  Administered 2012-01-13: 10:00:00 via INTRAVENOUS

## 2012-01-13 MED ORDER — HEPARIN SODIUM (PORCINE) 1000 UNIT/ML IJ SOLN
INTRAMUSCULAR | Status: DC | PRN
  Administered 2012-01-13: 4.6 mL via INTRAVENOUS

## 2012-01-13 MED ORDER — CEFAZOLIN SODIUM 1-5 GM-% IV SOLN
INTRAVENOUS | Status: AC
  Filled 2012-01-13: qty 50

## 2012-01-13 MED ORDER — MIDAZOLAM HCL 5 MG/5ML IJ SOLN
INTRAMUSCULAR | Status: DC | PRN
  Administered 2012-01-13 (×2): 1 mg via INTRAVENOUS

## 2012-01-13 MED ORDER — HYDROMORPHONE HCL PF 1 MG/ML IJ SOLN
0.2500 mg | INTRAMUSCULAR | Status: DC | PRN
  Administered 2012-01-13 (×2): 0.5 mg via INTRAVENOUS

## 2012-01-13 MED ORDER — CEFAZOLIN SODIUM 1-5 GM-% IV SOLN
INTRAVENOUS | Status: DC | PRN
  Administered 2012-01-13: 1 g via INTRAVENOUS

## 2012-01-13 MED ORDER — LIDOCAINE HCL (CARDIAC) 20 MG/ML IV SOLN
INTRAVENOUS | Status: DC | PRN
  Administered 2012-01-13: 100 mg via INTRAVENOUS

## 2012-01-13 MED ORDER — SODIUM CHLORIDE 0.9 % IR SOLN
Status: DC | PRN
  Administered 2012-01-13: 1000 mL

## 2012-01-13 MED ORDER — SODIUM CHLORIDE 0.9 % IR SOLN
Status: DC | PRN
  Administered 2012-01-13: 10:00:00

## 2012-01-13 MED ORDER — PROPOFOL 10 MG/ML IV EMUL
INTRAVENOUS | Status: DC | PRN
  Administered 2012-01-13: 140 mg via INTRAVENOUS

## 2012-01-13 SURGICAL SUPPLY — 44 items
BAG DECANTER FOR FLEXI CONT (MISCELLANEOUS) ×2 IMPLANT
CATH CANNON HEMO 15F 50CM (CATHETERS) IMPLANT
CATH CANNON HEMO 15FR 19 (HEMODIALYSIS SUPPLIES) IMPLANT
CATH CANNON HEMO 15FR 23CM (HEMODIALYSIS SUPPLIES) ×2 IMPLANT
CATH CANNON HEMO 15FR 31CM (HEMODIALYSIS SUPPLIES) IMPLANT
CATH CANNON HEMO 15FR 32CM (HEMODIALYSIS SUPPLIES) IMPLANT
CLIP LIGATING EXTRA MED SLVR (CLIP) ×2 IMPLANT
CLIP LIGATING EXTRA SM BLUE (MISCELLANEOUS) ×2 IMPLANT
CLOTH BEACON ORANGE TIMEOUT ST (SAFETY) ×2 IMPLANT
COVER PROBE W GEL 5X96 (DRAPES) IMPLANT
COVER SURGICAL LIGHT HANDLE (MISCELLANEOUS) ×2 IMPLANT
DECANTER SPIKE VIAL GLASS SM (MISCELLANEOUS) ×2 IMPLANT
DRAPE C-ARM 42X72 X-RAY (DRAPES) ×2 IMPLANT
DRAPE CHEST BREAST 15X10 FENES (DRAPES) ×2 IMPLANT
GAUZE SPONGE 2X2 8PLY STRL LF (GAUZE/BANDAGES/DRESSINGS) ×1 IMPLANT
GAUZE SPONGE 4X4 16PLY XRAY LF (GAUZE/BANDAGES/DRESSINGS) ×2 IMPLANT
GLOVE BIOGEL PI IND STRL 6.5 (GLOVE) ×2 IMPLANT
GLOVE BIOGEL PI IND STRL 7.0 (GLOVE) ×1 IMPLANT
GLOVE BIOGEL PI INDICATOR 6.5 (GLOVE) ×2
GLOVE BIOGEL PI INDICATOR 7.0 (GLOVE) ×1
GLOVE SS BIOGEL STRL SZ 7.5 (GLOVE) ×1 IMPLANT
GLOVE SUPERSENSE BIOGEL SZ 7.5 (GLOVE) ×1
GOWN STRL NON-REIN LRG LVL3 (GOWN DISPOSABLE) ×6 IMPLANT
KIT BASIN OR (CUSTOM PROCEDURE TRAY) ×2 IMPLANT
KIT ROOM TURNOVER OR (KITS) ×2 IMPLANT
NEEDLE 18GX1X1/2 (RX/OR ONLY) (NEEDLE) ×2 IMPLANT
NEEDLE 22X1 1/2 (OR ONLY) (NEEDLE) ×2 IMPLANT
NEEDLE HYPO 25GX1X1/2 BEV (NEEDLE) ×2 IMPLANT
NS IRRIG 1000ML POUR BTL (IV SOLUTION) ×2 IMPLANT
PACK SURGICAL SETUP 50X90 (CUSTOM PROCEDURE TRAY) ×2 IMPLANT
PAD ARMBOARD 7.5X6 YLW CONV (MISCELLANEOUS) ×4 IMPLANT
SOAP 2 % CHG 4 OZ (WOUND CARE) ×2 IMPLANT
SPONGE GAUZE 2X2 STER 10/PKG (GAUZE/BANDAGES/DRESSINGS) ×1
SUT ETHILON 3 0 PS 1 (SUTURE) ×2 IMPLANT
SUT VICRYL 4-0 PS2 18IN ABS (SUTURE) ×2 IMPLANT
SYR 20CC LL (SYRINGE) ×2 IMPLANT
SYR 30ML LL (SYRINGE) IMPLANT
SYR 5ML LL (SYRINGE) ×4 IMPLANT
SYR CONTROL 10ML LL (SYRINGE) ×2 IMPLANT
SYRINGE 10CC LL (SYRINGE) ×2 IMPLANT
TAPE CLOTH SURG 4X10 WHT LF (GAUZE/BANDAGES/DRESSINGS) ×2 IMPLANT
TOWEL OR 17X24 6PK STRL BLUE (TOWEL DISPOSABLE) ×2 IMPLANT
TOWEL OR 17X26 10 PK STRL BLUE (TOWEL DISPOSABLE) ×2 IMPLANT
WATER STERILE IRR 1000ML POUR (IV SOLUTION) ×2 IMPLANT

## 2012-01-13 NOTE — Progress Notes (Signed)
[facility] Progress Note  01/13/2012  Subjective: Pt without complaints today.    Objective:  Vital signs in last 24 hours: Filed Vitals:   01/13/12 1110 01/13/12 1125 01/13/12 1130 01/13/12 1140  BP: 160/87 150/79  150/80  Pulse: 79 74 79 76  Temp:      TempSrc:      Resp: 12 12 11 11   Height:      Weight:      SpO2: 98% 96% 97% 96%   Weight change: -0.5 kg (-1 lb 1.6 oz)  Intake/Output Summary (Last 24 hours) at 01/13/12 1154 Last data filed at 01/13/12 1039  Gross per 24 hour  Intake    290 ml  Output      0 ml  Net    290 ml   Lab Results  Component Value Date   HGBA1C 5.8* 01/06/2012   HGBA1C 6.6* 02/18/2010   Lab Results  Component Value Date   MICROALBUR 207.00* 04/15/2008   LDLCALC 107* 09/17/2010   CREATININE 11.49* 01/13/2012    Review of Systems As above, otherwise all reviewed and reported negative  Physical Exam General: Alert and awake oriented x3 not in any acute distress.  HEENT: anicteric sclera, pupils equal reactive to light and accommodation  CVS: S1-S2 heard, 2/6 systolic murmur  Chest: clear to auscultation bilaterally, no wheezing rales or rhonchi  Abdomen: port in place, wound clean and dry, normal bowel sounds, soft, nontender, nondistended, no organomegaly  Neuro: Cranial nerves II-XII intact, no focal neurological deficits  Extremities: no cyanosis  Lab Results: Results for orders placed during the hospital encounter of 01/06/12 (from the past 24 hour(s))  GLUCOSE, CAPILLARY     Status: Abnormal   Collection Time   01/12/12 11:57 AM      Component Value Range   Glucose-Capillary 119 (*) 70 - 99 (mg/dL)  GLUCOSE, CAPILLARY     Status: Abnormal   Collection Time   01/12/12  4:43 PM      Component Value Range   Glucose-Capillary 277 (*) 70 - 99 (mg/dL)  GLUCOSE, CAPILLARY     Status: Abnormal   Collection Time   01/12/12  8:45 PM      Component Value Range   Glucose-Capillary 166 (*) 70 - 99 (mg/dL)  BASIC METABOLIC PANEL      Status: Abnormal   Collection Time   01/13/12  6:20 AM      Component Value Range   Sodium 137  135 - 145 (mEq/L)   Potassium 4.9  3.5 - 5.1 (mEq/L)   Chloride 98  96 - 112 (mEq/L)   CO2 23  19 - 32 (mEq/L)   Glucose, Bld 293 (*) 70 - 99 (mg/dL)   BUN 454 (*) 6 - 23 (mg/dL)   Creatinine, Ser 09.81 (*) 0.50 - 1.35 (mg/dL)   Calcium 8.2 (*) 8.4 - 10.5 (mg/dL)   GFR calc non Af Amer 5 (*) >90 (mL/min)   GFR calc Af Amer 6 (*) >90 (mL/min)  CBC     Status: Abnormal   Collection Time   01/13/12  6:20 AM      Component Value Range   WBC 7.9  4.0 - 10.5 (K/uL)   RBC 3.26 (*) 4.22 - 5.81 (MIL/uL)   Hemoglobin 9.7 (*) 13.0 - 17.0 (g/dL)   HCT 19.1 (*) 47.8 - 52.0 (%)   MCV 86.2  78.0 - 100.0 (fL)   MCH 29.8  26.0 - 34.0 (pg)   MCHC 34.5  30.0 -  36.0 (g/dL)   RDW 16.1  09.6 - 04.5 (%)   Platelets 201  150 - 400 (K/uL)  GLUCOSE, CAPILLARY     Status: Abnormal   Collection Time   01/13/12  7:39 AM      Component Value Range   Glucose-Capillary 314 (*) 70 - 99 (mg/dL)  GLUCOSE, CAPILLARY     Status: Abnormal   Collection Time   01/13/12 11:07 AM      Component Value Range   Glucose-Capillary 212 (*) 70 - 99 (mg/dL)    Micro Results: Recent Results (from the past 240 hour(s))  SURGICAL PCR SCREEN     Status: Normal   Collection Time   01/06/12  1:44 PM      Component Value Range Status Comment   MRSA, PCR NEGATIVE  NEGATIVE  Final    Staphylococcus aureus NEGATIVE  NEGATIVE  Final     Medications:  Scheduled Meds:   . amLODipine  10 mg Oral QHS  . atorvastatin  20 mg Oral q1800  . calcium carbonate  1 tablet Oral TID  . cefUROXime (ZINACEF)  IV  1.5 g Intravenous On Call to OR  . darbepoetin (ARANESP) injection - DIALYSIS  40 mcg Subcutaneous Q Mon-HD  . dialysis solution for CAPD/CCPD [facility])   Peritoneal Dialysis Once  . heparin  5,000 Units Subcutaneous Q8H  . insulin aspart  0-9 Units Subcutaneous TID WC  . insulin aspart protamine-insulin aspart  10 Units Subcutaneous Q  supper  . insulin aspart protamine-insulin aspart  20 Units Subcutaneous Q breakfast  . multivitamin  1 tablet Oral Daily  . nicotine  14 mg Transdermal Daily  . sodium bicarbonate  1,300 mg Oral BID   Continuous Infusions:   . sodium chloride 20 mL/hr at 01/13/12 0936   PRN Meds:.acetaminophen, acetaminophen, HYDROcodone-acetaminophen, HYDROmorphone (DILAUDID) injection, HYDROmorphone (DILAUDID) injection, ondansetron (ZOFRAN) IV, ondansetron (ZOFRAN) IV, ondansetron, ondansetron, oxyCODONE-acetaminophen, sorbitol, DISCONTD: heparin 6000 unit irrigation, DISCONTD: heparin, DISCONTD: lidocaine-EPINEPHrine, DISCONTD: sodium chloride irrigation  Assessment/Plan:  ESRD  -Patient was sent from his nephrologist office to initiate hemodialysis.  -After discussion with his nephrologist, decision was made to start peritoneal dialysis using his PD catheter.  -General surgery exteriorized the catheter yesterday.  -Peritoneal dialysis started last night, the catheter wasn't functioning well without a good return. Pt is sig constipated.  -Patient had left brachiocephalic AV fistula placed by Dr. Darrick Penna today, this being scheduled earlier, it was supposed to be outpatient procedure.  -Pt to get permcath today and start hemodialysis   Leukocytosis  -With marked neutrophilia, but this seems chronic.  -No signs of infections. And apparently resolved now, without intervention.   Hypoglycemia  -cut way back on insulin doses and no recurrence noted   Diabetes mellitus type 1  -Restarted back on his insulin. Hemoglobin A1c is 5.8.  -significant hypoglycemia this morning, will cut back on his insulin doses, d/c evening supplemental coverage, start carb modified diet   Hypertension  -Controlled, continue preadmission medications   LOS: 7 days   Jairon Ripberger 01/13/2012, 11:54 AM  Cleora Fleet, MD, CDE, FAAFP [facility] [place], [place]  [phone]

## 2012-01-13 NOTE — Transfer of Care (Signed)
Immediate Anesthesia Transfer of Care Note  Patient: Mark Miranda  Procedure(s) Performed: Procedure(s) (LRB): INSERTION OF DIALYSIS CATHETER (Right)  Patient Location: PACU  Anesthesia Type: General  Level of Consciousness: sedated  Airway & Oxygen Therapy: Patient Spontanous Breathing and Patient connected to nasal cannula oxygen  Post-op Assessment: Report given to PACU RN and Post -op Vital signs reviewed and stable  Post vital signs: Reviewed  Complications: No apparent anesthesia complications

## 2012-01-13 NOTE — Progress Notes (Signed)
Patient ID: Mark Miranda, male   DOB: Feb 16, 1971, 41 y.o.   MRN: [id number] 3 Days Post-Op  Subjective: Patient wo complaints, reports going for HD cath today.  Still wants to pursue peritoneal dialysis long term.  Denies abd pain.  Objective: Vital signs in last 24 hours: Temp:  [97.6 F (36.4 C)-98.3 F (36.8 C)] 97.7 F (36.5 C) (06/07 0518) Pulse Rate:  [73-84] 73  (06/07 0518) Resp:  [18-20] 18  (06/07 0518) BP: (139-165)/(66-80) 139/66 mmHg (06/07 0518) SpO2:  [96 %-99 %] 98 % (06/07 0518) Weight:  [136 lb 11 oz (62 kg)] 136 lb 11 oz (62 kg) (06/06 2041) Last BM Date: 01/11/12  Intake/Output from previous day: 06/06 0701 - 06/07 0700 In: 480 [P.O.:480] Out: -  Intake/Output this shift:    PE: Abd: soft, nontender, +bs, PD cath in place, incision c/d/i with 3 vertical mattress sutures in place  Lab Results:   [facility] 01/13/12 0620  WBC 7.9  HGB 9.7*  HCT 28.1*  PLT 201   BMET  Basename 01/13/12 0620 01/12/12 0625  NA 137 138  K 4.9 4.5  CL 98 99  CO2 23 24  GLUCOSE 293* 187*  BUN PENDING 117*  CREATININE 11.49* 11.33*  CALCIUM 8.2* 8.1*   PT/INR No results found for this basename: LABPROT:2,INR:2 in the last 72 hours CMP     Component Value Date/Time   NA 137 01/13/2012 0620   K 4.9 01/13/2012 0620   CL 98 01/13/2012 0620   CO2 23 01/13/2012 0620   GLUCOSE 293* 01/13/2012 0620   BUN PENDING 01/13/2012 0620   CREATININE 11.49* 01/13/2012 0620   CALCIUM 8.2* 01/13/2012 0620   CALCIUM 8.2* 09/17/2010 2201   PROT 6.7 01/07/2012 0550   ALBUMIN 3.4* 01/11/2012 0605   AST 7 01/07/2012 0550   ALT 6 01/07/2012 0550   ALKPHOS 118* 01/07/2012 0550   BILITOT 0.2* 01/07/2012 0550   GFRNONAA 5* 01/13/2012 0620   GFRAA 6* 01/13/2012 0620   Lipase  No results found for this basename: lipase       Studies/Results: No results found.  Anti-infectives: Anti-infectives     Start     Dose/Rate Route Frequency Ordered Stop   01/13/12 0600   cefUROXime (ZINACEF) 1.5 g in dextrose 5  % 50 mL IVPB        1.5 g 100 mL/hr over 30 Minutes Intravenous On call to O.R. 01/12/12 1108 01/14/12 0559   01/08/12 1936   ceFAZolin (ANCEF) IVPB 1 g/50 mL premix  Status:  Discontinued        1 g 100 mL/hr over 30 Minutes Intravenous 60 min pre-op 01/08/12 1936 01/09/12 1037   01/07/12 0800   ceFAZolin (ANCEF) IVPB 1 g/50 mL premix     Comments: GIVE ON CALL TO IR 6/1 FOR DIALYSIS CATHETER PLACEMENT      1 g 100 mL/hr over 30 Minutes Intravenous On call 01/06/12 1706 01/07/12 1201   01/06/12 2200   ceFAZolin (ANCEF) IVPB 1 g/50 mL premix  Status:  Discontinued        1 g 100 mL/hr over 30 Minutes Intravenous  Once 01/06/12 2004 01/08/12 1939           Assessment/Plan  1.  ESRD: PD is non-functional and pt would still like to do PD long term.  Will need revision of PD cath as an outpatient.  Will need to be scheduled to see Dr. Johna Sheriff or Dr. Michaell Cowing as outpatient to have PD  cath revised.  Also will need f/u next week for suture removal.  Ok to d/c home from surgical standpoint.  Call over weekend if questions otherwise will see patient as OP.  2. If reimbursement issues allow PD in the future, we will revise or replace the PD catheter. Dr. Karie Soda and Dr. Glenna Fellows are  the 2 members of our surgery group  that perform these procedures.    LOS: 7 days    WHITE, ELIZABETH 01/13/2012  Angelia Mould. Derrell Lolling, M.D., [facility] General and Minimally invasive Surgery Breast and Colorectal Surgery Office:   [phone] Pager:   [phone]

## 2012-01-13 NOTE — Op Note (Signed)
OPERATIVE REPORT  DATE OF SURGERY: 01/13/2012  PATIENT: Mark Miranda, 41 y.o. male MRN: [id number]  DOB: [birth year]  PRE-OPERATIVE DIAGNOSIS: End-stage renal disease  POST-OPERATIVE DIAGNOSIS:  Same  PROCEDURE: Right IJ dietetic catheter with ultrasound visualization  SURGEON:  Gretta Began, M.D.  PHYSICIAN ASSISTANT: None  ANESTHESIA:  LMA  EBL: Minimal ml  Total I/O In: 50 [I.V.:50] Out: 0   BLOOD ADMINISTERED: None  DRAINS: None  SPECIMEN: None  COUNTS CORRECT:  YES  PLAN OF CARE: PACU with chest x-ray pending   PATIENT DISPOSITION:  PACU - hemodynamically stable  PROCEDURE DETAILS: The patient was taken to the operating room and placed supine position where the area of the right and left neck were imaged with ultrasound revealing widely patent jugular veins bilaterally. The patient was placed in Trendelenburg position and using local anesthesia and a finder needle the right internal jugular vein was accessed. Next using a Seldinger technique a guidewire was passed down the level the right atrium and this was confirmed with fluoroscopy. A dilator and peel-away sheath was passed over the guidewire and the dilator and guidewire removed. A hemodialysis catheter was passed through the peel-away sheath and the peel-away sheath was removed. The tips were placed at the level of the distal right atrium. The catheter was then brought through a subcutaneous tunnel through a separate stab incision. A 2 lm ports were attached in both lumens flushed and aspirated easily and were locked with 1000 unit per cc heparin. The catheter was secured to the skin with a 3-0 nylon stitch and the entry site was closed with a 4-0 subcuticular Vicryl stitch. Sterile dressing was applied and the patient was taken to the recovery room where chest x-ray is pending   Gretta Began, M.D. 01/13/2012 10:53 AM

## 2012-01-13 NOTE — H&P (View-Only) (Signed)
[facility] Progress Note  [name] is a 41 y.o. male who had a Left Brachial Cephalic AV fistula placed by Dr Darrick Penna on 01/10/12. He also had a PD catheter which is not functioning.  Dr. Briant Cedar requests placement of IJ Diatek to start HD  Dr. Arbie Cookey to place catheter tomorrow. Orders written.  Mark Miranda J, PA-C 11:08 AM 01/12/2012

## 2012-01-13 NOTE — Anesthesia Preprocedure Evaluation (Addendum)
Anesthesia Evaluation  Patient identified by MRN, date of birth, ID band Patient awake    Reviewed: Allergy & Precautions, H&P , NPO status , Patient's Chart, lab work & pertinent test results  Airway Mallampati: II      Dental   Pulmonary neg pulmonary ROS,          Cardiovascular hypertension, Pt. on medications Rhythm:Regular Rate:Normal     Neuro/Psych    GI/Hepatic negative GI ROS, Neg liver ROS,   Endo/Other  Diabetes mellitus-  Renal/GU CRFRenal disease     Musculoskeletal   Abdominal   Peds  Hematology   Anesthesia Other Findings   Reproductive/Obstetrics                           Anesthesia Physical Anesthesia Plan  ASA: III  Anesthesia Plan: General   Post-op Pain Management:    Induction: Intravenous  Airway Management Planned: LMA  Additional Equipment:   Intra-op Plan:   Post-operative Plan:   Informed Consent:   Plan Discussed with: CRNA, Anesthesiologist and Surgeon  Anesthesia Plan Comments: (GA LMA per pt request)       Anesthesia Quick Evaluation

## 2012-01-13 NOTE — Anesthesia Postprocedure Evaluation (Signed)
  Anesthesia Post-op Note  Patient: Mark Miranda  Procedure(s) Performed: Procedure(s) (LRB): INSERTION OF DIALYSIS CATHETER (Right)  Patient Location: PACU  Anesthesia Type: General  Level of Consciousness: awake  Airway and Oxygen Therapy: Patient Spontanous Breathing  Post-op Pain: mild  Post-op Assessment: Post-op Vital signs reviewed  Post-op Vital Signs: Reviewed  Complications: No apparent anesthesia complications

## 2012-01-13 NOTE — Preoperative (Signed)
Beta Blockers   Reason not to administer Beta Blockers:Not Applicable 

## 2012-01-13 NOTE — Anesthesia Postprocedure Evaluation (Signed)
  Anesthesia Post-op Note  Patient: Mark Miranda  Procedure(s) Performed: Procedure(s) (LRB): INSERTION OF DIALYSIS CATHETER (Right)  Patient Location: PACU  Anesthesia Type: General  Level of Consciousness: awake  Airway and Oxygen Therapy: Patient Spontanous Breathing  Post-op Pain: mild  Post-op Assessment: Post-op Vital signs reviewed  Post-op Vital Signs: Reviewed  Complications: No apparent anesthesia complications 

## 2012-01-13 NOTE — Procedures (Signed)
Pt seen on HD. AP 90 Vp 60.  SBP 151.  Tolerating it well.

## 2012-01-13 NOTE — Procedures (Unsigned)
CEPHALIC VEIN MAPPING  INDICATION:  Chronic kidney disease stage V  HISTORY:  EXAM: The right cephalic vein is compressible with diameter measurements ranging from 0.12 to 0.31 cm.  The right basilic vein is compressible with diameter measurements ranging from 0.17 to 0.33 cm.  The left cephalic vein is compressible with diameter measurements ranging from 0.21 to 0.33 cm.  The left basilic vein is compressible with diameter measurements ranging from 0.16 to 0.26 cm.  See attached worksheet for all measurements.  IMPRESSION:  Patent bilateral cephalic and basilic veins with diameter measurements as described above.  ___________________________________________ Janetta Hora. Fields, MD  CH/MEDQ  D:  01/03/2012  T:  01/03/2012  Job:  [id number]

## 2012-01-13 NOTE — Interval H&P Note (Signed)
History and Physical Interval Note:  01/13/2012 9:15 AM  Mark Miranda  has presented today for surgery, with the diagnosis of ESRD  The various methods of treatment have been discussed with the patient and family. After consideration of risks, benefits and other options for treatment, the patient has consented to  Procedure(s) (LRB): INSERTION OF DIALYSIS CATHETER (N/A) as a surgical intervention .  The patients' history has been reviewed, patient examined, no change in status, stable for surgery.  I have reviewed the patients' chart and labs.  Questions were answered to the patient's satisfaction.     Shia Eber

## 2012-01-14 ENCOUNTER — Inpatient Hospital Stay (HOSPITAL_COMMUNITY): Payer: Medicaid Other

## 2012-01-14 DIAGNOSIS — E1069 Type 1 diabetes mellitus with other specified complication: Secondary | ICD-10-CM

## 2012-01-14 DIAGNOSIS — E1029 Type 1 diabetes mellitus with other diabetic kidney complication: Secondary | ICD-10-CM

## 2012-01-14 DIAGNOSIS — N186 End stage renal disease: Secondary | ICD-10-CM

## 2012-01-14 DIAGNOSIS — I10 Essential (primary) hypertension: Secondary | ICD-10-CM

## 2012-01-14 LAB — RENAL FUNCTION PANEL
CO2: 25 mEq/L (ref 19–32)
Calcium: 8.5 mg/dL (ref 8.4–10.5)
Creatinine, Ser: 8.88 mg/dL — ABNORMAL HIGH (ref 0.50–1.35)
GFR calc non Af Amer: 7 mL/min — ABNORMAL LOW (ref >90)

## 2012-01-14 LAB — HEPATITIS B CORE ANTIBODY, TOTAL
Hep B Core Total Ab: NEGATIVE
Hep B Core Total Ab: NEGATIVE

## 2012-01-14 LAB — CBC
HCT: 27.7 % — ABNORMAL LOW (ref 39.0–52.0)
Hemoglobin: 9.6 g/dL — ABNORMAL LOW (ref 13.0–17.0)
MCH: 30.1 pg (ref 26.0–34.0)
MCHC: 34.7 g/dL (ref 30.0–36.0)

## 2012-01-14 LAB — GLUCOSE, CAPILLARY
Glucose-Capillary: 509 mg/dL — ABNORMAL HIGH (ref 70–99)
Glucose-Capillary: 569 mg/dL (ref 70–99)

## 2012-01-14 LAB — HEPATITIS B SURFACE ANTIBODY,QUALITATIVE: Hep B S Ab: NEGATIVE

## 2012-01-14 MED ORDER — INSULIN ASPART 100 UNIT/ML ~~LOC~~ SOLN
10.0000 [IU] | Freq: Once | SUBCUTANEOUS | Status: AC
  Administered 2012-01-14: 10 [IU] via SUBCUTANEOUS

## 2012-01-14 MED ORDER — NICOTINE 14 MG/24HR TD PT24: 1.0000 | MEDICATED_PATCH | Freq: Every day | TRANSDERMAL | Status: AC

## 2012-01-14 MED ORDER — SENNA-DOCUSATE SODIUM 8.6-50 MG PO TABS: 1.0000 | ORAL_TABLET | Freq: Two times a day (BID) | ORAL | Status: DC | PRN

## 2012-01-14 NOTE — Procedures (Signed)
Pt seen on HD.  Ap 120  Vp 150.  He will dialyse at [facility] MWF and is to be there at 9:30 mon.  Will have to arrange FU for PD cath as outpt.

## 2012-01-14 NOTE — Progress Notes (Signed)
Pt's blood sugar was elevated today.  At 9:40 - 459. MD called. Gave 20 units of 70/30, and 10 units Novolog SQ per MD At 11:43 - High. MD called. Gave 10 units Novolog SQ per MD At 12:34 - 569. MD called. Gave additional 10 units Novolog SQ per MD At 13:11 - 504. MD called. Per MD, ok to discharge pt, as CBG's seem to be starting to trend back down, and pt is eager to go home.

## 2012-01-14 NOTE — Discharge Instructions (Signed)
01/10/2012 Mark Miranda [id number] 04-Mar-1971  Surgeon(s): Sherren Kerns, MD  Procedure(s): ARTERIOVENOUS (AV) FISTULA CREATION-left brachiocephalic AVF        X Do not stick graft for 12 weeks       CHECK YOUR BLOOD SUGARS 4 TIMES PER DAY AND REPORT TO YOUR PRIMARY CARE PHYSICIAN.  PLEASE MAKE YOUR FOLLOW UP APPOINTMENTS.  PLEASE FOLLOW YOUR DIABETIC DIET.   RETURN IF SYMPTOMS RECUR, WORSEN OR NEW PROBLEMS DEVELOP.     Diabetes, tipo I (Type 1 Diabetes) La diabetes es una enfermedad de larga duracin (crnica). Ocurre cuando las clulas del pncreas estn destruidas y no pueden producir insulina. Esta enfermedad pueden habrsela transmitido sus padres(enfermedad gentica). Deber controlar el nivel de azcar en la sangre (glucosa) todos los [place]. Se establecer un plan de tratamiento para usted. CUIDADOS EN EL HOGAR  Nunca se quede sin insulina. Es necesario recibirla [employer].   No saltee las dosis de insulina.   No saltee comidas. Consuma una dieta saludable.   Siga su tratamiento y el plan de controles.   Use un colgante o una pulsera que indique que es diabtico y recibe [place].   Si comienza a Education administrator un nuevo deporte o actividad fsica, controle los sntomas de bajo nivel de Production assistant, radio. Podra necesitar un ajuste en la dosis de insulina.   Concurra regularmente a las consultas de seguimiento con el profesional que lo asiste.   Informe en su trabajo o su escuela acerca del plan de tratamiento de la diabetes.  SOLICITE AYUDA DE INMEDIATO SI:  Tiene problemas para mantener el nivel de glucosa en el rango indicado.   Tiene valores de glucosa que frecuentemente son demasiado altos o bajos.   Siente efectos adversos por los medicamentos prescriptos.   Tiene sntomas de enfermedad que no mejoran en 24 horas.   Tiene una llaga o herida que no se cura.   Nota cambios o un nuevo problema en la visin.   Tiene fiebre.  ASEGRESE  QUE:  Comprende estas instrucciones.   Controlar su enfermedad.   Solicitar ayuda inmediatamente si no mejora o si empeora.  Document Released: 10/21/2008 Document Revised: 07/14/2011 [facility] Patient Information 2012 [place], [place].  Monitoreo de Banker, adulto (Blood Sugar Monitoring, Adult) MEDIDORES DE GLUCOSA PARA EL AUTOCONTROL DE LA GLUCOSA EN SANGRE Para toda persona diabtica es importante poder medir correctamente su nivel de glucosa en sangre. Puede usar Occupational hygienist (un pequeo dispositivo operado a batera) para controlar su nivel de glucosa en cualquier momento. Esto le permite usted y a su mdico Chief Operating Officer su diabetes y Production assistant, radio efectividad del plan de tratamiento. El proceso de medicin de la propia glucosa en sangre con un medidor se denomina autocontrol de la glucosa en sangre. Cuando las personas diabticas se Development worker, community en sangre (glucosa), su salud mejora. Para controlar la glucosa con un medidor clsico, coloque una tira reactiva desechable. Luego coloque una gota de sangre en la tira reactiva. Coloque la Environmental consultant. Las tiras de prueba estn cubiertas con qumicos que se combinan con la glucosa de la [place]. El medidor indica la cantidad actual de glucosa. Este medidor muestra el nivel de glucosa en nmeros. Varios modelos nuevos pueden grabar y Academic librarian cierta cantidad de resultados de [place]. Algunos modelos pueden conectarse a ordenadores personales para [employer] de las pruebas o imprimirlas.  Los nuevos glucmetros con frecuencia son ms fciles de usar que los ms antiguos. Algunos  aparatos permiten extraer sangre de otras zonas adems de la yema del dedo. Algunos modelos nuevos poseen temporizador automtico, cdigos de error, lectores por seales o barra de cdigos para ayudar al ajuste correcto (calibracin). Otros tienen una pantalla grande o instrucciones habladas para las personas con dficits visuales.   INSTRUCCIONES PARA EL USO DEL MEDIDOR DE GLUCOSA  Lvese en las manos con agua y jabn o limpie la zonas con alcohol. Seque bien sus manos.   Pinche un lado de la yema del dedo con una lanceta (un pequeo instrumento manual con punta afilada).   Baje la mano y sostenga el dedo hasta que aparezca una pequea gota de [place]. Coloque la [employer].   Siga las instrucciones para insertar la tirilla y Lexicographer. El medidor debe encenderse y luego hay que insertar la tira reactiva antes de aplicar la [place].   Anote el resultado de la prueba.   Debe leer cuidadosamente las indicaciones tanto para el uso del medidor como de las tirillas. Las instrucciones para el uso del medidor se encuentran en el manual del usuario. Conserve el manual ya que podr ayudarlo a solucionar algunos problemas que puedan surgir. Muchos medidores usan "cdigos de error" cuando hay un problema con el aparato, la tirilla o la [place]. Necesitar el manual para interpretar estos cdigos de error y Product/process development scientist.   Nuevos dispositivos estn a la venta, Chief Financial Officer y medidores que pueden Education officer, environmental la prueba de sangre tomndola de "sitios alternativos" del organismo, [place] de los dedos. Sin embargo utilice pruebas estndar si su nivel de glucosa cambia rpidamente. Tambin use una prueba estndar si:   Ha comido, ha practicado actividad fsica o ha tomado insulina en las ltimas 2 horas.   Piensa que su nivel de glucosa es bajo.   Tiene tendencia a no sentir los sntomas de bajo nivel de glucosa (hipoglucemia).   Est enfermo o bajo una situacin de estrs.   Limpie el glucmetro segn las indicaciones del fabricante.   Pruebe el glucmetro segn las indicaciones del fabricante.   Lleve el medidor en su visita al consultorio del mdico. De este modo podr probar el medidor de glucosa mientras el profesional verifica su tcnica para Chief Financial Officer medicin y asegurarse  que est utilizando el medidor correctamente. El mdico tambin puede tomar [place] de sangre usando un mtodo de laboratorio de [place]. Si los valores del medidor de glucosa coinciden con los del laboratorio, usted y Mining engineer podrn comprobar que el medidor funciona bien y que est usando la Careers information officer. El mdico indicar qu hacer si los resultados no coinciden.  FRECUENCIA DE LA PRUEBA El mdico le aconsejara con qu frecuencia debe controlar su nivel de glucosa en sangre. Esto depender de su tipo de diabetes, como su nivel actual de control de diabetes y el tipo de medicamento que toma. A continuacin se indican pautas generales, pero su plan personal puede ser diferente. Registre [employer] y el momento del da para que su mdico pueda revisarlos.   Diabetes tipo 1.   Cuando [place] insulina con un buen control de la diabetes (ya sea con mltiples inyecciones diarias o a travs de una bomba), debe controlar su nivel de glucosa 4 veces por da.   Si la diabetes no puede controlarse adecuadamente, ser necesario un control ms frecuente, por ejemplo antes de las comidas y [place] horas despus de las mismas, en el momento de irse a dormir y  ocasionalmente las 2 y las 3 a.m.   Debe controlar siempre su nivel de glucosa antes de recibir una dosis de insulina o antes de modificar la proporcin en la bomba de insulina.   Diabetes tipo 2.   Las pautas para el auto control del nivel de glucosa en sangre en la diabetes tipo 2 no estn bien definidas.   Si recibe insulina, siga las pautas ya indicadas.   Si toma medicamentos pero no recibe insulina, y su nivel de glucosa no est bien controlado, debe verificarlos al [facility] por da.   Si no recibe insulina y su diabetes est controlada slo con medicamentos o dieta, debe verificar el nivel de glucosa en sangre al menos una vez por da, generalmente antes del desayuno.   Un perfil semanal podr ser de utilidad para que su  mdico lo aconseje acerca de su plan de cuidados. La semana anterior a su visita, controle su glucosa antes de las comidas, y 2 horas despus de las mismas diariamente. Puede realizar la prueba antes y despus de las distintas comidas de cada da para que usted y el mdico puedan ver si los niveles de International aid/development worker en sangre estn controlados en un perodo de 24 horas.   Diabetes gestacional.   Es necesario repetir la prueba con frecuencia. Es importante la precisin en el momento en que la realiza.   Si no recibe insulina, controle su nivel de glucosa 4 veces por da. antes del desayuno y 1 hora despus del inicio de cada comida.   Si utiliza insulina, controle su nivel de glucosa 6 veces por da. antes del desayuno y 1 hora despus del inicio de cada comida.   Normativas generales.   En el inicio de un tratamiento es necesario realizar controles ms frecuentes. El mdico lo asesorar.   Mida su nivel de glucosa en cada momento en que sospeche que tiene un bajo nivel de Production assistant, radio (hipoglucemia).   Deber controlarse con ms frecuencia cuando [employer], cuando pase por alguna situacin de estrs que no sea habitual, en caso de enfermedad, o en otras circunstancias poco frecuentes.  OTRAS COSAS QUE DEBE SABER ACERCA DE LOS GLUCMETROS  Amplitud de medida: La mayor parte de los medidores de glucosa pueden mostrar niveles comprendidos en un amplio margen de [place], desde cifras tan bajas como 0, hasta niveles tan elevados como 600 mg/dL. Si el [employer] niveles muy elevados o muy bajos, deber confirmarlos con otra medicin. Informe a su mdico cuando los valores sean demasiado elevados o demasiado bajos.   Niveles de glucosa en sangre completa vs. niveles de glucosa en plasma: Algunos dispositivos caseros antiguos medan la glucosa en toda la sangre. Cuando se hace en el laboratorio, o con algunos medidores nuevos, la glucosa se mide en el plasma (un componente de la  [place]). La diferencia puede ser importante. Es importante que usted y Mining engineer que lo asiste sepan si su medidor proporciona [employer] como "equivalente de sangre completa" o "equivalente de plasma".   Visualizacin de niveles elevados y bajos de glucosa: Parte del aprendizaje del manejo del medidor es el conocimiento del significado de [place]. Asegrese de Solicitor las concentraciones ms elevadas y ms bajas que el medidor indica.   Factores que afectan el rendimiento del Office manager. La precisin de [employer] de las pruebas dependen de muchos factores y varan segn la marca y el tipo de medidor. Aqu se incluyen:   Bajo recuento de glbulos rojos (  anemia).   Sustancias presentes en la sangre (cido rico, vitamina C y otros).   Factores ambientales (temperatura, humedad, altitud).   Tiras reactivas de marca versus tirar reactivas genricas.   Calibracin. Asegrese que su glucmetro est correctamente calibrado. Es Neomia Dear buena idea hacer una prueba de calibracin con la solucin de control recomendada por el fabricante, cada vez que comience a usar un envase nuevo de tiras reactivas. Esto ayuda a verificar la precisin del medidor.   Tiras reactivas mal almacenadas, vencidas o defectuosas. Mantenga las tiras Writer seco y bien tapadas.   Medidor daado.   Muestra de [employer]  NUEVAS TECNOLOGAS PARA LA MEDICIN DE LA GLUCOSA Lugares alternativos para tomar la muestra Algunos glucmetros permiten tomar muestras en sitios alternativos. Por ejemplo:  [place].   Antebrazo.   Base del pulgar.   Muslos.  Puede ser necesario tomar muestras en sitios alternativos. Sin embargo, esto puede tener algunas limitaciones. La sangre que se obtiene de la yema de los dedos puede mostrar modificaciones en los niveles de glucosa ms rpidamente que la sangre que proviene de otras partes del organismo. Esto significa que los resultados de las pruebas de  lugares alternativos pueden ser diferentes de los de la yema del dedo debido a que la concentracin presente de glucosa puede ser diferente y no por la capacidad del medidor para Printmaker prueba con precisin.  Control continuo de la glucosa en sangre Ya se dispone de algunos dispositivos para medir de Wellsite geologist continua la glucosa en sangre y otros estn en desarrollo. Estos mtodos pueden ser ms caros que el autocontrol con Occupational hygienist. Sin embargo, su efectividad y confiabilidad es incierta . El Brewing technologist acerca de adoptar o no este mtodo. CONTROLNDO SU NIVEL DE GLUCOSA EN SANGRE, LAS PERSONAS DIABTICAS TENDRN MS SALUD  El autocontrol de la glucosa es una parte importante dentro del plan de tratamiento de los pacientes que sufren diabetes mellitus. A continuacin se indican algunos motivos para realizar el autocontrol de glucosa en sangre:   Puede confirmar que su nivel de glucosa reencuentra en un nivel especfico, saludable.   Detecta hipoglucemia e hiperglucemia grave.   Le permite a usted y al mdico hacer ajustes en respuesta a los cambios en el estilo de vida en aquellas personas que requieren medicamentos.   Determina la necesidad de comenzar una terapia con insulina en la diabetes pasajera que se produce durante el embarazo (diabetes mellitus gestacional).  Document Released: 07/25/2005 Document Revised: 07/14/2011 [facility] Patient Information 2012 [place], [place].    Getting Ready For Dialysis When kidneys do not work well it is called kidney failure. This is sometimes called Chronic Kidney Disease (CKD). Kidney failure may happen gradually or suddenly. Kidneys stop working well for many different reasons. The most common causes are:  Diabetes.   High blood pressure.   Kidney infection.   Cysts in the kidneys.  TREATMENT OF KIDNEY FAILURE  Kidney transplant.   Hemodialysis.   Peritoneal dialysis.  KIDNEY TRANSPLANT: A kidney from another person is  put into your body. This kidney takes the place of your kidney.  Kidneys can be transplanted from:   A family member.   A friend.   A "brain-dead" person.   There are not enough kidneys for all the people who need them. Only a few people with kidney failure will get transplants.   To find out about kidney transplant, ask your doctor. Your doctor will tell you how to apply to get  a kidney.  DIALYSIS: There are 2 types of dialysis:  Hemodialysis.   Peritoneal dialysis.  You and your doctor will talk about which is best for you. Before you can start dialysis, you need surgery to make the "access" (a place on your body through which dialysis can be done). The access is meant to be permanent. About Hemodialysis: There are 2 ways to access your blood.  The best type of access is an arteriovenous fistula. This makes a good connection place for dialysis. Let this new access heal for 4-6 weeks.   If veins are small or have scars and cannot be used to make a fistula, then an arteriovenous graft can be made. It should be allowed to heal for 2-4 weeks.  Access allows your blood to flow through special tubes to a machine. There your blood is cleaned by a filter. You do not feel the blood moving. Only 1 cup of blood is out of your body at one time.  PERITONEAL DIALYSIS ACCESS: How peritoneal dialysis works: The peritoneal access is made by putting a tube into your belly. Peritoneal dialysis works inside Public relations account executive. It gets rid of waste, poisons and excess water. Your body's own membranes in the belly are used. This body part is like a thin, soft sheet which covers the organs inside your belly. This part works as the filter. During access surgery, the catheter is put directly through the wall of your abdomen.  BEFORE YOU HAVE ANY TYPE OF ACCESS SURGERY Things you can do to help yourself before surgery:  Be as healthy as you can before surgery. Stay healthy after surgery. Talk to your doctor about  ways to:   Lose weight if you are too heavy.   Stop smoking.   Exercise and keep active.   Lessen stress.   Take your high blood pressure or diabetes medicine. Keep your blood pressure or blood sugar at a healthy level.   Find out all you can about kidney disease and the treatment and surgery for it. Understand what your lab tests mean. Ask questions until you understand.   If you will be getting a fistula or graft in your arm, exercise your arm muscles by squeezing and letting go of a rubber ball in your hand. Do this 4 times a day (breakfast, lunch, dinner, bedtime). Each time, squeeze the ball 125 times.   Most often the surgery is done in the Day Surgery Unit at the hospital.   For fistula or graft surgery, you will be given a shot in your under-arm so that you will have no feeling in the arm for awhile. You will also be given medicine to help you relax and maybe even sleep.   For all access surgeries, do not eat or drink for at least 8 hours before your surgery.   For peritoneal access surgery, it will be important to clean out your bowels very well. Ask your doctor which laxative to use.  TAKING CARE OF YOUR HEMODIALYSIS ACCESS AFTER SURGERY:  For 2 days keep your arm raised on pillows. Keep your arm above your heart when resting or sleeping. Do not put a heating pad on your arm.   You may use an ice pack for the first 6 - 12 hours after surgery. A bag of frozen peas wrapped in a thin cloth makes a good cold pack. Put it outside the area of the gauze bandage. Put it on the sides and back of your arm. Never put the  ice pack directly on top of your fistula or graft.   Every day, look closely at the surgery area. Watch for signs of infection. Some mild swelling and discomfort is normal for about 2 weeks.   You may also have some bruises around your fistula or graft. This is normal. A tiny bit of bleeding is also normal.   At least twice a day you should check your fistula or graft  to see if it is "working". Do this with three fingertips laid lightly above the surgery area. If you have a fistula, you should feel a "buzz" or "thrill". This means blood is flowing well. If you have a graft, you should feel a strong pulse. If you do not feel anything, call your kidney doctor right away.   If you hurt, take the pain medicine your doctor prescribed. It is best to take pain medicine for only 1 or 2 days after surgery.   Usually, there are no stitches that need to be taken out. The tape strips may stay on for 7 - 10 days and fall off by themselves. A loose bandage may be put over your arm. This will cover the place where the surgery was done. Do not wrap or tape tight bandages all the way around your arm.   To keep the blood in your fistula or graft from clotting:   Do not sleep on your arm.   Do not keep your elbow bent for long periods of time. Never use a sling.   Do not wear tight sleeves or anything binding on your arm.   Do not let anyone except dialysis doctors and nurses stick needles into your fistula or graft.   Do not let the arm with the fistula or graft be used to check your blood pressure.   Do not carry heavy things with the fistula or graft arm.   Ask your doctor when you can get your arm wet. Dry your fistula or graft by patting it gently with a clean towel. Do not rub it dry.   After 5 days, begin mild exercise of your arm. Squeeze and let go of a rubber ball (or a rolled-up sock) in your hand. Do this 4 times a day. Each time, squeeze 125 times.   Every time you go to the center for dialysis wash your access arm well. Wash it for 2 minutes with soap and water before your treatment. Pat it dry gently.   Remind your dialysis nurse to stick your access in a different place each time.   If you have any questions or problems, call the Vascular Access Nurse. Be sure you have check-up appointments at 1 - 2 weeks and 6 weeks after surgery.  CALL YOUR DOCTOR  RIGHT AWAY IF:  You have no feeling, tingling or pain in your hand.   You are not able to move your hand.   Your arm is red, swollen or has pain.   Your surgery area keeps bleeding.   You have drainage of any kind from your wound.   You have a fever over 100 F (37.7 C).  POSSIBLE PROBLEMS WITH YOUR HEMODIALYSIS ACCESS Sometimes an access may have a problem such as:  Clotting.   Weak, balloon-like places on your fistula.   Not working well.  If a second surgery is needed to remove blood clots or to fix other problems, your access can still be used for dialysis soon afterward. If not your doctor will let you know.  If you have already had a second, corrective surgery, re-read Taking Care of Your Hemodialysis Access After Surgery above. TAKING CARE OF YOUR PERITONEAL DIALYSIS ACCESS AFTER SURGERY:  After surgery you will have some pain. This is normal. Take the pain medicine that your doctor prescribed for you.   It is best to take pain medicine for only 1 or 2 days after surgery. If you need to take it longer than this, ask your doctor about a mild laxative. Pain medicine can cause constipation.   For the next 2 weeks, do not lift anything that weighs more than 5 pounds. A half-gallon of milk weighs about 4 pounds. Do not strain or tighten the muscles of your belly.   There are no stitches that need to be taken out. Leave the bandage on until you go to the dialysis unit for training.   If you have any questions or problems, call the Vascular Access Nurse. Be sure you have check up appointments at 1 - 2 weeks and 6 weeks after surgery.   Make sure you also have an appointment at your dialysis center to be trained to do your peritoneal dialysis. Call your dialysis center to start your training.  GET HELP RIGHT AWAY IF:   The place around your tube is red, swollen or has pain.   There are bumps.   Your skin feels hot.   Your surgery area keeps on bleeding.   You have  drainage of any kind from your wound.   You have a fever over 100 F (37.7 C).  Document Released: 07/13/2009 Document Revised: 07/14/2011 Document Reviewed: 07/13/2009 [facility] Patient Information 2012 [place], [place].  Dialysis Care After Dialysis is a treatment that removes the toxic wastes from the body when the kidneys fail to do this on their own. There are 2 types of dialysis:  Hemodialysis. Blood is pumped from the body through a filter (dialyzer). The blood is cleaned of waste products and returned to the body. Hemodialysis is performed in a dialysis center for 3 to 4 hours, 3 times a week. Dialysis is performed through an arteriovenous (AV) access, which provides access to the blood vessel. The main advantage of hemodialysis is that no training is required of the patient. Disadvantages include potential AV access failure and lack of freedom, as you must stay relatively near a dialysis center.   Peritoneal dialysis. The body's own lining (membrane) is used as a filter. A fluid drains in and out of the abdomen to get rid of the body's toxic wastes. Advantages are that it can be taught to the patient and can be done at home with careful technique. It allows more freedom and less discomfort. Disadvantages include potential inflammation inside the abdomen (peritonitis) and membrane failure.  HOME CARE INSTRUCTIONS  If you have an AV access:   Check for a "thrill" at your access site every day.This is a vibrating or buzzing feeling when you place your fingers over the access site.This means the access is working. If you do not feel a "thrill," the access needs to be repaired.   Never wear tight clothing or jewelry around the access.   Avoid sleeping on the access. This can decrease circulation and can cause the access to clot.   Keep the bandage (dressing) on for a couple hours after your treatment or as told by your caregiver. Avoid getting your dressing wet. If the dressing comes off,  cover the access site with a 4x4 gauze dressing and tape securely.  Keep your access site clean to prevent infection.   Keep some 4x4 gauze dressings at home in case your access starts to bleed. You may have received a medicine that can cause bleeding called heparin with your treatment.   Control your blood pressure. High blood pressure (hypertension) damages your heart and vessels. It is important to exercise as much as possible.   Keep your cholesterol under control. Exercise regularly or as directed.  SEEK MEDICAL CARE IF:  You do not feel a "thrill" in your AV access.   You have a fever, chills, sweats, or weakness.   You have a small amount of bleeding at your access site.   Your blood pressure is too high.  SEEK IMMEDIATE MEDICAL CARE IF:  You have sudden chest pain or trouble breathing.   You have bleeding that cannot be stopped or controlled with direct pressure.  MAKE SURE YOU:  Understand these instructions.   Will watch your condition.   Will get help right away if you are not doing well or get worse.  Document Released: 02/14/2005 Document Revised: 07/14/2011 Document Reviewed: 08/31/2010 [facility] Patient Information 2012 [place], [place].Dialysis Care After Dialysis is a treatment that removes the toxic wastes from the body when the kidneys fail to do this on their own. There are 2 types of dialysis:  Hemodialysis. Blood is pumped from the body through a filter (dialyzer). The blood is cleaned of waste products and returned to the body. Hemodialysis is performed in a dialysis center for 3 to 4 hours, 3 times a week. Dialysis is performed through an arteriovenous (AV) access, which provides access to the blood vessel. The main advantage of hemodialysis is that no training is required of the patient. Disadvantages include potential AV access failure and lack of freedom, as you must stay relatively near a dialysis center.   Peritoneal dialysis. The body's own lining  (membrane) is used as a filter. A fluid drains in and out of the abdomen to get rid of the body's toxic wastes. Advantages are that it can be taught to the patient and can be done at home with careful technique. It allows more freedom and less discomfort. Disadvantages include potential inflammation inside the abdomen (peritonitis) and membrane failure.  HOME CARE INSTRUCTIONS  If you have an AV access:   Check for a "thrill" at your access site every day.This is a vibrating or buzzing feeling when you place your fingers over the access site.This means the access is working. If you do not feel a "thrill," the access needs to be repaired.   Never wear tight clothing or jewelry around the access.   Avoid sleeping on the access. This can decrease circulation and can cause the access to clot.   Keep the bandage (dressing) on for a couple hours after your treatment or as told by your caregiver. Avoid getting your dressing wet. If the dressing comes off, cover the access site with a 4x4 gauze dressing and tape securely.   Keep your access site clean to prevent infection.   Keep some 4x4 gauze dressings at home in case your access starts to bleed. You may have received a medicine that can cause bleeding called heparin with your treatment.   Control your blood pressure. High blood pressure (hypertension) damages your heart and vessels. It is important to exercise as much as possible.   Keep your cholesterol under control. Exercise regularly or as directed.  SEEK MEDICAL CARE IF:  You do not feel a "thrill"  in your AV access.   You have a fever, chills, sweats, or weakness.   You have a small amount of bleeding at your access site.   Your blood pressure is too high.  SEEK IMMEDIATE MEDICAL CARE IF:  You have sudden chest pain or trouble breathing.   You have bleeding that cannot be stopped or controlled with direct pressure.  MAKE SURE YOU:  Understand these instructions.   Will watch  your condition.   Will get help right away if you are not doing well or get worse.  Document Released: 02/14/2005 Document Revised: 07/14/2011 Document Reviewed: 08/31/2010 [facility] Patient Information 2012 [place], [place].

## 2012-01-14 NOTE — Procedures (Deleted)
.  I was present at this dialysis session. I have reviewed the session itself and made appropriate changes.   Vinson Moselle, MD [employer] 01/14/2012, 8:57 AM

## 2012-01-14 NOTE — Discharge Summary (Addendum)
Physician Discharge Summary  Patient ID: Mark Miranda MRN: [id number] DOB/AGE: 39-May-1972 39 y.o.  Admit date: 01/06/2012 Discharge date: 01/14/2012  Admission Diagnoses: Present on Admission:  .Leukocytosis .DM (diabetes mellitus) type I uncontrolled with renal manifestation .End stage renal disease .HTN (hypertension) .Hypoglycemia .Uremia  Discharge Diagnoses:  Principal Problem:  *End stage renal disease Active Problems:  HTN (hypertension)  DM (diabetes mellitus) type I uncontrolled with renal manifestation  Leukocytosis  Hypoglycemia  Uremia   Discharged Condition: stable  Hospital Course:   ESRD  -Patient was sent from his nephrologist office to initiate hemodialysis.  -After discussion with his nephrologist, decision was made to start peritoneal dialysis using his PD catheter but catheter did not function and pt was started on HD in the hospital.  -Patient had left brachiocephalic AV fistula placed by Dr. Darrick Penna. Pt to start HD MWF on Monday.    -Pt received permcath for HD and tolerated it very well as well as first HD session  Leukocytosis  -With marked neutrophilia, but this seems chronic.  -No signs of infections. And apparently resolved now, without intervention.   Hypoglycemia  -cut way back on insulin doses in the hospital because pt was not eating well and did not like the renal diet. no recurrence noted.    Diabetes mellitus type 1  -Restarted back on his insulin. Hemoglobin A1c is 5.8.  -significant hypoglycemia resolved now that pt is eating -Pt has had some hyperglycemia on day of discharge.  Family has been bringing in food for the patient from outside of hospital.  Pt has been resistant to diabetic and renal diet.  Will give him additional insulin coverage and recheck and have him resume his previous home doses of insulin at discharge as he will be starting back on his home diet regimen.   He was given instructions to test his blood sugars 4 times  per day and report to his PCP.    Hypertension  -Controlled, continue preadmission medications   Consults: nephrology, general surgery and vascular surgery  Discharge Exam: Pt is anxious to go home, he denies any complaints.  Family has been giving him food from outside of hospital and BS have gone way up.  We gave additional insulin to patient today.   Blood pressure 131/71, pulse 70, temperature 98.8 F (37.1 C), temperature source Oral, resp. rate 16, height 5\' 8"  (1.727 m), weight 59.1 kg (130 lb 4.7 oz), SpO2 96.00%. General: Alert and awake oriented x3 not in any acute distress.  HEENT: anicteric sclera, pupils equal reactive to light and accommodation  CVS: S1-S2 heard, 2/6 systolic murmur  Chest: clear to auscultation bilaterally, no wheezing rales or rhonchi  Abdomen: port in place, wound clean and dry, normal bowel sounds, soft, nontender, nondistended, no organomegaly  Neuro: Cranial nerves II-XII intact, no focal neurological deficits  Extremities: no cyanosis  Disposition: 01-Home or Self Care  Discharge Orders    Future Appointments: Provider: Department: Dept Phone: Center:   01/18/2012 8:30 AM [facility] [phone] None   02/08/2012 8:45 AM Ardeth Sportsman, MD [facility] [phone] None   02/16/2012 3:30 PM Sherren Kerns, MD [facility] [phone] [facility]     Future Orders Please Complete By Expires   Resume previous diet      Driving Restrictions      Comments:   No driving for 2 days   Lifting restrictions      Comments:   No lifting for 6 weeks  Call MD for:  temperature >100.5      Call MD for:  redness, tenderness, or signs of infection (pain, swelling, bleeding, redness, odor or green/yellow discharge around incision site)      Call MD for:  severe or increased pain, loss or decreased feeling  in affected limb(s)      Increase activity slowly      Comments:   Walk with assistance use walker or cane as needed   Increase  activity slowly        Medication List  As of 01/14/2012 12:23 PM   TAKE these medications         amLODipine 10 MG tablet   Commonly known as: NORVASC   Take 10 mg by mouth at bedtime.      calcium carbonate 500 MG chewable tablet   Commonly known as: TUMS - dosed in mg elemental calcium   Chew 1 tablet by mouth 3 (three) times daily.                  insulin aspart protamine-insulin aspart (70-30) 100 UNIT/ML injection   Commonly known as: NOVOLOG 70/30   Inject 15-30 Units into the skin 2 (two) times daily with a meal. Inject 30 units in the morning, and inject 15 units at night.      nicotine 14 mg/24hr patch   Commonly known as: NICODERM CQ - dosed in mg/24 hours   Place 1 patch onto the skin daily.      oxyCODONE 5 MG immediate release tablet   Commonly known as: Oxy IR/ROXICODONE   Take 1 tablet (5 mg total) by mouth every 6 (six) hours as needed for pain.      oxyCODONE-acetaminophen 5-325 MG per tablet   Commonly known as: PERCOCET   Take 1 tablet by mouth every 6 (six) hours as needed.      rosuvastatin 10 MG tablet   Commonly known as: CRESTOR   Take 10 mg by mouth at bedtime.      sildenafil 50 MG tablet   Commonly known as: VIAGRA   Take 50 mg by mouth daily as needed. For E.D.           Follow-up Information    Follow up with Sherren Kerns, MD in 4 weeks. (Office will call you to arrange your appt (sent))    Contact information:   [address] [place] [address] [phone]       Follow up with Ardeth Sportsman., MD. (02/08/12 at 8:45 to discuss PD catheter)    Contact information:   [facility] [address] [place] [address] [phone]       Follow up with [facility], MD. (01/18/12 at 8:30 for suture removal)    Contact information:   [address] [place] [address] [phone]       Follow up with HEMODIALYSIS ON MONDAY in 2 days. (FOR HEMODIALYSIS)             SPENT 38 mins preparing discharge, reviewing consult notes, records, writing prescriptions, etc.    Signed: Jeanpaul Biehl 01/14/2012, 12:23 PM

## 2012-01-16 ENCOUNTER — Encounter (HOSPITAL_COMMUNITY): Payer: Self-pay | Admitting: Vascular Surgery

## 2012-01-17 ENCOUNTER — Telehealth (INDEPENDENT_AMBULATORY_CARE_PROVIDER_SITE_OTHER): Payer: Self-pay

## 2012-01-17 NOTE — Telephone Encounter (Signed)
Spoke with Neysa Bonito at [facility] about the pt's PD catheter. Dr. Derrell Lolling replaced the catheter which he said would not flush.  Had questioned Dr. Briant Cedar whether the patient would receive peritoneal or hemodialysis, but never received a response.  He recommended Dr. Johna Sheriff or Dr. Michaell Cowing for revision of the catheter.  Neysa Bonito will follow up with Dr. Briant Cedar about this patient.

## 2012-01-18 ENCOUNTER — Encounter (INDEPENDENT_AMBULATORY_CARE_PROVIDER_SITE_OTHER): Payer: Self-pay

## 2012-02-08 ENCOUNTER — Ambulatory Visit (INDEPENDENT_AMBULATORY_CARE_PROVIDER_SITE_OTHER): Payer: PRIVATE HEALTH INSURANCE | Admitting: Surgery

## 2012-02-08 ENCOUNTER — Encounter (INDEPENDENT_AMBULATORY_CARE_PROVIDER_SITE_OTHER): Payer: Self-pay | Admitting: Surgery

## 2012-02-08 VITALS — BP 150/78 | HR 88 | Temp 99.0°F | Ht 68.0 in | Wt 140.8 lb

## 2012-02-08 DIAGNOSIS — N186 End stage renal disease: Secondary | ICD-10-CM

## 2012-02-08 DIAGNOSIS — T85611A Breakdown (mechanical) of intraperitoneal dialysis catheter, initial encounter: Secondary | ICD-10-CM | POA: Insufficient documentation

## 2012-02-08 DIAGNOSIS — T85691A Other mechanical complication of intraperitoneal dialysis catheter, initial encounter: Secondary | ICD-10-CM

## 2012-02-08 NOTE — Progress Notes (Signed)
Subjective:     Patient ID: Mark Miranda, male   DOB: 08-21-1970, 41 y.o.   MRN: [id number]  HPI  Earley Grobe  09/08/1970 [id number]  Patient Care Team: Jaclyn Shaggy, MD as PCP - General (Family Medicine) Hartley Barefoot. Allena Katz, MD (Nephrology)  This patient is a 41 y.o.male who presents today for surgical evaluation at the request of Dr. Allena Katz.   Reason for evaluation: Nonfunctioning peritoneal dialysis catheter.  Pleasant male with progressive chronic kidney disease.  He had a Moncrief-Popovich catheter placed & tip in the SQ by Dr. Zachery Dakins last year.  Dr. Zachery Dakins has retired.  The patient's kidney disease progressed to end-stage.  He was admitted.  It was externalized urgently by Dr. Derrell Lolling last month.  Initially flushed well.  Quickly became nonfunctional.  Is on hemodialysis now.  Is very interested in trying a peroneal dialysis catheter again.  Therefore, the patient was sent to me to see if he can be salvaged/replaced.  Patient denies any fevers or chills.  Relatively active.  Had some constipation but that is improved.  No recent skin problems  Patient Active Problem List  Diagnosis  . HTN (hypertension)  . 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 Kidney disease or stones  . End stage renal disease  . Leukocytosis  . Hypoglycemia  . Uremia  . Peritoneal dialysis catheter dysfunction    Past Medical History  Diagnosis Date  . Diabetes mellitus   . Renal disorder   . Hypertension   . Hypercholesterolemia     Past Surgical History  Procedure Date  . Peritoneal catheter insertion   . Capd insertion 01/09/2012    Procedure: CONTINUOUS AMBULATORY PERITONEAL DIALYSIS  (CAPD) CATHETER INSERTION;  Surgeon: Ernestene Mention, MD;  Location: [facility] OR;  Service: General;  Laterality: N/A;  Exteriorization PD Cath  . Av fistula placement 01/10/2012    Procedure: ARTERIOVENOUS (AV) FISTULA CREATION;  Surgeon: Sherren Kerns, MD;  Location: [facility] OR;   Service: Vascular;  Laterality: Left;  Left Brachial Cephalic Arteriovenous Fistula  . Insertion of dialysis catheter 01/13/2012    Procedure: INSERTION OF DIALYSIS CATHETER;  Surgeon: Larina Earthly, MD;  Location: [facility] OR;  Service: Vascular;  Laterality: Right;  Internal Jugular    History   Social History  . Marital Status: Married    Spouse Name: N/A    Number of Children: N/A  . Years of Education: N/A   Occupational History  . Not on file.   Social History Main Topics  . Smoking status: Current Everyday Smoker -- 1.0 packs/day    Types: Cigarettes  . Smokeless tobacco: Never Used  . Alcohol Use: No  . Drug Use: No  . Sexually Active: Not on file   Other Topics Concern  . Not on file   Social History Narrative  . No narrative on file    Family History  Problem Relation Age of Onset  . Diabetes Mother     Current Outpatient Prescriptions  Medication Sig Dispense Refill  . amLODipine (NORVASC) 5 MG tablet Take 5 mg by mouth daily.      . calcium carbonate (TUMS - DOSED IN MG ELEMENTAL CALCIUM) 500 MG chewable tablet Chew 1 tablet by mouth 3 (three) times daily.      . insulin aspart protamine-insulin aspart (NOVOLOG 70/30) (70-30) 100 UNIT/ML injection Inject 15-30 Units into the skin 2 (two) times daily with a meal. Inject 30 units in the morning, and inject 15 units  at night.      . nicotine (NICODERM CQ - DOSED IN MG/24 HOURS) 14 mg/24hr patch Place 1 patch onto the skin daily.  28 patch  0  . rosuvastatin (CRESTOR) 10 MG tablet Take 10 mg by mouth at bedtime.      . sildenafil (VIAGRA) 50 MG tablet Take 50 mg by mouth daily as needed. For E.D.      . DISCONTD: diltiazem (CARDIZEM) 30 MG tablet Take 30 mg by mouth 4 (four) times daily. Ask patient to verify dosage and frequency. Not indicated on medical history form dated 10/23/10.       Marland Kitchen DISCONTD: insulin lispro (HUMALOG) 100 UNIT/ML injection Inject into the skin 3 (three) times daily before meals. Ask patient to  verify name/dosage/. History form noted dosage of 34 units during day and 22 units at night.          No Known Allergies  BP 150/78  Pulse 88  Temp 99 F (37.2 C) (Temporal)  Ht 5\' 8"  (1.727 m)  Wt 140 lb 12.8 oz (63.866 kg)  BMI 21.41 kg/m2  SpO2 98%  Dg Abd 1 View  01/10/2012  *RADIOLOGY REPORT*  Clinical Data: Assess position of peritoneal dialysis catheter  ABDOMEN - 1 VIEW  Comparison: None.  Findings: Peritoneal dialysis catheter is present in the right lower quadrant and it is coiled in the right side of the pelvis. No disproportionate dilatation of bowel.  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  No obvious free intraperitoneal gas.  IMPRESSION: Peritoneal dialysis catheter.  Nonobstructive bowel gas pattern.  Original Report Authenticated By: Donavan Burnet, M.D.   Dg Chest Port 1 View  01/13/2012  *RADIOLOGY REPORT*  Clinical Data: Postop dialysis catheter.  PORTABLE CHEST - 1 VIEW  Comparison: 01/06/2012 and 11/30/2010.  Findings: Right sided split type catheter is in place with tips in the region of the right atrium and caval atrial junction.  No pneumothorax.  Cardiomegaly.  Central pulmonary vascular prominence without pulmonary edema.  Slightly tortuous aorta.  No segmental infiltrate.  IMPRESSION: Right sided split type catheter is in place with tips in the region of the right atrium and caval atrial junction.  Cardiomegaly.  Original Report Authenticated By: Fuller Canada, M.D.   Dg Fluoro Guide Cv Line-no Report  01/13/2012  CLINICAL DATA: Insertion dialysis catheter   FLOURO GUIDE CV LINE  Fluoroscopy was utilized by the requesting physician.  No radiographic  interpretation.       Review of Systems  Constitutional: Negative for fever, chills and diaphoresis.  HENT: Negative for nosebleeds, sore throat, facial swelling, mouth sores, trouble swallowing and ear discharge.   Eyes: Negative for photophobia, discharge and visual disturbance.    Respiratory: Negative for choking, chest tightness, shortness of breath and stridor.   Cardiovascular: Negative for chest pain and palpitations.  Gastrointestinal: Negative for nausea, vomiting, abdominal pain, diarrhea, constipation, blood in stool, abdominal distention, anal bleeding and rectal pain.  Genitourinary: Negative for dysuria, urgency, hematuria, discharge, enuresis, penile pain and testicular pain.  Musculoskeletal: Negative for myalgias, back pain, arthralgias and gait problem.  Skin: Negative for color change, pallor, rash and wound.  Neurological: Negative for dizziness, speech difficulty, weakness, numbness and headaches.  Hematological: Negative for adenopathy. Does not bruise/bleed easily.  Psychiatric/Behavioral: Negative for hallucinations, confusion and agitation.       Objective:   Physical Exam  Constitutional: He is oriented to person, place, and time. He appears well-developed and well-nourished. No  distress.  HENT:  Head: Normocephalic.  Mouth/Throat: Oropharynx is clear and moist. No oropharyngeal exudate.  Eyes: Conjunctivae and EOM are normal. Pupils are equal, round, and reactive to light. No scleral icterus.  Neck: Normal range of motion. Neck supple. No tracheal deviation present.  Cardiovascular: Normal rate, regular rhythm and intact distal pulses.   Pulmonary/Chest: Effort normal and breath sounds normal. No respiratory distress.  Abdominal: Soft. He exhibits no distension. There is no tenderness. Hernia confirmed negative in the right inguinal area and confirmed negative in the left inguinal area.    Musculoskeletal: Normal range of motion. He exhibits no tenderness.  Lymphadenopathy:    He has no cervical adenopathy.       Right: No inguinal adenopathy present.       Left: No inguinal adenopathy present.  Neurological: He is alert and oriented to person, place, and time. No cranial nerve deficit. He exhibits normal muscle tone. Coordination  normal.  Skin: Skin is warm and dry. No rash noted. He is not diaphoretic. No erythema. No pallor.  Psychiatric: He has a normal mood and affect. His behavior is normal. Judgment and thought content normal.       Assessment:     CAPD catheter malfunction.  Probable omental adhesions    Plan:     The anatomy & physiology of peritoneum was discussed.  Natural history risks without surgery of worsening renal failure was discussed.   I feel the risks of no intervention will lead to serious problems that outweigh the operative risks; therefore, I recommended repositioning / replaceement of a peritoneal dialysis catheter.  I explained laparoscopic techniques with possible need for an open approach.  Probable omentopexy.  Risks such as bleeding, infection, abscess, injury to other organs, catheter occlusion or malpositioning, reoperation to remove/reposition the catheter, heart attack, death, and other risks were discussed.   I noted a good likelihood this will help address the problem.  Possibility that this will not be enough to compensate for the renal failure & need for further treatment such as hemodialysis was explained.  Goals of post-operative recovery were discussed as well.  We will work to minimize complications.   The patient is/will be getting training on catheter use by dialysis nursing before and after surgery.  I stressed the importance of meticulous care & sterile technique to prevent catheter problems.  Questions were answered.  The patient expresses understanding & wishes to proceed with surgery.

## 2012-02-08 NOTE — Patient Instructions (Signed)
Peritoneal Dialysis Dialysis can be done using a machine outside of the body (hemodialysis). Or, it can be done inside the body (peritoneal dialysis). The word "peritoneal" refers to the lining or membrane of the belly (abdominal cavity). The peritoneal membrane is a thin, plastic-like lining inside the belly that covers the organs and fits in the abdominal or peritoneal cavity, such as the stomach, liver and the kidneys. This lining works like a filter. It will allow certain things to pass from your blood through the lining and into a special solution that has been placed into your belly. In this type of dialysis, the peritoneum is used to help clean the blood.  If you need dialysis, your kidneys are not working right. Healthy kidneys take out extra water and waste products, which becomes urine. When the kidneys do not do this, serious problems can develop. The waste and water build up in the blood. Your hands and feet might swell. You may feel tired, weak or sick to your stomach. Also, your blood pressure may rise. If not treated, you could die. Dialysis is a treatment that does the work that your kidneys would do if they were healthy.  It cleans your blood.   It will make sure your body has the right amount of certain chemicals that it needs. They include potassium, sodium and bicarbonate.   It will help control your blood pressure.  UNDERSTANDING PERITONEAL DIALYSIS  Here is how peritoneal dialysis works:   First, you will have surgery to put a soft plastic tube (catheter) into your belly (abdomen). This will allow you to easily connect yourself to special tubing, which will then let a special dialysis solution to be placed into your abdomen.   For each treatment, you will need at least one bag of dialysis solution (a liquid called dialysate). It is a mix of water that is pure and free of germs (sterile), sugar (dextrose) and the nutrients and minerals found in your blood. Sometimes, more than one  bag is needed to get the right amount of fluid for your abdomen. Your caregiver will explain what size and how many bags you will need.   The dialysate is slowly put through the catheter to fill the abdomen (called the peritoneal cavity). This dialysate will need to stay in your body for 3-4 hours. This is known as the dwell time.   The solution is working to clean the blood and remove wastes from your body. At the end of this time, the solution is drained from your body through tubing into an empty bag. It is then replaced with a fresh dialysate.   The draining and replacing of the dialysate is called an exchange or cycle. The catheter is capped after each exchange. Once the solution is in your body, you are then free to do whatever you would like until the next exchange. Most people will need to do 4-5 exchanges each day.   There are two different methods that can be used.   Continuous ambulatory peritoneal dialysis (CAPD): You put the solution into your abdomen, cap your catheter and then go about your day. Several hours later, you reconnect to a tubing set up, drain out the solution and then put more solution in. This is done several times a day. No machine is needed.   Continuous cycler-assisted peritoneal dialysis (CCPD): A machine is used, which fills the abdomen with dialysate and then drains it. This happens several times. It usually is done at night while you   are sleeping. When you wake up, you can disconnect from the machine and are free to go to go about your day.  PREPARING FOR EXCHANGES  Discuss the details of the procedure with your caregivers. You will be working with a nurse who is specially trained in doing dialysis. Make sure you understand:   How to do an exchange.   How much solution you need.   What type of solution you will need.   How often you should do an exchange. Ask:   How many times each day?   When? At meals? At bedtime?   Always keep the dialysate bags and  other supplies in a cool, clean and dry place.   Keeping everything clean is very important.   The catheter and its cap must be free from germs (sterile)   The adapter also must be sterile. It attaches the dialysis bag and tubing to the catheter.   Clean the area of your body around the catheter every day. Use a chemical that fights infection (antiseptic).   Wash your hands thoroughly before starting an exchange.   You may be taught to wear a mask to cover your nose and mouth. This makes infection less likely to happen.   You may be taught to close doors, windows and turn off any fans before doing an exchange.   Check the dialysate bag very carefully.   Make sure it is the right size bag for you. This information is on the label.   Also, make sure it is the right mixture. For some people, the dialysate contents vary. For instance, the mixture might be a stronger solution for overnight.   Check the expiration date (the last date you can use the bag). It also is on the label. If the date has gone by, throw away the bag.   The solution should be clear. You should be able to see any writing on the side of the bag clearly through the solution. Do not use a cloudy solution.   Gently squeeze the bag to make sure there are no leaks.   Use a dry heating pad to warm the dialysate in the bag. Leave the cover on the bag while you do this.   This is for comfort. You can skip this step if you want.   Never place the bag of solution under warm or hot water. Water from a faucet is not sterile and could cause germs to get into the bag. Infection could then result.  PERFORMING AN EXCHANGE  For continuous ambulatory dialysis:   Attach the dialysis bag and tubing to your catheter. Hang the bag so that gravity (the natural downward pull) draws the solution down and into your abdomen once the clamps are opened. This should take about 10 minutes.   Remove the bag and tubing from the catheter. Cap the  catheter.   The solution stays in the abdomen for 3-4 hours (dwell time). The solution is working to clean the blood and remove wastes from your body.   When you are ready to drain the solution for another exchange, take the cap off the catheter. Then, attach the catheter to tubing, which is attached to an empty bag. Place this empty bag below the abdomen or on the floor or stool and undo the clamps.   Gravity helps pull the fluid out of the abdomen and into the bag. The fluid in the bag may look yellow and clear, like urine. It usually takes about 20   minutes to drain the fluid out of the abdomen.   When the solution has drained, start the process again by infusing a new bag of dialysate and then capping the catheter.   This should continue until you have used all of the solution that you are to use each day.   Sometimes, a small machine is used overnight. It is called a mini-cycler. This is done if the body cannot go all night without an exchange. The machine lets you sleep without having to get up and do an exchange.   For continuous cycler-assisted dialysis:   You will be taught how to set up or program your machine.   When you are ready for bed, put the dialysate bags onto the cycler machine. Put on exactly the number of bags that your caregiver said to use.   Connect your catheter to the machine and turn the cycler machine on.   Overnight, the cycler will do several exchanges. It often does three to five, sometimes more.   Solution that is in your abdomen in the morning will stay during the day. The machine is set to make the daytime solution stronger, if that is needed.   In the morning, you will disconnect from the machine and cap your catheter and go about your day.   Sometimes, an extra exchange is done during the day. This may be needed to remove excess waste or fluid.  IMPORTANT REMINDERS  You will need to follow a very strict schedule. Every step of the dialysis procedure  must be done every day. Sometimes, several times a day. Altogether, this might take an extra 2 hours or more. However, you must stick to the routine. Do not skip a day. Do not skip a procedure.   Some people find it helpful to work with a counselor or social worker in addition to the renal (kidney) nurse. They can help you figure out how to change your daily routine to fit in the dialysis sessions.   You may need to change your diet. Ask your caregiver for advice, or talk with a nutritionist about what you should and should not eat.   You will need to weigh yourself every day and keep track of what your weight is.   You may be taught how to check your blood pressure before every exchange. Your blood pressure reading will help determine what type of solution to use. If your blood pressure is too high, you may need a stronger solution.  RISKS AND COMPLICATIONS  Possible problems vary, depending on the method you use. Your overall health also can have an effect. Problems that could develop because of dialysis include:  Infection. This is the most common problem. It could occur:   In the peritoneum. This is called peritonitis.   Around the catheter.   Weight gain. The dialysate contains a type of sugar known as dextrose. Dextrose has a lot of calories. The body takes in several hundred calories from this sugar each day.   Weakened muscles in the abdomen. This can result from all of the fluid that your body has to hold in the abdomen.   Catheter replacement. Sometimes, a new one has to be put in.   Change in dialysis method. Due to some complications, you may need to change to hemodialysis for a short time and have your dialysis done at a center.   Trouble adjusting to your new lifestyle. In some people, this leads to depression.   Sleep problems.     Dialysis-related amyloidosis. This sometimes occurs after 5 years of dialysis. Protein builds up in the blood. This can cause painful deposits  on bones, joints and tendons (which connect muscle to bone). Or, it can cause hollow spots in bones that make them more likely to break.   Excess fluid. Your body may absorb too much of the fluid that is held in the abdomen. This can lead to heart or lung problems.  SEEK MEDICAL CARE IF:   You have any problems with an exchange.   The area around the catheter becomes red or painful.   The catheter seems loose, or it feels like it is coming out.   A bag of dialysate looks cloudy. Or, the liquid is an unusual color.   Abdominal pain or discomfort.   You feel sick to your stomach (nauseous) or throw up (vomit).   You develop a fever of more than 102 F (38.9 C).  SEEK IMMEDIATE MEDICAL CARE IF:  You develop a fever of more than 102 F (38.9 C). Document Released: 05/22/2009 Document Revised: 07/14/2011 Document Reviewed: 05/22/2009 [employer] Patient Information 2012 [employer]. 

## 2012-02-15 ENCOUNTER — Encounter: Payer: Self-pay | Admitting: Neurosurgery

## 2012-02-16 ENCOUNTER — Ambulatory Visit: Payer: Self-pay | Admitting: Vascular Surgery

## 2012-02-16 ENCOUNTER — Encounter: Payer: Self-pay | Admitting: Neurosurgery

## 2012-02-16 ENCOUNTER — Ambulatory Visit (INDEPENDENT_AMBULATORY_CARE_PROVIDER_SITE_OTHER): Payer: Self-pay | Admitting: Neurosurgery

## 2012-02-16 VITALS — BP 140/83 | HR 92 | Resp 16 | Ht 68.0 in | Wt 137.8 lb

## 2012-02-16 DIAGNOSIS — N186 End stage renal disease: Secondary | ICD-10-CM

## 2012-02-16 NOTE — Progress Notes (Addendum)
Subjective:     Patient ID: Mark Miranda, male   DOB: 1970-10-06, 41 y.o.   MRN: [id number]  HPI: 41 year old male patient of Dr. Darrick Penna who is about 6 weeks status post left brachiocephalic AV fistula. The patient comes in today for a postop check, he states he has some numbness in his forearm but overall is doing well. He is currently using a right upper chest catheter for hemodialysis.   Review of Systems: 12 point review of systems is notable for the difficulties described above otherwise unremarkable     Objective:   Physical Exam: Afebrile, vital signs are stable, left brachiocephalic incision is well healed there is no redness or drainage. The patient does complain of paresthesias in his forearm since the surgery. There is a palpable thrill throughout.     Assessment:     Well-healed left brachiocephalic fistula, 6 weeks out from surgery, may possibly be used in 6 weeks when fully mature.    Plan:     The patient will followup here on when necessary basis, his questions were encouraged and answered. Dr. Darrick Penna saw the patient with me.     Lauree Chandler ANP  Clinic M.D.: [facility]

## 2012-02-27 ENCOUNTER — Encounter (HOSPITAL_COMMUNITY): Payer: Self-pay | Admitting: Pharmacy Technician

## 2012-03-08 ENCOUNTER — Encounter (HOSPITAL_COMMUNITY)
Admission: RE | Admit: 2012-03-08 | Discharge: 2012-03-08 | Disposition: A | Discharge status: Home / Self Care | Payer: Self-pay | Source: Ambulatory Visit | Attending: Surgery | Admitting: Surgery

## 2012-03-08 ENCOUNTER — Encounter (HOSPITAL_COMMUNITY): Payer: Self-pay

## 2012-03-08 LAB — BASIC METABOLIC PANEL
BUN: 39 mg/dL — ABNORMAL HIGH (ref 6–23)
Calcium: 9.1 mg/dL (ref 8.4–10.5)
Creatinine, Ser: 6.8 mg/dL — ABNORMAL HIGH (ref 0.50–1.35)
GFR calc non Af Amer: 9 mL/min — ABNORMAL LOW (ref >90)
Glucose, Bld: 280 mg/dL — ABNORMAL HIGH (ref 70–99)
Sodium: 137 mEq/L (ref 135–145)

## 2012-03-08 LAB — CBC
MCV: 88.7 fL (ref 78.0–100.0)
Platelets: 277 10*3/uL (ref 150–400)
RDW: 14.5 % (ref 11.5–15.5)
WBC: 11.9 10*3/uL — ABNORMAL HIGH (ref 4.0–10.5)

## 2012-03-08 MED ORDER — CHLORHEXIDINE GLUCONATE 4 % EX LIQD: 1.0000 "application " | Freq: Once | CUTANEOUS | Status: DC

## 2012-03-08 NOTE — Progress Notes (Addendum)
Referred abnormal labs and EKG to [facility] for review.

## 2012-03-08 NOTE — Pre-Procedure Instructions (Signed)
[address]  03/08/2012   Your procedure is scheduled on:  Tuesday March 13, 2012  Report to [facility] at 11:00 AM.  Call this number if you have problems the morning of surgery: [phone]   Remember:   Do not eat food or drink:After Midnight.      Take these medicines the morning of surgery with A SIP OF WATER: amlodipine, gabapentin,    Do not wear jewelry, make-up or nail polish.  Do not wear lotions, powders, or perfumes. You may wear deodorant.  Do not shave 48 hours prior to surgery. Men may shave face and neck.  Do not bring valuables to the hospital.  Contacts, dentures or bridgework may not be worn into surgery.  Leave suitcase in the car. After surgery it may be brought to your room.  For patients admitted to the hospital, checkout time is 11:00 AM the day of discharge.   Patients discharged the day of surgery will not be allowed to drive home.  Name and phone number of your driver:  Family / friend  Special Instructions: CHG Shower Use Special Wash: 1/2 bottle night before surgery and 1/2 bottle morning of surgery.   Please read over the following fact sheets that you were given: Pain Booklet, Coughing and Deep Breathing, MRSA Information and Surgical Site Infection Prevention

## 2012-03-08 NOTE — Progress Notes (Signed)
Discussed CXR from 01/06/12 with Revonda Standard, request reevalution of CXR due to impression of opacities/infection.  CXR repeat on 03/08/12

## 2012-03-09 NOTE — Consult Note (Signed)
Anesthesia chart review: Patient is a 41 year old male scheduled for laparoscopic exploration and repositioning versus possible replacement of peritoneal dialysis catheter, omentopexy by Dr. Michaell Cowing on 03/13/12.  His history includes chronic kidney disease currently on hemodialysis, smoking, diabetes mellitus on insulin, hypertension, hypercholesterolemia.  In June 2013 he underwent creation of a left upper arm arteriovenous fistula and insertion of a peritoneal dialysis catheter.  EKG on 01/06/12 showed SR, probably LAE, lateral T wave abnormality consider ischemia.  He has had lateral T wave abnormality on EKGs dating back to at least April 2012.  He has undergone at least three operative procedures since.    Echo on 09/29/10 showed: LV cavity size is normal. Wall thickness increased in a pattern of moderate LVH, systolic function was normal with ejection fraction 55-60%, wall motion normal with no regional wall motion abnormalities. No significant valvular abnormalities.  Labs noted.  He will get an ISTAT on the day of surgery.  Can treat hyperglycemia at that time if indicated.  CXR report from 03/08/12 showed: A dual lumen catheter is in place via a right subclavian  approach with the superior tip ending in the region of the superior cavoatrial junction and the distal tip located greater than two vertebral bodies below the level of the carina suggesting placement in the right atrium. Overall positioning is unchanged in comparison with prior exam. The cardiomediastinal contour is within normal limits. The lung  fields appear clear with no signs of focal infiltrate or congestive failure. No pleural fluid or significant peribronchial cuffing is seen and bony structures appear intact.   If labs are reasonable and no acute cardiopulmonary symptoms then anticipate he can proceed as planned.  Shonna Chock, PA-C

## 2012-03-12 MED ORDER — DEXTROSE 5 % IV SOLN
3.0000 g | INTRAVENOUS | Status: AC
  Administered 2012-03-13: 3 g via INTRAVENOUS
  Filled 2012-03-12: qty 3000

## 2012-03-13 ENCOUNTER — Encounter (HOSPITAL_COMMUNITY)
Admission: RE | Disposition: A | Discharge status: Home / Self Care | Payer: Self-pay | Source: Ambulatory Visit | Attending: Surgery

## 2012-03-13 ENCOUNTER — Encounter (HOSPITAL_COMMUNITY): Payer: Self-pay | Admitting: *Deleted

## 2012-03-13 ENCOUNTER — Encounter (HOSPITAL_COMMUNITY): Payer: Self-pay | Admitting: Vascular Surgery

## 2012-03-13 ENCOUNTER — Ambulatory Visit (HOSPITAL_COMMUNITY)
Admission: RE | Admit: 2012-03-13 | Discharge: 2012-03-13 | Disposition: A | Discharge status: Home / Self Care | Payer: Self-pay | Source: Ambulatory Visit | Attending: Surgery | Admitting: Surgery

## 2012-03-13 ENCOUNTER — Ambulatory Visit (HOSPITAL_COMMUNITY): Payer: Self-pay | Admitting: Vascular Surgery

## 2012-03-13 DIAGNOSIS — T82898A Other specified complication of vascular prosthetic devices, implants and grafts, initial encounter: Secondary | ICD-10-CM

## 2012-03-13 DIAGNOSIS — K660 Peritoneal adhesions (postprocedural) (postinfection): Secondary | ICD-10-CM | POA: Insufficient documentation

## 2012-03-13 DIAGNOSIS — Z01810 Encounter for preprocedural cardiovascular examination: Secondary | ICD-10-CM | POA: Insufficient documentation

## 2012-03-13 DIAGNOSIS — T85691A Other mechanical complication of intraperitoneal dialysis catheter, initial encounter: Secondary | ICD-10-CM | POA: Insufficient documentation

## 2012-03-13 DIAGNOSIS — N186 End stage renal disease: Secondary | ICD-10-CM | POA: Diagnosis present

## 2012-03-13 DIAGNOSIS — I120 Hypertensive chronic kidney disease with stage 5 chronic kidney disease or end stage renal disease: Secondary | ICD-10-CM | POA: Insufficient documentation

## 2012-03-13 DIAGNOSIS — E119 Type 2 diabetes mellitus without complications: Secondary | ICD-10-CM | POA: Insufficient documentation

## 2012-03-13 DIAGNOSIS — Z01818 Encounter for other preprocedural examination: Secondary | ICD-10-CM | POA: Insufficient documentation

## 2012-03-13 DIAGNOSIS — Z992 Dependence on renal dialysis: Secondary | ICD-10-CM | POA: Insufficient documentation

## 2012-03-13 DIAGNOSIS — Y831 Surgical operation with implant of artificial internal device as the cause of abnormal reaction of the patient, or of later complication, without mention of misadventure at the time of the procedure: Secondary | ICD-10-CM | POA: Insufficient documentation

## 2012-03-13 DIAGNOSIS — T85611A Breakdown (mechanical) of intraperitoneal dialysis catheter, initial encounter: Secondary | ICD-10-CM | POA: Diagnosis present

## 2012-03-13 DIAGNOSIS — Z794 Long term (current) use of insulin: Secondary | ICD-10-CM | POA: Insufficient documentation

## 2012-03-13 HISTORY — PX: LAPAROSCOPY: SHX197

## 2012-03-13 LAB — POCT I-STAT 4, (NA,K, GLUC, HGB,HCT): Potassium: 4.1 mEq/L (ref 3.5–5.1)

## 2012-03-13 SURGERY — LAPAROSCOPY, DIAGNOSTIC
Anesthesia: General | Site: Abdomen | Wound class: Clean

## 2012-03-13 MED ORDER — SODIUM CHLORIDE 0.9 % IV SOLN
INTRAVENOUS | Status: DC | PRN
  Administered 2012-03-13 (×3): via INTRAVENOUS

## 2012-03-13 MED ORDER — DEXTROSE 5 % IV SOLN
INTRAVENOUS | Status: DC | PRN
  Administered 2012-03-13: 13:00:00 via INTRAVENOUS

## 2012-03-13 MED ORDER — HYDROMORPHONE HCL PF 1 MG/ML IJ SOLN
INTRAMUSCULAR | Status: AC
  Filled 2012-03-13: qty 1

## 2012-03-13 MED ORDER — ACETAMINOPHEN 10 MG/ML IV SOLN
1000.0000 mg | Freq: Four times a day (QID) | INTRAVENOUS | Status: DC
  Administered 2012-03-13: 1000 mg via INTRAVENOUS
  Filled 2012-03-13: qty 100

## 2012-03-13 MED ORDER — LIDOCAINE HCL (CARDIAC) 20 MG/ML IV SOLN
INTRAVENOUS | Status: DC | PRN
  Administered 2012-03-13: 100 mg via INTRAVENOUS

## 2012-03-13 MED ORDER — OXYCODONE HCL 5 MG PO TABS
5.0000 mg | ORAL_TABLET | ORAL | Status: DC | PRN
  Administered 2012-03-13: 10 mg via ORAL

## 2012-03-13 MED ORDER — HYDROMORPHONE HCL PF 1 MG/ML IJ SOLN
0.2500 mg | INTRAMUSCULAR | Status: DC | PRN
  Administered 2012-03-13 (×4): 0.5 mg via INTRAVENOUS

## 2012-03-13 MED ORDER — BUPIVACAINE-EPINEPHRINE PF 0.25-1:200000 % IJ SOLN
INTRAMUSCULAR | Status: AC
  Filled 2012-03-13: qty 30

## 2012-03-13 MED ORDER — BUPIVACAINE-EPINEPHRINE 0.25% -1:200000 IJ SOLN
INTRAMUSCULAR | Status: DC | PRN
  Administered 2012-03-13: 10 mL

## 2012-03-13 MED ORDER — GLYCOPYRROLATE 0.2 MG/ML IJ SOLN
INTRAMUSCULAR | Status: DC | PRN
  Administered 2012-03-13: 0.6 mg via INTRAVENOUS

## 2012-03-13 MED ORDER — SODIUM CHLORIDE 0.9 % IV SOLN
INTRAVENOUS | Status: DC
  Administered 2012-03-13: 35 mL/h via INTRAVENOUS

## 2012-03-13 MED ORDER — OXYCODONE HCL 5 MG PO TABS
ORAL_TABLET | ORAL | Status: AC
  Filled 2012-03-13: qty 2

## 2012-03-13 MED ORDER — OXYCODONE HCL 5 MG PO TABS: 5.0000 mg | ORAL_TABLET | Freq: Four times a day (QID) | ORAL | Status: DC | PRN

## 2012-03-13 MED ORDER — ONDANSETRON HCL 4 MG/2ML IJ SOLN
INTRAMUSCULAR | Status: DC | PRN
  Administered 2012-03-13: 4 mg via INTRAVENOUS

## 2012-03-13 MED ORDER — ROCURONIUM BROMIDE 100 MG/10ML IV SOLN
INTRAVENOUS | Status: DC | PRN
  Administered 2012-03-13: 40 mg via INTRAVENOUS
  Administered 2012-03-13: 10 mg via INTRAVENOUS

## 2012-03-13 MED ORDER — MIDAZOLAM HCL 5 MG/5ML IJ SOLN
INTRAMUSCULAR | Status: DC | PRN
  Administered 2012-03-13 (×2): 1 mg via INTRAVENOUS

## 2012-03-13 MED ORDER — ONDANSETRON HCL 4 MG/2ML IJ SOLN: 4.0000 mg | Freq: Once | INTRAMUSCULAR | Status: DC | PRN

## 2012-03-13 MED ORDER — PHENYLEPHRINE HCL 10 MG/ML IJ SOLN
INTRAMUSCULAR | Status: DC | PRN
  Administered 2012-03-13 (×6): 40 ug via INTRAVENOUS
  Administered 2012-03-13 (×2): 80 ug via INTRAVENOUS

## 2012-03-13 MED ORDER — SODIUM CHLORIDE 0.9 % IR SOLN
Status: DC | PRN
  Administered 2012-03-13 (×3): 1000 mL

## 2012-03-13 MED ORDER — SODIUM CHLORIDE 0.9 % IR SOLN
Status: DC | PRN
  Administered 2012-03-13: 15:00:00

## 2012-03-13 MED ORDER — 0.9 % SODIUM CHLORIDE (POUR BTL) OPTIME
TOPICAL | Status: DC | PRN
  Administered 2012-03-13: 2000 mL

## 2012-03-13 MED ORDER — PROPOFOL 10 MG/ML IV EMUL
INTRAVENOUS | Status: DC | PRN
  Administered 2012-03-13: 130 mg via INTRAVENOUS

## 2012-03-13 MED ORDER — FENTANYL CITRATE 0.05 MG/ML IJ SOLN
INTRAMUSCULAR | Status: DC | PRN
  Administered 2012-03-13 (×5): 50 ug via INTRAVENOUS

## 2012-03-13 MED ORDER — NEOSTIGMINE METHYLSULFATE 1 MG/ML IJ SOLN
INTRAMUSCULAR | Status: DC | PRN
  Administered 2012-03-13: 4 mg via INTRAVENOUS

## 2012-03-13 SURGICAL SUPPLY — 61 items
ADAPTER CATH SAFE LCK II CO PK (MISCELLANEOUS) IMPLANT
ADAPTER SAFE LOCK II CO PACK (MISCELLANEOUS)
APPLIER CLIP ROT 10 11.4 M/L (STAPLE)
BAG DECANTER FOR FLEXI CONT (MISCELLANEOUS) IMPLANT
BLADE SURG ROTATE 9660 (MISCELLANEOUS) IMPLANT
CANISTER SUCTION 2500CC (MISCELLANEOUS) ×4 IMPLANT
CATH MONCRIEF POPOVICH (CATHETERS) IMPLANT
CHLORAPREP W/TINT 26ML (MISCELLANEOUS) ×2 IMPLANT
CLIP APPLIE ROT 10 11.4 M/L (STAPLE) IMPLANT
CLOTH BEACON ORANGE TIMEOUT ST (SAFETY) ×2 IMPLANT
COIL SWAN NECK LT (MISCELLANEOUS) IMPLANT
COIL SWAN NECK RT (MISCELLANEOUS) IMPLANT
CONT SPEC 4OZ CLIKSEAL STRL BL (MISCELLANEOUS) ×2 IMPLANT
COVER SURGICAL LIGHT HANDLE (MISCELLANEOUS) ×2 IMPLANT
DECANTER SPIKE VIAL GLASS SM (MISCELLANEOUS) IMPLANT
DISSECTOR BLUNT TIP ENDO 5MM (MISCELLANEOUS) IMPLANT
DRAPE WARM FLUID 44X44 (DRAPE) ×2 IMPLANT
DRSG OPSITE 4X5.5 SM (GAUZE/BANDAGES/DRESSINGS) IMPLANT
DRSG PAD ABDOMINAL 8X10 ST (GAUZE/BANDAGES/DRESSINGS) IMPLANT
DRSG TEGADERM 4X4.75 (GAUZE/BANDAGES/DRESSINGS) ×2 IMPLANT
ELECT CAUTERY BLADE 6.4 (BLADE) ×2 IMPLANT
ELECT REM PT RETURN 9FT ADLT (ELECTROSURGICAL) ×2
ELECTRODE REM PT RTRN 9FT ADLT (ELECTROSURGICAL) ×1 IMPLANT
ENDOLOOP SUT PDS II  0 18 (SUTURE)
ENDOLOOP SUT PDS II 0 18 (SUTURE) IMPLANT
GAUZE SPONGE 2X2 8PLY STRL LF (GAUZE/BANDAGES/DRESSINGS) ×1 IMPLANT
GLOVE BIO SURGEON STRL SZ7 (GLOVE) ×2 IMPLANT
GLOVE BIOGEL PI IND STRL 7.0 (GLOVE) ×1 IMPLANT
GLOVE BIOGEL PI IND STRL 8 (GLOVE) ×1 IMPLANT
GLOVE BIOGEL PI INDICATOR 7.0 (GLOVE) ×1
GLOVE BIOGEL PI INDICATOR 8 (GLOVE) ×1
GLOVE ECLIPSE 8.0 STRL XLNG CF (GLOVE) ×2 IMPLANT
GOWN PREVENTION PLUS XLARGE (GOWN DISPOSABLE) ×2 IMPLANT
GOWN STRL NON-REIN LRG LVL3 (GOWN DISPOSABLE) ×4 IMPLANT
KIT BASIN OR (CUSTOM PROCEDURE TRAY) ×2 IMPLANT
KIT ROOM TURNOVER OR (KITS) ×2 IMPLANT
NEEDLE 22X1 1/2 (OR ONLY) (NEEDLE) ×2 IMPLANT
NS IRRIG 1000ML POUR BTL (IV SOLUTION) ×2 IMPLANT
PAD ARMBOARD 7.5X6 YLW CONV (MISCELLANEOUS) ×4 IMPLANT
PENCIL BUTTON HOLSTER BLD 10FT (ELECTRODE) ×2 IMPLANT
SCALPEL HARMONIC ACE (MISCELLANEOUS) IMPLANT
SCISSORS LAP 5X35 DISP (ENDOMECHANICALS) ×2 IMPLANT
SET EXTENSION TUBING 8  CATH (SET/KITS/TRAYS/PACK) IMPLANT
SET IRRIG TUBING LAPAROSCOPIC (IRRIGATION / IRRIGATOR) ×2 IMPLANT
SLEEVE ENDOPATH XCEL 5M (ENDOMECHANICALS) ×4 IMPLANT
SLEEVE Z-THREAD 5X100MM (TROCAR) ×2 IMPLANT
SPONGE GAUZE 2X2 STER 10/PKG (GAUZE/BANDAGES/DRESSINGS) ×1
SUT MNCRL AB 4-0 PS2 18 (SUTURE) ×4 IMPLANT
SUT PROLENE 2 0 CT2 30 (SUTURE) ×2 IMPLANT
SUT VIC AB 2-0 SH 27 (SUTURE) ×1
SUT VIC AB 2-0 SH 27X BRD (SUTURE) ×1 IMPLANT
SUT VICRYL 0 TIES 12 18 (SUTURE) IMPLANT
TOWEL OR 17X24 6PK STRL BLUE (TOWEL DISPOSABLE) ×2 IMPLANT
TOWEL OR 17X26 10 PK STRL BLUE (TOWEL DISPOSABLE) ×2 IMPLANT
TRAY FOLEY CATH 14FRSI W/METER (CATHETERS) IMPLANT
TRAY LAPAROSCOPIC (CUSTOM PROCEDURE TRAY) ×2 IMPLANT
TROCAR FALLER TUNNELING (TROCAR) ×2 IMPLANT
TROCAR XCEL NON-BLD 11X100MML (ENDOMECHANICALS) IMPLANT
TROCAR XCEL NON-BLD 5MMX100MML (ENDOMECHANICALS) ×2 IMPLANT
TROCAR Z-THREAD FIOS 5X100MM (TROCAR) ×2 IMPLANT
WATER STERILE IRR 1000ML POUR (IV SOLUTION) IMPLANT

## 2012-03-13 NOTE — Transfer of Care (Signed)
Immediate Anesthesia Transfer of Care Note  Patient: Mark Miranda  Procedure(s) Performed: Procedure(s) (LRB): LAPAROSCOPY DIAGNOSTIC (N/A) LAPAROSCOPIC REPOSITIONING CAPD CATHETER (N/A) LAPAROSCOPIC LYSIS OF ADHESIONS (N/A)  Patient Location: PACU  Anesthesia Type: General  Level of Consciousness: awake, alert  and patient cooperative  Airway & Oxygen Therapy: Patient Spontanous Breathing and Patient connected to nasal cannula oxygen  Post-op Assessment: Report given to PACU RN, Post -op Vital signs reviewed and stable and Patient moving all extremities  Post vital signs: Reviewed and stable  Complications: No apparent anesthesia complications

## 2012-03-13 NOTE — Progress Notes (Signed)
Dr. Michelle Piper notified of patient drinking 6-8 oz. "Tang" at 0930.

## 2012-03-13 NOTE — Anesthesia Procedure Notes (Signed)
Procedure Name: Intubation Date/Time: 03/13/2012 12:46 PM Performed by: Marni Griffon Pre-anesthesia Checklist: Patient identified, Emergency Drugs available, Suction available and Patient being monitored Patient Re-evaluated:Patient Re-evaluated prior to inductionOxygen Delivery Method: Circle system utilized Preoxygenation: Pre-oxygenation with 100% oxygen Intubation Type: IV induction Ventilation: Mask ventilation without difficulty Laryngoscope Size: Mac and 3 Grade View: Grade I Tube type: Oral Number of attempts: 1 Airway Equipment and Method: Stylet Placement Confirmation: ETT inserted through vocal cords under direct vision,  breath sounds checked- equal and bilateral and positive ETCO2 Secured at: 23 (cm at lips) cm Tube secured with: Tape Dental Injury: Teeth and Oropharynx as per pre-operative assessment

## 2012-03-13 NOTE — Anesthesia Preprocedure Evaluation (Addendum)
Anesthesia Evaluation  Patient identified by MRN, date of birth, ID band Patient awake    Reviewed: Allergy & Precautions, H&P , NPO status , Patient's Chart, lab work & pertinent test results, reviewed documented beta blocker date and time   Airway Mallampati: I TM Distance: >3 FB Neck ROM: Full    Dental  (+) Missing, Teeth Intact and Dental Advisory Given   Pulmonary          Cardiovascular     Neuro/Psych    GI/Hepatic   Endo/Other  Well Controlled, Type 1, Insulin Dependent  Renal/GU CRF and DialysisRenal disease     Musculoskeletal   Abdominal   Peds  Hematology   Anesthesia Other Findings   Reproductive/Obstetrics                          Anesthesia Physical Anesthesia Plan  ASA: III  Anesthesia Plan: General   Post-op Pain Management:    Induction: Intravenous  Airway Management Planned: Oral ETT  Additional Equipment:   Intra-op Plan:   Post-operative Plan: Extubation in OR  Informed Consent: I have reviewed the patients History and Physical, chart, labs and discussed the procedure including the risks, benefits and alternatives for the proposed anesthesia with the patient or authorized representative who has indicated his/her understanding and acceptance.     Plan Discussed with: CRNA and Surgeon  Anesthesia Plan Comments:         Anesthesia Quick Evaluation

## 2012-03-13 NOTE — Anesthesia Postprocedure Evaluation (Signed)
Anesthesia Post Note  Patient: Mark Miranda  Procedure(s) Performed: Procedure(s) (LRB): LAPAROSCOPY DIAGNOSTIC (N/A) LAPAROSCOPIC REPOSITIONING CAPD CATHETER (N/A) LAPAROSCOPIC LYSIS OF ADHESIONS (N/A)  Anesthesia type: general  Patient location: PACU  Post pain: Pain level controlled  Post assessment: Patient's Cardiovascular Status Stable  Last Vitals:  Filed Vitals:   03/13/12 1613  BP: 157/84  Pulse: 76  Temp:   Resp: 15    Post vital signs: Reviewed and stable  Level of consciousness: sedated  Complications: No apparent anesthesia complications

## 2012-03-13 NOTE — Op Note (Signed)
03/13/2012  3:02 PM  PATIENT:  Mark Miranda  41 y.o. male  Patient Care Team: Jaclyn Shaggy, MD as PCP - General (Family Medicine) Hartley Barefoot. Allena Katz, MD (Nephrology)  PRE-OPERATIVE DIAGNOSIS:  malfunctioning CAPD cath  POST-OPERATIVE DIAGNOSIS:  malfunctioning CAPD cath  PROCEDURE:  Procedure(s):  LAPAROSCOPIC LYSIS OF ADHESIONS LAPAROSCOPIC REPOSITIONING CAPD CATHETER Laparoscopic omentopexy  Laparoscopic mesoappendicopexy   SURGEON:  Surgeon(s): Ardeth Sportsman, MD  ASSISTANT: none   ANESTHESIA:   local and general  EBL:  Total I/O In: 550 [I.V.:550] Out: 25 [Blood:25]  Delay start of Pharmacological VTE agent (>24hrs) due to surgical blood loss or risk of bleeding:  no  DRAINS: CAPD dialysis catheter's curled tip rest in the left pelvic cul-de-sac & exits the right suprapubic region   SPECIMEN:  Source of Specimen:  hematoma & coagulum within the peritoneal dialysis catheter  DISPOSITION OF SPECIMEN:  microbiology  COUNTS:  YES  PLAN OF CARE: Discharge to home after PACU  PATIENT DISPOSITION:  PACU - hemodynamically stable.  INDICATION: Pleasant young male with diabetes and hypertension progressing to renal failure.  He's now dialysis dependent.  He had had a Moncrieff-Popovich peritoneal dialysis catheter with the tip in the subcutaneous tissues placed by Dr. Zachery Dakins over a year ago.  It was externalized for my partner Dr. Claud Kelp a few months ago.  Initially CAPD catheter flushed adequately and then began to be clogged.  He was sent to me for consideration of CAPD catheter salvage versus replacement:  The anatomy & physiology of peritoneum was discussed.  Natural history risks without surgery of worsening renal failure was discussed.   I feel the risks of no intervention will lead to serious problems that outweigh the operative risks; therefore, I recommended placement of a peritoneal dialysis catheter.  I explained laparoscopic techniques with possible  need for an open approach.    Risks such as bleeding, infection, abscess, injury to other organs, catheter occlusion or malpositioning, reoperation to remove/reposition the catheter, heart attack, death, and other risks were discussed.   I noted a good likelihood this will help address the problem.  Possibility that this will not be enough to compensate for the renal failure & need for further treatment such as hemodialysis was explained.  Goals of post-operative recovery were discussed as well.  We will work to minimize complications.   The patient is/will be getting training on catheter use by dialysis nursing before and after surgery.  I stressed the importance of meticulous care & sterile technique to prevent catheter problems.  Questions were answered.  The patient expresses understanding & wishes to proceed with surgery.   OR FINDINGS: The shepherd's crook curl of the peritoneal dialysis catheter rested in the right pelvic cul-de-sac.  He had dense but filmy adhesions primarily of appendix/mesoappendix and some small bowel serosa around it.  Omentum was adherent to the periumbilical catheter entry point.  It was not adherent down the pelvis  He had a moderate volume of coagulum and clot within the curl catheter.  He had a moderate kinking in the subcutaneous tissues of the catheter is well.  The catheter now rests in the left side of the pelvic cul-de-sac.  Flushes and aspirates well  DESCRIPTION: Informed consent was confirmed.  The patient underwent general anaesthesia without difficulty.  The patient was positioned appropriately.  VTE prevention in place.  The patient's abdomen was clipped, prepped, & draped in a sterile fashion.  Surgical timeout confirmed our plan.  The patient was positioned  in reverse Trendelenburg.  Abdominal entry was gained using optical entry technique in the left upper abdomen.  Entry was clean.  I induced carbon dioxide insufflation.  Camera inspection revealed no  injury.  Extra ports were carefully placed under direct laparoscopic visualization Within the left upper quadrant.  I could see a tongue of central omentum going up to the umbilicus and the catheter.  I freed that off.  I could see the catheter going down the pelvis with small bowel around it.  I reduced small bowel out of the pelvis.  There was some serosal inflammation of some ileal loops.  I mobilized the terminal ileal mesentery out of the pelvis to help keep it away from the peritoneal dialysis catheter.  The catheter was curled around near the mesoappendix.  It had moderate fibrinous adhesions to it only.  I carefully freed the catheter out.  I mobilized the appendix out of the pelvis using permanent sharp dissection.  I did debridement of the fibrinous adhesions out of the pelvis and irrigated well and ensured hemostasis.   I placed a 2-0 vicryl stitch to the mesoappendix to tack it at the right lower quadrant lateral wall to help keep it from flopping back into the pelvis.  I ran the small bowel.  He and some mild intraloop adhesions but nothing too severe.  I left them alone.   No evidence of any pus or purulence or abscess.  The catheter curl was filled with blood and coagulum.  Eventually was able to milk it out with flushings.  I sent some of that off for culture.    However, the CAPD catheter still did not flush nor aspirate easily.  I then decided to explore the abdominal wall.  I opened up the exit point of the catheter at the skin using a scalpel.  I did blunt dissection to help straighten out the catheter.  It seemed to be kinked.  I did a cutdown incision more near white out the subcutaneous cuff was.  But eventually I freed that off and is able to straighten out and directed down towards the right SQ  suprapubic area.  With that I the catheter spontaneously began to drain saline of the pelvis.  It aspirated and flushed well.  I placed the catheter through a right suprapubic exit point in the  paramedian region using a spike tunneler to have a minimal hole around the catheter.  I assured hemostasis in the subcutaneous tissues.  I returned to laparoscopic exploration.  The left side of the pelvic cul-de-sac at the left rectal mesentery was was free of adhesions and no inflammation.  I placed the CAPD shepard's crook down there.  I pexied the catheter using a 2-0 Prolene stitch on the peritoneum near the dome of the bladder and tied that down.  I looped the Prolene around the catheter and gently tied that down to act as a little sling to help hold the catheter into the pelvis.  Catheter flushed and aspirated easily.  I proceeded with omentopexy and used 2-0 Vicryl to pexy the right greater omentum up towards the right subcostal ridge.  I did a similar stitch on the left side.  Thus the greater omentum was all supraumbilical.  It did copious irrigation.  Hemostasis excellent.  I did a final flush of dilute heparinized Fluid in the catheter.I evacuated carbon dioxide & removed the ports.  I closed the skin using 4 Monocryl stitch.  Sterile dressings were applied.  Patient is  extubated.  I anticipate him leaving later today.  Work note was written for his wife per his request.

## 2012-03-13 NOTE — H&P (Signed)
Mark Miranda  [birth year] [id number]  CARE TEAM:  PCP: Jaclyn Shaggy, MD  Outpatient Care Team: Patient Care Team: Jaclyn Shaggy, MD as PCP - General (Family Medicine) Hartley Barefoot. Allena Katz, MD (Nephrology)  Inpatient Treatment Team: Treatment Team: Attending Provider: Ardeth Sportsman, MD   This patient is a 41 y.o.male who presents today for surgical evaluation at the request of Dr. Allena Katz.   Reason for evaluation: Nonfunctioning peritoneal dialysis catheter.  Pleasant male with progressive chronic kidney disease. He had a Moncrief-Popovich catheter placed & tip in the SQ by Dr. Zachery Dakins last year. Dr. Zachery Dakins has retired. The patient's kidney disease progressed to end-stage. He was admitted. It was externalized urgently by Dr. Derrell Lolling last month. Initially flushed well. Quickly became nonfunctional. Is on hemodialysis now. Is very interested in trying a peroneal dialysis catheter again. Therefore, the patient was sent to me to see if he can be salvaged/replaced.  Patient denies any fevers or chills. Relatively active. Had some constipation but that is improved. No recent skin problems  I have re-reviewed the the patient's records, history, medications, and allergies.  No new events.     Patient Active Problem List  Diagnosis  . HTN (hypertension)  . 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 Kidney disease or stones  . End stage renal disease  . Leukocytosis  . Hypoglycemia  . Uremia  . Peritoneal dialysis catheter dysfunction    Past Medical History  Diagnosis Date  . Diabetes mellitus   . Renal disorder   . Hypertension   . Hypercholesterolemia     Past Surgical History  Procedure Date  . Peritoneal catheter insertion   . Capd insertion 01/09/2012    Procedure: CONTINUOUS AMBULATORY PERITONEAL DIALYSIS  (CAPD) CATHETER INSERTION;  Surgeon: Ernestene Mention, MD;  Location: [facility] OR;  Service: General;  Laterality: N/A;  Exteriorization PD Cath  .  Insertion of dialysis catheter 01/13/2012    Procedure: INSERTION OF DIALYSIS CATHETER;  Surgeon: Larina Earthly, MD;  Location: [facility] OR;  Service: Vascular;  Laterality: Right;  Internal Jugular  . Av fistula placement 01/10/2012    Procedure: ARTERIOVENOUS (AV) FISTULA CREATION;  Surgeon: Sherren Kerns, MD;  Location: [facility] OR;  Service: Vascular;  Laterality: Left;  Left Brachial Cephalic Arteriovenous Fistula    History   Social History  . Marital Status: Married    Spouse Name: N/A    Number of Children: N/A  . Years of Education: N/A   Occupational History  . Not on file.   Social History Main Topics  . Smoking status: Current Everyday Smoker -- 1.0 packs/day    Types: Cigarettes  . Smokeless tobacco: Never Used  . Alcohol Use: No  . Drug Use: No  . Sexually Active: Not on file   Other Topics Concern  . Not on file   Social History Narrative  . No narrative on file    Family History  Problem Relation Age of Onset  . Diabetes Mother     Current Facility-Administered Medications  Medication Dose Route Frequency Provider Last Rate Last Dose  . ceFAZolin (ANCEF) 3 g in dextrose 5 % 50 mL IVPB  3 g Intravenous 60 min Pre-Op Ardeth Sportsman, MD         No Known Allergies  ROS: Constitutional:  No fevers, chills, sweats.  Weight stable Eyes:  No vision changes, No discharge HENT:  No sore throats, nasal drainage Lymph: No neck swelling, No bruising easily Pulmonary:  No cough, productive sputum CV: No orthopnea, PND.  No exertional chest/neck/shoulder/arm pain.  GI: No personal nor family history of GI/colon cancer, inflammatory bowel disease, irritable bowel syndrome, allergy such as Celiac Sprue, dietary/dairy problems, colitis, ulcers nor gastritis.    No recent sick contacts/gastroenteritis.  No travel outside the country.  No changes in diet.  Renal: No UTIs, No hematuria Genital:  No drainage, bleeding, masses Musculoskeletal: No severe joint pain.  Good ROM  major joints Skin:  No sores or lesions.  No rashes Heme/Lymph:  No easy bleeding.  No swollen lymph nodes  BP 156/91  Pulse 75  Temp 98.6 F (37 C) (Oral)  Resp 20  SpO2 99%  Physical Exam: General: Pt awake/alert/oriented x4 in no major acute distress Eyes: PERRL, normal EOM. Sclera nonicteric Neuro: CN II-XII intact w/o focal sensory/motor deficits. Lymph: No head/neck/groin lymphadenopathy Psych:  No delerium/psychosis/paranoia HENT: Normocephalic, Mucus membranes moist.  No thrush Neck: Supple, No tracheal deviation Chest: No pain.  Good respiratory excursion. CV:  Pulses intact.  Regular rhythm Abdomen: Soft, Nondistended.  Nontender.  No incarcerated hernias.  RLQ CAPD catheter exit site Ext:  SCDs BLE.  No significant edema.  No cyanosis Skin: No petechiae / purpurae  Results:   Labs: Results for orders placed during the hospital encounter of 03/13/12 (from the past 48 hour(s))  POCT I-STAT 4, (NA,K, GLUC, HGB,HCT)     Status: Abnormal   Collection Time   03/13/12 11:19 AM      Component Value Range Comment   Sodium 140  135 - 145 mEq/L    Potassium 4.1  3.5 - 5.1 mEq/L    Glucose, Bld 162 (*) 70 - 99 mg/dL    HCT 16.1  09.6 - 04.5 %    Hemoglobin 13.6  13.0 - 17.0 g/dL     Imaging / Studies: Dg Chest 2 View  03/08/2012  *RADIOLOGY REPORT*  Clinical Data: Preoperative evaluation for laproscopic repositioning of CAPD catheter.  Hypertension and diabetes  CHEST - 2 VIEW  Comparison: 01/13/2012  Findings: A dual lumen catheter is in place via a right subclavian approach with the superior tip ending in the region of the superior cavoatrial junction and the distal tip located greater than two vertebral bodies below the level of the carina suggesting placement in the right atrium.  Overall positioning is unchanged in comparison with prior exam.  The cardiomediastinal contour is within normal limits.  The lung fields appear clear with no signs of focal infiltrate or congestive  failure.  No pleural fluid or significant peribronchial cuffing is seen and bony structures appear intact.  IMPRESSION: Dual lumen vascular catheter tip positions as noted above.  Stable cardiopulmonary appearance.  Original Report Authenticated By: Bertha Stakes, M.D.    Medications / Allergies: per chart  Antibiotics: Anti-infectives     Start     Dose/Rate Route Frequency Ordered Stop   03/12/12 1420   ceFAZolin (ANCEF) 3 g in dextrose 5 % 50 mL IVPB        3 g 160 mL/hr over 30 Minutes Intravenous 60 min pre-op 03/12/12 1420            Assessment  Mark Miranda  41 y.o. male  Day of Surgery  Procedure(s): LAPAROSCOPY DIAGNOSTIC LAPAROSCOPIC REPOSITIONING CAPD CATHETER  Problem List:  Principal Problem:  *Peritoneal dialysis catheter dysfunction Active Problems:  End stage renal disease  Nonfunctioning CAPD catheter  Plan:  Dx lap with repositioning/omextopexy, possible replacement:  The anatomy &  physiology of peritoneum was discussed.  Natural history risks without surgery of worsening renal failure was discussed.   I feel the risks of no intervention will lead to serious problems that outweigh the operative risks; therefore, I recommended laparoscopic exploration with repositioning/replacement of a peritoneal dialysis catheter.  I explained laparoscopic techniques with possible need for an open approach.    Risks such as bleeding, infection, abscess, injury to other organs, catheter occlusion or malpositioning, reoperation to remove/reposition the catheter, heart attack, death, and other risks were discussed.   I noted a good likelihood this will help address the problem.  Possibility that this will not be enough to compensate for the renal failure & need for further treatment such as hemodialysis was explained.  Goals of post-operative recovery were discussed as well.  We will work to minimize complications.   I stressed the importance of meticulous care &  sterile technique to prevent catheter problems.  Questions were answered.  The patient expresses understanding & wishes to proceed with surgery.   Ardeth Sportsman, M.D., F.A.C.S. Gastrointestinal and Minimally Invasive Surgery [facility] [address], [address] [place], [place] [address] [phone] Main / Paging [phone] Voice Mail   03/13/2012

## 2012-03-13 NOTE — Preoperative (Signed)
Beta Blockers   Reason not to administer Beta Blockers:Not Applicable 

## 2012-03-14 ENCOUNTER — Telehealth (INDEPENDENT_AMBULATORY_CARE_PROVIDER_SITE_OTHER): Payer: Self-pay | Admitting: General Surgery

## 2012-03-14 ENCOUNTER — Encounter (HOSPITAL_COMMUNITY): Payer: Self-pay | Admitting: Surgery

## 2012-03-14 ENCOUNTER — Telehealth (INDEPENDENT_AMBULATORY_CARE_PROVIDER_SITE_OTHER): Payer: Self-pay

## 2012-03-14 LAB — GLUCOSE, CAPILLARY: Glucose-Capillary: 183 mg/dL — ABNORMAL HIGH (ref 70–99)

## 2012-03-14 NOTE — Telephone Encounter (Signed)
Patient called in stating he was in pain from surgery yesterday. [place] Spaniel it was difficult to move. Patient confirmed he has been able to use the bathroom, but it takes time to get there because he is so sore so it is hard to move. I advised the patient based on the procedures performed it is normal to be in pain even with the pain medication. Patient asked if he could take 3 pain pills at a time instead of 2. I advised not to because it can cause his breathing to become suppressed. Advised the patient that if the pain is that bad he needs to go back to the hospital in order to obtain pain medication via IV since he is limited in what he can take due to the condition of his kidneys. Patient said he will wait until tomorrow to see if it is any better. Advised him to call our office tomorrow if he does not have improvement and if the pain gets worse to go to the hospital.

## 2012-03-14 NOTE — Telephone Encounter (Signed)
Called pt after speaking with Dr Michaell Cowing. Per Dr Michaell Cowing he wants the pt up moving around to work thru the pain. The pt was advised to use Tylenol extra strength 2 tabs 3x's daily in between taking the Oxycodone. The pt can use up to 3-4 tabs of the Oxycodone q 6hrs per Dr Michaell Cowing. The pt needs to use ice packs and alternate with heat. The pt needs to take something for his bowels so he doesn't get constipated. The pt understands.

## 2012-03-16 ENCOUNTER — Encounter (INDEPENDENT_AMBULATORY_CARE_PROVIDER_SITE_OTHER): Payer: Self-pay

## 2012-03-16 LAB — TISSUE CULTURE

## 2012-03-18 LAB — ANAEROBIC CULTURE: Gram Stain: NONE SEEN

## 2012-03-22 ENCOUNTER — Encounter (HOSPITAL_COMMUNITY): Payer: Self-pay

## 2012-03-28 ENCOUNTER — Ambulatory Visit (INDEPENDENT_AMBULATORY_CARE_PROVIDER_SITE_OTHER): Payer: PRIVATE HEALTH INSURANCE | Admitting: Surgery

## 2012-03-28 ENCOUNTER — Encounter (INDEPENDENT_AMBULATORY_CARE_PROVIDER_SITE_OTHER): Payer: Self-pay | Admitting: Surgery

## 2012-03-28 VITALS — BP 150/82 | HR 74 | Temp 98.6°F | Ht 68.0 in | Wt 139.8 lb

## 2012-03-28 DIAGNOSIS — T85691A Other mechanical complication of intraperitoneal dialysis catheter, initial encounter: Secondary | ICD-10-CM

## 2012-03-28 DIAGNOSIS — T85611A Breakdown (mechanical) of intraperitoneal dialysis catheter, initial encounter: Secondary | ICD-10-CM

## 2012-03-28 DIAGNOSIS — N186 End stage renal disease: Secondary | ICD-10-CM

## 2012-03-28 MED ORDER — OXYCODONE HCL 5 MG PO TABS: 5.0000 mg | ORAL_TABLET | Freq: Four times a day (QID) | ORAL | Status: AC | PRN

## 2012-03-28 NOTE — Patient Instructions (Addendum)
Continue flushes of peritoneal dialysis catheter.  Have your nephrologist or peritoneal dialysis nurse call me if catheter not functioning.  Control pain more aggressively.  Watch out for constipation with the narcotic:  Managing Pain  Pain after surgery or related to activity is often due to strain/injury to muscle, tendon, nerves and/or incisions.  This pain is usually short-term and will improve in a few months.   Many people find it helpful to do the following things TOGETHER to help speed the process of healing and to get back to regular activity more quickly:  1. Avoid heavy physical activity a.  no lifting greater than 20 pounds b. Do not "push through" the pain.  Listen to your body and avoid positions and maneuvers than reproduce the pain c. Walking is okay as tolerated, but go slowly and stop when getting sore.  d. Remember: If it hurts to do it, then don't do it! 2. Take Anti-inflammatory medication  a. Take with food/snack around the clock for 1-2 weeks i. This helps the muscle and nerve tissues become less irritable and calm down faster b. Choose ONE of the following over-the-counter medications: i. Naproxen 220mg  tabs (ex. Aleve) 1-2 pills twice a day  ii. Ibuprofen 200mg  tabs (ex. Advil, Motrin) 3-4 pills with every meal and just before bedtime iii. Acetaminophen 500mg  tabs (Tylenol) 1-2 pills with every meal and just before bedtime 3. Use a Heating pad or Ice/Cold Pack a. 4-6 times a day b. May use warm bath/hottub  or showers 4. Try Gentle Massage and/or Stretching  a. at the area of pain many times a day b. stop if you feel pain - do not overdo it  Try these steps together to help you body heal faster and avoid making things get worse.  Doing just one of these things may not be enough.    If you are not getting better after two weeks or are noticing you are getting worse, contact our office for further advice; we may need to re-evaluate you & see what other things we  can do to help.  GETTING TO GOOD BOWEL HEALTH. Irregular bowel habits such as constipation and diarrhea can lead to many problems over time.  Having one soft bowel movement a day is the most important way to prevent further problems.  The anorectal canal is designed to handle stretching and feces to safely manage our ability to get rid of solid waste (feces, poop, stool) out of our body.  BUT, hard constipated stools can act like ripping concrete bricks and diarrhea can be a burning fire to this very sensitive area of our body, causing inflamed hemorrhoids, anal fissures, increasing risk is perirectal abscesses, abdominal pain/bloating, an making irritable bowel worse.     The goal: ONE SOFT BOWEL MOVEMENT A DAY!  To have soft, regular bowel movements:    Drink at least 8 tall glasses of water a day.     Take plenty of fiber.  Fiber is the undigested part of plant food that passes into the colon, acting s "natures broom" to encourage bowel motility and movement.  Fiber can absorb and hold large amounts of water. This results in a larger, bulkier stool, which is soft and easier to pass. Work gradually over several weeks up to 6 servings a day of fiber (25g a day even more if needed) in the form of: o Vegetables -- Root (potatoes, carrots, turnips), leafy green (lettuce, salad greens, celery, spinach), or cooked high residue (cabbage, broccoli,  etc) o Fruit -- Fresh (unpeeled skin & pulp), Dried (prunes, apricots, cherries, etc ),  or stewed ( applesauce)  o Whole grain breads, pasta, etc (whole wheat)  o Bran cereals    Bulking Agents -- This type of water-retaining fiber generally is easily obtained each day by one of the following:  o Psyllium bran -- The psyllium plant is remarkable because its ground seeds can retain so much water. This product is available as Metamucil, Konsyl, Effersyllium, Per Diem Fiber, or the less expensive generic preparation in drug and health food stores. Although labeled a  laxative, it really is not a laxative.  o Methylcellulose -- This is another fiber derived from wood which also retains water. It is available as Citrucel. o Polyethylene Glycol - and "artificial" fiber commonly called Miralax or Glycolax.  It is helpful for people with gassy or bloated feelings with regular fiber o Flax Seed - a less gassy fiber than psyllium   No reading or other relaxing activity while on the toilet. If bowel movements take longer than 5 minutes, you are too constipated   AVOID CONSTIPATION.  High fiber and water intake usually takes care of this.  Sometimes a laxative is needed to stimulate more frequent bowel movements, but    Laxatives are not a good long-term solution as it can wear the colon out. o Osmotics (Milk of Magnesia, Fleets phosphosoda, Magnesium citrate, MiraLax, GoLytely) are safer than  o Stimulants (Senokot, Castor Oil, Dulcolax, Ex Lax)    o Do not take laxatives for more than 7days in a row.    IF SEVERELY CONSTIPATED, try a Bowel Retraining Program: o Do not use laxatives.  o Eat a diet high in roughage, such as bran cereals and leafy vegetables.  o Drink six (6) ounces of prune or apricot juice each morning.  o Eat two (2) large servings of stewed fruit each day.  o Take one (1) heaping tablespoon of a psyllium-based bulking agent twice a day. Use sugar-free sweetener when possible to avoid excessive calories.  o Eat a normal breakfast.  o Set aside 15 minutes after breakfast to sit on the toilet, but do not strain to have a bowel movement.  o If you do not have a bowel movement by the third day, use an enema and repeat the above steps.    Controlling diarrhea o Switch to liquids and simpler foods for a few days to avoid stressing your intestines further. o Avoid dairy products (especially milk & ice cream) for a short time.  The intestines often can lose the ability to digest lactose when stressed. o Avoid foods that cause gassiness or bloating.   Typical foods include beans and other legumes, cabbage, broccoli, and dairy foods.  Every person has some sensitivity to other foods, so listen to our body and avoid those foods that trigger problems for you. o Adding fiber (Citrucel, Metamucil, psyllium, Miralax) gradually can help thicken stools by absorbing excess fluid and retrain the intestines to act more normally.  Slowly increase the dose over a few weeks.  Too much fiber too soon can backfire and cause cramping & bloating. o Probiotics (such as active yogurt, Align, etc) may help repopulate the intestines and colon with normal bacteria and calm down a sensitive digestive tract.  Most studies show it to be of mild help, though, and such products can be costly. o Medicines:   Bismuth subsalicylate (ex. Kayopectate, Pepto Bismol) every 30 minutes for up to 6 doses  can help control diarrhea.  Avoid if pregnant.   Loperamide (Immodium) can slow down diarrhea.  Start with two tablets (4mg  total) first and then try one tablet every 6 hours.  Avoid if you are having fevers or severe pain.  If you are not better or start feeling worse, stop all medicines and call your doctor for advice o Call your doctor if you are getting worse or not better.  Sometimes further testing (cultures, endoscopy, X-ray studies, bloodwork, etc) may be needed to help diagnose and treat the cause of the diarrhea. o

## 2012-03-28 NOTE — Progress Notes (Signed)
Subjective:     Patient ID: Mark Miranda, male   DOB: March 18, 1971, 41 y.o.   MRN: [id number]  HPI  Mark Miranda  1971/01/28 [id number]  Patient Care Team: Jaclyn Shaggy, MD as PCP - General (Family Medicine) Hartley Barefoot. Allena Katz, MD (Nephrology)  This patient is a 41 y.o.male who presents today for surgical evaluation.   Procedure: Laparoscopic lysis lesions and repositioning of peritoneal dialysis catheter two left pelvis.  Omentopexy.  Abdominal wall repositioning of exit point of peritoneal dialysis catheter.  03/13/2012  The patient comes in today feeling only fair.  Ran out of narcotics.  Still uncomfortable.  Not trying any pain medicines.  Thinks he try Tylenol once and it did not help.  Still smokes.  Struggling with constipation.  Was concerned that catheter may be contributing to that.  He's taking something to help his bowel movements and not remember the name. Eating okay.  No nausea or vomiting.  No fevers or chills.  No drainage from the other incisions.  He is getting some flushings of the catheter with the peritoneal dialysis nurses.  It is hard to get a story out of him but it sounds like there's been a challenge on return of fluid.  No leaking at the skin.  He was worried about infection.  Energy level coming back.  Patient Active Problem List  Diagnosis  . HTN (hypertension)  . 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 Kidney disease or stones  . End stage renal disease  . Leukocytosis  . Hypoglycemia  . Uremia  . Peritoneal dialysis catheter dysfunction s/p lap repositioning [id number]    Past Medical History  Diagnosis Date  . Diabetes mellitus   . Renal disorder   . Hypertension   . Hypercholesterolemia     Past Surgical History  Procedure Date  . Peritoneal catheter insertion   . Capd insertion 01/09/2012    Procedure: CONTINUOUS AMBULATORY PERITONEAL DIALYSIS  (CAPD) CATHETER INSERTION;  Surgeon: Ernestene Mention, MD;  Location: [facility]  OR;  Service: General;  Laterality: N/A;  Exteriorization PD Cath  . Insertion of dialysis catheter 01/13/2012    Procedure: INSERTION OF DIALYSIS CATHETER;  Surgeon: Larina Earthly, MD;  Location: [facility] OR;  Service: Vascular;  Laterality: Right;  Internal Jugular  . Av fistula placement 01/10/2012    Procedure: ARTERIOVENOUS (AV) FISTULA CREATION;  Surgeon: Sherren Kerns, MD;  Location: [facility] OR;  Service: Vascular;  Laterality: Left;  Left Brachial Cephalic Arteriovenous Fistula  . Laparoscopy 03/13/2012    Procedure: LAPAROSCOPY DIAGNOSTIC;  Surgeon: Ardeth Sportsman, MD;  Location: [facility] OR;  Service: General;  Laterality: N/A;    History   Social History  . Marital Status: Married    Spouse Name: N/A    Number of Children: N/A  . Years of Education: N/A   Occupational History  . Not on file.   Social History Main Topics  . Smoking status: Current Everyday Smoker -- 1.0 packs/day    Types: Cigarettes  . Smokeless tobacco: Never Used  . Alcohol Use: No  . Drug Use: No  . Sexually Active: Not on file   Other Topics Concern  . Not on file   Social History Narrative  . No narrative on file    Family History  Problem Relation Age of Onset  . Diabetes Mother     Current Outpatient Prescriptions  Medication Sig Dispense Refill  . amLODipine (NORVASC) 5 MG tablet Take 5 mg by  mouth daily.      . calcium carbonate (TUMS - DOSED IN MG ELEMENTAL CALCIUM) 500 MG chewable tablet Chew 1 tablet by mouth 3 (three) times daily.      Marland Kitchen gabapentin (NEURONTIN) 300 MG capsule Take 300 mg by mouth at bedtime.      . insulin aspart protamine-insulin aspart (NOVOLOG 70/30) (70-30) 100 UNIT/ML injection Inject 15-30 Units into the skin 2 (two) times daily with a meal. Inject 30 units in the morning, and inject 15 units at night.      . rosuvastatin (CRESTOR) 10 MG tablet Take 10 mg by mouth at bedtime.      . sildenafil (VIAGRA) 50 MG tablet Take 50 mg by mouth daily as needed. For erectile dysfunction       . DISCONTD: diltiazem (CARDIZEM) 30 MG tablet Take 30 mg by mouth 4 (four) times daily. Ask patient to verify dosage and frequency. Not indicated on medical history form dated 10/23/10.       Marland Kitchen DISCONTD: insulin lispro (HUMALOG) 100 UNIT/ML injection Inject into the skin 3 (three) times daily before meals. Ask patient to verify name/dosage/. History form noted dosage of 34 units during day and 22 units at night.          No Known Allergies  BP 150/82  Pulse 74  Temp 98.6 F (37 C) (Temporal)  Ht 5\' 8"  (1.727 m)  Wt 139 lb 12.8 oz (63.413 kg)  BMI 21.26 kg/m2  Dg Chest 2 View  03/08/2012  *RADIOLOGY REPORT*  Clinical Data: Preoperative evaluation for laproscopic repositioning of CAPD catheter.  Hypertension and diabetes  CHEST - 2 VIEW  Comparison: 01/13/2012  Findings: A dual lumen catheter is in place via a right subclavian approach with the superior tip ending in the region of the superior cavoatrial junction and the distal tip located greater than two vertebral bodies below the level of the carina suggesting placement in the right atrium.  Overall positioning is unchanged in comparison with prior exam.  The cardiomediastinal contour is within normal limits.  The lung fields appear clear with no signs of focal infiltrate or congestive failure.  No pleural fluid or significant peribronchial cuffing is seen and bony structures appear intact.  IMPRESSION: Dual lumen vascular catheter tip positions as noted above.  Stable cardiopulmonary appearance.  Original Report Authenticated By: Bertha Stakes, M.D.     Review of Systems  Constitutional: Negative for fever, chills and diaphoresis.  HENT: Negative for sore throat, trouble swallowing and neck pain.   Eyes: Negative for photophobia and visual disturbance.  Respiratory: Negative for choking and shortness of breath.   Cardiovascular: Negative for chest pain and palpitations.  Gastrointestinal: Positive for abdominal pain and  constipation. Negative for nausea, vomiting, diarrhea, blood in stool, abdominal distention, anal bleeding and rectal pain.  Genitourinary: Negative for dysuria, urgency, difficulty urinating and testicular pain.  Musculoskeletal: Negative for myalgias, arthralgias and gait problem.  Skin: Negative for color change and rash.  Neurological: Negative for dizziness, speech difficulty, weakness and numbness.  Hematological: Negative for adenopathy.  Psychiatric/Behavioral: Negative for hallucinations, confusion and agitation.       Objective:   Physical Exam  Constitutional: He is oriented to person, place, and time. He appears well-developed and well-nourished. No distress.  HENT:  Head: Normocephalic.  Mouth/Throat: Oropharynx is clear and moist. No oropharyngeal exudate.  Eyes: Conjunctivae and EOM are normal. Pupils are equal, round, and reactive to light. No scleral icterus.  Neck: Normal range of  motion. No tracheal deviation present.  Cardiovascular: Normal rate, normal heart sounds and intact distal pulses.   Pulmonary/Chest: Effort normal. No respiratory distress.  Abdominal: Soft. He exhibits no distension, no fluid wave and no ascites. There is tenderness in the right lower quadrant. There is no rigidity, no guarding and no CVA tenderness. Hernia confirmed negative in the right inguinal area and confirmed negative in the left inguinal area.         Incisions clean with normal healing ridges.  No hernias  Musculoskeletal: Normal range of motion. He exhibits no tenderness.  Neurological: He is alert and oriented to person, place, and time. No cranial nerve deficit. He exhibits normal muscle tone. Coordination normal.  Skin: Skin is warm and dry. No rash noted. He is not diaphoretic.  Psychiatric: He has a normal mood and affect. His behavior is normal.       Assessment:     Status post laparoscopic repositioning of peritoneal dialysis catheter away from small bowel/appendiceal  adhesions and repositioning peritoneal dialysis catheter in abdominal wall.  Flushing well but not returning well.      Plan:     Continue follow with perineal dialysis nurses & nephrology is to see if this will improve further out.  Could consider plain films to make sure the tip is still in the pelvis.  His catheter is is good as it gets.  I worried he may have a tendency to form adhesions and is not going to be able to tolerate peritoneal dialysis.  I do not see an advantage in reoperation he.  I asked him to the peroneal dialysis nurses or the nephrologist call me if it does not seem to work in the next few weeks.  Cultures are negative.  I see no evidence of posterior drainage.  I tried to reassure them of that.  I don't know how well I succeeded.  I offered to give him another prescription narcotics since he was rather sore.  Recommended he do Tylenol as well.  Bowel regimen to avoid constipation.  I strongly recommend he bring his medicines with him so we cannot follow this  Stop smoking  Diabetic control

## 2012-03-29 NOTE — Addendum Note (Signed)
Addendum  created 03/29/12 1040 by Aubery Lapping, MD   Modules edited:Anesthesia Responsible Staff

## 2012-03-29 NOTE — Addendum Note (Signed)
Addendum  created 03/29/12 1001 by Edmonia Caprio, CRNA   Modules edited:Anesthesia Events

## 2012-03-29 NOTE — Addendum Note (Signed)
Addendum  created 03/29/12 1040 by Avian Konigsberg David Mozella Rexrode, MD   Modules edited:Anesthesia Responsible Staff    

## 2012-04-09 DIAGNOSIS — N2581 Secondary hyperparathyroidism of renal origin: Secondary | ICD-10-CM | POA: Diagnosis not present

## 2012-04-09 DIAGNOSIS — N186 End stage renal disease: Secondary | ICD-10-CM | POA: Diagnosis not present

## 2012-04-09 DIAGNOSIS — D509 Iron deficiency anemia, unspecified: Secondary | ICD-10-CM | POA: Diagnosis not present

## 2012-04-09 DIAGNOSIS — Z992 Dependence on renal dialysis: Secondary | ICD-10-CM | POA: Diagnosis not present

## 2012-04-11 DIAGNOSIS — N186 End stage renal disease: Secondary | ICD-10-CM | POA: Diagnosis not present

## 2012-04-11 DIAGNOSIS — D509 Iron deficiency anemia, unspecified: Secondary | ICD-10-CM | POA: Diagnosis not present

## 2012-04-11 DIAGNOSIS — N2581 Secondary hyperparathyroidism of renal origin: Secondary | ICD-10-CM | POA: Diagnosis not present

## 2012-04-11 DIAGNOSIS — Z992 Dependence on renal dialysis: Secondary | ICD-10-CM | POA: Diagnosis not present

## 2012-04-13 DIAGNOSIS — D509 Iron deficiency anemia, unspecified: Secondary | ICD-10-CM | POA: Diagnosis not present

## 2012-04-13 DIAGNOSIS — Z992 Dependence on renal dialysis: Secondary | ICD-10-CM | POA: Diagnosis not present

## 2012-04-13 DIAGNOSIS — N186 End stage renal disease: Secondary | ICD-10-CM | POA: Diagnosis not present

## 2012-04-13 DIAGNOSIS — N2581 Secondary hyperparathyroidism of renal origin: Secondary | ICD-10-CM | POA: Diagnosis not present

## 2012-04-16 DIAGNOSIS — N186 End stage renal disease: Secondary | ICD-10-CM | POA: Diagnosis not present

## 2012-04-16 DIAGNOSIS — N2581 Secondary hyperparathyroidism of renal origin: Secondary | ICD-10-CM | POA: Diagnosis not present

## 2012-04-16 DIAGNOSIS — D509 Iron deficiency anemia, unspecified: Secondary | ICD-10-CM | POA: Diagnosis not present

## 2012-04-16 DIAGNOSIS — Z992 Dependence on renal dialysis: Secondary | ICD-10-CM | POA: Diagnosis not present

## 2012-04-18 DIAGNOSIS — N186 End stage renal disease: Secondary | ICD-10-CM | POA: Diagnosis not present

## 2012-04-18 DIAGNOSIS — Z992 Dependence on renal dialysis: Secondary | ICD-10-CM | POA: Diagnosis not present

## 2012-04-18 DIAGNOSIS — N2581 Secondary hyperparathyroidism of renal origin: Secondary | ICD-10-CM | POA: Diagnosis not present

## 2012-04-18 DIAGNOSIS — D509 Iron deficiency anemia, unspecified: Secondary | ICD-10-CM | POA: Diagnosis not present

## 2012-04-20 DIAGNOSIS — D509 Iron deficiency anemia, unspecified: Secondary | ICD-10-CM | POA: Diagnosis not present

## 2012-04-20 DIAGNOSIS — N2581 Secondary hyperparathyroidism of renal origin: Secondary | ICD-10-CM | POA: Diagnosis not present

## 2012-04-20 DIAGNOSIS — N186 End stage renal disease: Secondary | ICD-10-CM | POA: Diagnosis not present

## 2012-04-20 DIAGNOSIS — Z992 Dependence on renal dialysis: Secondary | ICD-10-CM | POA: Diagnosis not present

## 2012-04-23 DIAGNOSIS — N186 End stage renal disease: Secondary | ICD-10-CM | POA: Diagnosis not present

## 2012-04-23 DIAGNOSIS — N2581 Secondary hyperparathyroidism of renal origin: Secondary | ICD-10-CM | POA: Diagnosis not present

## 2012-04-23 DIAGNOSIS — D509 Iron deficiency anemia, unspecified: Secondary | ICD-10-CM | POA: Diagnosis not present

## 2012-04-23 DIAGNOSIS — Z992 Dependence on renal dialysis: Secondary | ICD-10-CM | POA: Diagnosis not present

## 2012-04-25 DIAGNOSIS — N2581 Secondary hyperparathyroidism of renal origin: Secondary | ICD-10-CM | POA: Diagnosis not present

## 2012-04-25 DIAGNOSIS — Z992 Dependence on renal dialysis: Secondary | ICD-10-CM | POA: Diagnosis not present

## 2012-04-25 DIAGNOSIS — D509 Iron deficiency anemia, unspecified: Secondary | ICD-10-CM | POA: Diagnosis not present

## 2012-04-25 DIAGNOSIS — N186 End stage renal disease: Secondary | ICD-10-CM | POA: Diagnosis not present

## 2012-04-27 DIAGNOSIS — N2581 Secondary hyperparathyroidism of renal origin: Secondary | ICD-10-CM | POA: Diagnosis not present

## 2012-04-27 DIAGNOSIS — D509 Iron deficiency anemia, unspecified: Secondary | ICD-10-CM | POA: Diagnosis not present

## 2012-04-27 DIAGNOSIS — N186 End stage renal disease: Secondary | ICD-10-CM | POA: Diagnosis not present

## 2012-04-27 DIAGNOSIS — Z992 Dependence on renal dialysis: Secondary | ICD-10-CM | POA: Diagnosis not present

## 2012-04-30 DIAGNOSIS — Z992 Dependence on renal dialysis: Secondary | ICD-10-CM | POA: Diagnosis not present

## 2012-04-30 DIAGNOSIS — D509 Iron deficiency anemia, unspecified: Secondary | ICD-10-CM | POA: Diagnosis not present

## 2012-04-30 DIAGNOSIS — N2581 Secondary hyperparathyroidism of renal origin: Secondary | ICD-10-CM | POA: Diagnosis not present

## 2012-04-30 DIAGNOSIS — N186 End stage renal disease: Secondary | ICD-10-CM | POA: Diagnosis not present

## 2012-05-02 DIAGNOSIS — N2581 Secondary hyperparathyroidism of renal origin: Secondary | ICD-10-CM | POA: Diagnosis not present

## 2012-05-02 DIAGNOSIS — Z992 Dependence on renal dialysis: Secondary | ICD-10-CM | POA: Diagnosis not present

## 2012-05-02 DIAGNOSIS — N186 End stage renal disease: Secondary | ICD-10-CM | POA: Diagnosis not present

## 2012-05-02 DIAGNOSIS — D509 Iron deficiency anemia, unspecified: Secondary | ICD-10-CM | POA: Diagnosis not present

## 2012-05-04 DIAGNOSIS — Z992 Dependence on renal dialysis: Secondary | ICD-10-CM | POA: Diagnosis not present

## 2012-05-04 DIAGNOSIS — N186 End stage renal disease: Secondary | ICD-10-CM | POA: Diagnosis not present

## 2012-05-04 DIAGNOSIS — D509 Iron deficiency anemia, unspecified: Secondary | ICD-10-CM | POA: Diagnosis not present

## 2012-05-04 DIAGNOSIS — N2581 Secondary hyperparathyroidism of renal origin: Secondary | ICD-10-CM | POA: Diagnosis not present

## 2012-05-07 DIAGNOSIS — N186 End stage renal disease: Secondary | ICD-10-CM | POA: Diagnosis not present

## 2012-05-08 DIAGNOSIS — D509 Iron deficiency anemia, unspecified: Secondary | ICD-10-CM | POA: Diagnosis not present

## 2012-05-08 DIAGNOSIS — N186 End stage renal disease: Secondary | ICD-10-CM | POA: Diagnosis not present

## 2012-05-08 DIAGNOSIS — Z23 Encounter for immunization: Secondary | ICD-10-CM | POA: Diagnosis not present

## 2012-05-08 DIAGNOSIS — N2581 Secondary hyperparathyroidism of renal origin: Secondary | ICD-10-CM | POA: Diagnosis not present

## 2012-05-08 DIAGNOSIS — E119 Type 2 diabetes mellitus without complications: Secondary | ICD-10-CM | POA: Diagnosis not present

## 2012-05-08 DIAGNOSIS — Z992 Dependence on renal dialysis: Secondary | ICD-10-CM | POA: Diagnosis not present

## 2012-05-09 DIAGNOSIS — N2581 Secondary hyperparathyroidism of renal origin: Secondary | ICD-10-CM | POA: Diagnosis not present

## 2012-05-09 DIAGNOSIS — Z992 Dependence on renal dialysis: Secondary | ICD-10-CM | POA: Diagnosis not present

## 2012-05-09 DIAGNOSIS — N186 End stage renal disease: Secondary | ICD-10-CM | POA: Diagnosis not present

## 2012-05-09 DIAGNOSIS — D509 Iron deficiency anemia, unspecified: Secondary | ICD-10-CM | POA: Diagnosis not present

## 2012-05-09 DIAGNOSIS — Z23 Encounter for immunization: Secondary | ICD-10-CM | POA: Diagnosis not present

## 2012-05-11 DIAGNOSIS — Z992 Dependence on renal dialysis: Secondary | ICD-10-CM | POA: Diagnosis not present

## 2012-05-11 DIAGNOSIS — Z23 Encounter for immunization: Secondary | ICD-10-CM | POA: Diagnosis not present

## 2012-05-11 DIAGNOSIS — D509 Iron deficiency anemia, unspecified: Secondary | ICD-10-CM | POA: Diagnosis not present

## 2012-05-11 DIAGNOSIS — N186 End stage renal disease: Secondary | ICD-10-CM | POA: Diagnosis not present

## 2012-05-11 DIAGNOSIS — N2581 Secondary hyperparathyroidism of renal origin: Secondary | ICD-10-CM | POA: Diagnosis not present

## 2012-05-14 DIAGNOSIS — N186 End stage renal disease: Secondary | ICD-10-CM | POA: Diagnosis not present

## 2012-05-14 DIAGNOSIS — Z23 Encounter for immunization: Secondary | ICD-10-CM | POA: Diagnosis not present

## 2012-05-15 DIAGNOSIS — N186 End stage renal disease: Secondary | ICD-10-CM | POA: Diagnosis not present

## 2012-05-15 DIAGNOSIS — Z23 Encounter for immunization: Secondary | ICD-10-CM | POA: Diagnosis not present

## 2012-05-16 DIAGNOSIS — N186 End stage renal disease: Secondary | ICD-10-CM | POA: Diagnosis not present

## 2012-05-16 DIAGNOSIS — Z23 Encounter for immunization: Secondary | ICD-10-CM | POA: Diagnosis not present

## 2012-05-17 DIAGNOSIS — Z23 Encounter for immunization: Secondary | ICD-10-CM | POA: Diagnosis not present

## 2012-05-17 DIAGNOSIS — N186 End stage renal disease: Secondary | ICD-10-CM | POA: Diagnosis not present

## 2012-05-18 DIAGNOSIS — Z23 Encounter for immunization: Secondary | ICD-10-CM | POA: Diagnosis not present

## 2012-05-18 DIAGNOSIS — N186 End stage renal disease: Secondary | ICD-10-CM | POA: Diagnosis not present

## 2012-05-19 DIAGNOSIS — D509 Iron deficiency anemia, unspecified: Secondary | ICD-10-CM | POA: Diagnosis not present

## 2012-05-19 DIAGNOSIS — N186 End stage renal disease: Secondary | ICD-10-CM | POA: Diagnosis not present

## 2012-05-20 DIAGNOSIS — D509 Iron deficiency anemia, unspecified: Secondary | ICD-10-CM | POA: Diagnosis not present

## 2012-05-20 DIAGNOSIS — N186 End stage renal disease: Secondary | ICD-10-CM | POA: Diagnosis not present

## 2012-05-21 ENCOUNTER — Other Ambulatory Visit (HOSPITAL_COMMUNITY): Payer: Self-pay | Admitting: Nephrology

## 2012-05-21 DIAGNOSIS — N186 End stage renal disease: Secondary | ICD-10-CM | POA: Diagnosis not present

## 2012-05-21 DIAGNOSIS — D509 Iron deficiency anemia, unspecified: Secondary | ICD-10-CM | POA: Diagnosis not present

## 2012-05-22 DIAGNOSIS — N186 End stage renal disease: Secondary | ICD-10-CM | POA: Diagnosis not present

## 2012-05-22 DIAGNOSIS — D509 Iron deficiency anemia, unspecified: Secondary | ICD-10-CM | POA: Diagnosis not present

## 2012-05-23 ENCOUNTER — Ambulatory Visit (HOSPITAL_COMMUNITY)
Admission: RE | Admit: 2012-05-23 | Discharge: 2012-05-23 | Disposition: A | Discharge status: Home / Self Care | Payer: Medicare Other | Source: Ambulatory Visit | Attending: Nephrology | Admitting: Nephrology

## 2012-05-23 DIAGNOSIS — N186 End stage renal disease: Secondary | ICD-10-CM

## 2012-05-23 DIAGNOSIS — D509 Iron deficiency anemia, unspecified: Secondary | ICD-10-CM | POA: Diagnosis not present

## 2012-05-23 DIAGNOSIS — Z4682 Encounter for fitting and adjustment of non-vascular catheter: Secondary | ICD-10-CM | POA: Diagnosis not present

## 2012-05-23 DIAGNOSIS — Z452 Encounter for adjustment and management of vascular access device: Secondary | ICD-10-CM | POA: Diagnosis not present

## 2012-05-23 MED ORDER — CHLORHEXIDINE GLUCONATE 4 % EX LIQD
CUTANEOUS | Status: AC
  Filled 2012-05-23: qty 30

## 2012-05-23 NOTE — Procedures (Signed)
SUCCESSFUL RT IJ HD CATH REMOVAL NO COMP STABLE FULL REPORT IN PACS

## 2012-05-24 DIAGNOSIS — N186 End stage renal disease: Secondary | ICD-10-CM | POA: Diagnosis not present

## 2012-05-24 DIAGNOSIS — D509 Iron deficiency anemia, unspecified: Secondary | ICD-10-CM | POA: Diagnosis not present

## 2012-05-25 DIAGNOSIS — D509 Iron deficiency anemia, unspecified: Secondary | ICD-10-CM | POA: Diagnosis not present

## 2012-05-25 DIAGNOSIS — N186 End stage renal disease: Secondary | ICD-10-CM | POA: Diagnosis not present

## 2012-05-26 DIAGNOSIS — N186 End stage renal disease: Secondary | ICD-10-CM | POA: Diagnosis not present

## 2012-05-26 DIAGNOSIS — D509 Iron deficiency anemia, unspecified: Secondary | ICD-10-CM | POA: Diagnosis not present

## 2012-05-27 DIAGNOSIS — N186 End stage renal disease: Secondary | ICD-10-CM | POA: Diagnosis not present

## 2012-05-27 DIAGNOSIS — D509 Iron deficiency anemia, unspecified: Secondary | ICD-10-CM | POA: Diagnosis not present

## 2012-05-28 DIAGNOSIS — D509 Iron deficiency anemia, unspecified: Secondary | ICD-10-CM | POA: Diagnosis not present

## 2012-05-28 DIAGNOSIS — N186 End stage renal disease: Secondary | ICD-10-CM | POA: Diagnosis not present

## 2012-05-29 DIAGNOSIS — N186 End stage renal disease: Secondary | ICD-10-CM | POA: Diagnosis not present

## 2012-05-29 DIAGNOSIS — D509 Iron deficiency anemia, unspecified: Secondary | ICD-10-CM | POA: Diagnosis not present

## 2012-05-30 DIAGNOSIS — D509 Iron deficiency anemia, unspecified: Secondary | ICD-10-CM | POA: Diagnosis not present

## 2012-05-30 DIAGNOSIS — N186 End stage renal disease: Secondary | ICD-10-CM | POA: Diagnosis not present

## 2012-05-31 DIAGNOSIS — D509 Iron deficiency anemia, unspecified: Secondary | ICD-10-CM | POA: Diagnosis not present

## 2012-05-31 DIAGNOSIS — N186 End stage renal disease: Secondary | ICD-10-CM | POA: Diagnosis not present

## 2012-06-01 DIAGNOSIS — D509 Iron deficiency anemia, unspecified: Secondary | ICD-10-CM | POA: Diagnosis not present

## 2012-06-01 DIAGNOSIS — N186 End stage renal disease: Secondary | ICD-10-CM | POA: Diagnosis not present

## 2012-06-02 DIAGNOSIS — N186 End stage renal disease: Secondary | ICD-10-CM | POA: Diagnosis not present

## 2012-06-02 DIAGNOSIS — D509 Iron deficiency anemia, unspecified: Secondary | ICD-10-CM | POA: Diagnosis not present

## 2012-06-03 DIAGNOSIS — D509 Iron deficiency anemia, unspecified: Secondary | ICD-10-CM | POA: Diagnosis not present

## 2012-06-03 DIAGNOSIS — N186 End stage renal disease: Secondary | ICD-10-CM | POA: Diagnosis not present

## 2012-06-04 DIAGNOSIS — D509 Iron deficiency anemia, unspecified: Secondary | ICD-10-CM | POA: Diagnosis not present

## 2012-06-04 DIAGNOSIS — N186 End stage renal disease: Secondary | ICD-10-CM | POA: Diagnosis not present

## 2012-06-05 DIAGNOSIS — D509 Iron deficiency anemia, unspecified: Secondary | ICD-10-CM | POA: Diagnosis not present

## 2012-06-05 DIAGNOSIS — N186 End stage renal disease: Secondary | ICD-10-CM | POA: Diagnosis not present

## 2012-06-06 DIAGNOSIS — N186 End stage renal disease: Secondary | ICD-10-CM | POA: Diagnosis not present

## 2012-06-06 DIAGNOSIS — D509 Iron deficiency anemia, unspecified: Secondary | ICD-10-CM | POA: Diagnosis not present

## 2012-06-07 DIAGNOSIS — N186 End stage renal disease: Secondary | ICD-10-CM | POA: Diagnosis not present

## 2012-06-07 DIAGNOSIS — D509 Iron deficiency anemia, unspecified: Secondary | ICD-10-CM | POA: Diagnosis not present

## 2012-06-08 DIAGNOSIS — Z23 Encounter for immunization: Secondary | ICD-10-CM | POA: Diagnosis not present

## 2012-06-08 DIAGNOSIS — Z992 Dependence on renal dialysis: Secondary | ICD-10-CM | POA: Diagnosis not present

## 2012-06-08 DIAGNOSIS — N2581 Secondary hyperparathyroidism of renal origin: Secondary | ICD-10-CM | POA: Diagnosis not present

## 2012-06-08 DIAGNOSIS — D509 Iron deficiency anemia, unspecified: Secondary | ICD-10-CM | POA: Diagnosis not present

## 2012-06-08 DIAGNOSIS — N186 End stage renal disease: Secondary | ICD-10-CM | POA: Diagnosis not present

## 2012-06-29 DIAGNOSIS — N186 End stage renal disease: Secondary | ICD-10-CM | POA: Diagnosis not present

## 2012-07-04 ENCOUNTER — Other Ambulatory Visit (HOSPITAL_COMMUNITY): Payer: Self-pay | Admitting: *Deleted

## 2012-07-04 DIAGNOSIS — R0600 Dyspnea, unspecified: Secondary | ICD-10-CM

## 2012-07-04 DIAGNOSIS — R0609 Other forms of dyspnea: Secondary | ICD-10-CM

## 2012-07-07 DIAGNOSIS — N186 End stage renal disease: Secondary | ICD-10-CM | POA: Diagnosis not present

## 2012-07-08 DIAGNOSIS — N186 End stage renal disease: Secondary | ICD-10-CM | POA: Diagnosis not present

## 2012-07-08 DIAGNOSIS — Z299 Encounter for prophylactic measures, unspecified: Secondary | ICD-10-CM | POA: Diagnosis not present

## 2012-07-08 DIAGNOSIS — Z992 Dependence on renal dialysis: Secondary | ICD-10-CM | POA: Diagnosis not present

## 2012-07-08 DIAGNOSIS — N2581 Secondary hyperparathyroidism of renal origin: Secondary | ICD-10-CM | POA: Diagnosis not present

## 2012-07-08 DIAGNOSIS — D509 Iron deficiency anemia, unspecified: Secondary | ICD-10-CM | POA: Diagnosis not present

## 2012-07-09 DIAGNOSIS — N2581 Secondary hyperparathyroidism of renal origin: Secondary | ICD-10-CM | POA: Diagnosis not present

## 2012-07-09 DIAGNOSIS — Z299 Encounter for prophylactic measures, unspecified: Secondary | ICD-10-CM | POA: Diagnosis not present

## 2012-07-09 DIAGNOSIS — N186 End stage renal disease: Secondary | ICD-10-CM | POA: Diagnosis not present

## 2012-07-09 DIAGNOSIS — D509 Iron deficiency anemia, unspecified: Secondary | ICD-10-CM | POA: Diagnosis not present

## 2012-07-09 DIAGNOSIS — Z992 Dependence on renal dialysis: Secondary | ICD-10-CM | POA: Diagnosis not present

## 2012-07-10 ENCOUNTER — Ambulatory Visit (HOSPITAL_COMMUNITY)
Admission: RE | Admit: 2012-07-10 | Discharge: 2012-07-10 | Disposition: A | Discharge status: Home / Self Care | Payer: Medicare Other | Source: Ambulatory Visit | Attending: Nephrology | Admitting: Nephrology

## 2012-07-10 DIAGNOSIS — N19 Unspecified kidney failure: Secondary | ICD-10-CM | POA: Diagnosis not present

## 2012-07-10 DIAGNOSIS — R0600 Dyspnea, unspecified: Secondary | ICD-10-CM

## 2012-07-10 DIAGNOSIS — N2581 Secondary hyperparathyroidism of renal origin: Secondary | ICD-10-CM | POA: Diagnosis not present

## 2012-07-10 DIAGNOSIS — D509 Iron deficiency anemia, unspecified: Secondary | ICD-10-CM | POA: Diagnosis not present

## 2012-07-10 DIAGNOSIS — R0609 Other forms of dyspnea: Secondary | ICD-10-CM | POA: Diagnosis not present

## 2012-07-10 DIAGNOSIS — R0989 Other specified symptoms and signs involving the circulatory and respiratory systems: Secondary | ICD-10-CM | POA: Diagnosis not present

## 2012-07-10 DIAGNOSIS — N186 End stage renal disease: Secondary | ICD-10-CM | POA: Diagnosis not present

## 2012-07-10 DIAGNOSIS — Z299 Encounter for prophylactic measures, unspecified: Secondary | ICD-10-CM | POA: Diagnosis not present

## 2012-07-10 DIAGNOSIS — Z992 Dependence on renal dialysis: Secondary | ICD-10-CM | POA: Diagnosis not present

## 2012-07-10 NOTE — Progress Notes (Signed)
  Echocardiogram 2D Echocardiogram has been performed.  Mark Miranda 07/10/2012, 2:45 PM

## 2012-07-11 DIAGNOSIS — D509 Iron deficiency anemia, unspecified: Secondary | ICD-10-CM | POA: Diagnosis not present

## 2012-07-11 DIAGNOSIS — N186 End stage renal disease: Secondary | ICD-10-CM | POA: Diagnosis not present

## 2012-07-11 DIAGNOSIS — Z299 Encounter for prophylactic measures, unspecified: Secondary | ICD-10-CM | POA: Diagnosis not present

## 2012-07-11 DIAGNOSIS — Z992 Dependence on renal dialysis: Secondary | ICD-10-CM | POA: Diagnosis not present

## 2012-07-11 DIAGNOSIS — N2581 Secondary hyperparathyroidism of renal origin: Secondary | ICD-10-CM | POA: Diagnosis not present

## 2012-07-12 DIAGNOSIS — D509 Iron deficiency anemia, unspecified: Secondary | ICD-10-CM | POA: Diagnosis not present

## 2012-07-12 DIAGNOSIS — N2581 Secondary hyperparathyroidism of renal origin: Secondary | ICD-10-CM | POA: Diagnosis not present

## 2012-07-12 DIAGNOSIS — Z299 Encounter for prophylactic measures, unspecified: Secondary | ICD-10-CM | POA: Diagnosis not present

## 2012-07-12 DIAGNOSIS — N186 End stage renal disease: Secondary | ICD-10-CM | POA: Diagnosis not present

## 2012-07-12 DIAGNOSIS — Z992 Dependence on renal dialysis: Secondary | ICD-10-CM | POA: Diagnosis not present

## 2012-07-13 DIAGNOSIS — N186 End stage renal disease: Secondary | ICD-10-CM | POA: Diagnosis not present

## 2012-07-13 DIAGNOSIS — D509 Iron deficiency anemia, unspecified: Secondary | ICD-10-CM | POA: Diagnosis not present

## 2012-07-13 DIAGNOSIS — Z992 Dependence on renal dialysis: Secondary | ICD-10-CM | POA: Diagnosis not present

## 2012-07-13 DIAGNOSIS — Z299 Encounter for prophylactic measures, unspecified: Secondary | ICD-10-CM | POA: Diagnosis not present

## 2012-07-13 DIAGNOSIS — N2581 Secondary hyperparathyroidism of renal origin: Secondary | ICD-10-CM | POA: Diagnosis not present

## 2012-07-14 DIAGNOSIS — Z992 Dependence on renal dialysis: Secondary | ICD-10-CM | POA: Diagnosis not present

## 2012-07-14 DIAGNOSIS — N186 End stage renal disease: Secondary | ICD-10-CM | POA: Diagnosis not present

## 2012-07-14 DIAGNOSIS — D509 Iron deficiency anemia, unspecified: Secondary | ICD-10-CM | POA: Diagnosis not present

## 2012-07-14 DIAGNOSIS — N2581 Secondary hyperparathyroidism of renal origin: Secondary | ICD-10-CM | POA: Diagnosis not present

## 2012-07-14 DIAGNOSIS — Z299 Encounter for prophylactic measures, unspecified: Secondary | ICD-10-CM | POA: Diagnosis not present

## 2012-07-15 DIAGNOSIS — Z992 Dependence on renal dialysis: Secondary | ICD-10-CM | POA: Diagnosis not present

## 2012-07-15 DIAGNOSIS — N2581 Secondary hyperparathyroidism of renal origin: Secondary | ICD-10-CM | POA: Diagnosis not present

## 2012-07-15 DIAGNOSIS — N186 End stage renal disease: Secondary | ICD-10-CM | POA: Diagnosis not present

## 2012-07-15 DIAGNOSIS — D509 Iron deficiency anemia, unspecified: Secondary | ICD-10-CM | POA: Diagnosis not present

## 2012-07-15 DIAGNOSIS — Z299 Encounter for prophylactic measures, unspecified: Secondary | ICD-10-CM | POA: Diagnosis not present

## 2012-07-16 DIAGNOSIS — D509 Iron deficiency anemia, unspecified: Secondary | ICD-10-CM | POA: Diagnosis not present

## 2012-07-16 DIAGNOSIS — Z299 Encounter for prophylactic measures, unspecified: Secondary | ICD-10-CM | POA: Diagnosis not present

## 2012-07-16 DIAGNOSIS — Z992 Dependence on renal dialysis: Secondary | ICD-10-CM | POA: Diagnosis not present

## 2012-07-16 DIAGNOSIS — N186 End stage renal disease: Secondary | ICD-10-CM | POA: Diagnosis not present

## 2012-07-16 DIAGNOSIS — N2581 Secondary hyperparathyroidism of renal origin: Secondary | ICD-10-CM | POA: Diagnosis not present

## 2012-07-17 ENCOUNTER — Telehealth: Payer: Self-pay | Admitting: *Deleted

## 2012-07-17 ENCOUNTER — Encounter: Payer: Self-pay | Admitting: Internal Medicine

## 2012-07-17 ENCOUNTER — Other Ambulatory Visit (INDEPENDENT_AMBULATORY_CARE_PROVIDER_SITE_OTHER): Payer: Medicare Other

## 2012-07-17 ENCOUNTER — Ambulatory Visit (INDEPENDENT_AMBULATORY_CARE_PROVIDER_SITE_OTHER): Payer: Medicare Other | Admitting: Internal Medicine

## 2012-07-17 VITALS — BP 152/88 | HR 88 | Temp 98.5°F | Ht 68.0 in | Wt 141.0 lb

## 2012-07-17 DIAGNOSIS — E1065 Type 1 diabetes mellitus with hyperglycemia: Secondary | ICD-10-CM

## 2012-07-17 DIAGNOSIS — N529 Male erectile dysfunction, unspecified: Secondary | ICD-10-CM

## 2012-07-17 DIAGNOSIS — Z13220 Encounter for screening for lipoid disorders: Secondary | ICD-10-CM

## 2012-07-17 DIAGNOSIS — D509 Iron deficiency anemia, unspecified: Secondary | ICD-10-CM | POA: Diagnosis not present

## 2012-07-17 DIAGNOSIS — N2581 Secondary hyperparathyroidism of renal origin: Secondary | ICD-10-CM | POA: Diagnosis not present

## 2012-07-17 DIAGNOSIS — M549 Dorsalgia, unspecified: Secondary | ICD-10-CM

## 2012-07-17 DIAGNOSIS — Z0000 Encounter for general adult medical examination without abnormal findings: Secondary | ICD-10-CM | POA: Diagnosis not present

## 2012-07-17 DIAGNOSIS — N186 End stage renal disease: Secondary | ICD-10-CM

## 2012-07-17 DIAGNOSIS — Z131 Encounter for screening for diabetes mellitus: Secondary | ICD-10-CM

## 2012-07-17 DIAGNOSIS — Z130 Encounter for screening for diseases of the blood and blood-forming organs and certain disorders involving the immune mechanism: Secondary | ICD-10-CM | POA: Diagnosis not present

## 2012-07-17 DIAGNOSIS — Z1331 Encounter for screening for depression: Secondary | ICD-10-CM

## 2012-07-17 DIAGNOSIS — Z299 Encounter for prophylactic measures, unspecified: Secondary | ICD-10-CM | POA: Diagnosis not present

## 2012-07-17 DIAGNOSIS — Z992 Dependence on renal dialysis: Secondary | ICD-10-CM | POA: Diagnosis not present

## 2012-07-17 LAB — CBC
MCHC: 32.2 g/dL (ref 30.0–36.0)
MCV: 97.3 fl (ref 78.0–100.0)
Platelets: 306 10*3/uL (ref 150.0–400.0)
RBC: 4.69 Mil/uL (ref 4.22–5.81)

## 2012-07-17 LAB — BASIC METABOLIC PANEL
BUN: 58 mg/dL — ABNORMAL HIGH (ref 6–23)
Creatinine, Ser: 10.5 mg/dL (ref 0.4–1.5)
GFR: 5.83 mL/min — CL (ref >60.00)
Potassium: 4.4 mEq/L (ref 3.5–5.1)

## 2012-07-17 LAB — LDL CHOLESTEROL, DIRECT: Direct LDL: 142 mg/dL

## 2012-07-17 LAB — LIPID PANEL
HDL: 29.5 mg/dL — ABNORMAL LOW (ref >39.00)
Total CHOL/HDL Ratio: 7
VLDL: 66 mg/dL — ABNORMAL HIGH (ref 0.0–40.0)

## 2012-07-17 MED ORDER — VARDENAFIL HCL 20 MG PO TABS: 20.0000 mg | ORAL_TABLET | Freq: Every day | ORAL | Status: DC | PRN

## 2012-07-17 MED ORDER — OXYCODONE HCL 5 MG PO TABS: 5.0000 mg | ORAL_TABLET | ORAL | Status: DC | PRN

## 2012-07-17 MED ORDER — MELOXICAM 15 MG PO TABS: 15.0000 mg | ORAL_TABLET | Freq: Every day | ORAL | Status: DC

## 2012-07-17 NOTE — Telephone Encounter (Signed)
Pt with ESRD, noted

## 2012-07-17 NOTE — Patient Instructions (Signed)
Health Maintenance, Males A healthy lifestyle and preventative care can promote health and wellness.  Maintain regular health, dental, and eye exams.  Eat a healthy diet. Foods like vegetables, fruits, whole grains, low-fat dairy products, and lean protein foods contain the nutrients you need without too many calories. Decrease your intake of foods high in solid fats, added sugars, and salt. Get information about a proper diet from your caregiver, if necessary.  Regular physical exercise is one of the most important things you can do for your health. Most adults should get at least 150 minutes of moderate-intensity exercise (any activity that increases your heart rate and causes you to sweat) each week. In addition, most adults need muscle-strengthening exercises on 2 or more days a week.   Maintain a healthy weight. The body mass index (BMI) is a screening tool to identify possible weight problems. It provides an estimate of body fat based on height and weight. Your caregiver can help determine your BMI, and can help you achieve or maintain a healthy weight. For adults 20 years and older:  A BMI below 18.5 is considered underweight.  A BMI of 18.5 to 24.9 is normal.  A BMI of 25 to 29.9 is considered overweight.  A BMI of 30 and above is considered obese.  Maintain normal blood lipids and cholesterol by exercising and minimizing your intake of saturated fat. Eat a balanced diet with plenty of fruits and vegetables. Blood tests for lipids and cholesterol should begin at age 20 and be repeated every 5 years. If your lipid or cholesterol levels are high, you are over 50, or you are a high risk for heart disease, you may need your cholesterol levels checked more frequently.Ongoing high lipid and cholesterol levels should be treated with medicines, if diet and exercise are not effective.  If you smoke, find out from your caregiver how to quit. If you do not use tobacco, do not start.  If you  choose to drink alcohol, do not exceed 2 drinks per day. One drink is considered to be 12 ounces (355 mL) of beer, 5 ounces (148 mL) of wine, or 1.5 ounces (44 mL) of liquor.  Avoid use of street drugs. Do not share needles with anyone. Ask for help if you need support or instructions about stopping the use of drugs.  High blood pressure causes heart disease and increases the risk of stroke. Blood pressure should be checked at least every 1 to 2 years. Ongoing high blood pressure should be treated with medicines if weight loss and exercise are not effective.  If you are 45 to 39 years old, ask your caregiver if you should take aspirin to prevent heart disease.  Diabetes screening involves taking a blood sample to check your fasting blood sugar level. This should be done once every 3 years, after age 45, if you are within normal weight and without risk factors for diabetes. Testing should be considered at a younger age or be carried out more frequently if you are overweight and have at least 1 risk factor for diabetes.  Colorectal cancer can be detected and often prevented. Most routine colorectal cancer screening begins at the age of 50 and continues through age 75. However, your caregiver may recommend screening at an earlier age if you have risk factors for colon cancer. On a yearly basis, your caregiver may provide home test kits to check for hidden blood in the stool. Use of a small camera at the end of a tube,   to directly examine the colon (sigmoidoscopy or colonoscopy), can detect the earliest forms of colorectal cancer. Talk to your caregiver about this at age 50, when routine screening begins. Direct examination of the colon should be repeated every 5 to 10 years through age 75, unless early forms of pre-cancerous polyps or small growths are found.  Hepatitis C blood testing is recommended for all people born from 1945 through 1965 and any individual with known risks for hepatitis C.  Healthy  men should no longer receive prostate-specific antigen (PSA) blood tests as part of routine cancer screening. Consult with your caregiver about prostate cancer screening.  Testicular cancer screening is not recommended for adolescents or adult males who have no symptoms. Screening includes self-exam, caregiver exam, and other screening tests. Consult with your caregiver about any symptoms you have or any concerns you have about testicular cancer.  Practice safe sex. Use condoms and avoid high-risk sexual practices to reduce the spread of sexually transmitted infections (STIs).  Use sunscreen with a sun protection factor (SPF) of 30 or greater. Apply sunscreen liberally and repeatedly throughout the day. You should seek shade when your shadow is shorter than you. Protect yourself by wearing long sleeves, pants, a wide-brimmed hat, and sunglasses year round, whenever you are outdoors.  Notify your caregiver of new moles or changes in moles, especially if there is a change in shape or color. Also notify your caregiver if a mole is larger than the size of a pencil eraser.  A one-time screening for abdominal aortic aneurysm (AAA) and surgical repair of large AAAs by sound wave imaging (ultrasonography) is recommended for ages 65 to 75 years who are current or former smokers.  Stay current with your immunizations. Document Released: 01/21/2008 Document Revised: 10/17/2011 Document Reviewed: 12/20/2010 [employer] Patient Information 2013 [employer].  

## 2012-07-17 NOTE — Progress Notes (Signed)
HPI  Pt presents to the clinic today to establish care. [facility]. He has no concerns other than he ran out of some of his medications and he would like them refilled. He needs refill of viagra, oxycodone and mobic.  Past Medical History  Diagnosis Date  . Diabetes mellitus   . Renal disorder   . Hypertension   . Hypercholesterolemia   . Depression   . Migraines     Current Outpatient Prescriptions  Medication Sig Dispense Refill  . calcium carbonate (TUMS - DOSED IN MG ELEMENTAL CALCIUM) 500 MG chewable tablet Chew 1 tablet by mouth 3 (three) times daily.      Marland Kitchen diltiazem (CARDIZEM CD) 120 MG 24 hr capsule Take 120 mg by mouth daily.       . insulin aspart protamine-insulin aspart (NOVOLOG 70/30) (70-30) 100 UNIT/ML injection Inject 30 units in the morning, and inject 20 units at night.      Marland Kitchen amLODipine (NORVASC) 5 MG tablet Take 5 mg by mouth daily.      Marland Kitchen gabapentin (NEURONTIN) 300 MG capsule Take 300 mg by mouth at bedtime.      . meloxicam (MOBIC) 15 MG tablet Take 1 tablet (15 mg total) by mouth daily.  30 tablet  0  . oxyCODONE (OXY IR/ROXICODONE) 5 MG immediate release tablet Take 1 tablet (5 mg total) by mouth every 4 (four) hours as needed for pain.  60 tablet  0  . rosuvastatin (CRESTOR) 10 MG tablet Take 10 mg by mouth at bedtime.      . vardenafil (LEVITRA) 20 MG tablet Take 1 tablet (20 mg total) by mouth daily as needed for erectile dysfunction.  10 tablet  0  . [DISCONTINUED] diltiazem (CARDIZEM) 30 MG tablet Take 30 mg by mouth 4 (four) times daily. Ask patient to verify dosage and frequency. Not indicated on medical history form dated 10/23/10.       . [DISCONTINUED] insulin lispro (HUMALOG) 100 UNIT/ML injection Inject into the skin 3 (three) times daily before meals. Ask patient to verify name/dosage/. History form noted dosage of 34 units during day and 22 units at night.         No Known Allergies  Family History  Problem  Relation Age of Onset  . Diabetes Mother   . Hypertension Mother   . Hyperlipidemia Mother     History   Social History  . Marital Status: Married    Spouse Name: N/A    Number of Children: N/A  . Years of Education: 10   Occupational History  . [employer]    Social History Main Topics  . Smoking status: Current Every Day Smoker -- 1.0 packs/day    Types: Cigarettes  . Smokeless tobacco: Never Used  . Alcohol Use: No  . Drug Use: No  . Sexually Active: Not on file   Other Topics Concern  . Not on file   Social History Narrative   Regular exercise-yesCaffeine Use-no    ROS:  Constitutional: Denies fever, malaise, fatigue, headache or abrupt weight changes.  HEENT: Denies eye pain, eye redness, ear pain, ringing in the ears, wax buildup, runny nose, nasal congestion, bloody nose, or sore throat. Respiratory: Denies difficulty breathing, shortness of breath, cough or sputum production.   Cardiovascular: Denies chest pain, chest tightness, palpitations or swelling in the hands or feet.  Gastrointestinal: Denies abdominal pain, bloating, constipation, diarrhea or blood in the stool.  GU: Denies frequency, urgency,  pain with urination, blood in urine, odor or discharge. Musculoskeletal: Pt reports left elbow swelling and pain. Denies decrease in range of motion, difficulty with gait, muscle pain.  Skin: Denies redness, rashes, lesions or ulcercations.  Neurological: Denies dizziness, difficulty with memory, difficulty with speech or problems with balance and coordination.   No other specific complaints in a complete review of systems (except as listed in HPI above).  PE:  BP 152/88  Pulse 88  Temp 98.5 F (36.9 C) (Oral)  Ht 5\' 8"  (1.727 m)  Wt 141 lb (63.957 kg)  BMI 21.44 kg/m2  SpO2 97% Wt Readings from Last 3 Encounters:  07/17/12 141 lb (63.957 kg)  03/28/12 139 lb 12.8 oz (63.413 kg)  03/08/12 138 lb 4.8 oz (62.732 kg)    General: Appears his stated age,  well developed, well nourished in NAD. HEENT: Head: normal shape and size; Eyes: sclera white, no icterus, conjunctiva pink, PERRLA and EOMs intact; Ears: Tm's gray and intact, normal light reflex; Nose: mucosa pink and moist, septum midline; Throat/Mouth: Teeth present, mucosa pink and moist, no lesions or ulcerations noted.  Neck: Normal range of motion. Neck supple, trachea midline. No massses, lumps or thyromegaly present.  Cardiovascular: Normal rate and rhythm. S1,S2 noted.  No murmur, rubs or gallops noted. No JVD or BLE edema. No carotid bruits noted. AF fistula left arm, intact, good bruit, thrill and pulse. Pulmonary/Chest: Normal effort and positive vesicular breath sounds. No respiratory distress. No wheezes, rales or ronchi noted.  Abdomen: Soft and nontender. Normal bowel sounds, no bruits noted. No distention or masses noted. Liver, spleen and kidneys non palpable. Pd catheter in place, site clean dry and intact. Musculoskeletal: Normal range of motion. No signs of joint swelling. No difficulty with gait.  Neurological: Alert and oriented. Cranial nerves II-XII intact. Coordination normal. +DTRs bilaterally. Psychiatric: Mood and affect normal. Behavior is normal. Judgment and thought content normal.     BMET    Component Value Date/Time   NA 140 03/13/2012 1119   K 4.1 03/13/2012 1119   CL 98 03/08/2012 1144   CO2 25 03/08/2012 1144   GLUCOSE 162* 03/13/2012 1119   BUN 39* 03/08/2012 1144   CREATININE 6.80* 03/08/2012 1144   CALCIUM 9.1 03/08/2012 1144   CALCIUM 8.2* 09/17/2010 2201   GFRNONAA 9* 03/08/2012 1144   GFRAA 11* 03/08/2012 1144    Lipid Panel     Component Value Date/Time   CHOL 185 09/17/2010 2201   TRIG 134 09/17/2010 2201   HDL 51 09/17/2010 2201   CHOLHDL 3.6 Ratio 09/17/2010 2201   VLDL 27 09/17/2010 2201   LDLCALC 107* 09/17/2010 2201    CBC    Component Value Date/Time   WBC 11.9* 03/08/2012 1144   RBC 4.35 03/08/2012 1144   HGB 13.6 03/13/2012 1119   HCT 40.0 03/13/2012  1119   PLT 277 03/08/2012 1144   MCV 88.7 03/08/2012 1144   MCH 29.7 03/08/2012 1144   MCHC 33.4 03/08/2012 1144   RDW 14.5 03/08/2012 1144   LYMPHSABS 1.2 01/06/2012 1433   MONOABS 0.8 01/06/2012 1433   EOSABS 0.2 01/06/2012 1433   BASOSABS 0.0 01/06/2012 1433    Hgb A1C Lab Results  Component Value Date   HGBA1C 5.8* 01/06/2012     Assessment and Plan:  Preventative Health Maintenance:  Will obtain basic screening labs today. Pt flu shot and Tetanus UTD Does not regularly see eye doctor or dentist Refilled Mobic and oxycodone. Viagra switched to Levitra  due to insurance coverage. Left elbow may need to be drained.  RTC in 3 months or sooner if needed

## 2012-07-17 NOTE — Telephone Encounter (Signed)
Lab called with critical labs on pt-BUN-58, Creatinine 10.5, GFR 5.8-informed JWJ in RB's absence of critical labs.

## 2012-07-18 ENCOUNTER — Other Ambulatory Visit: Payer: Self-pay | Admitting: *Deleted

## 2012-07-18 DIAGNOSIS — N186 End stage renal disease: Secondary | ICD-10-CM | POA: Diagnosis not present

## 2012-07-18 DIAGNOSIS — D509 Iron deficiency anemia, unspecified: Secondary | ICD-10-CM | POA: Diagnosis not present

## 2012-07-18 DIAGNOSIS — N2581 Secondary hyperparathyroidism of renal origin: Secondary | ICD-10-CM | POA: Diagnosis not present

## 2012-07-18 DIAGNOSIS — Z299 Encounter for prophylactic measures, unspecified: Secondary | ICD-10-CM | POA: Diagnosis not present

## 2012-07-18 DIAGNOSIS — Z992 Dependence on renal dialysis: Secondary | ICD-10-CM | POA: Diagnosis not present

## 2012-07-18 LAB — HEMOGLOBIN A1C: Hgb A1c MFr Bld: 8 % — ABNORMAL HIGH (ref 4.6–6.5)

## 2012-07-18 MED ORDER — GLUCOSE BLOOD VI STRP: ORAL_STRIP | Status: DC

## 2012-07-18 NOTE — Telephone Encounter (Signed)
Pt needs rx for One Touch Ultra test strips sent to [facility]. Rx sent, pt informed.

## 2012-07-19 ENCOUNTER — Encounter: Payer: Self-pay | Admitting: Internal Medicine

## 2012-07-19 DIAGNOSIS — Z299 Encounter for prophylactic measures, unspecified: Secondary | ICD-10-CM | POA: Diagnosis not present

## 2012-07-19 DIAGNOSIS — D509 Iron deficiency anemia, unspecified: Secondary | ICD-10-CM | POA: Diagnosis not present

## 2012-07-19 DIAGNOSIS — Z992 Dependence on renal dialysis: Secondary | ICD-10-CM | POA: Diagnosis not present

## 2012-07-19 DIAGNOSIS — N186 End stage renal disease: Secondary | ICD-10-CM | POA: Diagnosis not present

## 2012-07-19 DIAGNOSIS — N2581 Secondary hyperparathyroidism of renal origin: Secondary | ICD-10-CM | POA: Diagnosis not present

## 2012-07-20 DIAGNOSIS — N186 End stage renal disease: Secondary | ICD-10-CM | POA: Diagnosis not present

## 2012-07-20 DIAGNOSIS — N2581 Secondary hyperparathyroidism of renal origin: Secondary | ICD-10-CM | POA: Diagnosis not present

## 2012-07-20 DIAGNOSIS — D509 Iron deficiency anemia, unspecified: Secondary | ICD-10-CM | POA: Diagnosis not present

## 2012-07-20 DIAGNOSIS — Z299 Encounter for prophylactic measures, unspecified: Secondary | ICD-10-CM | POA: Diagnosis not present

## 2012-07-20 DIAGNOSIS — Z992 Dependence on renal dialysis: Secondary | ICD-10-CM | POA: Diagnosis not present

## 2012-07-21 DIAGNOSIS — Z299 Encounter for prophylactic measures, unspecified: Secondary | ICD-10-CM | POA: Diagnosis not present

## 2012-07-21 DIAGNOSIS — N2581 Secondary hyperparathyroidism of renal origin: Secondary | ICD-10-CM | POA: Diagnosis not present

## 2012-07-21 DIAGNOSIS — Z992 Dependence on renal dialysis: Secondary | ICD-10-CM | POA: Diagnosis not present

## 2012-07-21 DIAGNOSIS — D509 Iron deficiency anemia, unspecified: Secondary | ICD-10-CM | POA: Diagnosis not present

## 2012-07-21 DIAGNOSIS — N186 End stage renal disease: Secondary | ICD-10-CM | POA: Diagnosis not present

## 2012-07-22 DIAGNOSIS — Z299 Encounter for prophylactic measures, unspecified: Secondary | ICD-10-CM | POA: Diagnosis not present

## 2012-07-22 DIAGNOSIS — D509 Iron deficiency anemia, unspecified: Secondary | ICD-10-CM | POA: Diagnosis not present

## 2012-07-22 DIAGNOSIS — Z992 Dependence on renal dialysis: Secondary | ICD-10-CM | POA: Diagnosis not present

## 2012-07-22 DIAGNOSIS — N186 End stage renal disease: Secondary | ICD-10-CM | POA: Diagnosis not present

## 2012-07-22 DIAGNOSIS — N2581 Secondary hyperparathyroidism of renal origin: Secondary | ICD-10-CM | POA: Diagnosis not present

## 2012-07-23 DIAGNOSIS — Z992 Dependence on renal dialysis: Secondary | ICD-10-CM | POA: Diagnosis not present

## 2012-07-23 DIAGNOSIS — Z299 Encounter for prophylactic measures, unspecified: Secondary | ICD-10-CM | POA: Diagnosis not present

## 2012-07-23 DIAGNOSIS — N2581 Secondary hyperparathyroidism of renal origin: Secondary | ICD-10-CM | POA: Diagnosis not present

## 2012-07-23 DIAGNOSIS — N186 End stage renal disease: Secondary | ICD-10-CM | POA: Diagnosis not present

## 2012-07-23 DIAGNOSIS — D509 Iron deficiency anemia, unspecified: Secondary | ICD-10-CM | POA: Diagnosis not present

## 2012-07-24 DIAGNOSIS — N186 End stage renal disease: Secondary | ICD-10-CM | POA: Diagnosis not present

## 2012-07-24 DIAGNOSIS — Z299 Encounter for prophylactic measures, unspecified: Secondary | ICD-10-CM | POA: Diagnosis not present

## 2012-07-24 DIAGNOSIS — N2581 Secondary hyperparathyroidism of renal origin: Secondary | ICD-10-CM | POA: Diagnosis not present

## 2012-07-24 DIAGNOSIS — D509 Iron deficiency anemia, unspecified: Secondary | ICD-10-CM | POA: Diagnosis not present

## 2012-07-24 DIAGNOSIS — Z992 Dependence on renal dialysis: Secondary | ICD-10-CM | POA: Diagnosis not present

## 2012-07-25 DIAGNOSIS — Z299 Encounter for prophylactic measures, unspecified: Secondary | ICD-10-CM | POA: Diagnosis not present

## 2012-07-25 DIAGNOSIS — D509 Iron deficiency anemia, unspecified: Secondary | ICD-10-CM | POA: Diagnosis not present

## 2012-07-25 DIAGNOSIS — N186 End stage renal disease: Secondary | ICD-10-CM | POA: Diagnosis not present

## 2012-07-25 DIAGNOSIS — N2581 Secondary hyperparathyroidism of renal origin: Secondary | ICD-10-CM | POA: Diagnosis not present

## 2012-07-25 DIAGNOSIS — Z992 Dependence on renal dialysis: Secondary | ICD-10-CM | POA: Diagnosis not present

## 2012-07-26 DIAGNOSIS — D509 Iron deficiency anemia, unspecified: Secondary | ICD-10-CM | POA: Diagnosis not present

## 2012-07-26 DIAGNOSIS — Z992 Dependence on renal dialysis: Secondary | ICD-10-CM | POA: Diagnosis not present

## 2012-07-26 DIAGNOSIS — Z299 Encounter for prophylactic measures, unspecified: Secondary | ICD-10-CM | POA: Diagnosis not present

## 2012-07-26 DIAGNOSIS — N2581 Secondary hyperparathyroidism of renal origin: Secondary | ICD-10-CM | POA: Diagnosis not present

## 2012-07-26 DIAGNOSIS — N186 End stage renal disease: Secondary | ICD-10-CM | POA: Diagnosis not present

## 2012-07-27 DIAGNOSIS — Z299 Encounter for prophylactic measures, unspecified: Secondary | ICD-10-CM | POA: Diagnosis not present

## 2012-07-27 DIAGNOSIS — N2581 Secondary hyperparathyroidism of renal origin: Secondary | ICD-10-CM | POA: Diagnosis not present

## 2012-07-27 DIAGNOSIS — D509 Iron deficiency anemia, unspecified: Secondary | ICD-10-CM | POA: Diagnosis not present

## 2012-07-27 DIAGNOSIS — Z992 Dependence on renal dialysis: Secondary | ICD-10-CM | POA: Diagnosis not present

## 2012-07-27 DIAGNOSIS — N186 End stage renal disease: Secondary | ICD-10-CM | POA: Diagnosis not present

## 2012-07-28 DIAGNOSIS — Z992 Dependence on renal dialysis: Secondary | ICD-10-CM | POA: Diagnosis not present

## 2012-07-28 DIAGNOSIS — N2581 Secondary hyperparathyroidism of renal origin: Secondary | ICD-10-CM | POA: Diagnosis not present

## 2012-07-28 DIAGNOSIS — N186 End stage renal disease: Secondary | ICD-10-CM | POA: Diagnosis not present

## 2012-07-28 DIAGNOSIS — Z299 Encounter for prophylactic measures, unspecified: Secondary | ICD-10-CM | POA: Diagnosis not present

## 2012-07-28 DIAGNOSIS — D509 Iron deficiency anemia, unspecified: Secondary | ICD-10-CM | POA: Diagnosis not present

## 2012-07-29 DIAGNOSIS — D509 Iron deficiency anemia, unspecified: Secondary | ICD-10-CM | POA: Diagnosis not present

## 2012-07-29 DIAGNOSIS — N186 End stage renal disease: Secondary | ICD-10-CM | POA: Diagnosis not present

## 2012-07-29 DIAGNOSIS — Z992 Dependence on renal dialysis: Secondary | ICD-10-CM | POA: Diagnosis not present

## 2012-07-29 DIAGNOSIS — N2581 Secondary hyperparathyroidism of renal origin: Secondary | ICD-10-CM | POA: Diagnosis not present

## 2012-07-29 DIAGNOSIS — Z299 Encounter for prophylactic measures, unspecified: Secondary | ICD-10-CM | POA: Diagnosis not present

## 2012-07-30 DIAGNOSIS — Z299 Encounter for prophylactic measures, unspecified: Secondary | ICD-10-CM | POA: Diagnosis not present

## 2012-07-30 DIAGNOSIS — D509 Iron deficiency anemia, unspecified: Secondary | ICD-10-CM | POA: Diagnosis not present

## 2012-07-30 DIAGNOSIS — N186 End stage renal disease: Secondary | ICD-10-CM | POA: Diagnosis not present

## 2012-07-30 DIAGNOSIS — Z992 Dependence on renal dialysis: Secondary | ICD-10-CM | POA: Diagnosis not present

## 2012-07-30 DIAGNOSIS — N2581 Secondary hyperparathyroidism of renal origin: Secondary | ICD-10-CM | POA: Diagnosis not present

## 2012-07-31 ENCOUNTER — Ambulatory Visit (INDEPENDENT_AMBULATORY_CARE_PROVIDER_SITE_OTHER): Payer: Medicare Other | Admitting: Internal Medicine

## 2012-07-31 ENCOUNTER — Encounter: Payer: Self-pay | Admitting: Internal Medicine

## 2012-07-31 VITALS — BP 138/90 | HR 85 | Temp 98.0°F | Ht 68.0 in | Wt 146.5 lb

## 2012-07-31 DIAGNOSIS — M7020 Olecranon bursitis, unspecified elbow: Secondary | ICD-10-CM | POA: Diagnosis not present

## 2012-07-31 DIAGNOSIS — N186 End stage renal disease: Secondary | ICD-10-CM | POA: Diagnosis not present

## 2012-07-31 DIAGNOSIS — N2581 Secondary hyperparathyroidism of renal origin: Secondary | ICD-10-CM | POA: Diagnosis not present

## 2012-07-31 DIAGNOSIS — D509 Iron deficiency anemia, unspecified: Secondary | ICD-10-CM | POA: Diagnosis not present

## 2012-07-31 DIAGNOSIS — Z992 Dependence on renal dialysis: Secondary | ICD-10-CM | POA: Diagnosis not present

## 2012-07-31 DIAGNOSIS — Z299 Encounter for prophylactic measures, unspecified: Secondary | ICD-10-CM | POA: Diagnosis not present

## 2012-07-31 NOTE — Progress Notes (Signed)
Subjective:    Patient ID: Mark Miranda, male    DOB: May 09, 1971, 41 y.o.   MRN: [id number]  HPI  Pt presents to the clinic today with c/o left elbow swelling. This started before 07/17/2012. He was given some prednisone to see if the swelling would go down. This did not really help. He is here to see if the fluid can be drained.  Review of Systems      Past Medical History  Diagnosis Date  . Diabetes mellitus   . Renal disorder   . Hypertension   . Hypercholesterolemia   . Depression   . Migraines     Current Outpatient Prescriptions  Medication Sig Dispense Refill  . amLODipine (NORVASC) 5 MG tablet Take 5 mg by mouth daily.      . calcium carbonate (TUMS - DOSED IN MG ELEMENTAL CALCIUM) 500 MG chewable tablet Chew 1 tablet by mouth 3 (three) times daily.      Marland Kitchen diltiazem (CARDIZEM CD) 120 MG 24 hr capsule Take 120 mg by mouth daily.       Marland Kitchen gabapentin (NEURONTIN) 300 MG capsule Take 300 mg by mouth at bedtime.      Marland Kitchen glucose blood (ONE TOUCH ULTRA TEST) test strip Use as instructed to test blood sugar 4 times daily dx 250.43  120 each  5  . insulin aspart protamine-insulin aspart (NOVOLOG 70/30) (70-30) 100 UNIT/ML injection Inject 30 units in the morning, and inject 20 units at night.      . meloxicam (MOBIC) 15 MG tablet Take 1 tablet (15 mg total) by mouth daily.  30 tablet  0  . oxyCODONE (OXY IR/ROXICODONE) 5 MG immediate release tablet Take 1 tablet (5 mg total) by mouth every 4 (four) hours as needed for pain.  60 tablet  0  . rosuvastatin (CRESTOR) 10 MG tablet Take 10 mg by mouth at bedtime.      . vardenafil (LEVITRA) 20 MG tablet Take 1 tablet (20 mg total) by mouth daily as needed for erectile dysfunction.  10 tablet  0  . [DISCONTINUED] diltiazem (CARDIZEM) 30 MG tablet Take 30 mg by mouth 4 (four) times daily. Ask patient to verify dosage and frequency. Not indicated on medical history form dated 10/23/10.       . [DISCONTINUED] insulin lispro (HUMALOG) 100  UNIT/ML injection Inject into the skin 3 (three) times daily before meals. Ask patient to verify name/dosage/. History form noted dosage of 34 units during day and 22 units at night.         No Known Allergies  Family History  Problem Relation Age of Onset  . Diabetes Mother   . Hypertension Mother   . Hyperlipidemia Mother     History   Social History  . Marital Status: Married    Spouse Name: N/A    Number of Children: N/A  . Years of Education: 10   Occupational History  . [employer]    Social History Main Topics  . Smoking status: Current Every Day Smoker -- 1.0 packs/day    Types: Cigarettes  . Smokeless tobacco: Never Used  . Alcohol Use: No  . Drug Use: No  . Sexually Active: Not on file   Other Topics Concern  . Not on file   Social History Narrative   Regular exercise-yesCaffeine Use-no     Constitutional: Denies fever, malaise, fatigue, headache or abrupt weight changes.  Musculoskeletal: Pt reports swelling and pain of the left elbow. Denies decrease in range of  motion, difficulty with gait, muscle pain.  Skin: Denies redness, rashes, lesions or ulcercations.    No other specific complaints in a complete review of systems (except as listed in HPI above).  Objective:   Physical Exam   BP 138/90  Pulse 85  Temp 98 F (36.7 C) (Oral)  Ht 5\' 8"  (1.727 m)  Wt 146 lb 8 oz (66.452 kg)  BMI 22.28 kg/m2  SpO2 99% Wt Readings from Last 3 Encounters:  07/31/12 146 lb 8 oz (66.452 kg)  07/17/12 141 lb (63.957 kg)  03/28/12 139 lb 12.8 oz (63.413 kg)    General: Appears his stated age, well developed, well nourished in NAD. Skin: Warm, dry and intact. No rashes, lesions or ulcerations noted. Cardiovascular: Normal rate and rhythm. S1,S2 noted.  No murmur, rubs or gallops noted. No JVD or BLE edema. No carotid bruits noted. Pulmonary/Chest: Normal effort and positive vesicular breath sounds. No respiratory distress. No wheezes, rales or ronchi noted.   Musculoskeletal:  Large effusion to the left elbow. Normal range of motion. No signs of joint swelling. No difficulty with gait.        Assessment & Plan:   Olecran Bursitis of the left elbow:  Reassurance given Pt informed that fluid may recollect  Procedure Note:  Olecran Bursitis fluid removal  The patient elects to proceed after verbal consent is obtained. The patient was informed of possible risks and complications prior to procedure. Using sterile technique throughout, patient is injected with 25 gauge needle with approximaetly 5 ml of fluid aspirated. The patient tolerated the procedure well. Ice 24-48h, then use heat thereafter as needed. Aftercare instructions provided.  RTC as needed or if symptoms persist

## 2012-07-31 NOTE — Patient Instructions (Signed)
Olecranon Bursitis Bursitis is swelling and soreness (inflammation) of a fluid-filled sac (bursa) that covers and protects a joint. Olecranon bursitis occurs over the elbow.  CAUSES Bursitis can be caused by injury, overuse of the joint, arthritis, or infection.  SYMPTOMS   Tenderness, swelling, warmth, or redness over the elbow.  Elbow pain with movement. This is greater with bending the elbow.  Squeaking sound when the bursa is rubbed or moved.  Increasing size of the bursa without pain or discomfort.  Fever with increasing pain and swelling if the bursa becomes infected. HOME CARE INSTRUCTIONS   Put ice on the affected area.  Put ice in a plastic bag.  Place a towel between your skin and the bag.  Leave the ice on for 15 to 20 minutes each hour while awake. Do this for the first 2 days.  When resting, elevate your elbow above the level of your heart. This helps reduce swelling.  Continue to put the joint through a full range of motion 4 times per day. Rest the injured joint at other times. When the pain lessens, begin normal slow movements and usual activities.  Only take over-the-counter or prescription medicines for pain, discomfort, or fever as directed by your caregiver.  Reduce your intake of milk and related dairy products (cheese, yogurt). They may make your condition worse. SEEK IMMEDIATE MEDICAL CARE IF:   Your pain increases even during treatment.  You have a fever.  You have heat and inflammation over the bursa and elbow.  You have a red line that goes up your arm.  You have pain with movement of your elbow. MAKE SURE YOU:   Understand these instructions.  Will watch your condition.  Will get help right away if you are not doing well or get worse. Document Released: 08/24/2006 Document Revised: 10/17/2011 Document Reviewed: 07/10/2007 [employer] Patient Information 2013 [employer].  

## 2012-08-01 DIAGNOSIS — N186 End stage renal disease: Secondary | ICD-10-CM | POA: Diagnosis not present

## 2012-08-01 DIAGNOSIS — N2581 Secondary hyperparathyroidism of renal origin: Secondary | ICD-10-CM | POA: Diagnosis not present

## 2012-08-01 DIAGNOSIS — Z992 Dependence on renal dialysis: Secondary | ICD-10-CM | POA: Diagnosis not present

## 2012-08-01 DIAGNOSIS — D509 Iron deficiency anemia, unspecified: Secondary | ICD-10-CM | POA: Diagnosis not present

## 2012-08-01 DIAGNOSIS — Z299 Encounter for prophylactic measures, unspecified: Secondary | ICD-10-CM | POA: Diagnosis not present

## 2012-08-02 DIAGNOSIS — N186 End stage renal disease: Secondary | ICD-10-CM | POA: Diagnosis not present

## 2012-08-02 DIAGNOSIS — Z299 Encounter for prophylactic measures, unspecified: Secondary | ICD-10-CM | POA: Diagnosis not present

## 2012-08-02 DIAGNOSIS — Z992 Dependence on renal dialysis: Secondary | ICD-10-CM | POA: Diagnosis not present

## 2012-08-02 DIAGNOSIS — D509 Iron deficiency anemia, unspecified: Secondary | ICD-10-CM | POA: Diagnosis not present

## 2012-08-02 DIAGNOSIS — N2581 Secondary hyperparathyroidism of renal origin: Secondary | ICD-10-CM | POA: Diagnosis not present

## 2012-08-03 DIAGNOSIS — Z299 Encounter for prophylactic measures, unspecified: Secondary | ICD-10-CM | POA: Diagnosis not present

## 2012-08-03 DIAGNOSIS — N2581 Secondary hyperparathyroidism of renal origin: Secondary | ICD-10-CM | POA: Diagnosis not present

## 2012-08-03 DIAGNOSIS — N186 End stage renal disease: Secondary | ICD-10-CM | POA: Diagnosis not present

## 2012-08-03 DIAGNOSIS — Z992 Dependence on renal dialysis: Secondary | ICD-10-CM | POA: Diagnosis not present

## 2012-08-03 DIAGNOSIS — D509 Iron deficiency anemia, unspecified: Secondary | ICD-10-CM | POA: Diagnosis not present

## 2012-08-04 DIAGNOSIS — Z992 Dependence on renal dialysis: Secondary | ICD-10-CM | POA: Diagnosis not present

## 2012-08-04 DIAGNOSIS — Z299 Encounter for prophylactic measures, unspecified: Secondary | ICD-10-CM | POA: Diagnosis not present

## 2012-08-04 DIAGNOSIS — D509 Iron deficiency anemia, unspecified: Secondary | ICD-10-CM | POA: Diagnosis not present

## 2012-08-04 DIAGNOSIS — N2581 Secondary hyperparathyroidism of renal origin: Secondary | ICD-10-CM | POA: Diagnosis not present

## 2012-08-04 DIAGNOSIS — N186 End stage renal disease: Secondary | ICD-10-CM | POA: Diagnosis not present

## 2012-08-05 DIAGNOSIS — D509 Iron deficiency anemia, unspecified: Secondary | ICD-10-CM | POA: Diagnosis not present

## 2012-08-05 DIAGNOSIS — N186 End stage renal disease: Secondary | ICD-10-CM | POA: Diagnosis not present

## 2012-08-05 DIAGNOSIS — Z299 Encounter for prophylactic measures, unspecified: Secondary | ICD-10-CM | POA: Diagnosis not present

## 2012-08-05 DIAGNOSIS — N2581 Secondary hyperparathyroidism of renal origin: Secondary | ICD-10-CM | POA: Diagnosis not present

## 2012-08-05 DIAGNOSIS — Z992 Dependence on renal dialysis: Secondary | ICD-10-CM | POA: Diagnosis not present

## 2012-08-06 DIAGNOSIS — N186 End stage renal disease: Secondary | ICD-10-CM | POA: Diagnosis not present

## 2012-08-06 DIAGNOSIS — N2581 Secondary hyperparathyroidism of renal origin: Secondary | ICD-10-CM | POA: Diagnosis not present

## 2012-08-06 DIAGNOSIS — D509 Iron deficiency anemia, unspecified: Secondary | ICD-10-CM | POA: Diagnosis not present

## 2012-08-06 DIAGNOSIS — Z992 Dependence on renal dialysis: Secondary | ICD-10-CM | POA: Diagnosis not present

## 2012-08-06 DIAGNOSIS — Z299 Encounter for prophylactic measures, unspecified: Secondary | ICD-10-CM | POA: Diagnosis not present

## 2012-08-07 DIAGNOSIS — N2581 Secondary hyperparathyroidism of renal origin: Secondary | ICD-10-CM | POA: Diagnosis not present

## 2012-08-07 DIAGNOSIS — N186 End stage renal disease: Secondary | ICD-10-CM | POA: Diagnosis not present

## 2012-08-07 DIAGNOSIS — Z299 Encounter for prophylactic measures, unspecified: Secondary | ICD-10-CM | POA: Diagnosis not present

## 2012-08-07 DIAGNOSIS — Z992 Dependence on renal dialysis: Secondary | ICD-10-CM | POA: Diagnosis not present

## 2012-08-07 DIAGNOSIS — D509 Iron deficiency anemia, unspecified: Secondary | ICD-10-CM | POA: Diagnosis not present

## 2012-08-08 DIAGNOSIS — Z992 Dependence on renal dialysis: Secondary | ICD-10-CM | POA: Diagnosis not present

## 2012-08-08 DIAGNOSIS — N186 End stage renal disease: Secondary | ICD-10-CM | POA: Diagnosis not present

## 2012-08-15 ENCOUNTER — Ambulatory Visit (INDEPENDENT_AMBULATORY_CARE_PROVIDER_SITE_OTHER): Payer: Medicare Other | Admitting: Internal Medicine

## 2012-08-15 ENCOUNTER — Encounter: Payer: Self-pay | Admitting: Internal Medicine

## 2012-08-15 VITALS — BP 158/88 | HR 86 | Temp 98.4°F | Ht 68.0 in | Wt 150.6 lb

## 2012-08-15 DIAGNOSIS — E1029 Type 1 diabetes mellitus with other diabetic kidney complication: Secondary | ICD-10-CM | POA: Diagnosis not present

## 2012-08-15 DIAGNOSIS — M25429 Effusion, unspecified elbow: Secondary | ICD-10-CM | POA: Diagnosis not present

## 2012-08-15 DIAGNOSIS — M549 Dorsalgia, unspecified: Secondary | ICD-10-CM | POA: Diagnosis not present

## 2012-08-15 DIAGNOSIS — N186 End stage renal disease: Secondary | ICD-10-CM | POA: Diagnosis not present

## 2012-08-15 DIAGNOSIS — M25422 Effusion, left elbow: Secondary | ICD-10-CM

## 2012-08-15 MED ORDER — OXYCODONE HCL 5 MG PO TABS: 5.0000 mg | ORAL_TABLET | ORAL | Status: DC | PRN

## 2012-08-15 NOTE — Patient Instructions (Signed)
Elbow Effusion You have an elbow injury with an effusion. This means there is blood or other fluid in the elbow joint. Both fractures and sprains of the elbow cab cause an effusion with swelling and pain. X-rays often show this swelling around the joint, but they may not show a fracture. The treatment for elbow sprains and minor fractures is to reduce swelling and pain. It rests the joint until movement is painless. Repeating the x-ray study in 1-2 weeks may show a minor fracture of the radius bone that was not visible on the initial x-rays. Most of the time a splint or sling is used for the first days or week after the injury. Apply ice packs to the elbow for 20-30 minutes every 2 hours for the next 2-3 days. Keep your elbow elevated above the level of your heart as much as possible until the pain and swelling are better. An elastic wrap may also be used to reduce swelling. Call your caregiver for follow-up care within one week.   The major issue with this condition is loss of elbow motion. In general, your caregiver will start you on motion exercises and may have you follow-up with a physical or hand therapist. SEEK MEDICAL CARE IF:    You develop a numb, cold, or pale forearm or hand.  Document Released: 09/01/2004 Document Revised: 10/17/2011 Document Reviewed: 01/20/2009 [facility] Patient Information 2013 [place], [place].

## 2012-08-15 NOTE — Progress Notes (Signed)
Subjective:    Patient ID: Mark Miranda, male    DOB: Jul 28, 1971, 42 y.o.   MRN: [id number]  HPI   Pt presents to the clinic today with c/o left elbow swelling. This started before 07/17/2012. He was given some prednisone to see if the swelling would go down. This did not really help. He was seen on 07/31/2012 and the fluid was drained from the elbow. It returned within 2 weeks.  Review of Systems       Past Medical History  Diagnosis Date  . Diabetes mellitus   . Renal disorder   . Hypertension   . Hypercholesterolemia   . Depression   . Migraines     Current Outpatient Prescriptions  Medication Sig Dispense Refill  . amLODipine (NORVASC) 5 MG tablet Take 5 mg by mouth daily.      . calcium carbonate (TUMS - DOSED IN MG ELEMENTAL CALCIUM) 500 MG chewable tablet Chew 1 tablet by mouth 3 (three) times daily.      Marland Kitchen diltiazem (CARDIZEM CD) 120 MG 24 hr capsule Take 120 mg by mouth daily.       Marland Kitchen gabapentin (NEURONTIN) 300 MG capsule Take 300 mg by mouth at bedtime.      Marland Kitchen glucose blood (ONE TOUCH ULTRA TEST) test strip Use as instructed to test blood sugar 4 times daily dx 250.43  120 each  5  . insulin aspart protamine-insulin aspart (NOVOLOG 70/30) (70-30) 100 UNIT/ML injection Inject 30 units in the morning, and inject 20 units at night.      . meloxicam (MOBIC) 15 MG tablet Take 1 tablet (15 mg total) by mouth daily.  30 tablet  0  . oxyCODONE (OXY IR/ROXICODONE) 5 MG immediate release tablet Take 1 tablet (5 mg total) by mouth every 4 (four) hours as needed for pain.  60 tablet  0  . rosuvastatin (CRESTOR) 10 MG tablet Take 10 mg by mouth at bedtime.      . [DISCONTINUED] diltiazem (CARDIZEM) 30 MG tablet Take 30 mg by mouth 4 (four) times daily. Ask patient to verify dosage and frequency. Not indicated on medical history form dated 10/23/10.       . [DISCONTINUED] insulin lispro (HUMALOG) 100 UNIT/ML injection Inject into the skin 3 (three) times daily before meals. Ask  patient to verify name/dosage/. History form noted dosage of 34 units during day and 22 units at night.         No Known Allergies  Family History  Problem Relation Age of Onset  . Diabetes Mother   . Hypertension Mother   . Hyperlipidemia Mother     History   Social History  . Marital Status: Married    Spouse Name: N/A    Number of Children: N/A  . Years of Education: 10   Occupational History  . [employer]    Social History Main Topics  . Smoking status: Current Every Day Smoker -- 1.0 packs/day    Types: Cigarettes  . Smokeless tobacco: Never Used  . Alcohol Use: No  . Drug Use: No  . Sexually Active: Not on file   Other Topics Concern  . Not on file   Social History Narrative   Regular exercise-yesCaffeine Use-no     Constitutional: Denies fever, malaise, fatigue, headache or abrupt weight changes.  Musculoskeletal: Pt reports swelling and pain of the left elbow. Denies decrease in range of motion, difficulty with gait, muscle pain.  Skin: Denies redness, rashes, lesions or ulcercations.  No other specific complaints in a complete review of systems (except as listed in HPI above).  Objective:   Physical Exam    BP 158/88  Pulse 86  Temp 98.4 F (36.9 C) (Oral)  Ht 5\' 8"  (1.727 m)  Wt 150 lb 9.6 oz (68.312 kg)  BMI 22.90 kg/m2  SpO2 97% Wt Readings from Last 3 Encounters:  08/15/12 150 lb 9.6 oz (68.312 kg)  07/31/12 146 lb 8 oz (66.452 kg)  07/17/12 141 lb (63.957 kg)    General: Appears his stated age, well developed, well nourished in NAD. Skin: Warm, dry and intact. No rashes, lesions or ulcerations noted. Cardiovascular: Normal rate and rhythm. S1,S2 noted.  No murmur, rubs or gallops noted. No JVD or BLE edema. No carotid bruits noted. Pulmonary/Chest: Normal effort and positive vesicular breath sounds. No respiratory distress. No wheezes, rales or ronchi noted.  Musculoskeletal:  Large effusion to the left elbow. Normal range of motion.  No signs of joint swelling. No difficulty with gait.        Assessment & Plan:   Left elbow effusion:  Reassurance given Pt informed that fluid may recollect Referral to orthopedics for further evaluation and management Will refill oxycodone this time, no more refills until after evaluation from ortho  RTC as needed or if symptoms persist

## 2012-08-22 DIAGNOSIS — M7020 Olecranon bursitis, unspecified elbow: Secondary | ICD-10-CM | POA: Diagnosis not present

## 2012-08-22 DIAGNOSIS — M25529 Pain in unspecified elbow: Secondary | ICD-10-CM | POA: Diagnosis not present

## 2012-08-29 DIAGNOSIS — M7020 Olecranon bursitis, unspecified elbow: Secondary | ICD-10-CM | POA: Diagnosis not present

## 2012-09-07 DIAGNOSIS — N186 End stage renal disease: Secondary | ICD-10-CM | POA: Diagnosis not present

## 2012-09-08 DIAGNOSIS — N186 End stage renal disease: Secondary | ICD-10-CM | POA: Diagnosis not present

## 2012-09-08 DIAGNOSIS — Z992 Dependence on renal dialysis: Secondary | ICD-10-CM | POA: Diagnosis not present

## 2012-09-08 DIAGNOSIS — N2581 Secondary hyperparathyroidism of renal origin: Secondary | ICD-10-CM | POA: Diagnosis not present

## 2012-09-11 ENCOUNTER — Telehealth: Payer: Self-pay | Admitting: Internal Medicine

## 2012-09-11 MED ORDER — INSULIN ASPART PROT & ASPART (70-30 MIX) 100 UNIT/ML ~~LOC~~ SUSP: SUBCUTANEOUS | Status: DC

## 2012-09-11 NOTE — Telephone Encounter (Signed)
Patient needs a refill on Novolog 70/30 flex pen sent to [facility]

## 2012-09-11 NOTE — Telephone Encounter (Signed)
Valentina Gu, Ok to refill. Do you mind sending this in for me? Rene Kocher

## 2012-09-28 DIAGNOSIS — N186 End stage renal disease: Secondary | ICD-10-CM | POA: Diagnosis not present

## 2012-09-28 DIAGNOSIS — J984 Other disorders of lung: Secondary | ICD-10-CM | POA: Diagnosis not present

## 2012-10-05 DIAGNOSIS — N186 End stage renal disease: Secondary | ICD-10-CM | POA: Diagnosis not present

## 2012-10-06 DIAGNOSIS — N186 End stage renal disease: Secondary | ICD-10-CM | POA: Diagnosis not present

## 2012-10-06 DIAGNOSIS — Z992 Dependence on renal dialysis: Secondary | ICD-10-CM | POA: Diagnosis not present

## 2012-10-06 DIAGNOSIS — N2581 Secondary hyperparathyroidism of renal origin: Secondary | ICD-10-CM | POA: Diagnosis not present

## 2012-10-11 DIAGNOSIS — N186 End stage renal disease: Secondary | ICD-10-CM | POA: Diagnosis not present

## 2012-11-05 DIAGNOSIS — N186 End stage renal disease: Secondary | ICD-10-CM | POA: Diagnosis not present

## 2012-11-06 DIAGNOSIS — E1029 Type 1 diabetes mellitus with other diabetic kidney complication: Secondary | ICD-10-CM | POA: Diagnosis not present

## 2012-11-06 DIAGNOSIS — Z23 Encounter for immunization: Secondary | ICD-10-CM | POA: Diagnosis not present

## 2012-11-06 DIAGNOSIS — N186 End stage renal disease: Secondary | ICD-10-CM | POA: Diagnosis not present

## 2012-11-06 DIAGNOSIS — Z992 Dependence on renal dialysis: Secondary | ICD-10-CM | POA: Diagnosis not present

## 2012-11-07 DIAGNOSIS — Z992 Dependence on renal dialysis: Secondary | ICD-10-CM | POA: Diagnosis not present

## 2012-11-07 DIAGNOSIS — N186 End stage renal disease: Secondary | ICD-10-CM | POA: Diagnosis not present

## 2012-11-07 DIAGNOSIS — Z23 Encounter for immunization: Secondary | ICD-10-CM | POA: Diagnosis not present

## 2012-11-08 DIAGNOSIS — Z23 Encounter for immunization: Secondary | ICD-10-CM | POA: Diagnosis not present

## 2012-11-08 DIAGNOSIS — N186 End stage renal disease: Secondary | ICD-10-CM | POA: Diagnosis not present

## 2012-11-08 DIAGNOSIS — Z992 Dependence on renal dialysis: Secondary | ICD-10-CM | POA: Diagnosis not present

## 2012-11-09 DIAGNOSIS — Z23 Encounter for immunization: Secondary | ICD-10-CM | POA: Diagnosis not present

## 2012-11-09 DIAGNOSIS — N186 End stage renal disease: Secondary | ICD-10-CM | POA: Diagnosis not present

## 2012-11-09 DIAGNOSIS — Z992 Dependence on renal dialysis: Secondary | ICD-10-CM | POA: Diagnosis not present

## 2012-11-10 DIAGNOSIS — N186 End stage renal disease: Secondary | ICD-10-CM | POA: Diagnosis not present

## 2012-11-10 DIAGNOSIS — Z23 Encounter for immunization: Secondary | ICD-10-CM | POA: Diagnosis not present

## 2012-11-10 DIAGNOSIS — Z992 Dependence on renal dialysis: Secondary | ICD-10-CM | POA: Diagnosis not present

## 2012-11-11 DIAGNOSIS — Z23 Encounter for immunization: Secondary | ICD-10-CM | POA: Diagnosis not present

## 2012-11-11 DIAGNOSIS — Z992 Dependence on renal dialysis: Secondary | ICD-10-CM | POA: Diagnosis not present

## 2012-11-11 DIAGNOSIS — N186 End stage renal disease: Secondary | ICD-10-CM | POA: Diagnosis not present

## 2012-11-12 DIAGNOSIS — N186 End stage renal disease: Secondary | ICD-10-CM | POA: Diagnosis not present

## 2012-11-12 DIAGNOSIS — Z23 Encounter for immunization: Secondary | ICD-10-CM | POA: Diagnosis not present

## 2012-11-12 DIAGNOSIS — Z992 Dependence on renal dialysis: Secondary | ICD-10-CM | POA: Diagnosis not present

## 2012-11-13 DIAGNOSIS — N186 End stage renal disease: Secondary | ICD-10-CM | POA: Diagnosis not present

## 2012-11-13 DIAGNOSIS — Z23 Encounter for immunization: Secondary | ICD-10-CM | POA: Diagnosis not present

## 2012-11-13 DIAGNOSIS — Z992 Dependence on renal dialysis: Secondary | ICD-10-CM | POA: Diagnosis not present

## 2012-11-14 DIAGNOSIS — N186 End stage renal disease: Secondary | ICD-10-CM | POA: Diagnosis not present

## 2012-11-14 DIAGNOSIS — Z23 Encounter for immunization: Secondary | ICD-10-CM | POA: Diagnosis not present

## 2012-11-14 DIAGNOSIS — Z992 Dependence on renal dialysis: Secondary | ICD-10-CM | POA: Diagnosis not present

## 2012-11-15 DIAGNOSIS — Z23 Encounter for immunization: Secondary | ICD-10-CM | POA: Diagnosis not present

## 2012-11-15 DIAGNOSIS — Z992 Dependence on renal dialysis: Secondary | ICD-10-CM | POA: Diagnosis not present

## 2012-11-15 DIAGNOSIS — N186 End stage renal disease: Secondary | ICD-10-CM | POA: Diagnosis not present

## 2012-11-16 DIAGNOSIS — Z992 Dependence on renal dialysis: Secondary | ICD-10-CM | POA: Diagnosis not present

## 2012-11-16 DIAGNOSIS — Z23 Encounter for immunization: Secondary | ICD-10-CM | POA: Diagnosis not present

## 2012-11-16 DIAGNOSIS — N186 End stage renal disease: Secondary | ICD-10-CM | POA: Diagnosis not present

## 2012-11-17 DIAGNOSIS — Z992 Dependence on renal dialysis: Secondary | ICD-10-CM | POA: Diagnosis not present

## 2012-11-17 DIAGNOSIS — Z23 Encounter for immunization: Secondary | ICD-10-CM | POA: Diagnosis not present

## 2012-11-17 DIAGNOSIS — N186 End stage renal disease: Secondary | ICD-10-CM | POA: Diagnosis not present

## 2012-11-18 DIAGNOSIS — Z23 Encounter for immunization: Secondary | ICD-10-CM | POA: Diagnosis not present

## 2012-11-18 DIAGNOSIS — N186 End stage renal disease: Secondary | ICD-10-CM | POA: Diagnosis not present

## 2012-11-18 DIAGNOSIS — Z992 Dependence on renal dialysis: Secondary | ICD-10-CM | POA: Diagnosis not present

## 2012-11-19 DIAGNOSIS — N186 End stage renal disease: Secondary | ICD-10-CM | POA: Diagnosis not present

## 2012-11-19 DIAGNOSIS — Z23 Encounter for immunization: Secondary | ICD-10-CM | POA: Diagnosis not present

## 2012-11-19 DIAGNOSIS — Z992 Dependence on renal dialysis: Secondary | ICD-10-CM | POA: Diagnosis not present

## 2012-11-20 DIAGNOSIS — N186 End stage renal disease: Secondary | ICD-10-CM | POA: Diagnosis not present

## 2012-11-20 DIAGNOSIS — Z23 Encounter for immunization: Secondary | ICD-10-CM | POA: Diagnosis not present

## 2012-11-20 DIAGNOSIS — Z992 Dependence on renal dialysis: Secondary | ICD-10-CM | POA: Diagnosis not present

## 2012-11-21 DIAGNOSIS — N186 End stage renal disease: Secondary | ICD-10-CM | POA: Diagnosis not present

## 2012-11-21 DIAGNOSIS — Z992 Dependence on renal dialysis: Secondary | ICD-10-CM | POA: Diagnosis not present

## 2012-11-21 DIAGNOSIS — Z23 Encounter for immunization: Secondary | ICD-10-CM | POA: Diagnosis not present

## 2012-11-22 DIAGNOSIS — Z23 Encounter for immunization: Secondary | ICD-10-CM | POA: Diagnosis not present

## 2012-11-22 DIAGNOSIS — N186 End stage renal disease: Secondary | ICD-10-CM | POA: Diagnosis not present

## 2012-11-22 DIAGNOSIS — Z992 Dependence on renal dialysis: Secondary | ICD-10-CM | POA: Diagnosis not present

## 2012-11-23 DIAGNOSIS — Z992 Dependence on renal dialysis: Secondary | ICD-10-CM | POA: Diagnosis not present

## 2012-11-23 DIAGNOSIS — Z23 Encounter for immunization: Secondary | ICD-10-CM | POA: Diagnosis not present

## 2012-11-23 DIAGNOSIS — N186 End stage renal disease: Secondary | ICD-10-CM | POA: Diagnosis not present

## 2012-11-24 DIAGNOSIS — Z992 Dependence on renal dialysis: Secondary | ICD-10-CM | POA: Diagnosis not present

## 2012-11-24 DIAGNOSIS — Z23 Encounter for immunization: Secondary | ICD-10-CM | POA: Diagnosis not present

## 2012-11-24 DIAGNOSIS — N186 End stage renal disease: Secondary | ICD-10-CM | POA: Diagnosis not present

## 2012-11-25 DIAGNOSIS — N186 End stage renal disease: Secondary | ICD-10-CM | POA: Diagnosis not present

## 2012-11-25 DIAGNOSIS — Z23 Encounter for immunization: Secondary | ICD-10-CM | POA: Diagnosis not present

## 2012-11-25 DIAGNOSIS — Z992 Dependence on renal dialysis: Secondary | ICD-10-CM | POA: Diagnosis not present

## 2012-11-26 DIAGNOSIS — Z23 Encounter for immunization: Secondary | ICD-10-CM | POA: Diagnosis not present

## 2012-11-26 DIAGNOSIS — N186 End stage renal disease: Secondary | ICD-10-CM | POA: Diagnosis not present

## 2012-11-26 DIAGNOSIS — Z992 Dependence on renal dialysis: Secondary | ICD-10-CM | POA: Diagnosis not present

## 2012-11-27 DIAGNOSIS — Z23 Encounter for immunization: Secondary | ICD-10-CM | POA: Diagnosis not present

## 2012-11-27 DIAGNOSIS — N186 End stage renal disease: Secondary | ICD-10-CM | POA: Diagnosis not present

## 2012-11-27 DIAGNOSIS — Z992 Dependence on renal dialysis: Secondary | ICD-10-CM | POA: Diagnosis not present

## 2012-11-28 DIAGNOSIS — M7020 Olecranon bursitis, unspecified elbow: Secondary | ICD-10-CM | POA: Diagnosis not present

## 2012-11-28 DIAGNOSIS — Z992 Dependence on renal dialysis: Secondary | ICD-10-CM | POA: Diagnosis not present

## 2012-11-28 DIAGNOSIS — Z23 Encounter for immunization: Secondary | ICD-10-CM | POA: Diagnosis not present

## 2012-11-28 DIAGNOSIS — N186 End stage renal disease: Secondary | ICD-10-CM | POA: Diagnosis not present

## 2012-11-29 DIAGNOSIS — N186 End stage renal disease: Secondary | ICD-10-CM | POA: Diagnosis not present

## 2012-11-29 DIAGNOSIS — Z992 Dependence on renal dialysis: Secondary | ICD-10-CM | POA: Diagnosis not present

## 2012-11-29 DIAGNOSIS — Z23 Encounter for immunization: Secondary | ICD-10-CM | POA: Diagnosis not present

## 2012-11-30 DIAGNOSIS — Z23 Encounter for immunization: Secondary | ICD-10-CM | POA: Diagnosis not present

## 2012-11-30 DIAGNOSIS — N186 End stage renal disease: Secondary | ICD-10-CM | POA: Diagnosis not present

## 2012-11-30 DIAGNOSIS — Z992 Dependence on renal dialysis: Secondary | ICD-10-CM | POA: Diagnosis not present

## 2012-12-01 DIAGNOSIS — N186 End stage renal disease: Secondary | ICD-10-CM | POA: Diagnosis not present

## 2012-12-01 DIAGNOSIS — Z23 Encounter for immunization: Secondary | ICD-10-CM | POA: Diagnosis not present

## 2012-12-01 DIAGNOSIS — Z992 Dependence on renal dialysis: Secondary | ICD-10-CM | POA: Diagnosis not present

## 2012-12-02 DIAGNOSIS — Z23 Encounter for immunization: Secondary | ICD-10-CM | POA: Diagnosis not present

## 2012-12-02 DIAGNOSIS — N186 End stage renal disease: Secondary | ICD-10-CM | POA: Diagnosis not present

## 2012-12-02 DIAGNOSIS — Z992 Dependence on renal dialysis: Secondary | ICD-10-CM | POA: Diagnosis not present

## 2012-12-03 DIAGNOSIS — Z23 Encounter for immunization: Secondary | ICD-10-CM | POA: Diagnosis not present

## 2012-12-03 DIAGNOSIS — Z992 Dependence on renal dialysis: Secondary | ICD-10-CM | POA: Diagnosis not present

## 2012-12-03 DIAGNOSIS — N186 End stage renal disease: Secondary | ICD-10-CM | POA: Diagnosis not present

## 2012-12-04 DIAGNOSIS — Z23 Encounter for immunization: Secondary | ICD-10-CM | POA: Diagnosis not present

## 2012-12-04 DIAGNOSIS — Z992 Dependence on renal dialysis: Secondary | ICD-10-CM | POA: Diagnosis not present

## 2012-12-04 DIAGNOSIS — N186 End stage renal disease: Secondary | ICD-10-CM | POA: Diagnosis not present

## 2012-12-05 DIAGNOSIS — N186 End stage renal disease: Secondary | ICD-10-CM | POA: Diagnosis not present

## 2012-12-05 DIAGNOSIS — Z992 Dependence on renal dialysis: Secondary | ICD-10-CM | POA: Diagnosis not present

## 2012-12-05 DIAGNOSIS — Z23 Encounter for immunization: Secondary | ICD-10-CM | POA: Diagnosis not present

## 2012-12-06 DIAGNOSIS — N186 End stage renal disease: Secondary | ICD-10-CM | POA: Diagnosis not present

## 2012-12-06 DIAGNOSIS — N2581 Secondary hyperparathyroidism of renal origin: Secondary | ICD-10-CM | POA: Diagnosis not present

## 2012-12-06 DIAGNOSIS — Z992 Dependence on renal dialysis: Secondary | ICD-10-CM | POA: Diagnosis not present

## 2012-12-18 ENCOUNTER — Other Ambulatory Visit: Payer: Self-pay | Admitting: *Deleted

## 2012-12-18 ENCOUNTER — Other Ambulatory Visit: Payer: Self-pay | Admitting: Internal Medicine

## 2012-12-18 MED ORDER — GLUCOSE BLOOD VI STRP: ORAL_STRIP | Status: DC

## 2012-12-18 NOTE — Telephone Encounter (Signed)
R'cd fax from [facility] for refill of One Touch test strips.

## 2013-01-05 DIAGNOSIS — N186 End stage renal disease: Secondary | ICD-10-CM | POA: Diagnosis not present

## 2013-01-06 DIAGNOSIS — N2581 Secondary hyperparathyroidism of renal origin: Secondary | ICD-10-CM | POA: Diagnosis not present

## 2013-01-06 DIAGNOSIS — N186 End stage renal disease: Secondary | ICD-10-CM | POA: Diagnosis not present

## 2013-01-06 DIAGNOSIS — R880 Cloudy (hemodialysis) (peritoneal) dialysis effluent: Secondary | ICD-10-CM | POA: Diagnosis not present

## 2013-01-06 DIAGNOSIS — Z992 Dependence on renal dialysis: Secondary | ICD-10-CM | POA: Diagnosis not present

## 2013-01-07 DIAGNOSIS — R880 Cloudy (hemodialysis) (peritoneal) dialysis effluent: Secondary | ICD-10-CM | POA: Diagnosis not present

## 2013-01-07 DIAGNOSIS — N186 End stage renal disease: Secondary | ICD-10-CM | POA: Diagnosis not present

## 2013-01-07 DIAGNOSIS — Z992 Dependence on renal dialysis: Secondary | ICD-10-CM | POA: Diagnosis not present

## 2013-01-07 DIAGNOSIS — N2581 Secondary hyperparathyroidism of renal origin: Secondary | ICD-10-CM | POA: Diagnosis not present

## 2013-01-08 DIAGNOSIS — Z992 Dependence on renal dialysis: Secondary | ICD-10-CM | POA: Diagnosis not present

## 2013-01-08 DIAGNOSIS — R880 Cloudy (hemodialysis) (peritoneal) dialysis effluent: Secondary | ICD-10-CM | POA: Diagnosis not present

## 2013-01-08 DIAGNOSIS — N2581 Secondary hyperparathyroidism of renal origin: Secondary | ICD-10-CM | POA: Diagnosis not present

## 2013-01-08 DIAGNOSIS — N186 End stage renal disease: Secondary | ICD-10-CM | POA: Diagnosis not present

## 2013-01-09 DIAGNOSIS — R880 Cloudy (hemodialysis) (peritoneal) dialysis effluent: Secondary | ICD-10-CM | POA: Diagnosis not present

## 2013-01-09 DIAGNOSIS — Z992 Dependence on renal dialysis: Secondary | ICD-10-CM | POA: Diagnosis not present

## 2013-01-09 DIAGNOSIS — N186 End stage renal disease: Secondary | ICD-10-CM | POA: Diagnosis not present

## 2013-01-09 DIAGNOSIS — N2581 Secondary hyperparathyroidism of renal origin: Secondary | ICD-10-CM | POA: Diagnosis not present

## 2013-01-10 DIAGNOSIS — Z992 Dependence on renal dialysis: Secondary | ICD-10-CM | POA: Diagnosis not present

## 2013-01-10 DIAGNOSIS — N186 End stage renal disease: Secondary | ICD-10-CM | POA: Diagnosis not present

## 2013-01-10 DIAGNOSIS — R880 Cloudy (hemodialysis) (peritoneal) dialysis effluent: Secondary | ICD-10-CM | POA: Diagnosis not present

## 2013-01-10 DIAGNOSIS — N2581 Secondary hyperparathyroidism of renal origin: Secondary | ICD-10-CM | POA: Diagnosis not present

## 2013-01-11 DIAGNOSIS — N2581 Secondary hyperparathyroidism of renal origin: Secondary | ICD-10-CM | POA: Diagnosis not present

## 2013-01-11 DIAGNOSIS — N186 End stage renal disease: Secondary | ICD-10-CM | POA: Diagnosis not present

## 2013-01-11 DIAGNOSIS — Z992 Dependence on renal dialysis: Secondary | ICD-10-CM | POA: Diagnosis not present

## 2013-01-11 DIAGNOSIS — R880 Cloudy (hemodialysis) (peritoneal) dialysis effluent: Secondary | ICD-10-CM | POA: Diagnosis not present

## 2013-01-12 DIAGNOSIS — N2581 Secondary hyperparathyroidism of renal origin: Secondary | ICD-10-CM | POA: Diagnosis not present

## 2013-01-12 DIAGNOSIS — Z992 Dependence on renal dialysis: Secondary | ICD-10-CM | POA: Diagnosis not present

## 2013-01-12 DIAGNOSIS — N186 End stage renal disease: Secondary | ICD-10-CM | POA: Diagnosis not present

## 2013-01-12 DIAGNOSIS — R880 Cloudy (hemodialysis) (peritoneal) dialysis effluent: Secondary | ICD-10-CM | POA: Diagnosis not present

## 2013-01-13 DIAGNOSIS — N186 End stage renal disease: Secondary | ICD-10-CM | POA: Diagnosis not present

## 2013-01-13 DIAGNOSIS — Z992 Dependence on renal dialysis: Secondary | ICD-10-CM | POA: Diagnosis not present

## 2013-01-13 DIAGNOSIS — R880 Cloudy (hemodialysis) (peritoneal) dialysis effluent: Secondary | ICD-10-CM | POA: Diagnosis not present

## 2013-01-13 DIAGNOSIS — N2581 Secondary hyperparathyroidism of renal origin: Secondary | ICD-10-CM | POA: Diagnosis not present

## 2013-01-14 DIAGNOSIS — Z992 Dependence on renal dialysis: Secondary | ICD-10-CM | POA: Diagnosis not present

## 2013-01-14 DIAGNOSIS — R880 Cloudy (hemodialysis) (peritoneal) dialysis effluent: Secondary | ICD-10-CM | POA: Diagnosis not present

## 2013-01-14 DIAGNOSIS — N2581 Secondary hyperparathyroidism of renal origin: Secondary | ICD-10-CM | POA: Diagnosis not present

## 2013-01-14 DIAGNOSIS — N186 End stage renal disease: Secondary | ICD-10-CM | POA: Diagnosis not present

## 2013-01-15 DIAGNOSIS — R880 Cloudy (hemodialysis) (peritoneal) dialysis effluent: Secondary | ICD-10-CM | POA: Diagnosis not present

## 2013-01-15 DIAGNOSIS — N2581 Secondary hyperparathyroidism of renal origin: Secondary | ICD-10-CM | POA: Diagnosis not present

## 2013-01-15 DIAGNOSIS — N186 End stage renal disease: Secondary | ICD-10-CM | POA: Diagnosis not present

## 2013-01-15 DIAGNOSIS — Z992 Dependence on renal dialysis: Secondary | ICD-10-CM | POA: Diagnosis not present

## 2013-01-16 DIAGNOSIS — N2581 Secondary hyperparathyroidism of renal origin: Secondary | ICD-10-CM | POA: Diagnosis not present

## 2013-01-16 DIAGNOSIS — N186 End stage renal disease: Secondary | ICD-10-CM | POA: Diagnosis not present

## 2013-01-16 DIAGNOSIS — Z992 Dependence on renal dialysis: Secondary | ICD-10-CM | POA: Diagnosis not present

## 2013-01-16 DIAGNOSIS — R880 Cloudy (hemodialysis) (peritoneal) dialysis effluent: Secondary | ICD-10-CM | POA: Diagnosis not present

## 2013-01-17 DIAGNOSIS — Z992 Dependence on renal dialysis: Secondary | ICD-10-CM | POA: Diagnosis not present

## 2013-01-17 DIAGNOSIS — N2581 Secondary hyperparathyroidism of renal origin: Secondary | ICD-10-CM | POA: Diagnosis not present

## 2013-01-17 DIAGNOSIS — N186 End stage renal disease: Secondary | ICD-10-CM | POA: Diagnosis not present

## 2013-01-17 DIAGNOSIS — R880 Cloudy (hemodialysis) (peritoneal) dialysis effluent: Secondary | ICD-10-CM | POA: Diagnosis not present

## 2013-01-18 DIAGNOSIS — R880 Cloudy (hemodialysis) (peritoneal) dialysis effluent: Secondary | ICD-10-CM | POA: Diagnosis not present

## 2013-01-18 DIAGNOSIS — N2581 Secondary hyperparathyroidism of renal origin: Secondary | ICD-10-CM | POA: Diagnosis not present

## 2013-01-18 DIAGNOSIS — Z992 Dependence on renal dialysis: Secondary | ICD-10-CM | POA: Diagnosis not present

## 2013-01-18 DIAGNOSIS — N186 End stage renal disease: Secondary | ICD-10-CM | POA: Diagnosis not present

## 2013-01-19 DIAGNOSIS — R880 Cloudy (hemodialysis) (peritoneal) dialysis effluent: Secondary | ICD-10-CM | POA: Diagnosis not present

## 2013-01-19 DIAGNOSIS — Z992 Dependence on renal dialysis: Secondary | ICD-10-CM | POA: Diagnosis not present

## 2013-01-19 DIAGNOSIS — N186 End stage renal disease: Secondary | ICD-10-CM | POA: Diagnosis not present

## 2013-01-19 DIAGNOSIS — N2581 Secondary hyperparathyroidism of renal origin: Secondary | ICD-10-CM | POA: Diagnosis not present

## 2013-01-20 DIAGNOSIS — R880 Cloudy (hemodialysis) (peritoneal) dialysis effluent: Secondary | ICD-10-CM | POA: Diagnosis not present

## 2013-01-20 DIAGNOSIS — N186 End stage renal disease: Secondary | ICD-10-CM | POA: Diagnosis not present

## 2013-01-20 DIAGNOSIS — N2581 Secondary hyperparathyroidism of renal origin: Secondary | ICD-10-CM | POA: Diagnosis not present

## 2013-01-20 DIAGNOSIS — Z992 Dependence on renal dialysis: Secondary | ICD-10-CM | POA: Diagnosis not present

## 2013-01-21 DIAGNOSIS — N2581 Secondary hyperparathyroidism of renal origin: Secondary | ICD-10-CM | POA: Diagnosis not present

## 2013-01-21 DIAGNOSIS — R880 Cloudy (hemodialysis) (peritoneal) dialysis effluent: Secondary | ICD-10-CM | POA: Diagnosis not present

## 2013-01-21 DIAGNOSIS — N186 End stage renal disease: Secondary | ICD-10-CM | POA: Diagnosis not present

## 2013-01-21 DIAGNOSIS — Z992 Dependence on renal dialysis: Secondary | ICD-10-CM | POA: Diagnosis not present

## 2013-01-22 DIAGNOSIS — R880 Cloudy (hemodialysis) (peritoneal) dialysis effluent: Secondary | ICD-10-CM | POA: Diagnosis not present

## 2013-01-22 DIAGNOSIS — N186 End stage renal disease: Secondary | ICD-10-CM | POA: Diagnosis not present

## 2013-01-22 DIAGNOSIS — N2581 Secondary hyperparathyroidism of renal origin: Secondary | ICD-10-CM | POA: Diagnosis not present

## 2013-01-22 DIAGNOSIS — Z992 Dependence on renal dialysis: Secondary | ICD-10-CM | POA: Diagnosis not present

## 2013-01-23 DIAGNOSIS — N2581 Secondary hyperparathyroidism of renal origin: Secondary | ICD-10-CM | POA: Diagnosis not present

## 2013-01-23 DIAGNOSIS — R880 Cloudy (hemodialysis) (peritoneal) dialysis effluent: Secondary | ICD-10-CM | POA: Diagnosis not present

## 2013-01-23 DIAGNOSIS — N186 End stage renal disease: Secondary | ICD-10-CM | POA: Diagnosis not present

## 2013-01-23 DIAGNOSIS — Z992 Dependence on renal dialysis: Secondary | ICD-10-CM | POA: Diagnosis not present

## 2013-01-24 DIAGNOSIS — N186 End stage renal disease: Secondary | ICD-10-CM | POA: Diagnosis not present

## 2013-01-24 DIAGNOSIS — R880 Cloudy (hemodialysis) (peritoneal) dialysis effluent: Secondary | ICD-10-CM | POA: Diagnosis not present

## 2013-01-24 DIAGNOSIS — N2581 Secondary hyperparathyroidism of renal origin: Secondary | ICD-10-CM | POA: Diagnosis not present

## 2013-01-24 DIAGNOSIS — Z992 Dependence on renal dialysis: Secondary | ICD-10-CM | POA: Diagnosis not present

## 2013-01-25 DIAGNOSIS — R880 Cloudy (hemodialysis) (peritoneal) dialysis effluent: Secondary | ICD-10-CM | POA: Diagnosis not present

## 2013-01-25 DIAGNOSIS — Z992 Dependence on renal dialysis: Secondary | ICD-10-CM | POA: Diagnosis not present

## 2013-01-25 DIAGNOSIS — N2581 Secondary hyperparathyroidism of renal origin: Secondary | ICD-10-CM | POA: Diagnosis not present

## 2013-01-25 DIAGNOSIS — N186 End stage renal disease: Secondary | ICD-10-CM | POA: Diagnosis not present

## 2013-01-26 DIAGNOSIS — Z992 Dependence on renal dialysis: Secondary | ICD-10-CM | POA: Diagnosis not present

## 2013-01-26 DIAGNOSIS — N2581 Secondary hyperparathyroidism of renal origin: Secondary | ICD-10-CM | POA: Diagnosis not present

## 2013-01-26 DIAGNOSIS — R880 Cloudy (hemodialysis) (peritoneal) dialysis effluent: Secondary | ICD-10-CM | POA: Diagnosis not present

## 2013-01-26 DIAGNOSIS — N186 End stage renal disease: Secondary | ICD-10-CM | POA: Diagnosis not present

## 2013-01-27 DIAGNOSIS — R880 Cloudy (hemodialysis) (peritoneal) dialysis effluent: Secondary | ICD-10-CM | POA: Diagnosis not present

## 2013-01-27 DIAGNOSIS — Z992 Dependence on renal dialysis: Secondary | ICD-10-CM | POA: Diagnosis not present

## 2013-01-27 DIAGNOSIS — N2581 Secondary hyperparathyroidism of renal origin: Secondary | ICD-10-CM | POA: Diagnosis not present

## 2013-01-27 DIAGNOSIS — N186 End stage renal disease: Secondary | ICD-10-CM | POA: Diagnosis not present

## 2013-01-28 DIAGNOSIS — Z992 Dependence on renal dialysis: Secondary | ICD-10-CM | POA: Diagnosis not present

## 2013-01-28 DIAGNOSIS — R880 Cloudy (hemodialysis) (peritoneal) dialysis effluent: Secondary | ICD-10-CM | POA: Diagnosis not present

## 2013-01-28 DIAGNOSIS — N186 End stage renal disease: Secondary | ICD-10-CM | POA: Diagnosis not present

## 2013-01-28 DIAGNOSIS — N2581 Secondary hyperparathyroidism of renal origin: Secondary | ICD-10-CM | POA: Diagnosis not present

## 2013-01-29 DIAGNOSIS — N2581 Secondary hyperparathyroidism of renal origin: Secondary | ICD-10-CM | POA: Diagnosis not present

## 2013-01-29 DIAGNOSIS — R880 Cloudy (hemodialysis) (peritoneal) dialysis effluent: Secondary | ICD-10-CM | POA: Diagnosis not present

## 2013-01-29 DIAGNOSIS — N186 End stage renal disease: Secondary | ICD-10-CM | POA: Diagnosis not present

## 2013-01-29 DIAGNOSIS — Z992 Dependence on renal dialysis: Secondary | ICD-10-CM | POA: Diagnosis not present

## 2013-01-30 DIAGNOSIS — R880 Cloudy (hemodialysis) (peritoneal) dialysis effluent: Secondary | ICD-10-CM | POA: Diagnosis not present

## 2013-01-30 DIAGNOSIS — N2581 Secondary hyperparathyroidism of renal origin: Secondary | ICD-10-CM | POA: Diagnosis not present

## 2013-01-30 DIAGNOSIS — Z992 Dependence on renal dialysis: Secondary | ICD-10-CM | POA: Diagnosis not present

## 2013-01-30 DIAGNOSIS — N186 End stage renal disease: Secondary | ICD-10-CM | POA: Diagnosis not present

## 2013-01-31 DIAGNOSIS — N2581 Secondary hyperparathyroidism of renal origin: Secondary | ICD-10-CM | POA: Diagnosis not present

## 2013-01-31 DIAGNOSIS — Z992 Dependence on renal dialysis: Secondary | ICD-10-CM | POA: Diagnosis not present

## 2013-01-31 DIAGNOSIS — R880 Cloudy (hemodialysis) (peritoneal) dialysis effluent: Secondary | ICD-10-CM | POA: Diagnosis not present

## 2013-01-31 DIAGNOSIS — N186 End stage renal disease: Secondary | ICD-10-CM | POA: Diagnosis not present

## 2013-02-01 DIAGNOSIS — N2581 Secondary hyperparathyroidism of renal origin: Secondary | ICD-10-CM | POA: Diagnosis not present

## 2013-02-01 DIAGNOSIS — N186 End stage renal disease: Secondary | ICD-10-CM | POA: Diagnosis not present

## 2013-02-01 DIAGNOSIS — R880 Cloudy (hemodialysis) (peritoneal) dialysis effluent: Secondary | ICD-10-CM | POA: Diagnosis not present

## 2013-02-01 DIAGNOSIS — Z992 Dependence on renal dialysis: Secondary | ICD-10-CM | POA: Diagnosis not present

## 2013-02-02 DIAGNOSIS — N186 End stage renal disease: Secondary | ICD-10-CM | POA: Diagnosis not present

## 2013-02-02 DIAGNOSIS — Z992 Dependence on renal dialysis: Secondary | ICD-10-CM | POA: Diagnosis not present

## 2013-02-02 DIAGNOSIS — N2581 Secondary hyperparathyroidism of renal origin: Secondary | ICD-10-CM | POA: Diagnosis not present

## 2013-02-02 DIAGNOSIS — R880 Cloudy (hemodialysis) (peritoneal) dialysis effluent: Secondary | ICD-10-CM | POA: Diagnosis not present

## 2013-02-03 DIAGNOSIS — N186 End stage renal disease: Secondary | ICD-10-CM | POA: Diagnosis not present

## 2013-02-03 DIAGNOSIS — R880 Cloudy (hemodialysis) (peritoneal) dialysis effluent: Secondary | ICD-10-CM | POA: Diagnosis not present

## 2013-02-03 DIAGNOSIS — N2581 Secondary hyperparathyroidism of renal origin: Secondary | ICD-10-CM | POA: Diagnosis not present

## 2013-02-03 DIAGNOSIS — Z992 Dependence on renal dialysis: Secondary | ICD-10-CM | POA: Diagnosis not present

## 2013-02-04 DIAGNOSIS — R880 Cloudy (hemodialysis) (peritoneal) dialysis effluent: Secondary | ICD-10-CM | POA: Diagnosis not present

## 2013-02-04 DIAGNOSIS — N186 End stage renal disease: Secondary | ICD-10-CM | POA: Diagnosis not present

## 2013-02-04 DIAGNOSIS — Z992 Dependence on renal dialysis: Secondary | ICD-10-CM | POA: Diagnosis not present

## 2013-02-04 DIAGNOSIS — N2581 Secondary hyperparathyroidism of renal origin: Secondary | ICD-10-CM | POA: Diagnosis not present

## 2013-02-05 DIAGNOSIS — Z992 Dependence on renal dialysis: Secondary | ICD-10-CM | POA: Diagnosis not present

## 2013-02-05 DIAGNOSIS — N186 End stage renal disease: Secondary | ICD-10-CM | POA: Diagnosis not present

## 2013-02-05 DIAGNOSIS — K650 Generalized (acute) peritonitis: Secondary | ICD-10-CM | POA: Diagnosis not present

## 2013-02-14 DIAGNOSIS — E1029 Type 1 diabetes mellitus with other diabetic kidney complication: Secondary | ICD-10-CM | POA: Diagnosis not present

## 2013-02-14 DIAGNOSIS — K650 Generalized (acute) peritonitis: Secondary | ICD-10-CM | POA: Diagnosis not present

## 2013-02-18 DIAGNOSIS — K650 Generalized (acute) peritonitis: Secondary | ICD-10-CM | POA: Diagnosis not present

## 2013-02-26 DIAGNOSIS — R880 Cloudy (hemodialysis) (peritoneal) dialysis effluent: Secondary | ICD-10-CM | POA: Diagnosis not present

## 2013-02-26 DIAGNOSIS — IMO0002 Reserved for concepts with insufficient information to code with codable children: Secondary | ICD-10-CM | POA: Diagnosis not present

## 2013-02-27 DIAGNOSIS — M545 Low back pain, unspecified: Secondary | ICD-10-CM | POA: Diagnosis not present

## 2013-03-05 DIAGNOSIS — K650 Generalized (acute) peritonitis: Secondary | ICD-10-CM | POA: Diagnosis not present

## 2013-03-07 ENCOUNTER — Other Ambulatory Visit: Payer: Self-pay | Admitting: Internal Medicine

## 2013-03-07 DIAGNOSIS — N186 End stage renal disease: Secondary | ICD-10-CM | POA: Diagnosis not present

## 2013-03-08 DIAGNOSIS — K650 Generalized (acute) peritonitis: Secondary | ICD-10-CM | POA: Diagnosis not present

## 2013-03-08 DIAGNOSIS — Z992 Dependence on renal dialysis: Secondary | ICD-10-CM | POA: Diagnosis not present

## 2013-03-08 DIAGNOSIS — N186 End stage renal disease: Secondary | ICD-10-CM | POA: Diagnosis not present

## 2013-03-08 DIAGNOSIS — R880 Cloudy (hemodialysis) (peritoneal) dialysis effluent: Secondary | ICD-10-CM | POA: Diagnosis not present

## 2013-03-08 DIAGNOSIS — N2581 Secondary hyperparathyroidism of renal origin: Secondary | ICD-10-CM | POA: Diagnosis not present

## 2013-03-25 DIAGNOSIS — R880 Cloudy (hemodialysis) (peritoneal) dialysis effluent: Secondary | ICD-10-CM | POA: Diagnosis not present

## 2013-03-29 DIAGNOSIS — K650 Generalized (acute) peritonitis: Secondary | ICD-10-CM | POA: Diagnosis not present

## 2013-03-31 IMAGING — XA IR REMOVAL TUNNELED CV CATH
1 series · 1 of 1 positions shown · non-contrast
Comparison: none

CLINICAL DATA: Catheter access no longer required.  The patient is
on peritoneal dialysis.

[Series 1: run · 1 of 1 slices shown]
[im 1/1]
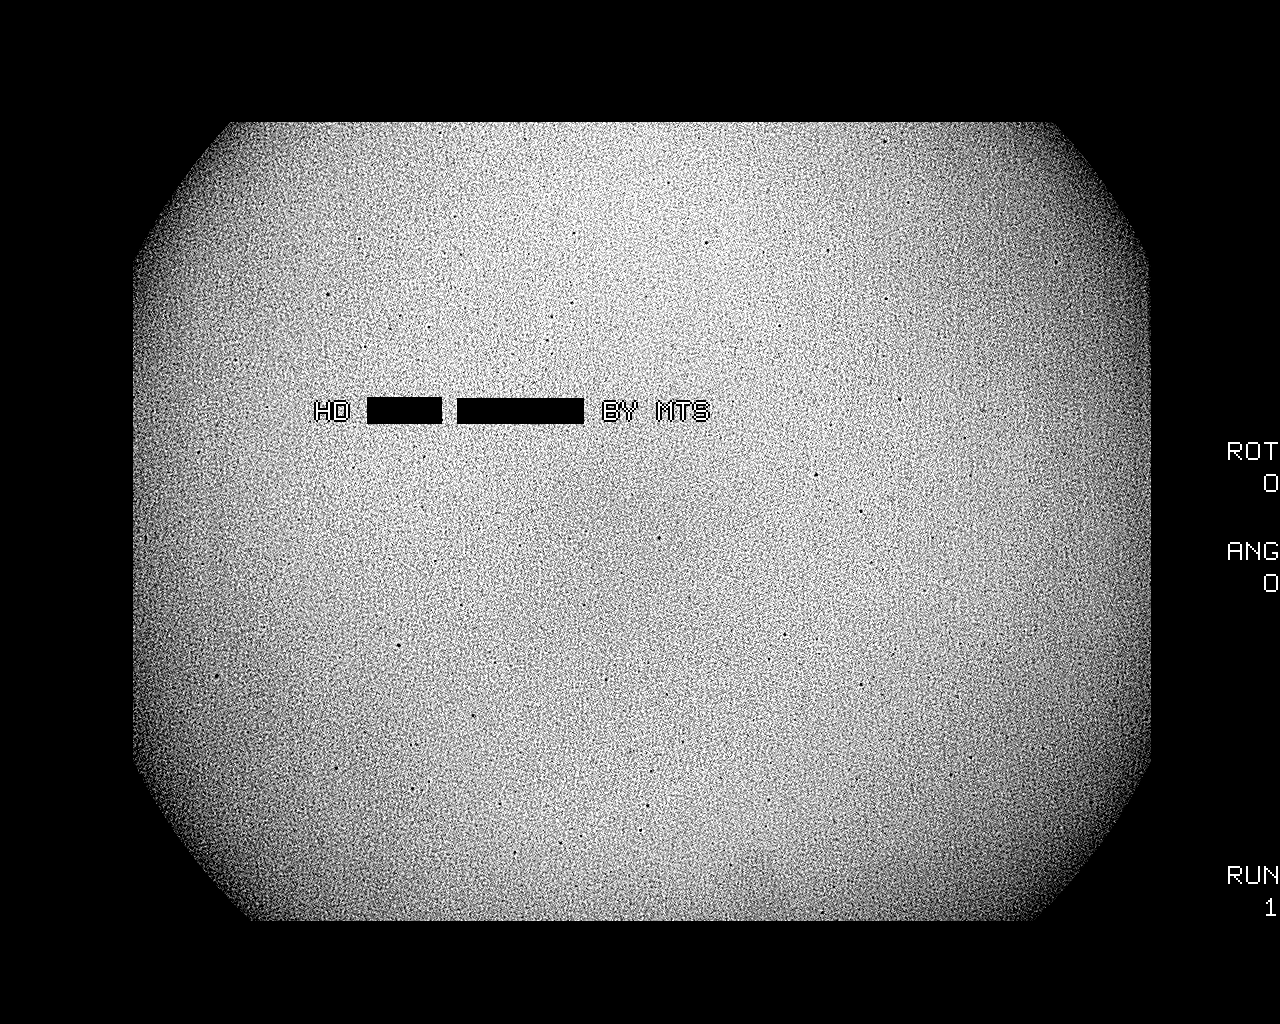

[1 of 1 positions shown; findings below may reference images not displayed]

RIGHT INTERNAL JUGULARTUNNELED HEMODIAYLISIS AUSTIN REMOVAL

Date:  05/23/2012 [DATE]

Radiologist:  Nazareth Jumper, M.D.

Medications:  1% lidocaine locally

Complications:  No immediate

PROCEDURE/FINDINGS:

Informed consent was obtained from the patient following
explanation of the procedure, risks, benefits and alternatives.
The patient understands, agrees and consents for the procedure.
All questions were addressed.  A time out was performed.

Maximal barrier sterile technique utilized including caps, mask,
sterile gowns, sterile gloves, large sterile drape, hand hygiene,
and Chloroprep.

1% lidocaine was injected under sterile conditions along the
subcutaneous tunnel.  The right internal jugulartunneled catheter
was removed utilizing blunt and sharp dissection to release the
retention cuff.  The catheter was removed entirely.  Hemostasis was
obtained with compression.  No immediate complication.  Sterile
dressing applied.  The patient tolerated the procedure well.
IMPRESSION: Successful right internal jugulartunnel dialysis catheter removal

## 2013-04-02 DIAGNOSIS — E785 Hyperlipidemia, unspecified: Secondary | ICD-10-CM | POA: Diagnosis present

## 2013-04-02 DIAGNOSIS — N186 End stage renal disease: Secondary | ICD-10-CM | POA: Diagnosis not present

## 2013-04-02 DIAGNOSIS — I10 Essential (primary) hypertension: Secondary | ICD-10-CM | POA: Diagnosis not present

## 2013-04-02 DIAGNOSIS — Z48298 Encounter for aftercare following other organ transplant: Secondary | ICD-10-CM | POA: Diagnosis not present

## 2013-04-02 DIAGNOSIS — K658 Other peritonitis: Secondary | ICD-10-CM | POA: Diagnosis not present

## 2013-04-02 DIAGNOSIS — Z794 Long term (current) use of insulin: Secondary | ICD-10-CM | POA: Diagnosis not present

## 2013-04-02 DIAGNOSIS — K869 Disease of pancreas, unspecified: Secondary | ICD-10-CM | POA: Diagnosis present

## 2013-04-02 DIAGNOSIS — K929 Disease of digestive system, unspecified: Secondary | ICD-10-CM | POA: Diagnosis not present

## 2013-04-02 DIAGNOSIS — S36503A Unspecified injury of sigmoid colon, initial encounter: Secondary | ICD-10-CM | POA: Diagnosis not present

## 2013-04-02 DIAGNOSIS — E1142 Type 2 diabetes mellitus with diabetic polyneuropathy: Secondary | ICD-10-CM | POA: Diagnosis present

## 2013-04-02 DIAGNOSIS — E1049 Type 1 diabetes mellitus with other diabetic neurological complication: Secondary | ICD-10-CM | POA: Diagnosis not present

## 2013-04-02 DIAGNOSIS — K560 Paralytic ileus: Secondary | ICD-10-CM | POA: Diagnosis not present

## 2013-04-02 DIAGNOSIS — K9089 Other intestinal malabsorption: Secondary | ICD-10-CM | POA: Diagnosis present

## 2013-04-02 DIAGNOSIS — K219 Gastro-esophageal reflux disease without esophagitis: Secondary | ICD-10-CM | POA: Diagnosis present

## 2013-04-02 DIAGNOSIS — R9431 Abnormal electrocardiogram [ECG] [EKG]: Secondary | ICD-10-CM | POA: Diagnosis not present

## 2013-04-02 DIAGNOSIS — K6389 Other specified diseases of intestine: Secondary | ICD-10-CM | POA: Diagnosis present

## 2013-04-02 DIAGNOSIS — Z940 Kidney transplant status: Secondary | ICD-10-CM | POA: Diagnosis not present

## 2013-04-02 DIAGNOSIS — Z4682 Encounter for fitting and adjustment of non-vascular catheter: Secondary | ICD-10-CM | POA: Diagnosis not present

## 2013-04-02 DIAGNOSIS — I120 Hypertensive chronic kidney disease with stage 5 chronic kidney disease or end stage renal disease: Secondary | ICD-10-CM | POA: Diagnosis not present

## 2013-04-02 DIAGNOSIS — F17200 Nicotine dependence, unspecified, uncomplicated: Secondary | ICD-10-CM | POA: Diagnosis present

## 2013-04-02 DIAGNOSIS — Z9483 Pancreas transplant status: Secondary | ICD-10-CM | POA: Diagnosis not present

## 2013-04-02 DIAGNOSIS — E878 Other disorders of electrolyte and fluid balance, not elsewhere classified: Secondary | ICD-10-CM | POA: Diagnosis not present

## 2013-04-02 DIAGNOSIS — K769 Liver disease, unspecified: Secondary | ICD-10-CM | POA: Diagnosis not present

## 2013-04-02 DIAGNOSIS — B952 Enterococcus as the cause of diseases classified elsewhere: Secondary | ICD-10-CM | POA: Diagnosis present

## 2013-04-02 DIAGNOSIS — Z79899 Other long term (current) drug therapy: Secondary | ICD-10-CM | POA: Diagnosis not present

## 2013-04-02 DIAGNOSIS — K7689 Other specified diseases of liver: Secondary | ICD-10-CM | POA: Diagnosis not present

## 2013-04-02 DIAGNOSIS — E8809 Other disorders of plasma-protein metabolism, not elsewhere classified: Secondary | ICD-10-CM | POA: Diagnosis present

## 2013-04-02 DIAGNOSIS — Z5181 Encounter for therapeutic drug level monitoring: Secondary | ICD-10-CM | POA: Diagnosis not present

## 2013-04-02 DIAGNOSIS — R918 Other nonspecific abnormal finding of lung field: Secondary | ICD-10-CM | POA: Diagnosis not present

## 2013-04-02 DIAGNOSIS — N058 Unspecified nephritic syndrome with other morphologic changes: Secondary | ICD-10-CM | POA: Diagnosis not present

## 2013-04-02 DIAGNOSIS — Z471 Aftercare following joint replacement surgery: Secondary | ICD-10-CM | POA: Diagnosis not present

## 2013-04-02 DIAGNOSIS — E1029 Type 1 diabetes mellitus with other diabetic kidney complication: Secondary | ICD-10-CM | POA: Diagnosis not present

## 2013-04-02 DIAGNOSIS — E860 Dehydration: Secondary | ICD-10-CM | POA: Diagnosis present

## 2013-04-02 DIAGNOSIS — D631 Anemia in chronic kidney disease: Secondary | ICD-10-CM | POA: Diagnosis present

## 2013-04-02 DIAGNOSIS — E876 Hypokalemia: Secondary | ICD-10-CM | POA: Diagnosis not present

## 2013-04-02 DIAGNOSIS — T85691A Other mechanical complication of intraperitoneal dialysis catheter, initial encounter: Secondary | ICD-10-CM | POA: Diagnosis not present

## 2013-04-02 HISTORY — DX: Kidney transplant status: Z94.0

## 2013-04-02 HISTORY — PX: SMALL INTESTINE SURGERY: SHX150

## 2013-04-02 HISTORY — PX: LIVER BIOPSY: SHX301

## 2013-04-02 HISTORY — PX: COMBINED KIDNEY-PANCREAS TRANSPLANT: SHX1382

## 2013-04-07 DIAGNOSIS — N186 End stage renal disease: Secondary | ICD-10-CM | POA: Diagnosis not present

## 2013-04-15 DIAGNOSIS — Z9483 Pancreas transplant status: Secondary | ICD-10-CM | POA: Diagnosis not present

## 2013-04-15 DIAGNOSIS — E869 Volume depletion, unspecified: Secondary | ICD-10-CM | POA: Diagnosis not present

## 2013-04-15 DIAGNOSIS — I10 Essential (primary) hypertension: Secondary | ICD-10-CM | POA: Diagnosis not present

## 2013-04-15 DIAGNOSIS — Z48298 Encounter for aftercare following other organ transplant: Secondary | ICD-10-CM | POA: Diagnosis not present

## 2013-04-15 DIAGNOSIS — Z940 Kidney transplant status: Secondary | ICD-10-CM | POA: Diagnosis not present

## 2013-04-18 DIAGNOSIS — Z9483 Pancreas transplant status: Secondary | ICD-10-CM | POA: Diagnosis not present

## 2013-04-18 DIAGNOSIS — I10 Essential (primary) hypertension: Secondary | ICD-10-CM | POA: Diagnosis not present

## 2013-04-18 DIAGNOSIS — Z48298 Encounter for aftercare following other organ transplant: Secondary | ICD-10-CM | POA: Diagnosis not present

## 2013-04-18 DIAGNOSIS — Z79899 Other long term (current) drug therapy: Secondary | ICD-10-CM | POA: Diagnosis not present

## 2013-04-18 DIAGNOSIS — E1029 Type 1 diabetes mellitus with other diabetic kidney complication: Secondary | ICD-10-CM | POA: Diagnosis not present

## 2013-04-18 DIAGNOSIS — Z940 Kidney transplant status: Secondary | ICD-10-CM | POA: Diagnosis not present

## 2013-04-18 DIAGNOSIS — N186 End stage renal disease: Secondary | ICD-10-CM | POA: Diagnosis not present

## 2013-04-18 DIAGNOSIS — N058 Unspecified nephritic syndrome with other morphologic changes: Secondary | ICD-10-CM | POA: Diagnosis not present

## 2013-04-18 DIAGNOSIS — N049 Nephrotic syndrome with unspecified morphologic changes: Secondary | ICD-10-CM | POA: Diagnosis not present

## 2013-04-22 DIAGNOSIS — Z940 Kidney transplant status: Secondary | ICD-10-CM | POA: Diagnosis not present

## 2013-04-22 DIAGNOSIS — T8610 Unspecified complication of kidney transplant: Secondary | ICD-10-CM | POA: Diagnosis not present

## 2013-04-22 DIAGNOSIS — Z9483 Pancreas transplant status: Secondary | ICD-10-CM | POA: Diagnosis not present

## 2013-04-22 DIAGNOSIS — Z79899 Other long term (current) drug therapy: Secondary | ICD-10-CM | POA: Diagnosis not present

## 2013-04-23 DIAGNOSIS — Z9889 Other specified postprocedural states: Secondary | ICD-10-CM | POA: Diagnosis not present

## 2013-04-23 DIAGNOSIS — D72829 Elevated white blood cell count, unspecified: Secondary | ICD-10-CM | POA: Diagnosis not present

## 2013-04-23 DIAGNOSIS — Z79899 Other long term (current) drug therapy: Secondary | ICD-10-CM | POA: Diagnosis not present

## 2013-04-23 DIAGNOSIS — Z940 Kidney transplant status: Secondary | ICD-10-CM | POA: Diagnosis not present

## 2013-04-23 DIAGNOSIS — R109 Unspecified abdominal pain: Secondary | ICD-10-CM | POA: Diagnosis not present

## 2013-04-23 DIAGNOSIS — T8610 Unspecified complication of kidney transplant: Secondary | ICD-10-CM | POA: Diagnosis not present

## 2013-04-23 DIAGNOSIS — R5081 Fever presenting with conditions classified elsewhere: Secondary | ICD-10-CM | POA: Diagnosis not present

## 2013-04-25 DIAGNOSIS — Z48298 Encounter for aftercare following other organ transplant: Secondary | ICD-10-CM | POA: Diagnosis not present

## 2013-04-25 DIAGNOSIS — Z940 Kidney transplant status: Secondary | ICD-10-CM | POA: Diagnosis not present

## 2013-04-25 DIAGNOSIS — Z79899 Other long term (current) drug therapy: Secondary | ICD-10-CM | POA: Diagnosis not present

## 2013-04-25 DIAGNOSIS — Z9483 Pancreas transplant status: Secondary | ICD-10-CM | POA: Diagnosis not present

## 2013-04-25 DIAGNOSIS — E872 Acidosis: Secondary | ICD-10-CM | POA: Diagnosis not present

## 2013-04-25 DIAGNOSIS — D72829 Elevated white blood cell count, unspecified: Secondary | ICD-10-CM | POA: Diagnosis not present

## 2013-04-29 DIAGNOSIS — Z9483 Pancreas transplant status: Secondary | ICD-10-CM | POA: Diagnosis not present

## 2013-04-29 DIAGNOSIS — Z940 Kidney transplant status: Secondary | ICD-10-CM | POA: Diagnosis not present

## 2013-04-29 DIAGNOSIS — G609 Hereditary and idiopathic neuropathy, unspecified: Secondary | ICD-10-CM | POA: Diagnosis not present

## 2013-04-29 DIAGNOSIS — E872 Acidosis: Secondary | ICD-10-CM | POA: Diagnosis not present

## 2013-04-29 DIAGNOSIS — Z79899 Other long term (current) drug therapy: Secondary | ICD-10-CM | POA: Diagnosis not present

## 2013-05-01 DIAGNOSIS — Z9483 Pancreas transplant status: Secondary | ICD-10-CM | POA: Diagnosis not present

## 2013-05-01 DIAGNOSIS — E872 Acidosis, unspecified: Secondary | ICD-10-CM | POA: Diagnosis not present

## 2013-05-01 DIAGNOSIS — Z79899 Other long term (current) drug therapy: Secondary | ICD-10-CM | POA: Diagnosis not present

## 2013-05-01 DIAGNOSIS — Z940 Kidney transplant status: Secondary | ICD-10-CM | POA: Diagnosis not present

## 2013-05-01 DIAGNOSIS — N186 End stage renal disease: Secondary | ICD-10-CM | POA: Diagnosis not present

## 2013-05-01 DIAGNOSIS — Z09 Encounter for follow-up examination after completed treatment for conditions other than malignant neoplasm: Secondary | ICD-10-CM | POA: Diagnosis not present

## 2013-05-01 DIAGNOSIS — N058 Unspecified nephritic syndrome with other morphologic changes: Secondary | ICD-10-CM | POA: Diagnosis not present

## 2013-05-01 DIAGNOSIS — Z5181 Encounter for therapeutic drug level monitoring: Secondary | ICD-10-CM | POA: Diagnosis not present

## 2013-05-01 DIAGNOSIS — I10 Essential (primary) hypertension: Secondary | ICD-10-CM | POA: Diagnosis not present

## 2013-05-01 DIAGNOSIS — E1029 Type 1 diabetes mellitus with other diabetic kidney complication: Secondary | ICD-10-CM | POA: Diagnosis not present

## 2013-05-01 DIAGNOSIS — Z48298 Encounter for aftercare following other organ transplant: Secondary | ICD-10-CM | POA: Diagnosis not present

## 2013-05-08 DIAGNOSIS — Z940 Kidney transplant status: Secondary | ICD-10-CM | POA: Diagnosis not present

## 2013-05-08 DIAGNOSIS — Z79899 Other long term (current) drug therapy: Secondary | ICD-10-CM | POA: Diagnosis not present

## 2013-05-08 DIAGNOSIS — E872 Acidosis: Secondary | ICD-10-CM | POA: Diagnosis not present

## 2013-05-08 DIAGNOSIS — Z9483 Pancreas transplant status: Secondary | ICD-10-CM | POA: Diagnosis not present

## 2013-05-08 DIAGNOSIS — E109 Type 1 diabetes mellitus without complications: Secondary | ICD-10-CM | POA: Diagnosis not present

## 2013-05-10 DIAGNOSIS — M545 Low back pain, unspecified: Secondary | ICD-10-CM | POA: Diagnosis not present

## 2013-05-13 DIAGNOSIS — N186 End stage renal disease: Secondary | ICD-10-CM | POA: Diagnosis not present

## 2013-05-13 DIAGNOSIS — I10 Essential (primary) hypertension: Secondary | ICD-10-CM | POA: Diagnosis not present

## 2013-05-13 DIAGNOSIS — Z79899 Other long term (current) drug therapy: Secondary | ICD-10-CM | POA: Diagnosis not present

## 2013-05-13 DIAGNOSIS — Z940 Kidney transplant status: Secondary | ICD-10-CM | POA: Diagnosis not present

## 2013-05-13 DIAGNOSIS — Z9483 Pancreas transplant status: Secondary | ICD-10-CM | POA: Diagnosis not present

## 2013-05-14 DIAGNOSIS — R319 Hematuria, unspecified: Secondary | ICD-10-CM | POA: Diagnosis not present

## 2013-05-14 DIAGNOSIS — Z48298 Encounter for aftercare following other organ transplant: Secondary | ICD-10-CM | POA: Diagnosis not present

## 2013-05-14 DIAGNOSIS — Z940 Kidney transplant status: Secondary | ICD-10-CM | POA: Diagnosis not present

## 2013-05-20 ENCOUNTER — Ambulatory Visit (INDEPENDENT_AMBULATORY_CARE_PROVIDER_SITE_OTHER): Payer: Medicare Other | Admitting: Internal Medicine

## 2013-05-20 ENCOUNTER — Encounter: Payer: Self-pay | Admitting: Internal Medicine

## 2013-05-20 VITALS — BP 130/78 | HR 104 | Temp 98.9°F | Wt 150.5 lb

## 2013-05-20 DIAGNOSIS — Z716 Tobacco abuse counseling: Secondary | ICD-10-CM

## 2013-05-20 DIAGNOSIS — Z794 Long term (current) use of insulin: Secondary | ICD-10-CM | POA: Diagnosis not present

## 2013-05-20 DIAGNOSIS — R000 Tachycardia, unspecified: Secondary | ICD-10-CM | POA: Diagnosis not present

## 2013-05-20 DIAGNOSIS — T148XXA Other injury of unspecified body region, initial encounter: Secondary | ICD-10-CM

## 2013-05-20 DIAGNOSIS — Z9483 Pancreas transplant status: Secondary | ICD-10-CM | POA: Diagnosis not present

## 2013-05-20 DIAGNOSIS — K3184 Gastroparesis: Secondary | ICD-10-CM | POA: Diagnosis not present

## 2013-05-20 DIAGNOSIS — F17200 Nicotine dependence, unspecified, uncomplicated: Secondary | ICD-10-CM | POA: Diagnosis not present

## 2013-05-20 DIAGNOSIS — Z940 Kidney transplant status: Secondary | ICD-10-CM | POA: Diagnosis not present

## 2013-05-20 DIAGNOSIS — Z79899 Other long term (current) drug therapy: Secondary | ICD-10-CM | POA: Diagnosis not present

## 2013-05-20 DIAGNOSIS — Z7982 Long term (current) use of aspirin: Secondary | ICD-10-CM | POA: Diagnosis not present

## 2013-05-20 DIAGNOSIS — E1142 Type 2 diabetes mellitus with diabetic polyneuropathy: Secondary | ICD-10-CM | POA: Diagnosis not present

## 2013-05-20 DIAGNOSIS — N529 Male erectile dysfunction, unspecified: Secondary | ICD-10-CM | POA: Diagnosis not present

## 2013-05-20 DIAGNOSIS — E872 Acidosis: Secondary | ICD-10-CM | POA: Diagnosis not present

## 2013-05-20 DIAGNOSIS — Z7189 Other specified counseling: Secondary | ICD-10-CM

## 2013-05-20 DIAGNOSIS — E1049 Type 1 diabetes mellitus with other diabetic neurological complication: Secondary | ICD-10-CM | POA: Diagnosis not present

## 2013-05-20 DIAGNOSIS — M549 Dorsalgia, unspecified: Secondary | ICD-10-CM | POA: Diagnosis not present

## 2013-05-20 DIAGNOSIS — Z48298 Encounter for aftercare following other organ transplant: Secondary | ICD-10-CM | POA: Diagnosis not present

## 2013-05-20 LAB — BASIC METABOLIC PANEL
BUN: 16 mg/dL (ref 4–21)
Creatinine: 1 mg/dL (ref <=1.3)
Potassium: 4.5 mmol/L (ref 3.4–5.3)

## 2013-05-20 LAB — HEPATIC FUNCTION PANEL
ALT: 19 U/L (ref 10–40)
Alkaline Phosphatase: 121 U/L (ref 25–125)
Bilirubin, Total: 0.4 mg/dL

## 2013-05-20 LAB — CBC AND DIFFERENTIAL
Hemoglobin: 14.2 g/dL (ref 13.5–17.5)
WBC: 12.8 10^3/mL

## 2013-05-20 MED ORDER — BUPROPION HCL ER (XL) 150 MG PO TB24: 150.0000 mg | ORAL_TABLET | Freq: Every day | ORAL | Status: DC

## 2013-05-20 MED ORDER — CYCLOBENZAPRINE HCL 10 MG PO TABS: 10.0000 mg | ORAL_TABLET | Freq: Three times a day (TID) | ORAL | Status: DC | PRN

## 2013-05-20 NOTE — Progress Notes (Signed)
Subjective:    Patient ID: Mark Miranda, male    DOB: Sep 10, 1970, 42 y.o.   MRN: [id number]  HPI  Pt presents to the clinic today with c/o low back pain. This has been a chronic issue for the past couple of years. It seems to be worse with activity and movement. He denies numbness and tingling in his legs. He has taken tylenol OTC without much relief. He denies loss of bowel or bladder.  Additionally, he would like to quit smoking. He is now smoking 2 packs per day. He has considered his options- patches, gum, chantix, wellbutrin. He decided he would like to try wellbutrin.  Review of Systems  Past Medical History  Diagnosis Date  . Diabetes mellitus   . Renal disorder   . Hypertension   . Hypercholesterolemia   . Depression   . Migraines     Current Outpatient Prescriptions  Medication Sig Dispense Refill  . gabapentin (NEURONTIN) 300 MG capsule Take 300 mg by mouth at bedtime.      . [DISCONTINUED] diltiazem (CARDIZEM) 30 MG tablet Take 30 mg by mouth 4 (four) times daily. Ask patient to verify dosage and frequency. Not indicated on medical history form dated 10/23/10.       . [DISCONTINUED] insulin lispro (HUMALOG) 100 UNIT/ML injection Inject into the skin 3 (three) times daily before meals. Ask patient to verify name/dosage/. History form noted dosage of 34 units during day and 22 units at night.        No current facility-administered medications for this visit.    No Known Allergies  Family History  Problem Relation Age of Onset  . Diabetes Mother   . Hypertension Mother   . Hyperlipidemia Mother     History   Social History  . Marital Status: Married    Spouse Name: N/A    Number of Children: N/A  . Years of Education: 10   Occupational History  . [employer]    Social History Main Topics  . Smoking status: Current Every Day Smoker -- 1.00 packs/day    Types: Cigarettes  . Smokeless tobacco: Never Used  . Alcohol Use: No  . Drug Use: No  . Sexual  Activity: Not on file   Other Topics Concern  . Not on file   Social History Narrative   Regular exercise-yes   Caffeine Use-no     Constitutional: Denies fever, malaise, fatigue, headache or abrupt weight changes.  Musculoskeletal: Pt reports low back pain. Denies decrease in range of motion, difficulty with gait, or joint pain and swelling.  Neurological: Denies dizziness, difficulty with memory, difficulty with speech or problems with balance and coordination.   No other specific complaints in a complete review of systems (except as listed in HPI above).     Objective:   Physical Exam   BP 130/78  Pulse 104  Temp(Src) 98.9 F (37.2 C) (Oral)  Wt 150 lb 8 oz (68.266 kg)  BMI 22.89 kg/m2  SpO2 97% Wt Readings from Last 3 Encounters:  05/20/13 150 lb 8 oz (68.266 kg)  08/15/12 150 lb 9.6 oz (68.312 kg)  07/31/12 146 lb 8 oz (66.452 kg)    General: Appears his stated age, well developed, well nourished in NAD. Cardiovascular: Normal rate and rhythm. S1,S2 noted.  No murmur, rubs or gallops noted. No JVD or BLE edema. No carotid bruits noted. Pulmonary/Chest: Normal effort and positive vesicular breath sounds. No respiratory distress. No wheezes, rales or ronchi noted.  Musculoskeletal: Normal range  of motion. No signs of joint swelling. No difficulty with gait.  Neurological: Alert and oriented. Cranial nerves II-XII intact. Coordination normal. +DTRs bilaterally.  BMET    Component Value Date/Time   NA 137 07/17/2012 1520   K 4.4 07/17/2012 1520   CL 101 07/17/2012 1520   CO2 28 07/17/2012 1520   GLUCOSE 131* 07/17/2012 1520   BUN 58* 07/17/2012 1520   CREATININE 10.5* 07/17/2012 1520   CALCIUM 8.3* 07/17/2012 1520   CALCIUM 8.2* 09/17/2010 2201   GFRNONAA 9* 03/08/2012 1144   GFRAA 11* 03/08/2012 1144    Lipid Panel     Component Value Date/Time   CHOL 219* 07/17/2012 1520   TRIG 330.0* 07/17/2012 1520   HDL 29.50* 07/17/2012 1520   CHOLHDL 7 07/17/2012 1520    VLDL 66.0* 07/17/2012 1520   LDLCALC 107* 09/17/2010 2201    CBC    Component Value Date/Time   WBC 13.9* 07/17/2012 1520   RBC 4.69 07/17/2012 1520   HGB 14.7 07/17/2012 1520   HCT 45.6 07/17/2012 1520   PLT 306.0 07/17/2012 1520   MCV 97.3 07/17/2012 1520   MCH 29.7 03/08/2012 1144   MCHC 32.2 07/17/2012 1520   RDW 15.0* 07/17/2012 1520   LYMPHSABS 1.2 01/06/2012 1433   MONOABS 0.8 01/06/2012 1433   EOSABS 0.2 01/06/2012 1433   BASOSABS 0.0 01/06/2012 1433    Hgb A1C Lab Results  Component Value Date   HGBA1C 8.0* 07/17/2012        Assessment & Plan:   Muscle strain of lower back:  Continue tylenol as needed for pain eRx for flexeril Stretching exercises given   Encounter for smoking cessation counseling options:  Reviewed different options Discussed how wellbutrin works in the quitting process Information given on successful tips and tricks for quitting  RTC As needed

## 2013-05-20 NOTE — Patient Instructions (Addendum)
Back Exercises These exercises may help you when beginning to rehabilitate your injury. Your symptoms may resolve with or without further involvement from your physician, physical therapist or athletic trainer. While completing these exercises, remember:   Restoring tissue flexibility helps normal motion to return to the joints. This allows healthier, less painful movement and activity.  An effective stretch should be held for at least 30 seconds.  A stretch should never be painful. You should only feel a gentle lengthening or release in the stretched tissue. STRETCH  Extension, Prone on Elbows   Lie on your stomach on the floor, a bed will be too soft. Place your palms about shoulder width apart and at the height of your head.  Place your elbows under your shoulders. If this is too painful, stack pillows under your chest.  Allow your body to relax so that your hips drop lower and make contact more completely with the floor.  Hold this position for __________ seconds.  Slowly return to lying flat on the floor. Repeat __________ times. Complete this exercise __________ times per day.  RANGE OF MOTION  Extension, Prone Press Ups   Lie on your stomach on the floor, a bed will be too soft. Place your palms about shoulder width apart and at the height of your head.  Keeping your back as relaxed as possible, slowly straighten your elbows while keeping your hips on the floor. You may adjust the placement of your hands to maximize your comfort. As you gain motion, your hands will come more underneath your shoulders.  Hold this position __________ seconds.  Slowly return to lying flat on the floor. Repeat __________ times. Complete this exercise __________ times per day.  RANGE OF MOTION- Quadruped, Neutral Spine   Assume a hands and knees position on a firm surface. Keep your hands under your shoulders and your knees under your hips. You may place padding under your knees for comfort.  Drop  your head and point your tail bone toward the ground below you. This will round out your low back like an angry cat. Hold this position for __________ seconds.  Slowly lift your head and release your tail bone so that your back sags into a large arch, like an old horse.  Hold this position for __________ seconds.  Repeat this until you feel limber in your low back.  Now, find your "sweet spot." This will be the most comfortable position somewhere between the two previous positions. This is your neutral spine. Once you have found this position, tense your stomach muscles to support your low back.  Hold this position for __________ seconds. Repeat __________ times. Complete this exercise __________ times per day.  STRETCH  Flexion, Single Knee to Chest   Lie on a firm bed or floor with both legs extended in front of you.  Keeping one leg in contact with the floor, bring your opposite knee to your chest. Hold your leg in place by either grabbing behind your thigh or at your knee.  Pull until you feel a gentle stretch in your low back. Hold __________ seconds.  Slowly release your grasp and repeat the exercise with the opposite side. Repeat __________ times. Complete this exercise __________ times per day.  STRETCH - Hamstrings, Standing  Stand or sit and extend your right / left leg, placing your foot on a chair or foot stool  Keeping a slight arch in your low back and your hips straight forward.  Lead with your chest and   lean forward at the waist until you feel a gentle stretch in the back of your right / left knee or thigh. (When done correctly, this exercise requires leaning only a small distance.)  Hold this position for __________ seconds. Repeat __________ times. Complete this stretch __________ times per day. STRENGTHENING  Deep Abdominals, Pelvic Tilt   Lie on a firm bed or floor. Keeping your legs in front of you, bend your knees so they are both pointed toward the ceiling and  your feet are flat on the floor.  Tense your lower abdominal muscles to press your low back into the floor. This motion will rotate your pelvis so that your tail bone is scooping upwards rather than pointing at your feet or into the floor.  With a gentle tension and even breathing, hold this position for __________ seconds. Repeat __________ times. Complete this exercise __________ times per day.  STRENGTHENING  Abdominals, Crunches   Lie on a firm bed or floor. Keeping your legs in front of you, bend your knees so they are both pointed toward the ceiling and your feet are flat on the floor. Cross your arms over your chest.  Slightly tip your chin down without bending your neck.  Tense your abdominals and slowly lift your trunk high enough to just clear your shoulder blades. Lifting higher can put excessive stress on the low back and does not further strengthen your abdominal muscles.  Control your return to the starting position. Repeat __________ times. Complete this exercise __________ times per day.  STRENGTHENING  Quadruped, Opposite UE/LE Lift   Assume a hands and knees position on a firm surface. Keep your hands under your shoulders and your knees under your hips. You may place padding under your knees for comfort.  Find your neutral spine and gently tense your abdominal muscles so that you can maintain this position. Your shoulders and hips should form a rectangle that is parallel with the floor and is not twisted.  Keeping your trunk steady, lift your right hand no higher than your shoulder and then your left leg no higher than your hip. Make sure you are not holding your breath. Hold this position __________ seconds.  Continuing to keep your abdominal muscles tense and your back steady, slowly return to your starting position. Repeat with the opposite arm and leg. Repeat __________ times. Complete this exercise __________ times per day. Document Released: 08/12/2005 Document  Revised: 10/17/2011 Document Reviewed: 11/06/2008 [facility] Patient Information 2014 [place], [place]. Smoking Cessation, Tips for Success YOU CAN QUIT SMOKING If you are ready to quit smoking, congratulations! You have chosen to help yourself be healthier. Cigarettes bring nicotine, tar, carbon monoxide, and other irritants into your body. Your lungs, heart, and blood vessels will be able to work better without these poisons. There are many different ways to quit smoking. Nicotine gum, nicotine patches, a nicotine inhaler, or nicotine nasal spray can help with physical craving. Hypnosis, support groups, and medicines help break the habit of smoking. Here are some tips to help you quit for good.  Throw away all cigarettes.  Clean and remove all ashtrays from your home, work, and car.  On a card, write down your reasons for quitting. Carry the card with you and read it when you get the urge to smoke.  Cleanse your body of nicotine. Drink enough water and fluids to keep your urine clear or pale yellow. Do this after quitting to flush the nicotine from your body.  Learn to predict  your moods. Do not let a bad situation be your excuse to have a cigarette. Some situations in your life might tempt you into wanting a cigarette.  Never have "just one" cigarette. It leads to wanting another and another. Remind yourself of your decision to quit.  Change habits associated with smoking. If you smoked while driving or when feeling stressed, try other activities to replace smoking. Stand up when drinking your coffee. Brush your teeth after eating. Sit in a different chair when you read the paper. Avoid alcohol while trying to quit, and try to drink fewer caffeinated beverages. Alcohol and caffeine may urge you to smoke.  Avoid foods and drinks that can trigger a desire to smoke, such as sugary or spicy foods and alcohol.  Ask people who smoke not to smoke around you.  Have something planned to do right after  eating or having a cup of coffee. Take a walk or exercise to perk you up. This will help to keep you from overeating.  Try a relaxation exercise to calm you down and decrease your stress. Remember, you may be tense and nervous for the first 2 weeks after you quit, but this will pass.  Find new activities to keep your hands busy. Play with a pen, coin, or rubber band. Doodle or draw things on paper.  Brush your teeth right after eating. This will help cut down on the craving for the taste of tobacco after meals. You can try mouthwash, too.  Use oral substitutes, such as lemon drops, carrots, a cinnamon stick, or chewing gum, in place of cigarettes. Keep them handy so they are available when you have the urge to smoke.  When you have the urge to smoke, try deep breathing.  Designate your home as a nonsmoking area.  If you are a heavy smoker, ask your caregiver about a prescription for nicotine chewing gum. It can ease your withdrawal from nicotine.  Reward yourself. Set aside the cigarette money you save and buy yourself something nice.  Look for support from others. Join a support group or smoking cessation program. Ask someone at home or at work to help you with your plan to quit smoking.  Always ask yourself, "Do I need this cigarette or is this just a reflex?" Tell yourself, "Today, I choose not to smoke," or "I do not want to smoke." You are reminding yourself of your decision to quit, even if you do smoke a cigarette. HOW WILL I FEEL WHEN I QUIT SMOKING?  The benefits of not smoking start within days of quitting.  You may have symptoms of withdrawal because your body is used to nicotine (the addictive substance in cigarettes). You may crave cigarettes, be irritable, feel very hungry, cough often, get headaches, or have difficulty concentrating.  The withdrawal symptoms are only temporary. They are strongest when you first quit but will go away within 10 to 14 days.  When withdrawal  symptoms occur, stay in control. Think about your reasons for quitting. Remind yourself that these are signs that your body is healing and getting used to being without cigarettes.  Remember that withdrawal symptoms are easier to treat than the major diseases that smoking can cause.  Even after the withdrawal is over, expect periodic urges to smoke. However, these cravings are generally short-lived and will go away whether you smoke or not. Do not smoke!  If you relapse and smoke again, do not lose hope. Most smokers quit 3 times before they are successful.  If you relapse, do not give up! Plan ahead and think about what you will do the next time you get the urge to smoke. LIFE AS A NONSMOKER: MAKE IT FOR A MONTH, MAKE IT FOR LIFE Day 1: Hang this page where you will see it every day. Day 2: Get rid of all ashtrays, matches, and lighters. Day 3: Drink water. Breathe deeply between sips. Day 4: Avoid places with smoke-filled air, such as bars, clubs, or the smoking section of restaurants. Day 5: Keep track of how much money you save by not smoking. Day 6: Avoid boredom. Keep a good book with you or go to the movies. Day 7: Reward yourself! One week without smoking! Day 8: Make a dental appointment to get your teeth cleaned. Day 9: Decide how you will turn down a cigarette before it is offered to you. Day 10: Review your reasons for quitting. Day 11: Distract yourself. Stay active to keep your mind off smoking and to relieve tension. Take a walk, exercise, read a book, do a crossword puzzle, or try a new hobby. Day 12: Exercise. Get off the bus before your stop or use stairs instead of escalators. Day 13: Call on friends for support and encouragement. Day 14: Reward yourself! Two weeks without smoking! Day 15: Practice deep breathing exercises. Day 16: Bet a friend that you can stay a nonsmoker. Day 17: Ask to sit in nonsmoking sections of restaurants. Day 18: Hang up "No Smoking"  signs. Day 19: Think of yourself as a nonsmoker. Day 20: Each morning, tell yourself you will not smoke. Day 21: Reward yourself! Three weeks without smoking! Day 22: Think of smoking in negative ways. Remember how it stains your teeth, gives you bad breath, and leaves you short of breath. Day 23: Eat a nutritious breakfast. Day 24:Do not relive your days as a smoker. Day 25: Hold a pencil in your hand when talking on the telephone. Day 26: Tell all your friends you do not smoke. Day 27: Think about how much better food tastes. Day 28: Remember, one cigarette is one too many. Day 29: Take up a hobby that will keep your hands busy. Day 30: Congratulations! One month without smoking! Give yourself a big reward. Your caregiver can direct you to community resources or hospitals for support, which may include:  Group support.  Education.  Hypnosis.  Subliminal therapy. Document Released: 04/22/2004 Document Revised: 10/17/2011 Document Reviewed: 05/11/2009 [facility] Patient Information 2014 [place], [place]. You Can Quit Smoking If you are ready to quit smoking or are thinking about it, congratulations! You have chosen to help yourself be healthier and live longer! There are lots of different ways to quit smoking. Nicotine gum, nicotine patches, a nicotine inhaler, or nicotine nasal spray can help with physical craving. Hypnosis, support groups, and medicines help break the habit of smoking. TIPS TO GET OFF AND STAY OFF CIGARETTES  Learn to predict your moods. Do not let a bad situation be your excuse to have a cigarette. Some situations in your life might tempt you to have a cigarette.  Ask friends and co-workers not to smoke around you.  Make your home smoke-free.  Never have "just one" cigarette. It leads to wanting another and another. Remind yourself of your decision to quit.  On a card, make a list of your reasons for not smoking. Read it at least the same number of times a day as  you have a cigarette. Tell yourself everyday, "I do not want  to smoke. I choose not to smoke."  Ask someone at home or work to help you with your plan to quit smoking.  Have something planned after you eat or have a cup of coffee. Take a walk or get other exercise to perk you up. This will help to keep you from overeating.  Try a relaxation exercise to calm you down and decrease your stress. Remember, you may be tense and nervous the first two weeks after you quit. This will pass.  Find new activities to keep your hands busy. Play with a pen, coin, or rubber band. Doodle or draw things on paper.  Brush your teeth right after eating. This will help cut down the craving for the taste of tobacco after meals. You can try mouthwash too.  Try gum, breath mints, or diet candy to keep something in your mouth. IF YOU SMOKE AND WANT TO QUIT:  Do not stock up on cigarettes. Never buy a carton. Wait until one pack is finished before you buy another.  Never carry cigarettes with you at work or at home.  Keep cigarettes as far away from you as possible. Leave them with someone else.  Never carry matches or a lighter with you.  Ask yourself, "Do I need this cigarette or is this just a reflex?"  Bet with someone that you can quit. Put cigarette money in a piggy bank every morning. If you smoke, you give up the money. If you do not smoke, by the end of the week, you keep the money.  Keep trying. It takes 21 days to change a habit!  Talk to your doctor about using medicines to help you quit. These include nicotine replacement gum, lozenges, or skin patches. Document Released: 05/21/2009 Document Revised: 10/17/2011 Document Reviewed: 05/21/2009 [facility] Patient Information 2014 [place], [place].

## 2013-05-23 ENCOUNTER — Encounter: Payer: Self-pay | Admitting: Internal Medicine

## 2013-05-27 DIAGNOSIS — Z48298 Encounter for aftercare following other organ transplant: Secondary | ICD-10-CM | POA: Diagnosis not present

## 2013-05-27 DIAGNOSIS — Z940 Kidney transplant status: Secondary | ICD-10-CM | POA: Diagnosis not present

## 2013-05-27 DIAGNOSIS — Z9483 Pancreas transplant status: Secondary | ICD-10-CM | POA: Diagnosis not present

## 2013-05-31 DIAGNOSIS — M545 Low back pain, unspecified: Secondary | ICD-10-CM | POA: Diagnosis not present

## 2013-06-03 DIAGNOSIS — Z940 Kidney transplant status: Secondary | ICD-10-CM | POA: Diagnosis not present

## 2013-06-03 DIAGNOSIS — Z9483 Pancreas transplant status: Secondary | ICD-10-CM | POA: Diagnosis not present

## 2013-06-03 DIAGNOSIS — Z48298 Encounter for aftercare following other organ transplant: Secondary | ICD-10-CM | POA: Diagnosis not present

## 2013-06-03 DIAGNOSIS — E872 Acidosis: Secondary | ICD-10-CM | POA: Diagnosis not present

## 2013-06-03 DIAGNOSIS — I959 Hypotension, unspecified: Secondary | ICD-10-CM | POA: Diagnosis not present

## 2013-06-03 DIAGNOSIS — F17200 Nicotine dependence, unspecified, uncomplicated: Secondary | ICD-10-CM | POA: Diagnosis not present

## 2013-06-06 DIAGNOSIS — M545 Low back pain, unspecified: Secondary | ICD-10-CM | POA: Diagnosis not present

## 2013-06-14 DIAGNOSIS — Z9483 Pancreas transplant status: Secondary | ICD-10-CM | POA: Diagnosis not present

## 2013-06-14 DIAGNOSIS — Z79899 Other long term (current) drug therapy: Secondary | ICD-10-CM | POA: Diagnosis not present

## 2013-06-14 DIAGNOSIS — F17200 Nicotine dependence, unspecified, uncomplicated: Secondary | ICD-10-CM | POA: Diagnosis not present

## 2013-06-14 DIAGNOSIS — N058 Unspecified nephritic syndrome with other morphologic changes: Secondary | ICD-10-CM | POA: Diagnosis not present

## 2013-06-14 DIAGNOSIS — E872 Acidosis: Secondary | ICD-10-CM | POA: Diagnosis not present

## 2013-06-14 DIAGNOSIS — D72829 Elevated white blood cell count, unspecified: Secondary | ICD-10-CM | POA: Diagnosis not present

## 2013-06-14 DIAGNOSIS — E1029 Type 1 diabetes mellitus with other diabetic kidney complication: Secondary | ICD-10-CM | POA: Diagnosis not present

## 2013-06-14 DIAGNOSIS — I10 Essential (primary) hypertension: Secondary | ICD-10-CM | POA: Diagnosis not present

## 2013-06-14 DIAGNOSIS — B349 Viral infection, unspecified: Secondary | ICD-10-CM | POA: Diagnosis not present

## 2013-06-14 DIAGNOSIS — Z940 Kidney transplant status: Secondary | ICD-10-CM | POA: Diagnosis not present

## 2013-06-14 DIAGNOSIS — I959 Hypotension, unspecified: Secondary | ICD-10-CM | POA: Diagnosis not present

## 2013-06-15 DIAGNOSIS — M47817 Spondylosis without myelopathy or radiculopathy, lumbosacral region: Secondary | ICD-10-CM | POA: Diagnosis not present

## 2013-06-21 DIAGNOSIS — Z79899 Other long term (current) drug therapy: Secondary | ICD-10-CM | POA: Diagnosis not present

## 2013-06-21 DIAGNOSIS — Z9483 Pancreas transplant status: Secondary | ICD-10-CM | POA: Diagnosis not present

## 2013-06-21 DIAGNOSIS — M47817 Spondylosis without myelopathy or radiculopathy, lumbosacral region: Secondary | ICD-10-CM | POA: Diagnosis not present

## 2013-07-05 DIAGNOSIS — B349 Viral infection, unspecified: Secondary | ICD-10-CM | POA: Diagnosis not present

## 2013-07-05 DIAGNOSIS — F17200 Nicotine dependence, unspecified, uncomplicated: Secondary | ICD-10-CM | POA: Diagnosis not present

## 2013-07-05 DIAGNOSIS — Z9483 Pancreas transplant status: Secondary | ICD-10-CM | POA: Diagnosis not present

## 2013-07-05 DIAGNOSIS — Z79899 Other long term (current) drug therapy: Secondary | ICD-10-CM | POA: Diagnosis not present

## 2013-07-05 DIAGNOSIS — Z940 Kidney transplant status: Secondary | ICD-10-CM | POA: Diagnosis not present

## 2013-07-08 DIAGNOSIS — M5137 Other intervertebral disc degeneration, lumbosacral region: Secondary | ICD-10-CM | POA: Diagnosis not present

## 2013-07-08 DIAGNOSIS — M545 Low back pain, unspecified: Secondary | ICD-10-CM | POA: Diagnosis not present

## 2013-07-19 DIAGNOSIS — Z940 Kidney transplant status: Secondary | ICD-10-CM | POA: Diagnosis not present

## 2013-07-19 DIAGNOSIS — Z48298 Encounter for aftercare following other organ transplant: Secondary | ICD-10-CM | POA: Diagnosis not present

## 2013-07-19 DIAGNOSIS — Z9483 Pancreas transplant status: Secondary | ICD-10-CM | POA: Diagnosis not present

## 2013-07-26 DIAGNOSIS — Z48298 Encounter for aftercare following other organ transplant: Secondary | ICD-10-CM | POA: Diagnosis not present

## 2013-07-26 DIAGNOSIS — Z9483 Pancreas transplant status: Secondary | ICD-10-CM | POA: Diagnosis not present

## 2013-07-26 DIAGNOSIS — Z940 Kidney transplant status: Secondary | ICD-10-CM | POA: Diagnosis not present

## 2013-07-30 DIAGNOSIS — K5900 Constipation, unspecified: Secondary | ICD-10-CM | POA: Diagnosis not present

## 2013-07-30 DIAGNOSIS — E109 Type 1 diabetes mellitus without complications: Secondary | ICD-10-CM | POA: Diagnosis not present

## 2013-07-30 DIAGNOSIS — Z9483 Pancreas transplant status: Secondary | ICD-10-CM | POA: Diagnosis not present

## 2013-07-30 DIAGNOSIS — R748 Abnormal levels of other serum enzymes: Secondary | ICD-10-CM | POA: Diagnosis not present

## 2013-07-30 DIAGNOSIS — Z940 Kidney transplant status: Secondary | ICD-10-CM | POA: Diagnosis not present

## 2013-07-30 DIAGNOSIS — T8610 Unspecified complication of kidney transplant: Secondary | ICD-10-CM | POA: Diagnosis not present

## 2013-07-30 DIAGNOSIS — F17200 Nicotine dependence, unspecified, uncomplicated: Secondary | ICD-10-CM | POA: Diagnosis not present

## 2013-07-30 DIAGNOSIS — N186 End stage renal disease: Secondary | ICD-10-CM | POA: Diagnosis not present

## 2013-07-30 DIAGNOSIS — T86899 Unspecified complication of other transplanted tissue: Secondary | ICD-10-CM | POA: Diagnosis not present

## 2013-07-30 DIAGNOSIS — Z79899 Other long term (current) drug therapy: Secondary | ICD-10-CM | POA: Diagnosis not present

## 2013-07-30 DIAGNOSIS — Z48298 Encounter for aftercare following other organ transplant: Secondary | ICD-10-CM | POA: Diagnosis not present

## 2013-07-31 DIAGNOSIS — Z79899 Other long term (current) drug therapy: Secondary | ICD-10-CM | POA: Diagnosis not present

## 2013-07-31 DIAGNOSIS — T8610 Unspecified complication of kidney transplant: Secondary | ICD-10-CM | POA: Diagnosis not present

## 2013-07-31 DIAGNOSIS — F17200 Nicotine dependence, unspecified, uncomplicated: Secondary | ICD-10-CM | POA: Diagnosis not present

## 2013-07-31 DIAGNOSIS — R748 Abnormal levels of other serum enzymes: Secondary | ICD-10-CM | POA: Diagnosis not present

## 2013-07-31 DIAGNOSIS — E109 Type 1 diabetes mellitus without complications: Secondary | ICD-10-CM | POA: Diagnosis not present

## 2013-07-31 DIAGNOSIS — T86899 Unspecified complication of other transplanted tissue: Secondary | ICD-10-CM | POA: Diagnosis not present

## 2013-08-02 DIAGNOSIS — I10 Essential (primary) hypertension: Secondary | ICD-10-CM | POA: Diagnosis not present

## 2013-08-02 DIAGNOSIS — Z9483 Pancreas transplant status: Secondary | ICD-10-CM | POA: Diagnosis not present

## 2013-08-02 DIAGNOSIS — Z940 Kidney transplant status: Secondary | ICD-10-CM | POA: Diagnosis not present

## 2013-08-02 DIAGNOSIS — D899 Disorder involving the immune mechanism, unspecified: Secondary | ICD-10-CM | POA: Diagnosis not present

## 2013-08-02 DIAGNOSIS — N529 Male erectile dysfunction, unspecified: Secondary | ICD-10-CM | POA: Diagnosis not present

## 2013-08-02 DIAGNOSIS — Z79899 Other long term (current) drug therapy: Secondary | ICD-10-CM | POA: Diagnosis not present

## 2013-08-02 DIAGNOSIS — Z48298 Encounter for aftercare following other organ transplant: Secondary | ICD-10-CM | POA: Diagnosis not present

## 2013-08-20 DIAGNOSIS — Z940 Kidney transplant status: Secondary | ICD-10-CM | POA: Diagnosis not present

## 2013-08-20 DIAGNOSIS — G541 Lumbosacral plexus disorders: Secondary | ICD-10-CM | POA: Diagnosis not present

## 2013-08-20 DIAGNOSIS — B349 Viral infection, unspecified: Secondary | ICD-10-CM | POA: Diagnosis not present

## 2013-08-20 DIAGNOSIS — Z79899 Other long term (current) drug therapy: Secondary | ICD-10-CM | POA: Diagnosis not present

## 2013-08-20 DIAGNOSIS — M545 Low back pain, unspecified: Secondary | ICD-10-CM | POA: Diagnosis not present

## 2013-08-20 DIAGNOSIS — R42 Dizziness and giddiness: Secondary | ICD-10-CM | POA: Diagnosis not present

## 2013-08-20 DIAGNOSIS — Z9483 Pancreas transplant status: Secondary | ICD-10-CM | POA: Diagnosis not present

## 2013-08-20 DIAGNOSIS — M5430 Sciatica, unspecified side: Secondary | ICD-10-CM | POA: Diagnosis not present

## 2013-08-20 DIAGNOSIS — M533 Sacrococcygeal disorders, not elsewhere classified: Secondary | ICD-10-CM | POA: Diagnosis not present

## 2013-08-20 DIAGNOSIS — G608 Other hereditary and idiopathic neuropathies: Secondary | ICD-10-CM | POA: Diagnosis not present

## 2013-08-20 DIAGNOSIS — Z48298 Encounter for aftercare following other organ transplant: Secondary | ICD-10-CM | POA: Diagnosis not present

## 2013-08-20 DIAGNOSIS — I10 Essential (primary) hypertension: Secondary | ICD-10-CM | POA: Diagnosis not present

## 2013-08-29 DIAGNOSIS — Z9483 Pancreas transplant status: Secondary | ICD-10-CM | POA: Diagnosis not present

## 2013-08-29 DIAGNOSIS — Z48298 Encounter for aftercare following other organ transplant: Secondary | ICD-10-CM | POA: Diagnosis not present

## 2013-08-29 DIAGNOSIS — Z940 Kidney transplant status: Secondary | ICD-10-CM | POA: Diagnosis not present

## 2013-08-29 DIAGNOSIS — D72829 Elevated white blood cell count, unspecified: Secondary | ICD-10-CM | POA: Diagnosis not present

## 2013-09-03 ENCOUNTER — Ambulatory Visit: Payer: Medicare Other | Attending: Anesthesiology | Admitting: Physical Therapy

## 2013-09-03 DIAGNOSIS — Z940 Kidney transplant status: Secondary | ICD-10-CM | POA: Diagnosis not present

## 2013-09-03 DIAGNOSIS — M545 Low back pain, unspecified: Secondary | ICD-10-CM | POA: Diagnosis not present

## 2013-09-03 DIAGNOSIS — E119 Type 2 diabetes mellitus without complications: Secondary | ICD-10-CM | POA: Insufficient documentation

## 2013-09-03 DIAGNOSIS — Z9483 Pancreas transplant status: Secondary | ICD-10-CM | POA: Insufficient documentation

## 2013-09-03 DIAGNOSIS — I10 Essential (primary) hypertension: Secondary | ICD-10-CM | POA: Diagnosis not present

## 2013-09-03 DIAGNOSIS — M2560 Stiffness of unspecified joint, not elsewhere classified: Secondary | ICD-10-CM | POA: Insufficient documentation

## 2013-09-03 DIAGNOSIS — D899 Disorder involving the immune mechanism, unspecified: Secondary | ICD-10-CM | POA: Diagnosis not present

## 2013-09-03 DIAGNOSIS — F17200 Nicotine dependence, unspecified, uncomplicated: Secondary | ICD-10-CM | POA: Diagnosis not present

## 2013-09-03 DIAGNOSIS — B349 Viral infection, unspecified: Secondary | ICD-10-CM | POA: Diagnosis not present

## 2013-09-03 DIAGNOSIS — R293 Abnormal posture: Secondary | ICD-10-CM | POA: Diagnosis not present

## 2013-09-03 DIAGNOSIS — Z48298 Encounter for aftercare following other organ transplant: Secondary | ICD-10-CM | POA: Diagnosis not present

## 2013-09-03 DIAGNOSIS — IMO0001 Reserved for inherently not codable concepts without codable children: Secondary | ICD-10-CM | POA: Diagnosis not present

## 2013-09-03 DIAGNOSIS — D751 Secondary polycythemia: Secondary | ICD-10-CM | POA: Diagnosis not present

## 2013-09-09 ENCOUNTER — Ambulatory Visit: Payer: Medicare Other | Attending: Anesthesiology | Admitting: Physical Therapy

## 2013-09-09 DIAGNOSIS — M2560 Stiffness of unspecified joint, not elsewhere classified: Secondary | ICD-10-CM | POA: Insufficient documentation

## 2013-09-09 DIAGNOSIS — E119 Type 2 diabetes mellitus without complications: Secondary | ICD-10-CM | POA: Insufficient documentation

## 2013-09-09 DIAGNOSIS — IMO0001 Reserved for inherently not codable concepts without codable children: Secondary | ICD-10-CM | POA: Diagnosis not present

## 2013-09-09 DIAGNOSIS — M545 Low back pain, unspecified: Secondary | ICD-10-CM | POA: Insufficient documentation

## 2013-09-09 DIAGNOSIS — Z940 Kidney transplant status: Secondary | ICD-10-CM | POA: Diagnosis not present

## 2013-09-09 DIAGNOSIS — R293 Abnormal posture: Secondary | ICD-10-CM | POA: Insufficient documentation

## 2013-09-09 DIAGNOSIS — Z9483 Pancreas transplant status: Secondary | ICD-10-CM | POA: Insufficient documentation

## 2013-09-09 DIAGNOSIS — I10 Essential (primary) hypertension: Secondary | ICD-10-CM | POA: Diagnosis not present

## 2013-09-11 ENCOUNTER — Ambulatory Visit: Payer: Medicare Other | Admitting: Physical Therapy

## 2013-09-11 DIAGNOSIS — IMO0001 Reserved for inherently not codable concepts without codable children: Secondary | ICD-10-CM | POA: Diagnosis not present

## 2013-09-17 ENCOUNTER — Ambulatory Visit: Payer: Medicare Other | Admitting: Physical Therapy

## 2013-09-17 DIAGNOSIS — IMO0001 Reserved for inherently not codable concepts without codable children: Secondary | ICD-10-CM | POA: Diagnosis not present

## 2013-09-17 DIAGNOSIS — D751 Secondary polycythemia: Secondary | ICD-10-CM | POA: Diagnosis not present

## 2013-09-17 DIAGNOSIS — Z9483 Pancreas transplant status: Secondary | ICD-10-CM | POA: Diagnosis not present

## 2013-09-17 DIAGNOSIS — K5900 Constipation, unspecified: Secondary | ICD-10-CM | POA: Diagnosis not present

## 2013-09-17 DIAGNOSIS — E119 Type 2 diabetes mellitus without complications: Secondary | ICD-10-CM | POA: Diagnosis not present

## 2013-09-17 DIAGNOSIS — F17200 Nicotine dependence, unspecified, uncomplicated: Secondary | ICD-10-CM | POA: Diagnosis not present

## 2013-09-17 DIAGNOSIS — M5137 Other intervertebral disc degeneration, lumbosacral region: Secondary | ICD-10-CM | POA: Diagnosis not present

## 2013-09-17 DIAGNOSIS — Z940 Kidney transplant status: Secondary | ICD-10-CM | POA: Diagnosis not present

## 2013-09-17 DIAGNOSIS — M533 Sacrococcygeal disorders, not elsewhere classified: Secondary | ICD-10-CM | POA: Diagnosis not present

## 2013-09-17 DIAGNOSIS — Z79899 Other long term (current) drug therapy: Secondary | ICD-10-CM | POA: Diagnosis not present

## 2013-09-17 DIAGNOSIS — I10 Essential (primary) hypertension: Secondary | ICD-10-CM | POA: Diagnosis not present

## 2013-09-19 ENCOUNTER — Ambulatory Visit: Payer: Medicare Other | Admitting: Physical Therapy

## 2013-09-19 DIAGNOSIS — IMO0001 Reserved for inherently not codable concepts without codable children: Secondary | ICD-10-CM | POA: Diagnosis not present

## 2013-09-24 ENCOUNTER — Encounter: Payer: Medicare Other | Admitting: Physical Therapy

## 2013-10-01 ENCOUNTER — Ambulatory Visit: Payer: Medicare Other | Admitting: Physical Therapy

## 2013-10-03 ENCOUNTER — Encounter: Payer: Medicare Other | Admitting: Physical Therapy

## 2013-10-09 ENCOUNTER — Ambulatory Visit: Payer: Medicare Other | Attending: Anesthesiology

## 2013-10-09 DIAGNOSIS — M2560 Stiffness of unspecified joint, not elsewhere classified: Secondary | ICD-10-CM | POA: Insufficient documentation

## 2013-10-09 DIAGNOSIS — I10 Essential (primary) hypertension: Secondary | ICD-10-CM | POA: Insufficient documentation

## 2013-10-09 DIAGNOSIS — Z9483 Pancreas transplant status: Secondary | ICD-10-CM | POA: Diagnosis not present

## 2013-10-09 DIAGNOSIS — Z940 Kidney transplant status: Secondary | ICD-10-CM | POA: Insufficient documentation

## 2013-10-09 DIAGNOSIS — R293 Abnormal posture: Secondary | ICD-10-CM | POA: Insufficient documentation

## 2013-10-09 DIAGNOSIS — M545 Low back pain, unspecified: Secondary | ICD-10-CM | POA: Insufficient documentation

## 2013-10-09 DIAGNOSIS — E119 Type 2 diabetes mellitus without complications: Secondary | ICD-10-CM | POA: Diagnosis not present

## 2013-10-09 DIAGNOSIS — Z79899 Other long term (current) drug therapy: Secondary | ICD-10-CM | POA: Diagnosis not present

## 2013-10-09 DIAGNOSIS — B349 Viral infection, unspecified: Secondary | ICD-10-CM | POA: Diagnosis not present

## 2013-10-09 DIAGNOSIS — F17200 Nicotine dependence, unspecified, uncomplicated: Secondary | ICD-10-CM | POA: Diagnosis not present

## 2013-10-09 DIAGNOSIS — Z48298 Encounter for aftercare following other organ transplant: Secondary | ICD-10-CM | POA: Diagnosis not present

## 2013-10-09 DIAGNOSIS — E872 Acidosis, unspecified: Secondary | ICD-10-CM | POA: Diagnosis not present

## 2013-10-09 DIAGNOSIS — IMO0001 Reserved for inherently not codable concepts without codable children: Secondary | ICD-10-CM | POA: Diagnosis not present

## 2013-10-09 DIAGNOSIS — D751 Secondary polycythemia: Secondary | ICD-10-CM | POA: Diagnosis not present

## 2013-10-09 DIAGNOSIS — G609 Hereditary and idiopathic neuropathy, unspecified: Secondary | ICD-10-CM | POA: Diagnosis not present

## 2013-10-14 ENCOUNTER — Other Ambulatory Visit: Payer: Self-pay | Admitting: Internal Medicine

## 2013-10-14 NOTE — Telephone Encounter (Signed)
Last OV and Rx filled 10/14 with 2 refills--please advise

## 2013-10-14 NOTE — Telephone Encounter (Signed)
He will need OV for further refills (after today). If [facility] not [facility] to [facility], he should try to get a new PCP at one of the [facility] [place].

## 2013-10-15 DIAGNOSIS — M545 Low back pain, unspecified: Secondary | ICD-10-CM | POA: Diagnosis not present

## 2013-10-15 DIAGNOSIS — M533 Sacrococcygeal disorders, not elsewhere classified: Secondary | ICD-10-CM | POA: Diagnosis not present

## 2013-10-15 DIAGNOSIS — M5430 Sciatica, unspecified side: Secondary | ICD-10-CM | POA: Diagnosis not present

## 2013-10-15 DIAGNOSIS — M5137 Other intervertebral disc degeneration, lumbosacral region: Secondary | ICD-10-CM | POA: Diagnosis not present

## 2013-10-15 DIAGNOSIS — Z79899 Other long term (current) drug therapy: Secondary | ICD-10-CM | POA: Diagnosis not present

## 2013-10-15 NOTE — Telephone Encounter (Signed)
Pt is aware as instructed and states he will call [place] office to make an appt

## 2013-10-22 ENCOUNTER — Ambulatory Visit: Payer: Medicare Other | Admitting: Physical Therapy

## 2013-10-22 DIAGNOSIS — Z940 Kidney transplant status: Secondary | ICD-10-CM | POA: Diagnosis not present

## 2013-10-22 DIAGNOSIS — Z9483 Pancreas transplant status: Secondary | ICD-10-CM | POA: Diagnosis not present

## 2013-10-22 DIAGNOSIS — Z48298 Encounter for aftercare following other organ transplant: Secondary | ICD-10-CM | POA: Diagnosis not present

## 2013-11-12 DIAGNOSIS — Z79899 Other long term (current) drug therapy: Secondary | ICD-10-CM | POA: Diagnosis not present

## 2013-11-12 DIAGNOSIS — D899 Disorder involving the immune mechanism, unspecified: Secondary | ICD-10-CM | POA: Diagnosis not present

## 2013-11-12 DIAGNOSIS — M545 Low back pain, unspecified: Secondary | ICD-10-CM | POA: Diagnosis not present

## 2013-11-12 DIAGNOSIS — I158 Other secondary hypertension: Secondary | ICD-10-CM | POA: Diagnosis not present

## 2013-11-12 DIAGNOSIS — F17200 Nicotine dependence, unspecified, uncomplicated: Secondary | ICD-10-CM | POA: Diagnosis not present

## 2013-11-12 DIAGNOSIS — E1029 Type 1 diabetes mellitus with other diabetic kidney complication: Secondary | ICD-10-CM | POA: Diagnosis not present

## 2013-11-12 DIAGNOSIS — E872 Acidosis, unspecified: Secondary | ICD-10-CM | POA: Diagnosis not present

## 2013-11-12 DIAGNOSIS — Z9483 Pancreas transplant status: Secondary | ICD-10-CM | POA: Diagnosis not present

## 2013-11-12 DIAGNOSIS — Z01818 Encounter for other preprocedural examination: Secondary | ICD-10-CM | POA: Diagnosis not present

## 2013-11-12 DIAGNOSIS — D751 Secondary polycythemia: Secondary | ICD-10-CM | POA: Diagnosis not present

## 2013-11-12 DIAGNOSIS — N529 Male erectile dysfunction, unspecified: Secondary | ICD-10-CM | POA: Diagnosis not present

## 2013-11-12 DIAGNOSIS — M533 Sacrococcygeal disorders, not elsewhere classified: Secondary | ICD-10-CM | POA: Diagnosis not present

## 2013-11-12 DIAGNOSIS — Z940 Kidney transplant status: Secondary | ICD-10-CM | POA: Diagnosis not present

## 2013-11-12 DIAGNOSIS — M5137 Other intervertebral disc degeneration, lumbosacral region: Secondary | ICD-10-CM | POA: Diagnosis not present

## 2013-11-12 DIAGNOSIS — D72829 Elevated white blood cell count, unspecified: Secondary | ICD-10-CM | POA: Diagnosis not present

## 2013-11-12 DIAGNOSIS — G238 Other specified degenerative diseases of basal ganglia: Secondary | ICD-10-CM | POA: Diagnosis not present

## 2013-11-12 DIAGNOSIS — Z48298 Encounter for aftercare following other organ transplant: Secondary | ICD-10-CM | POA: Diagnosis not present

## 2013-11-12 DIAGNOSIS — B349 Viral infection, unspecified: Secondary | ICD-10-CM | POA: Diagnosis not present

## 2013-11-27 ENCOUNTER — Ambulatory Visit (INDEPENDENT_AMBULATORY_CARE_PROVIDER_SITE_OTHER): Payer: Medicare Other | Admitting: Family Medicine

## 2013-11-27 VITALS — BP 110/60 | HR 83 | Temp 98.4°F | Resp 16 | Ht 66.0 in | Wt 151.0 lb

## 2013-11-27 DIAGNOSIS — M545 Low back pain, unspecified: Secondary | ICD-10-CM | POA: Diagnosis not present

## 2013-11-27 DIAGNOSIS — G8929 Other chronic pain: Secondary | ICD-10-CM

## 2013-11-27 DIAGNOSIS — G609 Hereditary and idiopathic neuropathy, unspecified: Secondary | ICD-10-CM | POA: Diagnosis not present

## 2013-11-27 DIAGNOSIS — F17200 Nicotine dependence, unspecified, uncomplicated: Secondary | ICD-10-CM | POA: Diagnosis not present

## 2013-11-27 DIAGNOSIS — Z720 Tobacco use: Secondary | ICD-10-CM

## 2013-11-27 DIAGNOSIS — M25569 Pain in unspecified knee: Secondary | ICD-10-CM

## 2013-11-27 DIAGNOSIS — G629 Polyneuropathy, unspecified: Secondary | ICD-10-CM

## 2013-11-27 MED ORDER — GABAPENTIN 300 MG PO CAPS
300.0000 mg | ORAL_CAPSULE | Freq: Every day | ORAL | Status: DC
Start: 2013-11-27 — End: 2015-11-24

## 2013-11-27 MED ORDER — VARENICLINE TARTRATE 0.5 MG X 11 & 1 MG X 42 PO MISC: ORAL | Status: DC

## 2013-11-27 MED ORDER — VARENICLINE TARTRATE 1 MG PO TABS: 1.0000 mg | ORAL_TABLET | Freq: Two times a day (BID) | ORAL | Status: DC

## 2013-11-27 NOTE — Patient Instructions (Signed)
Call your primary care doctor about coming off the Wellbutrin to make sure you don't need to either still be on this, or if not may need to wean off this to lessen risk of side effects or reactions.  [facility] offers smoking cessation clinics. Registration is required. To register call [phone] or register online at [email or web address]. You can start the Chantix for smoking cessation as discussed. If any change in depression, anxiety symptoms, or new/worsening symptoms - return here or emergency room to discuss.  Talk to your pain management doctor about refilling your Neurontin in the future and let them know about the sedation you are having. Return to the clinic or go to the nearest emergency room if any of your symptoms worsen or new symptoms occur.  Varenicline oral tablets What is this medicine? VARENICLINE (var EN i kleen) is used to help people quit smoking. It can reduce the symptoms caused by stopping smoking. It is used with a patient support program recommended by your physician. This medicine may be used for other purposes; ask your health care provider or pharmacist if you have questions. COMMON BRAND NAME(S): Chantix What should I tell my health care provider before I take this medicine? They need to know if you have any of these conditions: -bipolar disorder, depression, schizophrenia or other mental illness -heart disease -if you often drink alcohol -kidney disease -peripheral vascular disease -seizures -stroke -suicidal thoughts, plans, or attempt; a previous suicide attempt by you or a family member -an unusual or allergic reaction to varenicline, other medicines, foods, dyes, or preservatives -pregnant or trying to get pregnant -breast-feeding How should I use this medicine? You should set a date to stop smoking and tell your doctor. Start this medicine one week before the quit date. You can also start taking this medicine before you choose a quit date, and then pick a  quit date that is between 8 and 35 days of treatment with this medicine. Stick to your plan; ask about support groups or other ways to help you remain a 'quitter'. Take this medicine by mouth after eating. Take with a full glass of water. Follow the directions on the prescription label. Take your doses at regular intervals. Do not take your medicine more often than directed. A special MedGuide will be given to you by the pharmacist with each prescription and refill. Be sure to read this information carefully each time. Talk to your pediatrician regarding the use of this medicine in children. This medicine is not approved for use in children. Overdosage: If you think you have taken too much of this medicine contact a poison control center or emergency room at once. NOTE: This medicine is only for you. Do not share this medicine with others. What if I miss a dose? If you miss a dose, take it as soon as you can. If it is almost time for your next dose, take only that dose. Do not take double or extra doses. What may interact with this medicine? -alcohol or any product that contains alcohol -insulin -other stop smoking aids -theophylline -warfarin This list may not describe all possible interactions. Give your health care provider a list of all the medicines, herbs, non-prescription drugs, or dietary supplements you use. Also tell them if you smoke, drink alcohol, or use illegal drugs. Some items may interact with your medicine. What should I watch for while using this medicine? Visit your doctor or health care professional for regular check ups. Ask for ongoing advice and encouragement  from your doctor or healthcare professional, friends, and family to help you quit. If you smoke while on this medication, quit again Your mouth may get dry. Chewing sugarless gum or sucking hard candy, and drinking plenty of water may help. Contact your doctor if the problem does not go away or is severe. You may get  drowsy or dizzy. Do not drive, use machinery, or do anything that needs mental alertness until you know how this medicine affects you. Do not stand or sit up quickly, especially if you are an older patient. This reduces the risk of dizzy or fainting spells. The use of this medicine may increase the chance of suicidal thoughts or actions. Pay special attention to how you are responding while on this medicine. Any worsening of mood, or thoughts of suicide or dying should be reported to your health care professional right away. What side effects may I notice from receiving this medicine? Side effects that you should report to your doctor or health care professional as soon as possible: -allergic reactions like skin rash, itching or hives, swelling of the face, lips, tongue, or throat -breathing problems -changes in vision -chest pain or chest tightness -confusion, trouble speaking or understanding -fast, irregular heartbeat -feeling faint or lightheaded, falls -fever -pain in legs when walking -problems with balance, talking, walking -redness, blistering, peeling or loosening of the skin, including inside the mouth -ringing in ears -seizures -sudden numbness or weakness of the face, arm or leg -suicidal thoughts or other mood changes -trouble passing urine or change in the amount of urine -unusual bleeding or bruising -unusually weak or tired Side effects that usually do not require medical attention (report to your doctor or health care professional if they continue or are bothersome): -constipation -headache -nausea, vomiting -strange dreams -stomach gas -trouble sleeping This list may not describe all possible side effects. Call your doctor for medical advice about side effects. You may report side effects to FDA at 1-800-FDA-1088. Where should I keep my medicine? Keep out of the reach of children. Store at room temperature between 15 and 30 degrees C (59 and 86 degrees F). Throw away  any unused medicine after the expiration date. NOTE: This sheet is a summary. It may not cover all possible information. If you have questions about this medicine, talk to your doctor, pharmacist, or health care provider.  2014, [employer]. (2013-05-06 13:37:47)

## 2013-11-27 NOTE — Progress Notes (Addendum)
Subjective:   This chart was scribed for Mark StaggersJeffrey Brithany Whitworth, MD by Arlan OrganAshley Leger, [facility] Scribe. This patient was seen in room 5 and the patient's care was started 4:28 PM.    Patient ID: Mark LlamasAgustin Miranda, male    DOB: 1971-07-28, 43 y.o.   MRN: [id number]  HPI  HPI Comments: Mark Miranda is a 43 y.o. male new patient with a PMHx of Type 1 diabetes, ESRD, and other medical problems per list below who presents to [facility] here for Gabapentin medication refill today. Pt is currently on Gabapentin 300 mg For diabetic neuropathy. He reports ongoing pain to his lower extremities he attributes with his neuropathy. States he takes 2 pills at night time most days throughout the week. He is a former pt of Dr. [name] at [facility].  States his current PCP has moved to [place] and was not able to follow up with a new PCP for another 3 months. Denies currently being on any diabetes medications. 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 States [facility]. He is also followed by a pain clinic for lower back pain. Currently taking Oxycodone 10 mg every 4 hours. As a result, pt admits to ongoing fatigue he associated with oxycodone.  He is also requesting something to help him stop smoking. Pt stated he was prescribed Wellbutrin to help with smoking cessation after transplant. He states this medication has not been effective for him. States he has not tried anything to help with cravings besides Wellbutrin medication. He admits to an increase in cigarette use since surgery. Currently pt is smoking about 1.5-2 packs of cigarettes daily. Denies any anxiety. However, he admits to mild depression at times. Denies any severe or mild depression when initially prescribed Wellbutrin. No SI/HI. No alcohol use at this time. Pt has no other concerns at this time.   Patient Active Problem List   Diagnosis Date  Noted  . Peritoneal dialysis catheter dysfunction s/p lap repositioning [id number] 02/08/2012  . Leukocytosis 01/06/2012  . Hypoglycemia 01/06/2012  . Uremia 01/06/2012  . End stage renal disease 12/29/2011  . HTN (hypertension) 01/25/2011  . DM (diabetes mellitus) type I uncontrolled with renal manifestation 01/25/2011  . Kidney disease or stones 01/25/2011   Past Medical History  Diagnosis Date  . Diabetes mellitus   . Renal disorder   . Hypertension   . Hypercholesterolemia   . Depression   . Migraines    Past Surgical History  Procedure Laterality Date  . Peritoneal catheter insertion    . Capd insertion  01/09/2012    Procedure: CONTINUOUS AMBULATORY PERITONEAL DIALYSIS  (CAPD) CATHETER INSERTION;  Surgeon: Ernestene MentionHaywood M Ingram, MD;  [facility]: [facility] OR;  Service: General;  Laterality: N/A;  Exteriorization PD Cath  . Insertion of dialysis catheter  01/13/2012    Procedure: INSERTION OF DIALYSIS CATHETER;  Surgeon: Larina Earthlyodd F Early, MD;  [facility];  Service: Vascular;  Laterality: Right;  Internal Jugular  . Av fistula placement  01/10/2012    Procedure: ARTERIOVENOUS (AV) FISTULA CREATION;  Surgeon: Sherren Kernsharles E Fields, MD;  [facility];  Service: Vascular;  Laterality: Left;  Left Brachial Cephalic Arteriovenous Fistula  . Laparoscopy  03/13/2012    Procedure: LAPAROSCOPY DIAGNOSTIC;  Surgeon: Ardeth SportsmanSteven C. Gross, MD;  Location: [facility] OR;  Service: General;  Laterality: N/A;   No Known Allergies Prior to Admission medications   Medication Sig  Start Date End Date Taking? Authorizing Provider  aspirin 81 MG tablet Take 81 mg by mouth daily.   Yes Historical Provider, MD  buPROPion (WELLBUTRIN XL) 150 MG 24 hr tablet TAKE ONE TABLET BY MOUTH ONCE DAILY 10/14/13  Yes Nicki Reaper, NP  cyclobenzaprine (FLEXERIL) 10 MG tablet Take 1 tablet (10 mg total) by mouth 3 (three) times daily as needed for muscle spasms. 05/20/13  Yes Nicki Reaper, NP  docusate sodium (COLACE) 100 MG capsule Take 100 mg by  mouth 2 (two) times daily.   Yes Historical Provider, MD  fludrocortisone (FLORINEF) 0.1 MG tablet Take 0.1 mg by mouth daily.   Yes Historical Provider, MD  gabapentin (NEURONTIN) 300 MG capsule Take 300 mg by mouth at bedtime.   Yes Historical Provider, MD  magnesium oxide (MAG-OX) 400 MG tablet Take 400 mg by mouth daily.   Yes Historical Provider, MD  metoCLOPramide (REGLAN) 5 MG tablet Take 5 mg by mouth 4 (four) times daily.   Yes Historical Provider, MD  mycophenolate (MYFORTIC) 180 MG EC tablet Take 180 mg by mouth 2 (two) times daily.   Yes Historical Provider, MD  omeprazole (PRILOSEC) 20 MG capsule Take 20 mg by mouth daily.   Yes Historical Provider, MD  polyethylene glycol (MIRALAX / GLYCOLAX) packet Take 17 g by mouth daily.   Yes Historical Provider, MD  potassium phosphate, monobasic, (K-PHOS ORIGINAL) 500 MG tablet Take 500 mg by mouth daily.   Yes Historical Provider, MD  senna (SENOKOT) 8.6 MG TABS tablet Take 1 tablet by mouth daily.   Yes Historical Provider, MD  sulfamethoxazole-trimethoprim (BACTRIM DS) 800-160 MG per tablet Take 1 tablet by mouth once.   Yes Historical Provider, MD  tacrolimus (PROGRAF) 1 MG capsule Take 1 mg by mouth 2 (two) times daily.   Yes Historical Provider, MD  valGANciclovir (VALCYTE) 450 MG tablet Take 450 mg by mouth daily.   Yes Historical Provider, MD   History   Social History  . Marital Status: Married    Spouse Name: N/A    Number of Children: N/A  . Years of Education: 10   Occupational History  . [employer]    Social History Main Topics  . Smoking status: Current Every Day Smoker -- 1.00 packs/day    Types: Cigarettes  . Smokeless tobacco: Never Used  . Alcohol Use: No  . Drug Use: No  . Sexual Activity: Not on file   Other Topics Concern  . Not on file   Social History Narrative   Regular exercise-yes   Caffeine Use-no      Review of Systems  Constitutional: Positive for fatigue. Negative for unexpected weight change.   Eyes: Negative for visual disturbance.  Respiratory: Negative for cough, chest tightness and shortness of breath.   Cardiovascular: Negative for chest pain, palpitations and leg swelling.  Gastrointestinal: Negative for abdominal pain and blood in stool.  Musculoskeletal: Positive for myalgias.  Neurological: Negative for dizziness, light-headedness and headaches.  Psychiatric/Behavioral: Negative for suicidal ideas and self-injury.    Filed Vitals:   11/27/13 1515  BP: 110/60  Pulse: 83  Temp: 98.4 F (36.9 C)  Resp: 16  Height: 5\' 6"  (1.676 m)  Weight: 151 lb (68.493 kg)  SpO2: 97%    Objective:  Physical Exam  Vitals reviewed. Constitutional: He is oriented to person, place, and time. He appears well-developed and well-nourished.  HENT:  Head: Normocephalic and atraumatic.  Eyes: EOM are normal. Pupils are equal, round, and reactive to  light.  Neck: No JVD present. Carotid bruit is not present.  Cardiovascular: Normal rate, regular rhythm and normal heart sounds.   No murmur heard. Pulmonary/Chest: Effort normal and breath sounds normal. He has no rales.  Musculoskeletal: He exhibits no edema.  No focal ttp but describes medial lower legs to feet as areas pain presents.   Neurological: He is alert and oriented to person, place, and time. He has normal strength.  Skin: Skin is warm and dry.  Psychiatric: He has a normal mood and affect. His speech is normal and behavior is normal. He expresses no suicidal ideation. He expresses no suicidal plans.    Assessment & Plan:   Mark Miranda is a 43 y.o. male Chronic LBP - Plan: gabapentin (NEURONTIN) 300 MG capsule, Peripheral neuropathy - Plan: gabapentin (NEURONTIN) 300 MG capsule, Pain in joint, lower leg - Plan: gabapentin (NEURONTIN) 300 MG capsule  - may be component of both peripheral neuropathy and chronic LBP/disc dz by hx.  Will refill neurontin at same doses, but advised to discuss this medicine with his pain mgt  provider, and primary care as should be able to have those filled there.  Should also discuss sedation with his other medicines - suspect oxycodone culprit - but to discuss with pain mgt provider.   Tobacco abuse - Plan: varenicline (CHANTIX STARTING MONTH PAK) 0.5 MG X 11 & 1 MG X 42 tablet, varenicline (CHANTIX CONTINUING MONTH PAK) 1 MG tablet -   -discussed Chantix and smoking cessation clinic. SED and precautions with Chantix as below. Denies true depression, but some depressive mood sx's vs. Fatigue - will continue Wellbutrin until he can discuss this with PCP, then may need taper off.   rtc precautions.    Meds ordered this encounter  Medications  . Oxycodone HCl 10 MG TABS    Sig: Take 10 mg by mouth every 6 (six) hours as needed.  . gabapentin (NEURONTIN) 300 MG capsule    Sig: Take 1-2 capsules (300-600 mg total) by mouth at bedtime.    Dispense:  60 capsule    Refill:  2  . varenicline (CHANTIX STARTING MONTH PAK) 0.5 MG X 11 & 1 MG X 42 tablet    Sig: Take one 0.5 mg tablet by mouth once daily for 3 days, then increase to one 0.5 mg tablet twice daily for 4 days, then increase to one 1 mg tablet twice daily.    Dispense:  53 tablet    Refill:  0  . varenicline (CHANTIX CONTINUING MONTH PAK) 1 MG tablet    Sig: Take 1 tablet (1 mg total) by mouth 2 (two) times daily.    Dispense:  60 tablet    Refill:  2   Patient Instructions  Call your primary care doctor about coming off the Wellbutrin to make sure you don't need to either still be on this, or if not may need to wean off this to lessen risk of side effects or reactions.  [place] offers smoking cessation clinics. Registration is required. To register call [phone] or register online at [email or web address]. You can start the Chantix for smoking cessation as discussed. If any change in depression, anxiety symptoms, or new/worsening symptoms - return here or emergency room to discuss.  Talk to your pain management doctor  about refilling your Neurontin in the future and let them know about the sedation you are having. Return to the clinic or go to the nearest emergency room if any of your symptoms worsen  or new symptoms occur.  Varenicline oral tablets What is this medicine? VARENICLINE (var EN i kleen) is used to help people quit smoking. It can reduce the symptoms caused by stopping smoking. It is used with a patient support program recommended by your physician. This medicine may be used for other purposes; ask your health care provider or pharmacist if you have questions. COMMON BRAND NAME(S): Chantix What should I tell my health care provider before I take this medicine? They need to know if you have any of these conditions: -bipolar disorder, depression, schizophrenia or other mental illness -heart disease -if you often drink alcohol -kidney disease -peripheral vascular disease -seizures -stroke -suicidal thoughts, plans, or attempt; a previous suicide attempt by you or a family member -an unusual or allergic reaction to varenicline, other medicines, foods, dyes, or preservatives -pregnant or trying to get pregnant -breast-feeding How should I use this medicine? You should set a date to stop smoking and tell your doctor. Start this medicine one week before the quit date. You can also start taking this medicine before you choose a quit date, and then pick a quit date that is between 8 and 35 days of treatment with this medicine. Stick to your plan; ask about support groups or other ways to help you remain a 'quitter'. Take this medicine by mouth after eating. Take with a full glass of water. Follow the directions on the prescription label. Take your doses at regular intervals. Do not take your medicine more often than directed. A special MedGuide will be given to you by the pharmacist with each prescription and refill. Be sure to read this information carefully each time. Talk to your pediatrician  regarding the use of this medicine in children. This medicine is not approved for use in children. Overdosage: If you think you have taken too much of this medicine contact a poison control center or emergency room at once. NOTE: This medicine is only for you. Do not share this medicine with others. What if I miss a dose? If you miss a dose, take it as soon as you can. If it is almost time for your next dose, take only that dose. Do not take double or extra doses. What may interact with this medicine? -alcohol or any product that contains alcohol -insulin -other stop smoking aids -theophylline -warfarin This list may not describe all possible interactions. Give your health care provider a list of all the medicines, herbs, non-prescription drugs, or dietary supplements you use. Also tell them if you smoke, drink alcohol, or use illegal drugs. Some items may interact with your medicine. What should I watch for while using this medicine? Visit your doctor or health care professional for regular check ups. Ask for ongoing advice and encouragement from your doctor or healthcare professional, friends, and family to help you quit. If you smoke while on this medication, quit again Your mouth may get dry. Chewing sugarless gum or sucking hard candy, and drinking plenty of water may help. Contact your doctor if the problem does not go away or is severe. You may get drowsy or dizzy. Do not drive, use machinery, or do anything that needs mental alertness until you know how this medicine affects you. Do not stand or sit up quickly, especially if you are an older patient. This reduces the risk of dizzy or fainting spells. The use of this medicine may increase the chance of suicidal thoughts or actions. Pay special attention to how you are responding while on  this medicine. Any worsening of mood, or thoughts of suicide or dying should be reported to your health care professional right away. What side effects may I  notice from receiving this medicine? Side effects that you should report to your doctor or health care professional as soon as possible: -allergic reactions like skin rash, itching or hives, swelling of the face, lips, tongue, or throat -breathing problems -changes in vision -chest pain or chest tightness -confusion, trouble speaking or understanding -fast, irregular heartbeat -feeling faint or lightheaded, falls -fever -pain in legs when walking -problems with balance, talking, walking -redness, blistering, peeling or loosening of the skin, including inside the mouth -ringing in ears -seizures -sudden numbness or weakness of the face, arm or leg -suicidal thoughts or other mood changes -trouble passing urine or change in the amount of urine -unusual bleeding or bruising -unusually weak or tired Side effects that usually do not require medical attention (report to your doctor or health care professional if they continue or are bothersome): -constipation -headache -nausea, vomiting -strange dreams -stomach gas -trouble sleeping This list may not describe all possible side effects. Call your doctor for medical advice about side effects. You may report side effects to FDA at 1-800-FDA-1088. Where should I keep my medicine? Keep out of the reach of children. Store at room temperature between 15 and 30 degrees C (59 and 86 degrees F). Throw away any unused medicine after the expiration date. NOTE: This sheet is a summary. It may not cover all possible information. If you have questions about this medicine, talk to your doctor, pharmacist, or health care provider.  2014, Elsevier/Gold Standard. (2013-05-06 13:37:47)      I personally performed the services described in this documentation, which was scribed in my presence. The recorded information has been reviewed and is accurate.

## 2013-12-02 DIAGNOSIS — M545 Low back pain, unspecified: Secondary | ICD-10-CM | POA: Diagnosis not present

## 2013-12-03 DIAGNOSIS — Z9483 Pancreas transplant status: Secondary | ICD-10-CM | POA: Diagnosis not present

## 2013-12-03 DIAGNOSIS — Z940 Kidney transplant status: Secondary | ICD-10-CM | POA: Diagnosis not present

## 2013-12-03 DIAGNOSIS — E1029 Type 1 diabetes mellitus with other diabetic kidney complication: Secondary | ICD-10-CM | POA: Diagnosis not present

## 2013-12-03 DIAGNOSIS — D751 Secondary polycythemia: Secondary | ICD-10-CM | POA: Diagnosis not present

## 2013-12-03 DIAGNOSIS — D899 Disorder involving the immune mechanism, unspecified: Secondary | ICD-10-CM | POA: Diagnosis not present

## 2013-12-03 DIAGNOSIS — I10 Essential (primary) hypertension: Secondary | ICD-10-CM | POA: Diagnosis not present

## 2013-12-03 DIAGNOSIS — B349 Viral infection, unspecified: Secondary | ICD-10-CM | POA: Diagnosis not present

## 2013-12-03 DIAGNOSIS — D802 Selective deficiency of immunoglobulin A [IgA]: Secondary | ICD-10-CM | POA: Diagnosis not present

## 2013-12-03 DIAGNOSIS — F17200 Nicotine dependence, unspecified, uncomplicated: Secondary | ICD-10-CM | POA: Diagnosis not present

## 2013-12-03 DIAGNOSIS — D819 Combined immunodeficiency, unspecified: Secondary | ICD-10-CM | POA: Diagnosis not present

## 2013-12-05 ENCOUNTER — Telehealth: Payer: Self-pay | Admitting: Internal Medicine

## 2013-12-05 NOTE — Telephone Encounter (Signed)
Ok with me 

## 2013-12-05 NOTE — Telephone Encounter (Signed)
[facility] is calling in regards to finding the pt a new np, pt was seeing regina baity, she has transferred out to [place] area, pt is requesting [place] due to the fact its to far for the pt to drive to [place]. Ok to switch?

## 2013-12-10 DIAGNOSIS — M5137 Other intervertebral disc degeneration, lumbosacral region: Secondary | ICD-10-CM | POA: Diagnosis not present

## 2013-12-10 DIAGNOSIS — M545 Low back pain, unspecified: Secondary | ICD-10-CM | POA: Diagnosis not present

## 2013-12-10 DIAGNOSIS — G894 Chronic pain syndrome: Secondary | ICD-10-CM | POA: Diagnosis not present

## 2013-12-10 DIAGNOSIS — M533 Sacrococcygeal disorders, not elsewhere classified: Secondary | ICD-10-CM | POA: Diagnosis not present

## 2013-12-10 DIAGNOSIS — Z79899 Other long term (current) drug therapy: Secondary | ICD-10-CM | POA: Diagnosis not present

## 2013-12-10 NOTE — Telephone Encounter (Signed)
[place] [facility] [place] RN case manager [facility] following up on request for pt to switch providers. pls advise.

## 2013-12-11 NOTE — Telephone Encounter (Signed)
OK WITH ME 

## 2013-12-12 NOTE — Telephone Encounter (Signed)
appt scheduled

## 2013-12-12 NOTE — Telephone Encounter (Signed)
Lm for davina to cb.

## 2014-01-07 DIAGNOSIS — M545 Low back pain, unspecified: Secondary | ICD-10-CM | POA: Diagnosis not present

## 2014-01-07 DIAGNOSIS — Z48298 Encounter for aftercare following other organ transplant: Secondary | ICD-10-CM | POA: Diagnosis not present

## 2014-01-07 DIAGNOSIS — Z940 Kidney transplant status: Secondary | ICD-10-CM | POA: Diagnosis not present

## 2014-01-07 DIAGNOSIS — F17200 Nicotine dependence, unspecified, uncomplicated: Secondary | ICD-10-CM | POA: Diagnosis not present

## 2014-01-07 DIAGNOSIS — M5137 Other intervertebral disc degeneration, lumbosacral region: Secondary | ICD-10-CM | POA: Diagnosis not present

## 2014-01-07 DIAGNOSIS — D899 Disorder involving the immune mechanism, unspecified: Secondary | ICD-10-CM | POA: Diagnosis not present

## 2014-01-07 DIAGNOSIS — I10 Essential (primary) hypertension: Secondary | ICD-10-CM | POA: Diagnosis not present

## 2014-01-07 DIAGNOSIS — Z9483 Pancreas transplant status: Secondary | ICD-10-CM | POA: Diagnosis not present

## 2014-01-07 DIAGNOSIS — G894 Chronic pain syndrome: Secondary | ICD-10-CM | POA: Diagnosis not present

## 2014-01-07 DIAGNOSIS — Z79899 Other long term (current) drug therapy: Secondary | ICD-10-CM | POA: Diagnosis not present

## 2014-01-14 ENCOUNTER — Telehealth: Payer: Self-pay | Admitting: Family

## 2014-01-14 ENCOUNTER — Encounter: Payer: Self-pay | Admitting: Family

## 2014-01-14 ENCOUNTER — Ambulatory Visit (INDEPENDENT_AMBULATORY_CARE_PROVIDER_SITE_OTHER): Payer: Medicare Other | Admitting: Family

## 2014-01-14 VITALS — BP 120/76 | HR 74 | Ht 66.0 in | Wt 150.0 lb

## 2014-01-14 DIAGNOSIS — G8929 Other chronic pain: Secondary | ICD-10-CM | POA: Diagnosis not present

## 2014-01-14 DIAGNOSIS — K5909 Other constipation: Secondary | ICD-10-CM

## 2014-01-14 DIAGNOSIS — K5900 Constipation, unspecified: Secondary | ICD-10-CM

## 2014-01-14 DIAGNOSIS — M549 Dorsalgia, unspecified: Secondary | ICD-10-CM | POA: Diagnosis not present

## 2014-01-14 MED ORDER — MINERAL OIL RE ENEM
1.0000 | ENEMA | Freq: Once | RECTAL | Status: DC
Start: 2014-01-14 — End: 2015-11-02

## 2014-01-14 NOTE — Progress Notes (Signed)
Pre visit review using our clinic review tool, if applicable. No additional management support is needed unless otherwise documented below in the visit note. 

## 2014-01-14 NOTE — Patient Instructions (Signed)

## 2014-01-14 NOTE — Progress Notes (Signed)
Subjective:    Patient ID: Mark Miranda, male    DOB: 10-17-1970, 43 y.o.   MRN: [id number]  Constipation Associated symptoms include back pain.   43 y.o. Latino male presents today with chief complaint of "chronic constipation and I needs a pain clinic referrals". Pt states that he has had issues with constipation for "a long time", he takes Miralax, Senokot and fiber daily and they do not help with bowel movements. Pt state she only has bowel movements when he drinks "tea that wife has". He states his bowel movements are hard and small, denies blood. Pt also has chronic back pain which he takes 10 mg of Oxycodone 2-3 times per day. States that he use to be followed at a pain clinic but was discharged due to refusing any more back injections. Pt would like a referral to a new pain clinic for chronic pain management. Hx of pancrease and kidney transplant. Denies fever, chills, SOB, and change in appetite.     Review of Systems  Constitutional: Negative.   HENT: Negative.   Respiratory: Negative.   Cardiovascular: Negative.   Gastrointestinal: Positive for constipation and abdominal distention.  Endocrine: Negative.   Genitourinary: Negative.   Musculoskeletal: Positive for back pain.       Chronic back pain   Skin: Negative.   Allergic/Immunologic: Negative.   Neurological: Negative.   Hematological: Negative.   Psychiatric/Behavioral: Negative.    Past Medical History  Diagnosis Date  . Diabetes mellitus   . Renal disorder   . Hypertension   . Hypercholesterolemia   . Depression   . Migraines     History   Social History  . Marital Status: Married    Spouse Name: N/A    Number of Children: N/A  . 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 Smoking status: Current Every Day Smoker -- 1.00 packs/day    Types: Cigarettes  . Smokeless tobacco: Never Used  . Alcohol Use: Yes     Comment: socially  . Drug Use: No  .  Sexual Activity: Not on file   Other Topics Concern  . Not on file   Social History Narrative   Regular exercise-yes   Caffeine Use-no    Past Surgical History  Procedure Laterality Date  . Peritoneal catheter insertion    . Capd insertion  01/09/2012    Procedure: CONTINUOUS AMBULATORY PERITONEAL DIALYSIS  (CAPD) CATHETER INSERTION;  Surgeon: Ernestene MentionHaywood M Ingram, MD;  Location: [facility] OR;  Service: General;  Laterality: N/A;  Exteriorization PD Cath  . Insertion of dialysis catheter  01/13/2012    Procedure: INSERTION OF DIALYSIS CATHETER;  Surgeon: Larina Earthlyodd F Early, MD;  [facility];  Service: Vascular;  Laterality: Right;  Internal Jugular  . Av fistula placement  01/10/2012    Procedure: ARTERIOVENOUS (AV) FISTULA CREATION;  Surgeon: Sherren Kernsharles E Fields, MD;  [facility];  Service: Vascular;  Laterality: Left;  Left Brachial Cephalic Arteriovenous Fistula  . Laparoscopy  03/13/2012    Procedure: LAPAROSCOPY DIAGNOSTIC;  Surgeon: Ardeth SportsmanSteven C. Gross, MD;  [facility];  Service: General;  Laterality: N/A;    Family History  Problem Relation Age of Onset  . Diabetes Mother   . Hypertension Mother   . Hyperlipidemia Mother     No Known Allergies  Current Outpatient Prescriptions on File Prior to Visit  Medication Sig Dispense Refill  . docusate sodium (COLACE) 100 MG capsule  Take 100 mg by mouth 2 (two) times daily.      . fludrocortisone (FLORINEF) 0.1 MG tablet Take 0.1 mg by mouth daily.      Marland Kitchen gabapentin (NEURONTIN) 300 MG capsule Take 1-2 capsules (300-600 mg total) by mouth at bedtime.  60 capsule  2  . magnesium oxide (MAG-OX) 400 MG tablet Take 400 mg by mouth daily.      . metoCLOPramide (REGLAN) 5 MG tablet Take 5 mg by mouth 4 (four) times daily.      . mycophenolate (MYFORTIC) 180 MG EC tablet Take 180 mg by mouth 2 (two) times daily.      Marland Kitchen omeprazole (PRILOSEC) 20 MG capsule Take 20 mg by mouth daily.      . Oxycodone HCl 10 MG TABS Take 10 mg by mouth every 6 (six) hours as  needed.      . polyethylene glycol (MIRALAX / GLYCOLAX) packet Take 17 g by mouth daily.      . potassium phosphate, monobasic, (K-PHOS ORIGINAL) 500 MG tablet Take 500 mg by mouth daily.      Marland Kitchen senna (SENOKOT) 8.6 MG TABS tablet Take 1 tablet by mouth daily.      . tacrolimus (PROGRAF) 1 MG capsule Take 1 mg by mouth 2 (two) times daily.      . valGANciclovir (VALCYTE) 450 MG tablet Take 450 mg by mouth daily.      . varenicline (CHANTIX CONTINUING MONTH PAK) 1 MG tablet Take 1 tablet (1 mg total) by mouth 2 (two) times daily.  60 tablet  2  . varenicline (CHANTIX STARTING MONTH PAK) 0.5 MG X 11 & 1 MG X 42 tablet Take one 0.5 mg tablet by mouth once daily for 3 days, then increase to one 0.5 mg tablet twice daily for 4 days, then increase to one 1 mg tablet twice daily.  53 tablet  0  . [DISCONTINUED] diltiazem (CARDIZEM) 30 MG tablet Take 30 mg by mouth 4 (four) times daily. Ask patient to verify dosage and frequency. Not indicated on medical history form dated 10/23/10.       . [DISCONTINUED] insulin lispro (HUMALOG) 100 UNIT/ML injection Inject into the skin 3 (three) times daily before meals. Ask patient to verify name/dosage/. History form noted dosage of 34 units during day and 22 units at night.        No current facility-administered medications on file prior to visit.    BP 120/76  Pulse 74  Ht 5\' 6"  (1.676 m)  Wt 150 lb (68.04 kg)  BMI 24.22 kg/m2  SpO2 98%chart    Objective:   Physical Exam  Constitutional: He is oriented to person, place, and time. He appears well-developed and well-nourished. He is active.  Cardiovascular: Normal rate, regular rhythm, normal heart sounds and normal pulses.   Pulmonary/Chest: Effort normal and breath sounds normal.  Abdominal: Bowel sounds are normal. He exhibits distension. There is no tenderness.  Abdominal distention present. Dullness to percussion in right lower quadrant.   Neurological: He is alert and oriented to person, place, and  time.  Skin: Skin is warm, dry and intact.  Psychiatric: He has a normal mood and affect. His speech is normal and behavior is normal. Thought content normal.          Assessment & Plan:  43 y.o. latino male presents for complaints of chronic constipation and chronic back pain.  - Chronic Pain   - refer to Pain clinic for pain management.  -  Chronic Constipation   - Mineral oil enema  - continue Miralax and Senokot daily  - Refer to GI  - Educaiton: Increase fluid intake and movement to help stimulate bowel. Eat foods high in fiber. Instruction on home enema use.   - Follow up: as needed.

## 2014-01-14 NOTE — Telephone Encounter (Signed)
Relevant patient education mailed to patient.  

## 2014-02-11 DIAGNOSIS — Z940 Kidney transplant status: Secondary | ICD-10-CM | POA: Diagnosis not present

## 2014-02-11 DIAGNOSIS — Z48298 Encounter for aftercare following other organ transplant: Secondary | ICD-10-CM | POA: Diagnosis not present

## 2014-02-11 DIAGNOSIS — E872 Acidosis, unspecified: Secondary | ICD-10-CM | POA: Diagnosis not present

## 2014-02-11 DIAGNOSIS — Z9483 Pancreas transplant status: Secondary | ICD-10-CM | POA: Diagnosis not present

## 2014-02-11 DIAGNOSIS — F17200 Nicotine dependence, unspecified, uncomplicated: Secondary | ICD-10-CM | POA: Diagnosis not present

## 2014-02-11 DIAGNOSIS — R5381 Other malaise: Secondary | ICD-10-CM | POA: Diagnosis not present

## 2014-02-11 DIAGNOSIS — E109 Type 1 diabetes mellitus without complications: Secondary | ICD-10-CM | POA: Diagnosis not present

## 2014-02-11 DIAGNOSIS — Z794 Long term (current) use of insulin: Secondary | ICD-10-CM | POA: Diagnosis not present

## 2014-02-11 DIAGNOSIS — Z5181 Encounter for therapeutic drug level monitoring: Secondary | ICD-10-CM | POA: Diagnosis not present

## 2014-02-11 DIAGNOSIS — D751 Secondary polycythemia: Secondary | ICD-10-CM | POA: Diagnosis not present

## 2014-02-11 DIAGNOSIS — Z79899 Other long term (current) drug therapy: Secondary | ICD-10-CM | POA: Diagnosis not present

## 2014-02-20 ENCOUNTER — Encounter: Payer: Self-pay | Admitting: Family

## 2014-03-10 DIAGNOSIS — B349 Viral infection, unspecified: Secondary | ICD-10-CM | POA: Diagnosis not present

## 2014-03-10 DIAGNOSIS — D72829 Elevated white blood cell count, unspecified: Secondary | ICD-10-CM | POA: Diagnosis not present

## 2014-03-10 DIAGNOSIS — E872 Acidosis, unspecified: Secondary | ICD-10-CM | POA: Diagnosis not present

## 2014-03-10 DIAGNOSIS — Z9483 Pancreas transplant status: Secondary | ICD-10-CM | POA: Diagnosis not present

## 2014-03-10 DIAGNOSIS — E1029 Type 1 diabetes mellitus with other diabetic kidney complication: Secondary | ICD-10-CM | POA: Diagnosis not present

## 2014-03-10 DIAGNOSIS — D751 Secondary polycythemia: Secondary | ICD-10-CM | POA: Diagnosis not present

## 2014-03-10 DIAGNOSIS — F17200 Nicotine dependence, unspecified, uncomplicated: Secondary | ICD-10-CM | POA: Diagnosis not present

## 2014-03-10 DIAGNOSIS — Z940 Kidney transplant status: Secondary | ICD-10-CM | POA: Diagnosis not present

## 2014-03-10 DIAGNOSIS — M67919 Unspecified disorder of synovium and tendon, unspecified shoulder: Secondary | ICD-10-CM | POA: Diagnosis not present

## 2014-03-10 DIAGNOSIS — I10 Essential (primary) hypertension: Secondary | ICD-10-CM | POA: Diagnosis not present

## 2014-03-10 DIAGNOSIS — M7700 Medial epicondylitis, unspecified elbow: Secondary | ICD-10-CM | POA: Diagnosis not present

## 2014-04-18 DIAGNOSIS — N2581 Secondary hyperparathyroidism of renal origin: Secondary | ICD-10-CM | POA: Diagnosis not present

## 2014-04-18 DIAGNOSIS — D631 Anemia in chronic kidney disease: Secondary | ICD-10-CM | POA: Diagnosis not present

## 2014-04-18 DIAGNOSIS — E119 Type 2 diabetes mellitus without complications: Secondary | ICD-10-CM | POA: Diagnosis not present

## 2014-04-18 DIAGNOSIS — N039 Chronic nephritic syndrome with unspecified morphologic changes: Secondary | ICD-10-CM | POA: Diagnosis not present

## 2014-04-18 DIAGNOSIS — Z940 Kidney transplant status: Secondary | ICD-10-CM | POA: Diagnosis not present

## 2014-04-25 DIAGNOSIS — Z23 Encounter for immunization: Secondary | ICD-10-CM | POA: Diagnosis not present

## 2014-04-25 DIAGNOSIS — N181 Chronic kidney disease, stage 1: Secondary | ICD-10-CM | POA: Diagnosis not present

## 2014-04-25 DIAGNOSIS — D631 Anemia in chronic kidney disease: Secondary | ICD-10-CM | POA: Diagnosis not present

## 2014-04-25 DIAGNOSIS — Z940 Kidney transplant status: Secondary | ICD-10-CM | POA: Diagnosis not present

## 2014-04-25 DIAGNOSIS — I10 Essential (primary) hypertension: Secondary | ICD-10-CM | POA: Diagnosis not present

## 2014-05-05 DIAGNOSIS — Z940 Kidney transplant status: Secondary | ICD-10-CM | POA: Diagnosis not present

## 2014-05-05 DIAGNOSIS — Z79899 Other long term (current) drug therapy: Secondary | ICD-10-CM | POA: Diagnosis not present

## 2014-05-05 DIAGNOSIS — Z48298 Encounter for aftercare following other organ transplant: Secondary | ICD-10-CM | POA: Diagnosis not present

## 2014-08-18 DIAGNOSIS — N2581 Secondary hyperparathyroidism of renal origin: Secondary | ICD-10-CM | POA: Diagnosis not present

## 2014-08-18 DIAGNOSIS — E785 Hyperlipidemia, unspecified: Secondary | ICD-10-CM | POA: Diagnosis not present

## 2014-08-18 DIAGNOSIS — Z940 Kidney transplant status: Secondary | ICD-10-CM | POA: Diagnosis not present

## 2014-08-18 DIAGNOSIS — Z9483 Pancreas transplant status: Secondary | ICD-10-CM | POA: Diagnosis not present

## 2014-08-18 DIAGNOSIS — E559 Vitamin D deficiency, unspecified: Secondary | ICD-10-CM | POA: Diagnosis not present

## 2014-08-25 DIAGNOSIS — I10 Essential (primary) hypertension: Secondary | ICD-10-CM | POA: Diagnosis not present

## 2014-08-25 DIAGNOSIS — N2581 Secondary hyperparathyroidism of renal origin: Secondary | ICD-10-CM | POA: Diagnosis not present

## 2014-08-25 DIAGNOSIS — N181 Chronic kidney disease, stage 1: Secondary | ICD-10-CM | POA: Diagnosis not present

## 2014-08-25 DIAGNOSIS — D631 Anemia in chronic kidney disease: Secondary | ICD-10-CM | POA: Diagnosis not present

## 2014-12-04 ENCOUNTER — Encounter (HOSPITAL_COMMUNITY): Payer: Self-pay | Admitting: Emergency Medicine

## 2014-12-04 ENCOUNTER — Emergency Department (HOSPITAL_COMMUNITY)
Admission: EM | Admit: 2014-12-04 | Discharge: 2014-12-04 | Disposition: A | Discharge status: Home / Self Care | Payer: Medicare Other | Attending: Emergency Medicine | Admitting: Emergency Medicine

## 2014-12-04 DIAGNOSIS — Z720 Tobacco use: Secondary | ICD-10-CM | POA: Insufficient documentation

## 2014-12-04 DIAGNOSIS — E119 Type 2 diabetes mellitus without complications: Secondary | ICD-10-CM | POA: Diagnosis not present

## 2014-12-04 DIAGNOSIS — Z79899 Other long term (current) drug therapy: Secondary | ICD-10-CM | POA: Insufficient documentation

## 2014-12-04 DIAGNOSIS — I10 Essential (primary) hypertension: Secondary | ICD-10-CM | POA: Insufficient documentation

## 2014-12-04 DIAGNOSIS — G43909 Migraine, unspecified, not intractable, without status migrainosus: Secondary | ICD-10-CM | POA: Insufficient documentation

## 2014-12-04 DIAGNOSIS — Z87448 Personal history of other diseases of urinary system: Secondary | ICD-10-CM | POA: Diagnosis not present

## 2014-12-04 DIAGNOSIS — Z7952 Long term (current) use of systemic steroids: Secondary | ICD-10-CM | POA: Diagnosis not present

## 2014-12-04 DIAGNOSIS — M79671 Pain in right foot: Secondary | ICD-10-CM | POA: Diagnosis present

## 2014-12-04 DIAGNOSIS — B029 Zoster without complications: Secondary | ICD-10-CM | POA: Diagnosis not present

## 2014-12-04 DIAGNOSIS — Z8659 Personal history of other mental and behavioral disorders: Secondary | ICD-10-CM | POA: Diagnosis not present

## 2014-12-04 DIAGNOSIS — R21 Rash and other nonspecific skin eruption: Secondary | ICD-10-CM | POA: Diagnosis not present

## 2014-12-04 MED ORDER — VALACYCLOVIR HCL 1 G PO TABS: 1000.0000 mg | ORAL_TABLET | Freq: Three times a day (TID) | ORAL | Status: DC

## 2014-12-04 NOTE — ED Notes (Signed)
Declined W/C at D/C and was escorted to lobby by RN. 

## 2014-12-04 NOTE — ED Provider Notes (Signed)
CSN: [id number]     Arrival date & time 12/04/14  2005 History  This chart was scribed for non-physician practitioner working with Blake Divine, MD by Richarda Overlie, ED Scribe. This patient was seen in room TR07C/TR07C and the patient's care was started at 9:18 PM.     Chief Complaint  Patient presents with  . Foot Pain   Patient is a 44 y.o. male presenting with rash. The history is provided by the patient. No language interpreter was used.  Rash Location:  Foot Foot rash location:  R foot Quality: blistering, painful and redness   Quality: not itchy, not swelling and not weeping   Pain details:    Quality:  Sharp   Severity:  Moderate   Onset quality:  Gradual   Duration:  3 days   Timing:  Intermittent   Progression:  Unchanged Severity:  Severe Onset quality:  Gradual Duration:  2 days Timing:  Intermittent Progression:  Unchanged Chronicity:  New Context: not animal contact, not exposure to similar rash, not medications, not new detergent/soap, not plant contact and not sick contacts   Relieved by:  OTC analgesics Worsened by:  Nothing tried Ineffective treatments:  None tried Associated symptoms: no abdominal pain, no diarrhea, no fever, no joint pain, no myalgias, no nausea, no shortness of breath and not vomiting    HPI Comments: Mark Miranda is a 44 y.o. male with a history of DM, renal disorder, HTN, and hypercholesterolemia who presents to the Emergency Department complaining of several blisters on the top of his right foot that he noticed 3 days ago. Pt reports that he has pain which is stabbing, intermittent, non-radiating and rates it as 10/10 currently. Pt states that he began experiencing right foot pain prior to the presentation of his blisters. He says he has used lidocaine cream with minimal relief. He states that he has tried to pop the blisters and reports a clear discharge afterwards. Pt states that touching the area aggravates his pain. He denies any  weakness in his right foot and says that he is still able to wiggle his toes. Pt says that he occasionally experiences a tingling sensation in his right foot as well. He states that no other family members have symptoms. Pt states he does not remember stepping on anything or any animal/plant exposures. He reports no new exposures from soaps or medications. He denies itching, red streaking, warmth, fevers, chills, CP, SOB, abdominal pain, nausea, vomiting or numbness.  Past Medical History  Diagnosis Date  . Diabetes mellitus   . Renal disorder   . Hypertension   . Hypercholesterolemia   . Depression   . Migraines    Past Surgical History  Procedure Laterality Date  . Peritoneal catheter insertion    . Capd insertion  01/09/2012    Procedure: CONTINUOUS AMBULATORY PERITONEAL DIALYSIS  (CAPD) CATHETER INSERTION;  Surgeon: Ernestene Mention, MD;  Location: [facility] OR;  Service: General;  Laterality: N/A;  Exteriorization PD Cath  . Insertion of dialysis catheter  01/13/2012    Procedure: INSERTION OF DIALYSIS CATHETER;  Surgeon: Larina Earthly, MD;  Location: [facility] OR;  Service: Vascular;  Laterality: Right;  Internal Jugular  . Av fistula placement  01/10/2012    Procedure: ARTERIOVENOUS (AV) FISTULA CREATION;  Surgeon: Sherren Kerns, MD;  Location: [facility] OR;  Service: Vascular;  Laterality: Left;  Left Brachial Cephalic Arteriovenous Fistula  . Laparoscopy  03/13/2012    Procedure: LAPAROSCOPY DIAGNOSTIC;  Surgeon: Ardeth Sportsman, MD;  Location: [facility] OR;  Service: General;  Laterality: N/A;  . Kidney transplant     Family History  Problem Relation Age of Onset  . Diabetes Mother   . Hypertension Mother   . Hyperlipidemia Mother    History  Substance Use Topics  . Smoking status: Current Every Day Smoker -- 1.00 packs/day    Types: Cigarettes  . Smokeless tobacco: Never Used  . Alcohol Use: Yes     Comment: socially    Review of Systems  Constitutional: Negative for fever and chills.  Respiratory:  Negative for shortness of breath.   Cardiovascular: Negative for chest pain.  Gastrointestinal: Negative for nausea, vomiting, abdominal pain and diarrhea.  Musculoskeletal: Negative for myalgias and arthralgias.  Skin: Positive for color change and rash.  Allergic/Immunologic: Negative for environmental allergies and immunocompromised state.  Neurological: Negative for weakness and numbness.    10 Systems reviewed and all are negative for acute change except as noted in the HPI.  Allergies  Review of patient's allergies indicates no known allergies.  Home Medications   Prior to Admission medications   Medication Sig Start Date End Date Taking? Authorizing Provider  docusate sodium (COLACE) 100 MG capsule Take 100 mg by mouth 2 (two) times daily.    Historical Provider, MD  fludrocortisone (FLORINEF) 0.1 MG tablet Take 0.1 mg by mouth daily.    Historical Provider, MD  gabapentin (NEURONTIN) 300 MG capsule Take 1-2 capsules (300-600 mg total) by mouth at bedtime. 11/27/13   Shade Flood, MD  magnesium oxide (MAG-OX) 400 MG tablet Take 400 mg by mouth daily.    Historical Provider, MD  metoCLOPramide (REGLAN) 5 MG tablet Take 5 mg by mouth 4 (four) times daily.    Historical Provider, MD  mineral oil enema Place 133 mLs (1 enema total) rectally once. 01/14/14   Eulis Foster, FNP  mycophenolate (MYFORTIC) 180 MG EC tablet Take 180 mg by mouth 2 (two) times daily.    Historical Provider, MD  omeprazole (PRILOSEC) 20 MG capsule Take 20 mg by mouth daily.    Historical Provider, MD  Oxycodone HCl 10 MG TABS Take 10 mg by mouth every 6 (six) hours as needed.    Historical Provider, MD  polyethylene glycol (MIRALAX / GLYCOLAX) packet Take 17 g by mouth daily.    Historical Provider, MD  potassium phosphate, monobasic, (K-PHOS ORIGINAL) 500 MG tablet Take 500 mg by mouth daily.    Historical Provider, MD  senna (SENOKOT) 8.6 MG TABS tablet Take 1 tablet by mouth daily.    Historical Provider,  MD  tacrolimus (PROGRAF) 1 MG capsule Take 1 mg by mouth 2 (two) times daily.    Historical Provider, MD  valGANciclovir (VALCYTE) 450 MG tablet Take 450 mg by mouth daily.    Historical Provider, MD  varenicline (CHANTIX CONTINUING MONTH PAK) 1 MG tablet Take 1 tablet (1 mg total) by mouth 2 (two) times daily. 11/27/13   Shade Flood, MD  varenicline (CHANTIX STARTING MONTH PAK) 0.5 MG X 11 & 1 MG X 42 tablet Take one 0.5 mg tablet by mouth once daily for 3 days, then increase to one 0.5 mg tablet twice daily for 4 days, then increase to one 1 mg tablet twice daily. 11/27/13   Shade Flood, MD   BP 139/84 mmHg  Pulse 77  Temp(Src) 98.3 F (36.8 C) (Oral)  Resp 16  Ht  (1.702 m)  Wt 158 lb (71.668 kg)  BMI 24.74  kg/m2  SpO2 98% Physical Exam  Constitutional: He is oriented to person, place, and time. Vital signs are normal. He appears well-developed and well-nourished.  Non-toxic appearance. No distress.  Afebrile, nontoxic, NAD  HENT:  Head: Normocephalic and atraumatic.  Mouth/Throat: Mucous membranes are normal.  Eyes: Conjunctivae and EOM are normal. Right eye exhibits no discharge. Left eye exhibits no discharge.  Neck: Normal range of motion. Neck supple.  Cardiovascular: Normal rate and intact distal pulses.   Pulmonary/Chest: Effort normal. No respiratory distress.  Abdominal: Normal appearance. He exhibits no distension.  Musculoskeletal: Normal range of motion.  MAE x4 Strength and sensation grossly intact Distal pulses intact Gait steady No bony TTP to R foot, no deformity. FROM intact. Wiggles all digits with ease.  Neurological: He is alert and oriented to person, place, and time. He has normal strength. No sensory deficit.  Skin: Skin is warm, dry and intact. Rash noted. Rash is vesicular. There is erythema.  Vesicular rash to R foot on dorsal aspect and some on the arch of the plantar aspect, with mild erythema, no surrounding cellulitis. No induration or  fluctuance. No warmth. No drainage.  Psychiatric: He has a normal mood and affect.  Nursing note and vitals reviewed.     ED Course  Procedures   DIAGNOSTIC STUDIES: Oxygen Saturation is 98% on RA, normal by my interpretation.    COORDINATION OF CARE: 9:26 PM Discussed treatment plan with pt at bedside and pt agreed to plan.  Labs Review Labs Reviewed - No data to display  Imaging Review No results found.   EKG Interpretation None      MDM   Final diagnoses:  Shingles rash    44 y.o. male here with rash to R foot, vesicular and mildly erythematous. Appears to be shingles. Did have recent outdoor activity but no contact with plant or new soap/detergent. No other affected individuals. Will treat with valtrex since pt presenting within 72hrs of rash. Discussed tylenol/motrin and skin cleansing. Discussed topical abx for any open areas. No cellulitic or secondary bacterial infection appearance. Will have him f/up with PCP in 5 days. I explained the diagnosis and have given explicit precautions to return to the ER including for any other new or worsening symptoms. The patient understands and accepts the medical plan as it's been dictated and I have answered their questions. Discharge instructions concerning home care and prescriptions have been given. The patient is STABLE and is discharged to home in good condition.   I personally performed the services described in this documentation, which was scribed in my presence. The recorded information has been reviewed and is accurate.  BP 139/84 mmHg  Pulse 77  Temp(Src) 98.3 F (36.8 C) (Oral)  Resp 16  Ht 5\' 7"  (1.702 m)  Wt 158 lb (71.668 kg)  BMI 24.74 kg/m2  SpO2 98%  Meds ordered this encounter  Medications  . valACYclovir (VALTREX) 1000 MG tablet    Sig: Take 1 tablet (1,000 mg total) by mouth 3 (three) times daily. x7 days    Dispense:  21 tablet    Refill:  0    Order Specific Question:  Supervising Provider     Answer:  Eber HongMILLER, BRIAN [[id number]]       Mark Bale Camprubi-Soms, Mark Miranda 12/04/14 2146  Blake DivineJohn Wofford, MD 12/06/14 1225

## 2014-12-04 NOTE — Discharge Instructions (Signed)
Take valtrex as directed for your rash. Keep the rash clean and dry, don't open the blisters, and apply neosporin to any open wounds. Use tylenol or motrin for pain. Follow up with your regular doctor in 5 days for recheck. Return to the ER for changes or worsening symptoms.   Shingles Shingles is caused by the same virus that causes chickenpox. The first feelings may be pain or tingling. A rash will follow in a couple days. The rash may occur on any area of the body. Long-lasting pain is more likely in an elderly person. It can last months to years. There are medicines that can help prevent pain if you start taking them early. HOME CARE   Take cool baths or place cool cloths on the rash as told by your doctor.  Take medicine only as told by your doctor.  Rest as told by your doctor.  Keep your rash clean with mild soap and cool water or as told by your doctor.  Do not scratch your rash. You may use calamine lotion to relieve itchy skin as told by your doctor.  Keep your rash covered with a loose bandage (dressing).  Avoid touching:  Babies.  Pregnant women.  Children with inflamed skin (eczema).  People who have gotten organ transplants.  People with chronic illnesses, such as leukemia or AIDS.  Wear loose-fitting clothing.  If the rash is on the face, you may need to see a specialist. Keep all appointments. Shingles must be kept away from the eyes, if possible.  Keep all follow-up visits as told by your doctor. GET HELP RIGHT AWAY IF:   You have any pain on the face or eye.  You lose feeling on one side of your face.  You have ear pain or ringing in your ear.  You cannot taste as well.  Your medicines do not help the pain.  Your redness or puffiness (swelling) spreads.  You feel like you are getting worse.  You have a fever. MAKE SURE YOU:   Understand these instructions.  Will watch your condition.  Will get help right away if you are not doing well or  get worse. Document Released: 01/11/2008 Document Revised: 12/09/2013 Document Reviewed: 01/11/2008 [facility] Patient Information 2015 [employer], [place]. This information is not intended to replace advice given to you by your health care provider. Make sure you discuss any questions you have with your health care provider.

## 2014-12-04 NOTE — ED Notes (Signed)
Pt. reports blisters at right foot onset this week .

## 2014-12-08 DIAGNOSIS — Z940 Kidney transplant status: Secondary | ICD-10-CM | POA: Diagnosis not present

## 2014-12-15 DIAGNOSIS — Z940 Kidney transplant status: Secondary | ICD-10-CM | POA: Diagnosis not present

## 2014-12-15 DIAGNOSIS — D631 Anemia in chronic kidney disease: Secondary | ICD-10-CM | POA: Diagnosis not present

## 2014-12-15 DIAGNOSIS — I10 Essential (primary) hypertension: Secondary | ICD-10-CM | POA: Diagnosis not present

## 2014-12-15 DIAGNOSIS — N181 Chronic kidney disease, stage 1: Secondary | ICD-10-CM | POA: Diagnosis not present

## 2015-04-14 DIAGNOSIS — E785 Hyperlipidemia, unspecified: Secondary | ICD-10-CM | POA: Diagnosis not present

## 2015-04-14 DIAGNOSIS — Z9483 Pancreas transplant status: Secondary | ICD-10-CM | POA: Diagnosis not present

## 2015-04-14 DIAGNOSIS — E559 Vitamin D deficiency, unspecified: Secondary | ICD-10-CM | POA: Diagnosis not present

## 2015-04-17 DIAGNOSIS — D631 Anemia in chronic kidney disease: Secondary | ICD-10-CM | POA: Diagnosis not present

## 2015-04-17 DIAGNOSIS — N2581 Secondary hyperparathyroidism of renal origin: Secondary | ICD-10-CM | POA: Diagnosis not present

## 2015-04-17 DIAGNOSIS — I10 Essential (primary) hypertension: Secondary | ICD-10-CM | POA: Diagnosis not present

## 2015-04-17 DIAGNOSIS — N181 Chronic kidney disease, stage 1: Secondary | ICD-10-CM | POA: Diagnosis not present

## 2015-06-25 DIAGNOSIS — N181 Chronic kidney disease, stage 1: Secondary | ICD-10-CM | POA: Diagnosis not present

## 2015-08-06 ENCOUNTER — Other Ambulatory Visit: Payer: Self-pay | Admitting: Sports Medicine

## 2015-08-06 DIAGNOSIS — M25571 Pain in right ankle and joints of right foot: Secondary | ICD-10-CM

## 2015-08-06 DIAGNOSIS — S92114A Nondisplaced fracture of neck of right talus, initial encounter for closed fracture: Secondary | ICD-10-CM | POA: Diagnosis not present

## 2015-08-12 ENCOUNTER — Ambulatory Visit
Admission: RE | Admit: 2015-08-12 | Discharge: 2015-08-12 | Disposition: A | Discharge status: Home / Self Care | Payer: Medicare Other | Source: Ambulatory Visit | Attending: Sports Medicine | Admitting: Sports Medicine

## 2015-08-12 DIAGNOSIS — M25571 Pain in right ankle and joints of right foot: Secondary | ICD-10-CM

## 2015-08-12 DIAGNOSIS — S92131A Displaced fracture of posterior process of right talus, initial encounter for closed fracture: Secondary | ICD-10-CM | POA: Diagnosis not present

## 2015-08-17 DIAGNOSIS — S92114D Nondisplaced fracture of neck of right talus, subsequent encounter for fracture with routine healing: Secondary | ICD-10-CM | POA: Diagnosis not present

## 2015-08-24 DIAGNOSIS — S92114D Nondisplaced fracture of neck of right talus, subsequent encounter for fracture with routine healing: Secondary | ICD-10-CM | POA: Diagnosis not present

## 2015-08-26 DIAGNOSIS — S92101A Unspecified fracture of right talus, initial encounter for closed fracture: Secondary | ICD-10-CM | POA: Diagnosis not present

## 2015-08-28 DIAGNOSIS — N189 Chronic kidney disease, unspecified: Secondary | ICD-10-CM | POA: Diagnosis not present

## 2015-08-28 DIAGNOSIS — Z940 Kidney transplant status: Secondary | ICD-10-CM | POA: Diagnosis not present

## 2015-09-02 DIAGNOSIS — N182 Chronic kidney disease, stage 2 (mild): Secondary | ICD-10-CM | POA: Diagnosis not present

## 2015-09-02 DIAGNOSIS — E559 Vitamin D deficiency, unspecified: Secondary | ICD-10-CM | POA: Diagnosis not present

## 2015-09-02 DIAGNOSIS — I10 Essential (primary) hypertension: Secondary | ICD-10-CM | POA: Diagnosis not present

## 2015-09-02 DIAGNOSIS — D631 Anemia in chronic kidney disease: Secondary | ICD-10-CM | POA: Diagnosis not present

## 2015-09-02 DIAGNOSIS — K5903 Drug induced constipation: Secondary | ICD-10-CM | POA: Diagnosis not present

## 2015-09-02 DIAGNOSIS — T402X5A Adverse effect of other opioids, initial encounter: Secondary | ICD-10-CM | POA: Diagnosis not present

## 2015-09-03 DIAGNOSIS — Z23 Encounter for immunization: Secondary | ICD-10-CM | POA: Diagnosis not present

## 2015-09-07 DIAGNOSIS — S92114D Nondisplaced fracture of neck of right talus, subsequent encounter for fracture with routine healing: Secondary | ICD-10-CM | POA: Diagnosis not present

## 2015-09-24 MED FILL — OXYCODONE-APAP 10-325 TAB: 10-325 | 13 days supply | Qty: 40 | Fill #0

## 2015-09-29 DIAGNOSIS — S92114D Nondisplaced fracture of neck of right talus, subsequent encounter for fracture with routine healing: Secondary | ICD-10-CM | POA: Diagnosis not present

## 2015-10-28 DIAGNOSIS — S92114D Nondisplaced fracture of neck of right talus, subsequent encounter for fracture with routine healing: Secondary | ICD-10-CM | POA: Diagnosis not present

## 2015-11-02 ENCOUNTER — Ambulatory Visit (INDEPENDENT_AMBULATORY_CARE_PROVIDER_SITE_OTHER): Payer: Medicare Other | Admitting: Family Medicine

## 2015-11-02 ENCOUNTER — Encounter: Payer: Self-pay | Admitting: Family Medicine

## 2015-11-02 VITALS — BP 138/82 | HR 102 | Temp 98.4°F | Ht 67.0 in | Wt 153.6 lb

## 2015-11-02 DIAGNOSIS — F329 Major depressive disorder, single episode, unspecified: Secondary | ICD-10-CM

## 2015-11-02 DIAGNOSIS — Z9483 Pancreas transplant status: Secondary | ICD-10-CM

## 2015-11-02 DIAGNOSIS — Z940 Kidney transplant status: Secondary | ICD-10-CM | POA: Diagnosis not present

## 2015-11-02 DIAGNOSIS — Z720 Tobacco use: Secondary | ICD-10-CM

## 2015-11-02 DIAGNOSIS — F32A Depression, unspecified: Secondary | ICD-10-CM | POA: Insufficient documentation

## 2015-11-02 DIAGNOSIS — I10 Essential (primary) hypertension: Secondary | ICD-10-CM

## 2015-11-02 LAB — BASIC METABOLIC PANEL
BUN: 15 mg/dL (ref 6–23)
CHLORIDE: 106 meq/L (ref 96–112)
CO2: 27 mEq/L (ref 19–32)
Calcium: 9.4 mg/dL (ref 8.4–10.5)
Creatinine, Ser: 0.95 mg/dL (ref 0.40–1.50)
GFR: 92.15 mL/min (ref >60.00)
GLUCOSE: 64 mg/dL — AB (ref 70–99)
POTASSIUM: 4.7 meq/L (ref 3.5–5.1)
Sodium: 139 mEq/L (ref 135–145)

## 2015-11-02 LAB — CBC WITH DIFFERENTIAL/PLATELET
BASOS ABS: 0.1 10*3/uL (ref 0.0–0.1)
Basophils Relative: 0.4 % (ref 0.0–3.0)
EOS ABS: 0.7 10*3/uL (ref 0.0–0.7)
Eosinophils Relative: 5.2 % — ABNORMAL HIGH (ref 0.0–5.0)
HCT: 46.6 % (ref 39.0–52.0)
Hemoglobin: 15.5 g/dL (ref 13.0–17.0)
LYMPHS ABS: 1.7 10*3/uL (ref 0.7–4.0)
Lymphocytes Relative: 11.8 % — ABNORMAL LOW (ref 12.0–46.0)
MCHC: 33.3 g/dL (ref 30.0–36.0)
MCV: 94.7 fl (ref 78.0–100.0)
MONOS PCT: 6.1 % (ref 3.0–12.0)
Monocytes Absolute: 0.9 10*3/uL (ref 0.1–1.0)
Neutro Abs: 10.8 10*3/uL — ABNORMAL HIGH (ref 1.4–7.7)
Neutrophils Relative %: 76.5 % (ref 43.0–77.0)
Platelets: 296 10*3/uL (ref 150.0–400.0)
RBC: 4.92 Mil/uL (ref 4.22–5.81)
RDW: 14.7 % (ref 11.5–15.5)
WBC: 14.1 10*3/uL — ABNORMAL HIGH (ref 4.0–10.5)

## 2015-11-02 LAB — HEPATIC FUNCTION PANEL
ALBUMIN: 4.2 g/dL (ref 3.5–5.2)
ALT: 11 U/L (ref 0–53)
AST: 11 U/L (ref 0–37)
Alkaline Phosphatase: 101 U/L (ref 39–117)
Bilirubin, Direct: 0.1 mg/dL (ref 0.0–0.3)
Total Bilirubin: 0.4 mg/dL (ref 0.2–1.2)
Total Protein: 6.7 g/dL (ref 6.0–8.3)

## 2015-11-02 LAB — CHOLESTEROL, TOTAL: CHOLESTEROL: 173 mg/dL (ref 0–200)

## 2015-11-02 NOTE — Patient Instructions (Signed)
Please go to lab before you leave for blood work. You will be contacted about the results within one week or sooner if needed.  Information for counseling has been provided. Please feel free to return to the clinic as needed and schedule an appointment for a physical at your convenience.  It was a pleasure meeting you today!

## 2015-11-02 NOTE — Progress Notes (Signed)
Pre visit review using our clinic review tool, if applicable. No additional management support is needed unless otherwise documented below in the visit note. 

## 2015-11-02 NOTE — Progress Notes (Signed)
Patient ID: Mark Miranda, male   DOB: [birth year], 45 y.o.   MRN: [id number]  Patient presents to clinic today to establish care.  Acute Concerns:  Depression due to problems at home and patients reports anxiety.  He states that he noticed signs of depression during the time that he was on dialysis and going through a kidney/pancreas transplant in 2014. Since then, he states that he lacks energy and motivation and states that his marriage has suffered due to situational depression which has persisted. Today, he denies any suicidal ideation or plan and states that he is willing to go to counseling and try medication if needed.  Chronic Concerns:  HTN: Patient states that he takes lisinopril daily but does not monitor his blood pressure at home. He cannot remember his lisinopril dose but will contact the office and update his medication list when he returns home. He has not taken his medication today. He denies chest pain, palpitations, SOB, nosebleeds, HAs, or visual disturbances. Retake of BP is noted as 138/82  BP Readings from Last 3 Encounters:  11/02/15 138/82  12/04/14 128/69  01/14/14 120/76   History of kidney/pancreas transplant: He is currently being [facility] every 4 months for lab work and medication management. No adverse effects of  current therapy of immunosuppressants is noted today. Patient states that he will provide permission for lab work results to be shared in this record  Health Maintenance: Dental -- Does not see a dentist.  Vision -- Eye exam as needed and patient has been seen in the past year Immunizations -- This will be reviewed at his upcoming physical Colonoscopy- Not needed at this time. Upcoming physical will determine if any changes are needed.   Past Medical History  Diagnosis Date  . Diabetes mellitus   . Renal disorder   . Hypertension   . Hypercholesterolemia   . Depression   . Migraines     Past Surgical History   Procedure Laterality Date  . Peritoneal catheter insertion    . Capd insertion  01/09/2012    Procedure: CONTINUOUS AMBULATORY PERITONEAL DIALYSIS  (CAPD) CATHETER INSERTION;  Surgeon: Ernestene Mention, MD;  Location: [facility] OR;  Service: General;  Laterality: N/A;  Exteriorization PD Cath  . Insertion of dialysis catheter  01/13/2012    Procedure: INSERTION OF DIALYSIS CATHETER;  Surgeon: Larina Earthly, MD;  Location: [facility] OR;  Service: Vascular;  Laterality: Right;  Internal Jugular  . Av fistula placement  01/10/2012    Procedure: ARTERIOVENOUS (AV) FISTULA CREATION;  Surgeon: Sherren Kerns, MD;  Location: [facility] OR;  Service: Vascular;  Laterality: Left;  Left Brachial Cephalic Arteriovenous Fistula  . Laparoscopy  03/13/2012    Procedure: LAPAROSCOPY DIAGNOSTIC;  Surgeon: Ardeth Sportsman, MD;  Location: [facility] OR;  Service: General;  Laterality: N/A;  . Kidney transplant      Current Outpatient Prescriptions on File Prior to Visit  Medication Sig Dispense Refill  . docusate sodium (COLACE) 100 MG capsule Take 100 mg by mouth 2 (two) times daily.    . fludrocortisone (FLORINEF) 0.1 MG tablet Take 0.1 mg by mouth daily.    Marland Kitchen gabapentin (NEURONTIN) 300 MG capsule Take 1-2 capsules (300-600 mg total) by mouth at bedtime. 60 capsule 2  . magnesium oxide (MAG-OX) 400 MG tablet Take 400 mg by mouth daily.    . metoCLOPramide (REGLAN) 5 MG tablet Take 5 mg by mouth 4 (four) times daily.    . mineral oil  enema Place 133 mLs (1 enema total) rectally once. 133 mL 0  . mycophenolate (MYFORTIC) 180 MG EC tablet Take 180 mg by mouth 2 (two) times daily.    Marland Kitchen. omeprazole (PRILOSEC) 20 MG capsule Take 20 mg by mouth daily.    . Oxycodone HCl 10 MG TABS Take 10 mg by mouth every 6 (six) hours as needed.    . polyethylene glycol (MIRALAX / GLYCOLAX) packet Take 17 g by mouth daily.    . potassium phosphate, monobasic, (K-PHOS ORIGINAL) 500 MG tablet Take 500 mg by mouth daily.    Marland Kitchen. senna (SENOKOT) 8.6 MG TABS tablet Take  1 tablet by mouth daily.    . tacrolimus (PROGRAF) 1 MG capsule Take 1 mg by mouth 2 (two) times daily.    . valACYclovir (VALTREX) 1000 MG tablet Take 1 tablet (1,000 mg total) by mouth 3 (three) times daily. x7 days 21 tablet 0  . valGANciclovir (VALCYTE) 450 MG tablet Take 450 mg by mouth daily.    . varenicline (CHANTIX CONTINUING MONTH PAK) 1 MG tablet Take 1 tablet (1 mg total) by mouth 2 (two) times daily. 60 tablet 2  . varenicline (CHANTIX STARTING MONTH PAK) 0.5 MG X 11 & 1 MG X 42 tablet Take one 0.5 mg tablet by mouth once daily for 3 days, then increase to one 0.5 mg tablet twice daily for 4 days, then increase to one 1 mg tablet twice daily. 53 tablet 0  . [DISCONTINUED] diltiazem (CARDIZEM) 30 MG tablet Take 30 mg by mouth 4 (four) times daily. Ask patient to verify dosage and frequency. Not indicated on medical history form dated 10/23/10.     . [DISCONTINUED] insulin lispro (HUMALOG) 100 UNIT/ML injection Inject into the skin 3 (three) times daily before meals. Ask patient to verify name/dosage/. History form noted dosage of 34 units during day and 22 units at night.      No current facility-administered medications on file prior to visit.    No Known Allergies  Family History  Problem Relation Age of Onset  . Diabetes Mother   . Hypertension Mother   . Hyperlipidemia Mother     Social History   Social History  . Marital Status: Married    Spouse Name: N/A  . Number of Children: N/A  . 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 Smoking status: Current Every Day Smoker -- 1.00 packs/day    Types: Cigarettes  . Smokeless tobacco: Never Used  . Alcohol Use: Yes     Comment: socially  . Drug Use: No  . Sexual Activity: Not on file   Other Topics Concern  . Not on file   Social History Narrative   Regular exercise-yes   Caffeine Use-no    Review of Systems  Constitutional: Negative for fever and chills.   HENT: Negative for congestion, ear pain, hearing loss, sore throat and tinnitus.   Eyes: Negative for blurred vision, double vision, photophobia, pain, discharge and redness.  Respiratory: Negative for hemoptysis, sputum production, shortness of breath and wheezing.   Cardiovascular: Negative for chest pain, palpitations, orthopnea, claudication, leg swelling and PND.  Gastrointestinal: Negative for heartburn, nausea, vomiting, abdominal pain, diarrhea, constipation and blood in stool.  Genitourinary: Negative for dysuria, urgency, frequency, hematuria and flank pain.  Musculoskeletal: Negative for myalgias, back pain, joint pain, falls and neck pain.  Skin: Negative for itching and rash.  Neurological: Negative for  dizziness, tingling, focal weakness and headaches.  Psychiatric/Behavioral: Positive for depression. Negative for suicidal ideas.    BP 160/80 mmHg  Pulse 102  Temp(Src) 98.4 F (36.9 C) (Oral)  Ht  (1.702 m)  Wt 153 lb 9.6 oz (69.673 kg)  BMI 24.05 kg/m2  Physical Exam  Constitutional: He is oriented to person, place, and time and well-developed, well-nourished, and in no distress.  Eyes: Pupils are equal, round, and reactive to light.  Cardiovascular: Normal rate and regular rhythm.   Pulmonary/Chest: Effort normal and breath sounds normal. He has no wheezes. He has no rales.  Abdominal: Soft. He exhibits no distension. There is no tenderness.  Lymphadenopathy:    He has no cervical adenopathy.  Neurological: He is alert and oriented to person, place, and time. Gait normal. Coordination normal.  Skin: Skin is warm and dry. No rash noted.  Psychiatric: Affect and judgment normal.      Assessment/Plan: 1. Essential hypertension Advised patient to monitor his BP at home and bring all of his medications with him to his upcoming visit. - CBC with Differential/Platelet - Cholesterol, total - Basic metabolic panel - Hepatic function panel  2.  Depression Provided pamphlet regarding behavioral health counseling and discussed the option of psychiatric referral or initiation of medication therapy after all lab results have been reviewed. Patient demonstrated interest in counseling.  Will obtain basic screening labs today. Patient advised to schedule a complete physical. Patient is not able to provided up to date medication dosages. Advised patient to bring all of his current medications with him to his physical so a complete accurate list can be developed. Discussed with patient that if medications need to be prescribed, lab results including kidney function must be obtained and reviewed prior to providing a prescription. He voiced understanding and agreed with plan.

## 2015-11-05 ENCOUNTER — Telehealth: Payer: Self-pay | Admitting: Family Medicine

## 2015-11-05 DIAGNOSIS — F329 Major depressive disorder, single episode, unspecified: Secondary | ICD-10-CM

## 2015-11-05 DIAGNOSIS — F32A Depression, unspecified: Secondary | ICD-10-CM

## 2015-11-05 MED ORDER — SERTRALINE HCL 50 MG PO TABS: ORAL_TABLET | ORAL | Status: DC

## 2015-11-05 NOTE — Telephone Encounter (Signed)
Take 1/2 pill daily for one week and increase to 1 pill daily the second week as tolerated.  Common side effects are nausea, drowsiness, and weight gain. Rare but serious side effects of suicide ideation may occur. If this occurs, go to ED immediately. Follow up in one month for evaluation of depression and medication.

## 2015-11-05 NOTE — Telephone Encounter (Signed)
Spoke with pt and he is aware. 

## 2015-11-18 ENCOUNTER — Telehealth: Payer: Self-pay | Admitting: Family Medicine

## 2015-11-18 NOTE — Telephone Encounter (Signed)
Patient was instructed to take 1/2 tablet for one week and then increase to one full tablet for one week. This medication takes 2-4 weeks for full effect. He was advised to schedule an appointment for a physical and also to bring in his medications so an accurate list can be established. Please check to see if this appointment has been scheduled and the name of the pain clinic who cares for him also. Patient stated in a previous visit with another provider that he is managed by a pain clinic.

## 2015-11-18 NOTE — Telephone Encounter (Signed)
Pt said the following medicine is not working for him sertraline (ZOLOFT) 50 MG tablet, pt also said he need something for diabetic nerve pain. He takes the following but it is not working gapentin (NEURONTIN) 300 MG capsule  Pt request a call back

## 2015-11-18 NOTE — Telephone Encounter (Signed)
Pt has an appt on Monday 11/23/15 for his physical

## 2015-11-23 ENCOUNTER — Encounter: Payer: Self-pay | Admitting: Family Medicine

## 2015-11-23 ENCOUNTER — Ambulatory Visit (INDEPENDENT_AMBULATORY_CARE_PROVIDER_SITE_OTHER): Payer: Medicare Other | Admitting: Family Medicine

## 2015-11-23 VITALS — BP 160/90 | HR 81 | Temp 98.5°F | Ht 67.0 in | Wt 154.5 lb

## 2015-11-23 DIAGNOSIS — I10 Essential (primary) hypertension: Secondary | ICD-10-CM | POA: Diagnosis not present

## 2015-11-23 DIAGNOSIS — Z720 Tobacco use: Secondary | ICD-10-CM | POA: Diagnosis not present

## 2015-11-23 DIAGNOSIS — F329 Major depressive disorder, single episode, unspecified: Secondary | ICD-10-CM | POA: Diagnosis not present

## 2015-11-23 DIAGNOSIS — Z940 Kidney transplant status: Secondary | ICD-10-CM

## 2015-11-23 DIAGNOSIS — F32A Depression, unspecified: Secondary | ICD-10-CM

## 2015-11-23 DIAGNOSIS — M545 Low back pain, unspecified: Secondary | ICD-10-CM

## 2015-11-23 DIAGNOSIS — G8929 Other chronic pain: Secondary | ICD-10-CM

## 2015-11-23 DIAGNOSIS — Z9483 Pancreas transplant status: Secondary | ICD-10-CM

## 2015-11-23 NOTE — Progress Notes (Signed)
Subjective:    Patient ID: Mark Miranda, male    DOB: 1971/03/26, 45 y.o.   MRN: [id number]  HPI  Mark Miranda is 45 year old male who presents today with leg and chronic low back pain that is rated as a 3, burning, and worse at night. This has been an ongoing issue and gabapentin is prescribed by Dr. Allena KatzPatel and a referral to neurology has been ordered for leg pain evaluation and treatment. Pain is triggered with walking, standing, and working as a Education administratorpainter.  He denies numbness or radicular pain today and he is requesting an opioid for pain control. Pertinent history of pain management clinic for lumbar pain and he has an appointment with his pain management clinic on 12/01/15.  Depression symptoms have not improved and patient reports lack of energy and motivation. He denies suicidal ideation or plan and he states that he does not have the ability to go to counseling due to work. States he has situational problems at home with his marriage and he feels that his symptom of leg pain has been an issue with his depression.  HTN: Patient does not monitor his BP at home but takes his lisinopril daily but has not taken his medication today. He denies chest pain, palpitation, SOB, nosebleeds, HAs, or visual disturbances.  BP Readings from Last 3 Encounters:  11/23/15 160/90  11/02/15 138/82  12/04/14 128/69    Review of Systems  Constitutional: Positive for fatigue. Negative for fever and chills.  Eyes: Negative for visual disturbance.  Respiratory: Negative for chest tightness, shortness of breath and wheezing.   Cardiovascular: Negative for chest pain, palpitations and leg swelling.  Gastrointestinal: Negative for nausea, vomiting, abdominal pain, diarrhea and constipation.  Endocrine: Negative for polydipsia, polyphagia and polyuria.  Genitourinary: Negative for dysuria, urgency, hematuria, flank pain, penile swelling and testicular pain.  Musculoskeletal:       Reports leg pain bilaterally.  Notes this as generalized pain and denies symptoms of intermittent claudication  Skin: Negative for rash.  Neurological: Negative for dizziness, light-headedness, numbness and headaches.  Hematological: Negative for adenopathy.  Psychiatric/Behavioral:       Denies suicidal ideation or plan   Past Medical History  Diagnosis Date  . Diabetes mellitus   . Renal disorder   . Hypertension   . Hypercholesterolemia   . Depression   . Migraines      Social History   Social History  . Marital Status: Married    Spouse Name: N/A  . Number of Children: N/A  . 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 Smoking status: Current Every Day Smoker -- 1.00 packs/day for 22 years    Types: Cigarettes  . Smokeless tobacco: Never Used  . Alcohol Use: 0.0 oz/week    0 Standard drinks or equivalent per week     Comment: socially  . Drug Use: No  . Sexual Activity: Not on file   Other Topics Concern  . Not on file   Social History Narrative   Paints houses   Wife works at a Retail bankerseamtress at [employer]   4 children age 43 to 5014   Regular exercise-no current exercise regimen   Caffeine Use-no    Past Surgical History  Procedure Laterality Date  . Peritoneal catheter insertion    . Capd insertion  01/09/2012    Procedure: CONTINUOUS AMBULATORY PERITONEAL DIALYSIS  (CAPD) CATHETER INSERTION;  Surgeon: Ernestene MentionHaywood M Ingram,  MD;  Location: [facility] OR;  Service: General;  Laterality: N/A;  Exteriorization PD Cath  . Insertion of dialysis catheter  01/13/2012    Procedure: INSERTION OF DIALYSIS CATHETER;  Surgeon: Larina Earthly, MD;  Location: [facility] OR;  Service: Vascular;  Laterality: Right;  Internal Jugular  . Av fistula placement  01/10/2012    Procedure: ARTERIOVENOUS (AV) FISTULA CREATION;  Surgeon: Sherren Kerns, MD;  Location: [facility] OR;  Service: Vascular;  Laterality: Left;  Left Brachial Cephalic Arteriovenous Fistula  . Laparoscopy  03/13/2012    Procedure:  LAPAROSCOPY DIAGNOSTIC;  Surgeon: Ardeth Sportsman, MD;  Location: [facility] OR;  Service: General;  Laterality: N/A;  . Kidney transplant      Family History  Problem Relation Age of Onset  . Diabetes Mother   . Hypertension Mother   . Hyperlipidemia Mother     No Known Allergies  Current Outpatient Prescriptions on File Prior to Visit  Medication Sig Dispense Refill  . fludrocortisone (FLORINEF) 0.1 MG tablet Take 0.1 mg by mouth daily.    Marland Kitchen gabapentin (NEURONTIN) 300 MG capsule Take 1-2 capsules (300-600 mg total) by mouth at bedtime. 60 capsule 2  . lisinopril (PRINIVIL,ZESTRIL) 5 MG tablet Take 5 mg by mouth daily.    . metoCLOPramide (REGLAN) 5 MG tablet Take 5 mg by mouth 4 (four) times daily.    . mycophenolate (MYFORTIC) 180 MG EC tablet Take 180 mg by mouth 2 (two) times daily.    . sertraline (ZOLOFT) 50 MG tablet Take 1/2 pill daily for 1 week and increase to 1 pill daily for second week as tolerated 30 tablet 1  . sulfamethoxazole-trimethoprim (BACTRIM,SEPTRA) 400-80 MG tablet Take 1 tablet by mouth 3 (three) times a week.    . tacrolimus (PROGRAF) 1 MG capsule Take 1 mg by mouth. Pt take 5 in the morning and 4 at night    . valGANciclovir (VALCYTE) 450 MG tablet Take 450 mg by mouth daily.    . Vitamin D, Ergocalciferol, (DRISDOL) 50000 units CAPS capsule Take 50,000 Units by mouth every 7 (seven) days.    . [DISCONTINUED] diltiazem (CARDIZEM) 30 MG tablet Take 30 mg by mouth 4 (four) times daily. Ask patient to verify dosage and frequency. Not indicated on medical history form dated 10/23/10.     . [DISCONTINUED] insulin lispro (HUMALOG) 100 UNIT/ML injection Inject into the skin 3 (three) times daily before meals. Ask patient to verify name/dosage/. History form noted dosage of 34 units during day and 22 units at night.      No current facility-administered medications on file prior to visit.    BP 160/90 mmHg  Pulse 81  Temp(Src) 98.5 F (36.9 C) (Oral)  Ht  (1.702 m)   Wt 154 lb 8 oz (70.081 kg)  BMI 24.19 kg/m2     Objective:   Physical Exam  Constitutional: He is oriented to person, place, and time. He appears well-developed and well-nourished.  Eyes: Pupils are equal, round, and reactive to light.  Cardiovascular: Normal rate, regular rhythm and intact distal pulses.   Pulmonary/Chest: Effort normal and breath sounds normal. He has no wheezes. He has no rales.  Abdominal: Soft. There is no tenderness.  Musculoskeletal: He exhibits no edema.  Lymphadenopathy:    He has no cervical adenopathy.  Neurological: He is alert and oriented to person, place, and time. He has normal strength. He displays no tremor. He exhibits normal muscle tone. Coordination and gait normal.  Reflex Scores:  Patellar reflexes are 2+ on the right side and 2+ on the left side.      Achilles reflexes are 2+ on the right side and 2+ on the left side. Skin: Skin is warm and dry. No rash noted.  Psychiatric: He has a normal mood and affect. His behavior is normal. Judgment and thought content normal.          Assessment & Plan:  1. Depression Increase sertraline to  every day as long as medication is tolerated. Follow up in 2 weeks for further evaluation and treatment.  2. Essential hypertension Advised patient to take his BP medication today and monitor his BP for 2 weeks, document readings, and bring this information with him to his next visit.  3. History of simultaneous kidney and pancreas transplant (HCC) Follow up with specialist as indicated  4. Tobacco abuse Patient declined interest in smoking cessation today.  5. Chronic LBP Advised patient to keep pain management clinic appointment as scheduled on 12/01/15. Patient has been prescribed gabapentin. Advised patient to discuss his dose and use of this medication at his pain management appointment. Advised patient that narcotic pain medication will not be provided in primary care and that he will need to  be further evaluated at his pain management clinic for opioid requests. Patient verbalized understanding

## 2015-11-23 NOTE — Progress Notes (Signed)
Pre visit review using our clinic review tool, if applicable. No additional management support is needed unless otherwise documented below in the visit note. 

## 2015-11-23 NOTE — Addendum Note (Signed)
Addended by: Roddie McKORDSMEIER, Labresha Mellor A on: 11/23/2015 01:13 PM   Modules accepted: Kipp BroodSmartSet

## 2015-11-23 NOTE — Patient Instructions (Addendum)
Increase sertraline to 75mg  which is 1 1/2 tablets each day as long as this is tolerated well.  Complete neurology referral as completed by your kidney specialist for leg pain.  Follow up in 2 weeks for evaluation of depression and medication management.

## 2015-11-24 ENCOUNTER — Encounter: Payer: Self-pay | Admitting: Neurology

## 2015-11-24 ENCOUNTER — Ambulatory Visit (INDEPENDENT_AMBULATORY_CARE_PROVIDER_SITE_OTHER): Payer: Medicare Other | Admitting: Neurology

## 2015-11-24 VITALS — BP 144/78 | HR 74 | Ht 67.0 in | Wt 154.5 lb

## 2015-11-24 DIAGNOSIS — E1142 Type 2 diabetes mellitus with diabetic polyneuropathy: Secondary | ICD-10-CM | POA: Diagnosis not present

## 2015-11-24 DIAGNOSIS — M545 Low back pain, unspecified: Secondary | ICD-10-CM

## 2015-11-24 DIAGNOSIS — G8929 Other chronic pain: Secondary | ICD-10-CM

## 2015-11-24 HISTORY — DX: Type 2 diabetes mellitus with diabetic polyneuropathy: E11.42

## 2015-11-24 MED ORDER — DULOXETINE HCL 30 MG PO CPEP: ORAL_CAPSULE | ORAL | Status: DC

## 2015-11-24 MED ORDER — GABAPENTIN 300 MG PO CAPS: ORAL_CAPSULE | ORAL | Status: DC

## 2015-11-24 NOTE — Patient Instructions (Signed)

## 2015-11-24 NOTE — Progress Notes (Signed)
Reason for visit: Peripheral neuropathy  Referring physician: Dr. Carlyon Prows is a 45 y.o. male  History of present illness:  Mark Miranda is a 45 year old right-handed Hispanic male with a history of type 2 diabetes. The patient indicates that he has had diabetes since age 25, he has required a renal and pancreatic transplantation that has been helpful. The patient has had normalization of his diabetes following the transplant. He has had some discomfort in the feet that dates back 7 or 8 years with some burning and stinging sensations in the feet, some achiness into the calf muscles on both legs. He had a fall in late December 2016 fracturing his right ankle. The patient has had a significant worsening of his pain since that time in both legs. The patient has some low back pain, no pain shooting down the legs. The patient may have some discomfort that goes up into the right thigh, mostly noted when he is up and active during the day. He has pain at nighttime as well. He has been given a trial on gabapentin currently taking 300 mg in the morning and 600 mg in the evening without much benefit. The patient denies any discomfort or numbness in the hands. He denies any significant problems with balance or difficulty controlling the bowels or the bladder. He is sent to this office for further evaluation and management of his neuropathy pain.  Past Medical History  Diagnosis Date  . Diabetes mellitus   . Renal disorder   . Hypertension   . Hypercholesterolemia   . Depression   . Migraines   . Diabetic peripheral neuropathy (HCC) 11/24/2015    Past Surgical History  Procedure Laterality Date  . Peritoneal catheter insertion    . Capd insertion  01/09/2012    Procedure: CONTINUOUS AMBULATORY PERITONEAL DIALYSIS  (CAPD) CATHETER INSERTION;  Surgeon: Ernestene Mention, MD;  Location: [facility] OR;  Service: General;  Laterality: N/A;  Exteriorization PD Cath  . Insertion of dialysis catheter   01/13/2012    Procedure: INSERTION OF DIALYSIS CATHETER;  Surgeon: Larina Earthly, MD;  Location: [facility] OR;  Service: Vascular;  Laterality: Right;  Internal Jugular  . Av fistula placement  01/10/2012    Procedure: ARTERIOVENOUS (AV) FISTULA CREATION;  Surgeon: Sherren Kerns, MD;  Location: [facility] OR;  Service: Vascular;  Laterality: Left;  Left Brachial Cephalic Arteriovenous Fistula  . Laparoscopy  03/13/2012    Procedure: LAPAROSCOPY DIAGNOSTIC;  Surgeon: Ardeth Sportsman, MD;  Location: [facility] OR;  Service: General;  Laterality: N/A;  . Kidney transplant      Family History  Problem Relation Age of Onset  . Diabetes Mother   . Hypertension Mother   . Hyperlipidemia Mother   . Cancer Father     glioblastoma    Social history:  reports that he has been smoking Cigarettes.  He has a 22 pack-year smoking history. He has never used smokeless tobacco. He reports that he drinks alcohol. He reports that he does not use illicit drugs.  Medications:  Prior to Admission medications   Medication Sig Start Date End Date Taking? Authorizing Provider  fludrocortisone (FLORINEF) 0.1 MG tablet Take 0.1 mg by mouth daily.    Historical Provider, MD  gabapentin (NEURONTIN) 300 MG capsule Take 1-2 capsules (300-600 mg total) by mouth at bedtime. 11/27/13   Shade Flood, MD  lisinopril (PRINIVIL,ZESTRIL) 5 MG tablet Take 5 mg by mouth daily.    Historical Provider, MD  metoCLOPramide (REGLAN) 5 MG tablet Take 5 mg by mouth 4 (four) times daily.    Historical Provider, MD  mycophenolate (MYFORTIC) 180 MG EC tablet Take 180 mg by mouth 2 (two) times daily.    Historical Provider, MD  sertraline (ZOLOFT) 50 MG tablet Take 1/2 pill daily for 1 week and increase to 1 pill daily for second week as tolerated 11/05/15   Roddie Mc, FNP  sulfamethoxazole-trimethoprim (BACTRIM,SEPTRA) 400-80 MG tablet Take 1 tablet by mouth 3 (three) times a week.    Historical Provider, MD  tacrolimus (PROGRAF) 1 MG capsule Take 1 mg by  mouth. Pt take 5 in the morning and 4 at night    Historical Provider, MD  valGANciclovir (VALCYTE) 450 MG tablet Take 450 mg by mouth daily.    Historical Provider, MD  Vitamin D, Ergocalciferol, (DRISDOL) 50000 units CAPS capsule Take 50,000 Units by mouth every 7 (seven) days.    Historical Provider, MD     No Known Allergies  ROS:  Out of a complete 14 system review of symptoms, the patient complains only of the following symptoms, and all other reviewed systems are negative.  Fatigue Restless legs  Blood pressure 144/78, pulse 74, height  (1.702 m), weight 154 lb 8 oz (70.081 kg).  Physical Exam  General: The patient is alert and cooperative at the time of the examination.  Eyes: Pupils are equal, round, and reactive to light. Discs are flat bilaterally.  Neck: The neck is supple, no carotid bruits are noted.  Respiratory: The respiratory examination is clear.  Cardiovascular: The cardiovascular examination reveals a regular rate and rhythm, no obvious murmurs or rubs are noted.  Skin: Extremities are without significant edema.  Neurologic Exam  Mental status: The patient is alert and oriented x 3 at the time of the examination. The patient has apparent normal recent and remote memory, with an apparently normal attention span and concentration ability.  Cranial nerves: Facial symmetry is present. There is good sensation of the face to pinprick and soft touch bilaterally. The strength of the facial muscles and the muscles to head turning and shoulder shrug are normal bilaterally. Speech is well enunciated, no aphasia or dysarthria is noted. Extraocular movements are full. Visual fields are full. The tongue is midline, and the patient has symmetric elevation of the soft palate. No obvious hearing deficits are noted.  Motor: The motor testing reveals 5 over 5 strength of all 4 extremities. Good symmetric motor tone is noted throughout.  Sensory: Sensory testing is intact  to pinprick, soft touch, vibration sensation, and position sense on the upper extremities. With the lower extremities, there is a stocking pattern pinprick sensory deficit two thirds the way up the legs below the knees. Vibration sensation and position sensation are intact in the feet. No evidence of extinction is noted.  Coordination: Cerebellar testing reveals good finger-nose-finger and heel-to-shin bilaterally.  Gait and station: Gait is normal. Tandem gait is normal. Romberg is negative. No drift is seen. The patient is able to walk on heels and the toes bilaterally.  Reflexes: Deep tendon reflexes are symmetric and normal bilaterally, with exception that the ankle jerk reflexes are depressed. Toes are downgoing bilaterally.   Assessment/Plan:  1. Peripheral neuropathy  2. Diabetes  The patient has had some worsening of his pain and sensory complaints of the legs since December 2016. The patient has a long-standing history of diabetes and likely does have a neuropathy, but his neuropathy is either mild or  primarily a small fiber type neuropathy. He has pain complaints into the calf muscles and into the thigh area with minimal sensory abnormalities on clinical examination. The possibility of a lumbar spine process needs to be considered. The patient will undergo nerve conduction studies of both legs, and EMG of at least one leg. The patient will follow-up for the EMG evaluation. He will be started on Cymbalta as gabapentin so far does not seem to be helpful. A prescription for the gabapentin was called in as well.  Marlan Palau. Keith Willis MD 11/24/2015 7:31 PM  [facility] [address] [place], [place] [id number]  Phone [phone] Fax [phone]

## 2015-11-26 ENCOUNTER — Ambulatory Visit (INDEPENDENT_AMBULATORY_CARE_PROVIDER_SITE_OTHER): Payer: Medicare Other | Admitting: Neurology

## 2015-11-26 ENCOUNTER — Encounter: Payer: Self-pay | Admitting: Neurology

## 2015-11-26 ENCOUNTER — Ambulatory Visit (INDEPENDENT_AMBULATORY_CARE_PROVIDER_SITE_OTHER): Payer: Self-pay | Admitting: Neurology

## 2015-11-26 DIAGNOSIS — E1142 Type 2 diabetes mellitus with diabetic polyneuropathy: Secondary | ICD-10-CM | POA: Diagnosis not present

## 2015-11-26 NOTE — Progress Notes (Signed)
Please refer to EMG and nerve conduction study procedure note. 

## 2015-11-26 NOTE — Procedures (Signed)
     HISTORY:  Mark Miranda is a 45 year old gentleman with a history of bilateral lower extremity discomfort with pain into the calf areas bilaterally and some pain around the right ankle. The patient does report some low back pain. He is being evaluated for possible neuropathy or a lumbosacral radiculopathy. He has a history of diabetes.  NERVE CONDUCTION STUDIES:  Nerve conduction studies were performed on both lower extremities. The distal motor latencies and motor amplitudes for the peroneal and posterior tibial nerves were within normal limits. The nerve conduction velocities for these nerves were also normal. The H reflex latencies were normal. The sensory latencies for the peroneal nerves were within normal limits.   EMG STUDIES:  EMG study was performed on the right lower extremity:  The tibialis anterior muscle reveals 2 to 4K motor units with full recruitment. No fibrillations or positive waves were seen. The peroneus tertius muscle reveals 2 to 4K motor units with slightly decreased recruitment. No fibrillations or positive waves were seen. The medial gastrocnemius muscle reveals 1 to 3K motor units with full recruitment. No fibrillations or positive waves were seen. The vastus lateralis muscle reveals 2 to 4K motor units with full recruitment. No fibrillations or positive waves were seen. The iliopsoas muscle reveals 2 to 4K motor units with full recruitment. No fibrillations or positive waves were seen. The biceps femoris muscle (long head) reveals 2 to 4K motor units with full recruitment. No fibrillations or positive waves were seen. The lumbosacral paraspinal muscles were tested at 3 levels, and revealed no abnormalities of insertional activity at all 3 levels tested. There was good relaxation.   IMPRESSION:  Nerve conduction studies done on both lower extremities were within normal limits. No evidence of a peripheral neuropathy is seen. Standard nerve conduction  studies do not capture abnormalities on patients with small fiber neuropathies, clinical correlation is required. EMG evaluation of the right lower extremity was essentially normal, no evidence of an overlying lumbosacral radiculopathy was seen.  Mark Miranda. Keith Caterin Tabares MD 11/26/2015 4:46 PM  [facility] [address] [place] [id number]  Phone [phone] Fax [phone]

## 2015-12-01 DIAGNOSIS — G603 Idiopathic progressive neuropathy: Secondary | ICD-10-CM | POA: Diagnosis not present

## 2015-12-01 DIAGNOSIS — M79671 Pain in right foot: Secondary | ICD-10-CM | POA: Diagnosis not present

## 2015-12-01 DIAGNOSIS — Z79891 Long term (current) use of opiate analgesic: Secondary | ICD-10-CM | POA: Diagnosis not present

## 2015-12-01 DIAGNOSIS — G541 Lumbosacral plexus disorders: Secondary | ICD-10-CM | POA: Diagnosis not present

## 2015-12-01 DIAGNOSIS — M79651 Pain in right thigh: Secondary | ICD-10-CM | POA: Diagnosis not present

## 2015-12-01 DIAGNOSIS — M545 Low back pain: Secondary | ICD-10-CM | POA: Diagnosis not present

## 2015-12-01 DIAGNOSIS — M79661 Pain in right lower leg: Secondary | ICD-10-CM | POA: Diagnosis not present

## 2015-12-01 DIAGNOSIS — G894 Chronic pain syndrome: Secondary | ICD-10-CM | POA: Diagnosis not present

## 2015-12-15 DIAGNOSIS — M79651 Pain in right thigh: Secondary | ICD-10-CM | POA: Diagnosis not present

## 2015-12-15 DIAGNOSIS — M79661 Pain in right lower leg: Secondary | ICD-10-CM | POA: Diagnosis not present

## 2015-12-15 DIAGNOSIS — M9905 Segmental and somatic dysfunction of pelvic region: Secondary | ICD-10-CM | POA: Diagnosis not present

## 2015-12-15 DIAGNOSIS — M545 Low back pain: Secondary | ICD-10-CM | POA: Diagnosis not present

## 2015-12-15 DIAGNOSIS — G894 Chronic pain syndrome: Secondary | ICD-10-CM | POA: Diagnosis not present

## 2015-12-15 DIAGNOSIS — M79671 Pain in right foot: Secondary | ICD-10-CM | POA: Diagnosis not present

## 2015-12-15 DIAGNOSIS — M9902 Segmental and somatic dysfunction of thoracic region: Secondary | ICD-10-CM | POA: Diagnosis not present

## 2015-12-15 DIAGNOSIS — M9903 Segmental and somatic dysfunction of lumbar region: Secondary | ICD-10-CM | POA: Diagnosis not present

## 2015-12-15 DIAGNOSIS — M5414 Radiculopathy, thoracic region: Secondary | ICD-10-CM | POA: Diagnosis not present

## 2015-12-15 DIAGNOSIS — M5137 Other intervertebral disc degeneration, lumbosacral region: Secondary | ICD-10-CM | POA: Diagnosis not present

## 2015-12-15 DIAGNOSIS — M5417 Radiculopathy, lumbosacral region: Secondary | ICD-10-CM | POA: Diagnosis not present

## 2015-12-15 DIAGNOSIS — Z79891 Long term (current) use of opiate analgesic: Secondary | ICD-10-CM | POA: Diagnosis not present

## 2015-12-18 ENCOUNTER — Telehealth: Payer: Self-pay | Admitting: Family Medicine

## 2015-12-18 NOTE — Telephone Encounter (Signed)
ASE NOTE: All timestamps contained within this report are represented as [place] Standard Time. CONFIDENTIALTY NOTICE: This fax transmission is intended only for the addressee. It contains information that is legally privileged, confidential or otherwise protected from use or disclosure. If you are not the intended recipient, you are strictly prohibited from reviewing, disclosing, copying using or disseminating any of this information or taking any action in reliance on or regarding this information. If you have received this fax in error, please notify us immediately by telephone so that we can arrange for its return to us. Phone: [phone], Toll-Free: [phone], Fax: [phone] Page: 1 of 1 Call Id: [id number] [facility] - Client TELEPHONE ADVICE RECORD [facility] Patient Name: Lenice LlamasGUSTIN Xia DOB: [birth year] Initial Comment Caller states they are experiencing unusual tiredness throughout the day. Was given rx for antidepressants and cymbalta. Thinks this might be a side effect of medication Nurse Assessment Nurse: Debera Latalston, RN, Tinnie GensJeffrey Date/Time Lamount Cohen(Eastern Time): 12/18/2015 2:59:26 PM Confirm and document reason for call. If symptomatic, describe symptoms. You must click the next button to save text entered. ---Caller states they are experiencing unusual tiredness throughout the day. Was given rx for antidepressants and cymbalta. Thinks this might be a side effect of medication. On medication for about a week. Has the patient traveled out of the country within the last 30 days? ---No Does the patient have any new or worsening symptoms? ---Yes Will a triage be completed? ---Yes Related visit to physician within the last 2 weeks? ---No Does the PT have any chronic conditions? (i.e. diabetes, asthma, etc.) ---Yes List chronic conditions. ---depression Is this a behavioral health or substance abuse call? ---No Guidelines Guideline Title  Affirmed Question Affirmed Notes Weakness (Generalized) and Fatigue [1] Fatigue (i.e., tires easily, decreased energy) AND [2] persists > 1 week Final Disposition User See PCP When Office is Open (within 3 days) Debera Latalston, Charity fundraiserN, Tinnie GensJeffrey Comments Wanting call from PCP about possible side effects of meds. Referrals REFERRED TO PCP OFFICE Disagree/Comply: Comply

## 2015-12-18 NOTE — Telephone Encounter (Signed)
Spoke to the pt.  Has has been feeling fatigue on Cymbalta.  Getting ready to start a new job in [place] and wants to be ready.  Spoke to Dr. Clent RidgesFry who advised that Cymbalta can cause fatigue.  He suggested the pt come in and talk to [place] GapJulia and to NOT stop the Cymbalta until seen.  Pt scheduled 12/22/15 @ 11:30 with Kandee Keenory.  That is the only day that the pt was available.  Will forward to [place]ory and Julia.

## 2015-12-19 DIAGNOSIS — M9902 Segmental and somatic dysfunction of thoracic region: Secondary | ICD-10-CM | POA: Diagnosis not present

## 2015-12-19 DIAGNOSIS — M5417 Radiculopathy, lumbosacral region: Secondary | ICD-10-CM | POA: Diagnosis not present

## 2015-12-19 DIAGNOSIS — M9903 Segmental and somatic dysfunction of lumbar region: Secondary | ICD-10-CM | POA: Diagnosis not present

## 2015-12-19 DIAGNOSIS — M5414 Radiculopathy, thoracic region: Secondary | ICD-10-CM | POA: Diagnosis not present

## 2015-12-19 DIAGNOSIS — M5137 Other intervertebral disc degeneration, lumbosacral region: Secondary | ICD-10-CM | POA: Diagnosis not present

## 2015-12-19 DIAGNOSIS — M9905 Segmental and somatic dysfunction of pelvic region: Secondary | ICD-10-CM | POA: Diagnosis not present

## 2015-12-21 DIAGNOSIS — N189 Chronic kidney disease, unspecified: Secondary | ICD-10-CM | POA: Diagnosis not present

## 2015-12-21 DIAGNOSIS — Z940 Kidney transplant status: Secondary | ICD-10-CM | POA: Diagnosis not present

## 2015-12-22 ENCOUNTER — Ambulatory Visit (INDEPENDENT_AMBULATORY_CARE_PROVIDER_SITE_OTHER): Payer: Medicare Other | Admitting: Adult Health

## 2015-12-22 ENCOUNTER — Encounter: Payer: Self-pay | Admitting: Adult Health

## 2015-12-22 VITALS — BP 154/80 | Temp 98.1°F | Wt 157.3 lb

## 2015-12-22 DIAGNOSIS — M5414 Radiculopathy, thoracic region: Secondary | ICD-10-CM | POA: Diagnosis not present

## 2015-12-22 DIAGNOSIS — M5137 Other intervertebral disc degeneration, lumbosacral region: Secondary | ICD-10-CM | POA: Diagnosis not present

## 2015-12-22 DIAGNOSIS — R5382 Chronic fatigue, unspecified: Secondary | ICD-10-CM | POA: Diagnosis not present

## 2015-12-22 DIAGNOSIS — M9905 Segmental and somatic dysfunction of pelvic region: Secondary | ICD-10-CM | POA: Diagnosis not present

## 2015-12-22 DIAGNOSIS — M9902 Segmental and somatic dysfunction of thoracic region: Secondary | ICD-10-CM | POA: Diagnosis not present

## 2015-12-22 DIAGNOSIS — M5417 Radiculopathy, lumbosacral region: Secondary | ICD-10-CM | POA: Diagnosis not present

## 2015-12-22 DIAGNOSIS — M9903 Segmental and somatic dysfunction of lumbar region: Secondary | ICD-10-CM | POA: Diagnosis not present

## 2015-12-22 DIAGNOSIS — I10 Essential (primary) hypertension: Secondary | ICD-10-CM | POA: Diagnosis not present

## 2015-12-22 DIAGNOSIS — E212 Other hyperparathyroidism: Secondary | ICD-10-CM | POA: Diagnosis not present

## 2015-12-22 DIAGNOSIS — N182 Chronic kidney disease, stage 2 (mild): Secondary | ICD-10-CM | POA: Diagnosis not present

## 2015-12-22 DIAGNOSIS — D631 Anemia in chronic kidney disease: Secondary | ICD-10-CM | POA: Diagnosis not present

## 2015-12-22 DIAGNOSIS — Z940 Kidney transplant status: Secondary | ICD-10-CM | POA: Diagnosis not present

## 2015-12-22 LAB — CBC WITH DIFFERENTIAL/PLATELET
BASOS PCT: 0.3 % (ref 0.0–3.0)
Basophils Absolute: 0 10*3/uL (ref 0.0–0.1)
EOS ABS: 0.5 10*3/uL (ref 0.0–0.7)
Eosinophils Relative: 3.6 % (ref 0.0–5.0)
HEMATOCRIT: 47.5 % (ref 39.0–52.0)
Hemoglobin: 15.7 g/dL (ref 13.0–17.0)
Lymphocytes Relative: 13.8 % (ref 12.0–46.0)
Lymphs Abs: 1.8 10*3/uL (ref 0.7–4.0)
MCHC: 33.1 g/dL (ref 30.0–36.0)
MCV: 96.2 fl (ref 78.0–100.0)
MONO ABS: 0.8 10*3/uL (ref 0.1–1.0)
Monocytes Relative: 6 % (ref 3.0–12.0)
NEUTROS ABS: 9.8 10*3/uL — AB (ref 1.4–7.7)
Neutrophils Relative %: 76.3 % (ref 43.0–77.0)
PLATELETS: 278 10*3/uL (ref 150.0–400.0)
RBC: 4.94 Mil/uL (ref 4.22–5.81)
RDW: 15.1 % (ref 11.5–15.5)
WBC: 12.8 10*3/uL — ABNORMAL HIGH (ref 4.0–10.5)

## 2015-12-22 LAB — TSH: TSH: 1.35 u[IU]/mL (ref 0.35–4.50)

## 2015-12-22 LAB — VITAMIN D 25 HYDROXY (VIT D DEFICIENCY, FRACTURES): VITD: 44.5 ng/mL (ref 30.00–100.00)

## 2015-12-22 NOTE — Progress Notes (Signed)
Subjective:    Patient ID: Mark Miranda, male    DOB: 17-Dec-1970, 45 y.o.   MRN: [id number]  HPI 45 year old male, patient of NP Melonie Florida presents to the clinic today for increasing fatigue. He is unsure if the Cymbalta he was prescribed by Neurology on 11/24/2015 or if it is the increase in Zoloft that was prescribed on 11/23/2015.   He reports that he has been on other antidepressants in the past, all of which have made him fatigued.   Reports that he went to his Nephrologist yesterday and " all the blood work came back fine."   He would like to have his testosterone checked.   Review of Systems  Constitutional: Positive for activity change and fatigue. Negative for fever, chills, diaphoresis and unexpected weight change.  HENT: Negative.   Respiratory: Negative.   Cardiovascular: Negative.   Neurological: Negative.   Psychiatric/Behavioral: Positive for decreased concentration.   Past Medical History  Diagnosis Date  . Diabetes mellitus   . Renal disorder   . Hypertension   . Hypercholesterolemia   . Depression   . Migraines   . Diabetic peripheral neuropathy (HCC) 11/24/2015    Social History   Social History  . Marital Status: Married    Spouse Name: N/A  . Number of Children: 4  . Years of Education: 10   Occupational History  . [employer]    Social History Main Topics  . Smoking status: Current Every Day Smoker -- 1.00 packs/day for 22 years    Types: Cigarettes  . Smokeless tobacco: Never Used  . Alcohol Use: 0.0 oz/week    0 Standard drinks or equivalent per week     Comment: socially  . Drug Use: No  . Sexual Activity: Not on file   Other Topics Concern  . Not on file   Social History Narrative   Paints houses   Wife works at a Retail banker at [employer]   4 children age 91 to 35   Regular exercise-no current exercise regimen   Caffeine Use-no    Past Surgical History  Procedure Laterality Date  . Peritoneal catheter insertion    . Capd  insertion  01/09/2012    Procedure: CONTINUOUS AMBULATORY PERITONEAL DIALYSIS  (CAPD) CATHETER INSERTION;  Surgeon: Ernestene Mention, MD;  Location: [facility] OR;  Service: General;  Laterality: N/A;  Exteriorization PD Cath  . Insertion of dialysis catheter  01/13/2012    Procedure: INSERTION OF DIALYSIS CATHETER;  Surgeon: Larina Earthly, MD;  Location: [facility] OR;  Service: Vascular;  Laterality: Right;  Internal Jugular  . Av fistula placement  01/10/2012    Procedure: ARTERIOVENOUS (AV) FISTULA CREATION;  Surgeon: Sherren Kerns, MD;  Location: [facility] OR;  Service: Vascular;  Laterality: Left;  Left Brachial Cephalic Arteriovenous Fistula  . Laparoscopy  03/13/2012    Procedure: LAPAROSCOPY DIAGNOSTIC;  Surgeon: Ardeth Sportsman, MD;  Location: [facility] OR;  Service: General;  Laterality: N/A;  . Kidney transplant      Family History  Problem Relation Age of Onset  . Diabetes Mother   . Hypertension Mother   . Hyperlipidemia Mother   . Cancer Father     glioblastoma    No Known Allergies  Current Outpatient Prescriptions on File Prior to Visit  Medication Sig Dispense Refill  . DULoxetine (CYMBALTA) 30 MG capsule One tablet daily for 2 weeks, then take one twice a day 60 capsule 3  . fludrocortisone (FLORINEF) 0.1 MG tablet Take 0.1 mg  by mouth daily.    Marland Kitchen. gabapentin (NEURONTIN) 300 MG capsule One capsule in the morning and two in the evening 90 capsule 3  . lisinopril (PRINIVIL,ZESTRIL) 5 MG tablet Take 5 mg by mouth daily.    . metoCLOPramide (REGLAN) 5 MG tablet Take 5 mg by mouth 4 (four) times daily.    . mycophenolate (MYFORTIC) 180 MG EC tablet Take 180 mg by mouth 2 (two) times daily.    . sertraline (ZOLOFT) 50 MG tablet Take 1/2 pill daily for 1 week and increase to 1 pill daily for second week as tolerated 30 tablet 1  . sulfamethoxazole-trimethoprim (BACTRIM,SEPTRA) 400-80 MG tablet Take 1 tablet by mouth 3 (three) times a week.    . tacrolimus (PROGRAF) 1 MG capsule Take 1 mg by mouth. Pt take 5  in the morning and 4 at night    . Vitamin D, Ergocalciferol, (DRISDOL) 50000 units CAPS capsule Take 50,000 Units by mouth every 7 (seven) days.    Marland Kitchen. acetaminophen (TYLENOL) 500 MG tablet Take 1,000 mg by mouth. Reported on 12/22/2015    . aspirin EC 81 MG tablet Take 81 mg by mouth. Reported on 12/22/2015    . valGANciclovir (VALCYTE) 450 MG tablet Take 450 mg by mouth daily. Reported on 12/22/2015    . [DISCONTINUED] diltiazem (CARDIZEM) 30 MG tablet Take 30 mg by mouth 4 (four) times daily. Ask patient to verify dosage and frequency. Not indicated on medical history form dated 10/23/10.     . [DISCONTINUED] insulin lispro (HUMALOG) 100 UNIT/ML injection Inject into the skin 3 (three) times daily before meals. Ask patient to verify name/dosage/. History form noted dosage of 34 units during day and 22 units at night.      No current facility-administered medications on file prior to visit.    BP 154/80 mmHg  Temp(Src) 98.1 F (36.7 C) (Oral)  Wt 157 lb 4.8 oz (71.351 kg)       Objective:   Physical Exam  Constitutional: He is oriented to person, place, and time. He appears well-developed and well-nourished. No distress.  HENT:  Head: Normocephalic and atraumatic.  Right Ear: External ear normal.  Left Ear: External ear normal.  Nose: Nose normal.  Mouth/Throat: Oropharynx is clear and moist. No oropharyngeal exudate.  Eyes: Conjunctivae are normal. Pupils are equal, round, and reactive to light. Right eye exhibits no discharge. Left eye exhibits no discharge.  Neck: Normal range of motion. Neck supple. No thyromegaly present.  Cardiovascular: Normal rate, regular rhythm, normal heart sounds and intact distal pulses.  Exam reveals no gallop and no friction rub.   No murmur heard. Pulmonary/Chest: Effort normal and breath sounds normal. No respiratory distress. He has no wheezes. He has no rales. He exhibits no tenderness.  Lymphadenopathy:    He has no cervical adenopathy.    Neurological: He is alert and oriented to person, place, and time.  Skin: Skin is warm and dry. No rash noted. He is not diaphoretic. No erythema. No pallor.  Psychiatric: He has a normal mood and affect. His behavior is normal. Judgment and thought content normal.  Vitals reviewed.     Assessment & Plan:  1. Chronic fatigue - Advised to cut back Zoloft to 50 mg and to take Cymbalta every other day.  - TSH - CBC with Differential/Platelet - Vitamin D, 25-hydroxy - Needs to have Testosterone checked between 7 am - 10 am. Currently it is to late in the day to have it checked for  an accurate result.  - Follow up with PCP as needed  Shirline Frees, NP

## 2015-12-22 NOTE — Patient Instructions (Addendum)
As discussed,   I will follow up with your labs.   Take the Cymbalta every other day   Return to 50 mg ( one pill) of the Zoloft.   Follow up as needed  Fatigue Fatigue is feeling tired all of the time, a lack of energy, or a lack of motivation. Occasional or mild fatigue is often a normal response to activity or life in general. However, long-lasting (chronic) or extreme fatigue may indicate an underlying medical condition. HOME CARE INSTRUCTIONS  Watch your fatigue for any changes. The following actions may help to lessen any discomfort you are feeling:  Talk to your health care provider about how much sleep you need each night. Try to get the required amount every night.  Take medicines only as directed by your health care provider.  Eat a healthy and nutritious diet. Ask your health care provider if you need help changing your diet.  Drink enough fluid to keep your urine clear or pale yellow.  Practice ways of relaxing, such as yoga, meditation, massage therapy, or acupuncture.  Exercise regularly.   Change situations that cause you stress. Try to keep your work and personal routine reasonable.  Do not abuse illegal drugs.  Limit alcohol intake to no more than 1 drink per day for nonpregnant women and 2 drinks per day for men. One drink equals 12 ounces of beer, 5 ounces of wine, or 1 ounces of hard liquor.  Take a multivitamin, if directed by your health care provider. SEEK MEDICAL CARE IF:   Your fatigue does not get better.  You have a fever.   You have unintentional weight loss or gain.  You have headaches.   You have difficulty:   Falling asleep.  Sleeping throughout the night.  You feel angry, guilty, anxious, or sad.   You are unable to have a bowel movement (constipation).   You skin is dry.   Your legs or another part of your body is swollen.  SEEK IMMEDIATE MEDICAL CARE IF:   You feel confused.   Your vision is blurry.  You feel  faint or pass out.   You have a severe headache.   You have severe abdominal, pelvic, or back pain.   You have chest pain, shortness of breath, or an irregular or fast heartbeat.   You are unable to urinate or you urinate less than normal.   You develop abnormal bleeding, such as bleeding from the rectum, vagina, nose, lungs, or nipples.  You vomit blood.   You have thoughts about harming yourself or committing suicide.   You are worried that you might harm someone else.    This information is not intended to replace advice given to you by your health care provider. Make sure you discuss any questions you have with your health care provider.   Document Released: 05/22/2007 Document Revised: 08/15/2014 Document Reviewed: 11/26/2013 Elsevier Interactive Patient Education [employer]2016 Elsevier Inc.

## 2015-12-24 ENCOUNTER — Other Ambulatory Visit: Payer: Self-pay | Admitting: *Deleted

## 2015-12-24 DIAGNOSIS — F329 Major depressive disorder, single episode, unspecified: Secondary | ICD-10-CM

## 2015-12-24 DIAGNOSIS — F32A Depression, unspecified: Secondary | ICD-10-CM

## 2015-12-24 MED ORDER — SERTRALINE HCL 50 MG PO TABS: 50.0000 mg | ORAL_TABLET | Freq: Every day | ORAL | Status: DC

## 2015-12-24 NOTE — Telephone Encounter (Signed)
Received faxed refill request for Sertraline 50mg  tablet.

## 2015-12-26 DIAGNOSIS — M9902 Segmental and somatic dysfunction of thoracic region: Secondary | ICD-10-CM | POA: Diagnosis not present

## 2015-12-26 DIAGNOSIS — M5137 Other intervertebral disc degeneration, lumbosacral region: Secondary | ICD-10-CM | POA: Diagnosis not present

## 2015-12-26 DIAGNOSIS — M9903 Segmental and somatic dysfunction of lumbar region: Secondary | ICD-10-CM | POA: Diagnosis not present

## 2015-12-26 DIAGNOSIS — M5417 Radiculopathy, lumbosacral region: Secondary | ICD-10-CM | POA: Diagnosis not present

## 2015-12-26 DIAGNOSIS — M5414 Radiculopathy, thoracic region: Secondary | ICD-10-CM | POA: Diagnosis not present

## 2015-12-26 DIAGNOSIS — M9905 Segmental and somatic dysfunction of pelvic region: Secondary | ICD-10-CM | POA: Diagnosis not present

## 2016-01-09 DIAGNOSIS — M5414 Radiculopathy, thoracic region: Secondary | ICD-10-CM | POA: Diagnosis not present

## 2016-01-09 DIAGNOSIS — M5417 Radiculopathy, lumbosacral region: Secondary | ICD-10-CM | POA: Diagnosis not present

## 2016-01-09 DIAGNOSIS — M9902 Segmental and somatic dysfunction of thoracic region: Secondary | ICD-10-CM | POA: Diagnosis not present

## 2016-01-09 DIAGNOSIS — M9903 Segmental and somatic dysfunction of lumbar region: Secondary | ICD-10-CM | POA: Diagnosis not present

## 2016-01-09 DIAGNOSIS — M5137 Other intervertebral disc degeneration, lumbosacral region: Secondary | ICD-10-CM | POA: Diagnosis not present

## 2016-01-09 DIAGNOSIS — M9905 Segmental and somatic dysfunction of pelvic region: Secondary | ICD-10-CM | POA: Diagnosis not present

## 2016-01-13 DIAGNOSIS — M5137 Other intervertebral disc degeneration, lumbosacral region: Secondary | ICD-10-CM | POA: Diagnosis not present

## 2016-01-13 DIAGNOSIS — M5417 Radiculopathy, lumbosacral region: Secondary | ICD-10-CM | POA: Diagnosis not present

## 2016-01-13 DIAGNOSIS — M5414 Radiculopathy, thoracic region: Secondary | ICD-10-CM | POA: Diagnosis not present

## 2016-01-13 DIAGNOSIS — M9903 Segmental and somatic dysfunction of lumbar region: Secondary | ICD-10-CM | POA: Diagnosis not present

## 2016-01-13 DIAGNOSIS — M9905 Segmental and somatic dysfunction of pelvic region: Secondary | ICD-10-CM | POA: Diagnosis not present

## 2016-01-13 DIAGNOSIS — M9902 Segmental and somatic dysfunction of thoracic region: Secondary | ICD-10-CM | POA: Diagnosis not present

## 2016-01-15 DIAGNOSIS — M79661 Pain in right lower leg: Secondary | ICD-10-CM | POA: Diagnosis not present

## 2016-01-15 DIAGNOSIS — M79671 Pain in right foot: Secondary | ICD-10-CM | POA: Diagnosis not present

## 2016-01-15 DIAGNOSIS — G894 Chronic pain syndrome: Secondary | ICD-10-CM | POA: Diagnosis not present

## 2016-01-15 DIAGNOSIS — Z79891 Long term (current) use of opiate analgesic: Secondary | ICD-10-CM | POA: Diagnosis not present

## 2016-01-15 DIAGNOSIS — M545 Low back pain: Secondary | ICD-10-CM | POA: Diagnosis not present

## 2016-01-15 DIAGNOSIS — F330 Major depressive disorder, recurrent, mild: Secondary | ICD-10-CM | POA: Diagnosis not present

## 2016-01-16 DIAGNOSIS — M5137 Other intervertebral disc degeneration, lumbosacral region: Secondary | ICD-10-CM | POA: Diagnosis not present

## 2016-01-16 DIAGNOSIS — M5414 Radiculopathy, thoracic region: Secondary | ICD-10-CM | POA: Diagnosis not present

## 2016-01-16 DIAGNOSIS — M9903 Segmental and somatic dysfunction of lumbar region: Secondary | ICD-10-CM | POA: Diagnosis not present

## 2016-01-16 DIAGNOSIS — M9902 Segmental and somatic dysfunction of thoracic region: Secondary | ICD-10-CM | POA: Diagnosis not present

## 2016-01-16 DIAGNOSIS — M5417 Radiculopathy, lumbosacral region: Secondary | ICD-10-CM | POA: Diagnosis not present

## 2016-01-16 DIAGNOSIS — M9905 Segmental and somatic dysfunction of pelvic region: Secondary | ICD-10-CM | POA: Diagnosis not present

## 2016-01-19 DIAGNOSIS — M9903 Segmental and somatic dysfunction of lumbar region: Secondary | ICD-10-CM | POA: Diagnosis not present

## 2016-01-19 DIAGNOSIS — M9905 Segmental and somatic dysfunction of pelvic region: Secondary | ICD-10-CM | POA: Diagnosis not present

## 2016-01-19 DIAGNOSIS — M5417 Radiculopathy, lumbosacral region: Secondary | ICD-10-CM | POA: Diagnosis not present

## 2016-01-19 DIAGNOSIS — M5137 Other intervertebral disc degeneration, lumbosacral region: Secondary | ICD-10-CM | POA: Diagnosis not present

## 2016-01-19 DIAGNOSIS — M5414 Radiculopathy, thoracic region: Secondary | ICD-10-CM | POA: Diagnosis not present

## 2016-01-19 DIAGNOSIS — M9902 Segmental and somatic dysfunction of thoracic region: Secondary | ICD-10-CM | POA: Diagnosis not present

## 2016-01-23 DIAGNOSIS — M9902 Segmental and somatic dysfunction of thoracic region: Secondary | ICD-10-CM | POA: Diagnosis not present

## 2016-01-23 DIAGNOSIS — M5414 Radiculopathy, thoracic region: Secondary | ICD-10-CM | POA: Diagnosis not present

## 2016-01-23 DIAGNOSIS — M9905 Segmental and somatic dysfunction of pelvic region: Secondary | ICD-10-CM | POA: Diagnosis not present

## 2016-01-23 DIAGNOSIS — M5137 Other intervertebral disc degeneration, lumbosacral region: Secondary | ICD-10-CM | POA: Diagnosis not present

## 2016-01-23 DIAGNOSIS — M5417 Radiculopathy, lumbosacral region: Secondary | ICD-10-CM | POA: Diagnosis not present

## 2016-01-23 DIAGNOSIS — M9903 Segmental and somatic dysfunction of lumbar region: Secondary | ICD-10-CM | POA: Diagnosis not present

## 2016-01-25 ENCOUNTER — Ambulatory Visit (INDEPENDENT_AMBULATORY_CARE_PROVIDER_SITE_OTHER): Payer: Medicare Other | Admitting: Family Medicine

## 2016-01-25 ENCOUNTER — Encounter: Payer: Self-pay | Admitting: Family Medicine

## 2016-01-25 VITALS — BP 120/60 | HR 77 | Temp 99.3°F | Wt 157.0 lb

## 2016-01-25 DIAGNOSIS — M5137 Other intervertebral disc degeneration, lumbosacral region: Secondary | ICD-10-CM | POA: Diagnosis not present

## 2016-01-25 DIAGNOSIS — M9902 Segmental and somatic dysfunction of thoracic region: Secondary | ICD-10-CM | POA: Diagnosis not present

## 2016-01-25 DIAGNOSIS — M9905 Segmental and somatic dysfunction of pelvic region: Secondary | ICD-10-CM | POA: Diagnosis not present

## 2016-01-25 DIAGNOSIS — R5382 Chronic fatigue, unspecified: Secondary | ICD-10-CM | POA: Diagnosis not present

## 2016-01-25 DIAGNOSIS — M5414 Radiculopathy, thoracic region: Secondary | ICD-10-CM | POA: Diagnosis not present

## 2016-01-25 DIAGNOSIS — M9903 Segmental and somatic dysfunction of lumbar region: Secondary | ICD-10-CM | POA: Diagnosis not present

## 2016-01-25 DIAGNOSIS — M5417 Radiculopathy, lumbosacral region: Secondary | ICD-10-CM | POA: Diagnosis not present

## 2016-01-25 NOTE — Patient Instructions (Signed)
Please take Cymbalta one tablet in the morning and one in the evening.  Gabapentin may be causing your feeling of fatigue that you have at this time. Please  Fatigue Fatigue is feeling tired all of the time, a lack of energy, or a lack of motivation. Occasional or mild fatigue is often a normal response to activity or life in general. However, long-lasting (chronic) or extreme fatigue may indicate an underlying medical condition. HOME CARE INSTRUCTIONS  Watch your fatigue for any changes. The following actions may help to lessen any discomfort you are feeling:  Talk to your health care provider about how much sleep you need each night. Try to get the required amount every night.  Take medicines only as directed by your health care provider.  Eat a healthy and nutritious diet. Ask your health care provider if you need help changing your diet.  Drink enough fluid to keep your urine clear or pale yellow.  Practice ways of relaxing, such as yoga, meditation, massage therapy, or acupuncture.  Exercise regularly.   Change situations that cause you stress. Try to keep your work and personal routine reasonable.  Do not abuse illegal drugs.  Limit alcohol intake to no more than 1 drink per day for nonpregnant women and 2 drinks per day for men. One drink equals 12 ounces of beer, 5 ounces of wine, or 1 ounces of hard liquor.  Take a multivitamin, if directed by your health care provider. SEEK MEDICAL CARE IF:   Your fatigue does not get better.  You have a fever.   You have unintentional weight loss or gain.  You have headaches.   You have difficulty:   Falling asleep.  Sleeping throughout the night.  You feel angry, guilty, anxious, or sad.   You are unable to have a bowel movement (constipation).   You skin is dry.   Your legs or another part of your body is swollen.  SEEK IMMEDIATE MEDICAL CARE IF:   You feel confused.   Your vision is blurry.  You feel  faint or pass out.   You have a severe headache.   You have severe abdominal, pelvic, or back pain.   You have chest pain, shortness of breath, or an irregular or fast heartbeat.   You are unable to urinate or you urinate less than normal.   You develop abnormal bleeding, such as bleeding from the rectum, vagina, nose, lungs, or nipples.  You vomit blood.   You have thoughts about harming yourself or committing suicide.   You are worried that you might harm someone else.    This information is not intended to replace advice given to you by your health care provider. Make sure you discuss any questions you have with your health care provider.   Document Released: 05/22/2007 Document Revised: 08/15/2014 Document Reviewed: 11/26/2013 Elsevier Interactive Patient Education [employer] Elsevier Inc.

## 2016-01-25 NOTE — Progress Notes (Signed)
Subjective:    Patient ID: Mark Miranda, male    DOB: 04/08/1971, 45 y.o.   MRN: [id number]  HPI  Mark Miranda is a 45 year old male who is here today for follow up regarding his antidepressant medication which he believes may be causing his fatigue symptom. He was seen 12/22/15 for chronic fatigue that he felt was worse with antidepressant medication.  Previously he was prescribed Cymbalta by Neurology on 11/24/2015 and Zoloft dose was increased on 11/23/2015. 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  On 12/22/2015 he was seen by another provider for these symptoms and advised to cut back on his Zoloft to 50 mg and take Cymbalta every other day to see if symptom of fatigue would improve. Symptom of fatigue has not improved with this change in medication regimen at this time. Additionally, lab work on 12/22/2015 was WNL and additional lab work by nephrology is reported as WNL per patient report.   Reviewing medications with patient, he reports taking taking gabapentin 300 mg one daily in the morning and 2 capsules in the evening for diabetic peripheral neuropathy. He is also being seen by a chronic pain management clinic for pain control for chronic lower back pain. He does not provide a clear history of pain management treatments. He does mention seeing a chiropractor which has provided some benefit.  Today, he reports taking Cymbalta 30 mg once daily and gabapentin 300 mg one capsules in the morning and 2 capsules in the evening. Of note, he is requesting adderal today which he believes will help his lack of concentration and feeling of fatigue.  PHQ-2 is a score of 1 today. He denies depressed mood or suicidal ideation. Denies adverse effects with medications but has difficulty describing medication regimen clearly.   He sleeps 8 hours every evening and reports sleeping through the night.  Review of Systems  Constitutional: Positive for fatigue. Negative for  chills.  Respiratory: Negative for cough and shortness of breath.   Cardiovascular: Negative for chest pain and palpitations.  Gastrointestinal: Negative for nausea, vomiting, abdominal pain and diarrhea.  Neurological: Negative for dizziness, light-headedness and headaches.  Psychiatric/Behavioral:       Denies depressed or anxious mood. Describes lack of attention when remembering phone numbers.   Past Medical History  Diagnosis Date  . Diabetes mellitus   . Renal disorder   . Hypertension   . Hypercholesterolemia   . Depression   . Migraines   . Diabetic peripheral neuropathy (HCC) 11/24/2015     Social History   Social History  . Marital Status: Married    Spouse Name: N/A  . Number of Children: 4  . 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 Smoking status: Current Every Day Smoker -- 1.00 packs/day for 22 years    Types: Cigarettes  . Smokeless tobacco: Never Used  . Alcohol Use: 0.0 oz/week    0 Standard drinks or equivalent per week     Comment: socially  . Drug Use: No  . Sexual Activity: Not on file   Other Topics Concern  . Not on file   Social History Narrative   Paints houses   Wife works at a Retail bankerseamtress at [employer]   4 children age 43 to 7816   Regular exercise-no current exercise regimen   Caffeine Use-no    Past Surgical History  Procedure Laterality Date  . Peritoneal catheter  insertion    . Capd insertion  01/09/2012    Procedure: CONTINUOUS AMBULATORY PERITONEAL DIALYSIS  (CAPD) CATHETER INSERTION;  Surgeon: Ernestene Mention, MD;  Location: [facility] OR;  Service: General;  Laterality: N/A;  Exteriorization PD Cath  . Insertion of dialysis catheter  01/13/2012    Procedure: INSERTION OF DIALYSIS CATHETER;  Surgeon: Larina Earthly, MD;  Location: [facility] OR;  Service: Vascular;  Laterality: Right;  Internal Jugular  . Av fistula placement  01/10/2012    Procedure: ARTERIOVENOUS (AV) FISTULA CREATION;  Surgeon: Sherren Kerns, MD;  Location: [facility] OR;  Service: Vascular;  Laterality: Left;  Left Brachial Cephalic Arteriovenous Fistula  . Laparoscopy  03/13/2012    Procedure: LAPAROSCOPY DIAGNOSTIC;  Surgeon: Ardeth Sportsman, MD;  Location: [facility] OR;  Service: General;  Laterality: N/A;  . Kidney transplant      Family History  Problem Relation Age of Onset  . Diabetes Mother   . Hypertension Mother   . Hyperlipidemia Mother   . Cancer Father     glioblastoma    No Known Allergies  Current Outpatient Prescriptions on File Prior to Visit  Medication Sig Dispense Refill  . DULoxetine (CYMBALTA) 30 MG capsule One tablet daily for 2 weeks, then take one twice a day 60 capsule 3  . fludrocortisone (FLORINEF) 0.1 MG tablet Take 0.1 mg by mouth daily.    Marland Kitchen gabapentin (NEURONTIN) 300 MG capsule One capsule in the morning and two in the evening 90 capsule 3  . lisinopril (PRINIVIL,ZESTRIL) 5 MG tablet Take 5 mg by mouth daily.    . mycophenolate (MYFORTIC) 180 MG EC tablet Take 180 mg by mouth 2 (two) times daily.    . sertraline (ZOLOFT) 50 MG tablet Take 1 tablet (50 mg total) by mouth daily. 30 tablet 5  . sulfamethoxazole-trimethoprim (BACTRIM,SEPTRA) 400-80 MG tablet Take 1 tablet by mouth 3 (three) times a week.    . tacrolimus (PROGRAF) 1 MG capsule Take 1 mg by mouth. Pt take 5 in the morning and 4 at night    . valGANciclovir (VALCYTE) 450 MG tablet Take 450 mg by mouth daily. Reported on 12/22/2015    . Vitamin D, Ergocalciferol, (DRISDOL) 50000 units CAPS capsule Take 50,000 Units by mouth every 7 (seven) days.    . [DISCONTINUED] diltiazem (CARDIZEM) 30 MG tablet Take 30 mg by mouth 4 (four) times daily. Ask patient to verify dosage and frequency. Not indicated on medical history form dated 10/23/10.     . [DISCONTINUED] insulin lispro (HUMALOG) 100 UNIT/ML injection Inject into the skin 3 (three) times daily before meals. Ask patient to verify name/dosage/. History form noted dosage of 34 units during day  and 22 units at night.      No current facility-administered medications on file prior to visit.    BP 120/60 mmHg  Pulse 77  Temp(Src) 99.3 F (37.4 C)  Wt 157 lb (71.215 kg)  SpO2 95%       Objective:   Physical Exam  Constitutional: He is oriented to person, place, and time. He appears well-developed and well-nourished.  Eyes: Pupils are equal, round, and reactive to light. No scleral icterus.  Neck: Neck supple.  Cardiovascular: Normal rate, regular rhythm and normal heart sounds.   Pulmonary/Chest: Effort normal and breath sounds normal. He has no wheezes. He has no rales.  Lymphadenopathy:    He has no cervical adenopathy.  Neurological: He is alert and oriented to person, place, and time.  Skin:  Skin is warm and dry. No rash noted.  Psychiatric: He has a normal mood and affect. His behavior is normal. Thought content normal.       Assessment & Plan:  1. Chronic fatigue Advised patient to decrease his gabapentin dose which may be contributing to feeling of fatigue. Recommended one 300 mg capsule in the morning and one in the evening with Cymbalta 30 mg  BID as ordered by Neurology. We further discussed the need for him to bring his medications with him to his next visit so a clear history of daily medications can be obtained.  Recommended the above changes for 2 weeks and advised follow up in 2 weeks for evaluation of symptoms.  Advised patient to keep his scheduled appointment with neurology. Also discussed with patient that medication for attention concerns requires an evaluation that can be completed by a behavioral health specialist.  Follow up in 2 weeks or sooner if needed.  Roddie Mc, FNP-C

## 2016-01-30 DIAGNOSIS — M9905 Segmental and somatic dysfunction of pelvic region: Secondary | ICD-10-CM | POA: Diagnosis not present

## 2016-01-30 DIAGNOSIS — M9902 Segmental and somatic dysfunction of thoracic region: Secondary | ICD-10-CM | POA: Diagnosis not present

## 2016-01-30 DIAGNOSIS — M9903 Segmental and somatic dysfunction of lumbar region: Secondary | ICD-10-CM | POA: Diagnosis not present

## 2016-01-30 DIAGNOSIS — M5137 Other intervertebral disc degeneration, lumbosacral region: Secondary | ICD-10-CM | POA: Diagnosis not present

## 2016-01-30 DIAGNOSIS — M5417 Radiculopathy, lumbosacral region: Secondary | ICD-10-CM | POA: Diagnosis not present

## 2016-01-30 DIAGNOSIS — M5414 Radiculopathy, thoracic region: Secondary | ICD-10-CM | POA: Diagnosis not present

## 2016-02-06 DIAGNOSIS — M5414 Radiculopathy, thoracic region: Secondary | ICD-10-CM | POA: Diagnosis not present

## 2016-02-06 DIAGNOSIS — M5137 Other intervertebral disc degeneration, lumbosacral region: Secondary | ICD-10-CM | POA: Diagnosis not present

## 2016-02-06 DIAGNOSIS — M9902 Segmental and somatic dysfunction of thoracic region: Secondary | ICD-10-CM | POA: Diagnosis not present

## 2016-02-06 DIAGNOSIS — M5417 Radiculopathy, lumbosacral region: Secondary | ICD-10-CM | POA: Diagnosis not present

## 2016-02-06 DIAGNOSIS — M9905 Segmental and somatic dysfunction of pelvic region: Secondary | ICD-10-CM | POA: Diagnosis not present

## 2016-02-06 DIAGNOSIS — M9903 Segmental and somatic dysfunction of lumbar region: Secondary | ICD-10-CM | POA: Diagnosis not present

## 2016-02-10 DIAGNOSIS — M9902 Segmental and somatic dysfunction of thoracic region: Secondary | ICD-10-CM | POA: Diagnosis not present

## 2016-02-10 DIAGNOSIS — M5417 Radiculopathy, lumbosacral region: Secondary | ICD-10-CM | POA: Diagnosis not present

## 2016-02-10 DIAGNOSIS — M5414 Radiculopathy, thoracic region: Secondary | ICD-10-CM | POA: Diagnosis not present

## 2016-02-10 DIAGNOSIS — M9905 Segmental and somatic dysfunction of pelvic region: Secondary | ICD-10-CM | POA: Diagnosis not present

## 2016-02-10 DIAGNOSIS — M5137 Other intervertebral disc degeneration, lumbosacral region: Secondary | ICD-10-CM | POA: Diagnosis not present

## 2016-02-10 DIAGNOSIS — M9903 Segmental and somatic dysfunction of lumbar region: Secondary | ICD-10-CM | POA: Diagnosis not present

## 2016-02-12 DIAGNOSIS — M9902 Segmental and somatic dysfunction of thoracic region: Secondary | ICD-10-CM | POA: Diagnosis not present

## 2016-02-12 DIAGNOSIS — M79604 Pain in right leg: Secondary | ICD-10-CM | POA: Diagnosis not present

## 2016-02-12 DIAGNOSIS — M5137 Other intervertebral disc degeneration, lumbosacral region: Secondary | ICD-10-CM | POA: Diagnosis not present

## 2016-02-12 DIAGNOSIS — M545 Low back pain: Secondary | ICD-10-CM | POA: Diagnosis not present

## 2016-02-12 DIAGNOSIS — M9905 Segmental and somatic dysfunction of pelvic region: Secondary | ICD-10-CM | POA: Diagnosis not present

## 2016-02-12 DIAGNOSIS — Z79891 Long term (current) use of opiate analgesic: Secondary | ICD-10-CM | POA: Diagnosis not present

## 2016-02-12 DIAGNOSIS — G894 Chronic pain syndrome: Secondary | ICD-10-CM | POA: Diagnosis not present

## 2016-02-12 DIAGNOSIS — M9903 Segmental and somatic dysfunction of lumbar region: Secondary | ICD-10-CM | POA: Diagnosis not present

## 2016-02-12 DIAGNOSIS — M5414 Radiculopathy, thoracic region: Secondary | ICD-10-CM | POA: Diagnosis not present

## 2016-02-12 DIAGNOSIS — M5417 Radiculopathy, lumbosacral region: Secondary | ICD-10-CM | POA: Diagnosis not present

## 2016-02-18 DIAGNOSIS — M5417 Radiculopathy, lumbosacral region: Secondary | ICD-10-CM | POA: Diagnosis not present

## 2016-02-18 DIAGNOSIS — M5414 Radiculopathy, thoracic region: Secondary | ICD-10-CM | POA: Diagnosis not present

## 2016-02-18 DIAGNOSIS — M9902 Segmental and somatic dysfunction of thoracic region: Secondary | ICD-10-CM | POA: Diagnosis not present

## 2016-02-18 DIAGNOSIS — M9903 Segmental and somatic dysfunction of lumbar region: Secondary | ICD-10-CM | POA: Diagnosis not present

## 2016-02-18 DIAGNOSIS — M9905 Segmental and somatic dysfunction of pelvic region: Secondary | ICD-10-CM | POA: Diagnosis not present

## 2016-02-18 DIAGNOSIS — M5137 Other intervertebral disc degeneration, lumbosacral region: Secondary | ICD-10-CM | POA: Diagnosis not present

## 2016-02-23 ENCOUNTER — Telehealth: Payer: Self-pay | Admitting: Family Medicine

## 2016-02-23 NOTE — Telephone Encounter (Signed)
Patient will need to be seen for evaluation of back pain before imaging can be ordered. Schedule OV

## 2016-02-23 NOTE — Telephone Encounter (Signed)
Pt has a pain management md and having back pain. Pt would like julie to order MRI for his back. Pt is aware NP out of office until Thursday. Pt states pain management md order years ago xray. Please advise

## 2016-02-23 NOTE — Telephone Encounter (Signed)
Pt has been sch

## 2016-02-25 ENCOUNTER — Encounter: Payer: Self-pay | Admitting: Family Medicine

## 2016-02-25 ENCOUNTER — Ambulatory Visit (INDEPENDENT_AMBULATORY_CARE_PROVIDER_SITE_OTHER): Payer: Medicare Other | Admitting: Family Medicine

## 2016-02-25 VITALS — BP 152/80 | HR 89 | Temp 98.7°F | Ht 67.0 in | Wt 154.3 lb

## 2016-02-25 DIAGNOSIS — M5441 Lumbago with sciatica, right side: Secondary | ICD-10-CM

## 2016-02-25 DIAGNOSIS — M545 Low back pain, unspecified: Secondary | ICD-10-CM | POA: Insufficient documentation

## 2016-02-25 NOTE — Progress Notes (Signed)
Pre visit review using our clinic review tool, if applicable. No additional management support is needed unless otherwise documented below in the visit note. 

## 2016-02-25 NOTE — Progress Notes (Signed)
Subjective:    Patient ID: Mark Miranda, male    DOB: [birth year], 45 y.o.   MRN: [id number]  HPI  Mark Miranda is a 45 year old male who presents today with lower back pain.  Location: Lower Back Pain  Quality: Sharp, rated as mild (2) but may progress to an 8 Onset: "Long term" Worse over the past 3 months Worse with: Standing and work during the day  Better with: Lying down and rest Radiation: Right leg however he has a history of neuropathy which has been present chronically Trauma: None Best sitting/standing/leaning forward: Pain improved with stretching and leaning forward does not provide additional benefit.  Red Flags Fecal/urinary incontinence:  No   Numbness/Weakness:  No; history of neuropathy  Fever/chills/sweats: No   Night pain: Has felt pain during the night and reports that he uses medication from his chronic pain management for relief.  He states that this has been a chronic issue for him Unexplained weight loss:  No Relief with bedrest:  Yes h/o cancer/immunosuppression:  No IV drug use: No  PMH of osteoporosis or chronic steroid use: No   Current treatment with chiropractic care which has provided limited benefit. Physical therapy has been used in the past approximately 2 to 3 years ago. Pain is currently managed in a chronic pain management clinic.  Review of Systems  Constitutional: Negative for fever, chills and fatigue.  Respiratory: Negative for cough and shortness of breath.   Cardiovascular: Negative for chest pain and palpitations.  Gastrointestinal: Negative for nausea, vomiting, abdominal pain and diarrhea.  Endocrine: Negative for polydipsia, polyphagia and polyuria.  Genitourinary: Negative for dysuria, frequency, hematuria and flank pain.  Musculoskeletal: Positive for back pain.  Skin: Negative for rash.  Neurological: Negative for dizziness, syncope, weakness, light-headedness and headaches.  Psychiatric/Behavioral:       Denies depressed or  anxious mood    Past Medical History  Diagnosis Date  . Diabetes mellitus   . Renal disorder   . Hypertension   . Hypercholesterolemia   . Depression   . Migraines   . Diabetic peripheral neuropathy (HCC) 11/24/2015     Social History   Social History  . Marital Status: Married    Spouse Name: N/A  . Number of Children: 4  . 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 Smoking status: Current Every Day Smoker -- 1.00 packs/day for 22 years    Types: Cigarettes  . Smokeless tobacco: Never Used  . Alcohol Use: 0.0 oz/week    0 Standard drinks or equivalent per week     Comment: socially  . Drug Use: No  . Sexual Activity: Not on file   Other Topics Concern  . Not on file   Social History Narrative   Paints houses   Wife works at a Retail bankerseamtress at [employer]   4 children age 43 to [age]   Regular exercise-no current exercise regimen   Caffeine Use-no    Past Surgical History  Procedure Laterality Date  . Peritoneal catheter insertion    . Capd insertion  01/09/2012    Procedure: CONTINUOUS AMBULATORY PERITONEAL DIALYSIS  (CAPD) CATHETER INSERTION;  Surgeon: Ernestene MentionHaywood M Ingram, MD;  Location: [facility] OR;  Service: General;  Laterality: N/A;  Exteriorization PD Cath  . Insertion of dialysis catheter  01/13/2012    Procedure: INSERTION OF DIALYSIS CATHETER;  Surgeon: Larina Earthlyodd F Early, MD;  [facility];  Service:  Vascular;  Laterality: Right;  Internal Jugular  . Av fistula placement  01/10/2012    Procedure: ARTERIOVENOUS (AV) FISTULA CREATION;  Surgeon: Sherren Kerns, MD;  Location: [facility] OR;  Service: Vascular;  Laterality: Left;  Left Brachial Cephalic Arteriovenous Fistula  . Laparoscopy  03/13/2012    Procedure: LAPAROSCOPY DIAGNOSTIC;  Surgeon: Ardeth Sportsman, MD;  Location: [facility] OR;  Service: General;  Laterality: N/A;  . Kidney transplant      Family History  Problem Relation Age of Onset  . Diabetes Mother   . Hypertension Mother   .  Hyperlipidemia Mother   . Cancer Father     glioblastoma    No Known Allergies  Current Outpatient Prescriptions on File Prior to Visit  Medication Sig Dispense Refill  . DULoxetine (CYMBALTA) 30 MG capsule One tablet daily for 2 weeks, then take one twice a day 60 capsule 3  . fludrocortisone (FLORINEF) 0.1 MG tablet Take 0.1 mg by mouth daily.    Marland Kitchen gabapentin (NEURONTIN) 300 MG capsule One capsule in the morning and two in the evening 90 capsule 3  . lisinopril (PRINIVIL,ZESTRIL) 5 MG tablet Take 5 mg by mouth daily.    . mycophenolate (MYFORTIC) 180 MG EC tablet Take 180 mg by mouth 2 (two) times daily.    . sertraline (ZOLOFT) 50 MG tablet Take 1 tablet (50 mg total) by mouth daily. 30 tablet 5  . sulfamethoxazole-trimethoprim (BACTRIM,SEPTRA) 400-80 MG tablet Take 1 tablet by mouth 3 (three) times a week.    . tacrolimus (PROGRAF) 1 MG capsule Take 1 mg by mouth. Pt take 5 in the morning and 4 at night    . valGANciclovir (VALCYTE) 450 MG tablet Take 450 mg by mouth daily. Reported on 12/22/2015    . Vitamin D, Ergocalciferol, (DRISDOL) 50000 units CAPS capsule Take 50,000 Units by mouth every 7 (seven) days.    . [DISCONTINUED] diltiazem (CARDIZEM) 30 MG tablet Take 30 mg by mouth 4 (four) times daily. Ask patient to verify dosage and frequency. Not indicated on medical history form dated 10/23/10.     . [DISCONTINUED] insulin lispro (HUMALOG) 100 UNIT/ML injection Inject into the skin 3 (three) times daily before meals. Ask patient to verify name/dosage/. History form noted dosage of 34 units during day and 22 units at night.      No current facility-administered medications on file prior to visit.    BP 152/80 mmHg  Pulse 89  Temp(Src) 98.7 F (37.1 C) (Oral)  Ht  (1.702 m)  Wt 154 lb 4.8 oz (69.99 kg)  BMI 24.16 kg/m2  SpO2 97%     Objective:   Physical Exam  Constitutional: He is oriented to person, place, and time. He appears well-developed and well-nourished.    Eyes: Pupils are equal, round, and reactive to light. No scleral icterus.  Neck: Neck supple.  Cardiovascular: Normal rate and regular rhythm.   Pulmonary/Chest: Effort normal and breath sounds normal. He has no wheezes. He has no rales.  Abdominal: Soft. Bowel sounds are normal. There is no tenderness.  Musculoskeletal: He exhibits no edema.  Spine with normal alignment and mild scoliosis is present. No tenderness to vertebral process with palpation with the exception of  mild tenderness around L5-S1.  Paraspinous muscles are tender and patient notes this area as painful and gripping when standing for extended periods of time. ROM is full at lumbar sacral regions. Negative Straight Leg raise. No CVA tenderness present. Able to heel/toe walk without  pain.   Lymphadenopathy:    He has no cervical adenopathy.  Neurological: He is alert and oriented to person, place, and time. He has normal strength. Coordination and gait normal.  Reflex Scores:      Brachioradialis reflexes are 2+ on the right side and 2+ on the left side.      Patellar reflexes are 2+ on the right side and 2+ on the left side. No sensory deficit noted upon exam; patient reports that he has an "altered sensation" in his right leg  Skin: Skin is warm and dry.  Psychiatric: He has a normal mood and affect. His behavior is normal. Judgment and thought content normal.       Assessment & Plan:  1. Bilateral low back pain with right-sided sciatica Suspect that symptoms originate from muscle strain however with radiation, nighttime pain, and exacerbation of symptoms over a 3 month period mentioned in history; a MRI will be ordered to rule out serious etiology of pain. Exam is reassuring today as no red flags for urgent imaging have been identified. Provided written instructions regarding stretching exercises that patient states he is familiar with as he is currently under the care of a chiropractor. Recommended follow up and plan  will be developed with results from MRI. Advised patient to follow up  Sooner if symptoms worsen, he develops a fever >101, or new symptoms develop. Patient voiced understanding and agreed with plan.  - MR Lumbar Spine Wo Contrast; Future  Roddie Mc, FNP-C

## 2016-02-25 NOTE — Patient Instructions (Addendum)
You will be contact about your referral.  Please let us know if you have not heard back within 1 week about your referral.   Back Exercises The following exercises strengthen the muscles that help to support the back. They also help to keep the lower back flexible. Doing these exercises can help to prevent back pain or lessen existing pain. If you have back pain or discomfort, try doing these exercises 2-3 times each day or as told by your health care provider. When the pain goes away, do them once each day, but increase the number of times that you repeat the steps for each exercise (do more repetitions). If you do not have back pain or discomfort, do these exercises once each day or as told by your health care provider. EXERCISES Single Knee to Chest Repeat these steps 3-5 times for each leg: 1. Lie on your back on a firm bed or the floor with your legs extended. 2. Bring one knee to your chest. Your other leg should stay extended and in contact with the floor. 3. Hold your knee in place by grabbing your knee or thigh. 4. Pull on your knee until you feel a gentle stretch in your lower back. 5. Hold the stretch for 10-30 seconds. 6. Slowly release and straighten your leg. Pelvic Tilt Repeat these steps 5-10 times: 1. Lie on your back on a firm bed or the floor with your legs extended. 2. Bend your knees so they are pointing toward the ceiling and your feet are flat on the floor. 3. Tighten your lower abdominal muscles to press your lower back against the floor. This motion will tilt your pelvis so your tailbone points up toward the ceiling instead of pointing to your feet or the floor. 4. With gentle tension and even breathing, hold this position for 5-10 seconds. Cat-Cow Repeat these steps until your lower back becomes more flexible: 1. Get into a hands-and-knees position on a firm surface. Keep your hands under your shoulders, and keep your knees under your hips. You may place padding under  your knees for comfort. 2. Let your head hang down, and point your tailbone toward the floor so your lower back becomes rounded like the back of a cat. 3. Hold this position for 5 seconds. 4. Slowly lift your head and point your tailbone up toward the ceiling so your back forms a sagging arch like the back of a cow. 5. Hold this position for 5 seconds. Press-Ups Repeat these steps 5-10 times: 1. Lie on your abdomen (face-down) on the floor. 2. Place your palms near your head, about shoulder-width apart. 3. While you keep your back as relaxed as possible and keep your hips on the floor, slowly straighten your arms to raise the top half of your body and lift your shoulders. Do not use your back muscles to raise your upper torso. You may adjust the placement of your hands to make yourself more comfortable. 4. Hold this position for 5 seconds while you keep your back relaxed. 5. Slowly return to lying flat on the floor. Bridges Repeat these steps 10 times: 1. Lie on your back on a firm surface. 2. Bend your knees so they are pointing toward the ceiling and your feet are flat on the floor. 3. Tighten your buttocks muscles and lift your buttocks off of the floor until your waist is at almost the same height as your knees. You should feel the muscles working in your buttocks and the back  of your thighs. If you do not feel these muscles, slide your feet 1-2 inches farther away from your buttocks. 4. Hold this position for 3-5 seconds. 5. Slowly lower your hips to the starting position, and allow your buttocks muscles to relax completely. If this exercise is too easy, try doing it with your arms crossed over your chest. Abdominal Crunches Repeat these steps 5-10 times: 1. Lie on your back on a firm bed or the floor with your legs extended. 2. Bend your knees so they are pointing toward the ceiling and your feet are flat on the floor. 3. Cross your arms over your chest. 4. Tip your chin slightly  toward your chest without bending your neck. 5. Tighten your abdominal muscles and slowly raise your trunk (torso) high enough to lift your shoulder blades a tiny bit off of the floor. Avoid raising your torso higher than that, because it can put too much stress on your low back and it does not help to strengthen your abdominal muscles. 6. Slowly return to your starting position. Back Lifts Repeat these steps 5-10 times: 1. Lie on your abdomen (face-down) with your arms at your sides, and rest your forehead on the floor. 2. Tighten the muscles in your legs and your buttocks. 3. Slowly lift your chest off of the floor while you keep your hips pressed to the floor. Keep the back of your head in line with the curve in your back. Your eyes should be looking at the floor. 4. Hold this position for 3-5 seconds. 5. Slowly return to your starting position. SEEK MEDICAL CARE IF:  Your back pain or discomfort gets much worse when you do an exercise.  Your back pain or discomfort does not lessen within 2 hours after you exercise. If you have any of these problems, stop doing these exercises right away. Do not do them again unless your health care provider says that you can. SEEK IMMEDIATE MEDICAL CARE IF:  You develop sudden, severe back pain. If this happens, stop doing the exercises right away. Do not do them again unless your health care provider says that you can.   This information is not intended to replace advice given to you by your health care provider. Make sure you discuss any questions you have with your health care provider.   Document Released: 09/01/2004 Document Revised: 04/15/2015 Document Reviewed: 09/18/2014 Elsevier Interactive Patient Education [employer]

## 2016-02-29 DIAGNOSIS — M5416 Radiculopathy, lumbar region: Secondary | ICD-10-CM | POA: Diagnosis not present

## 2016-02-29 DIAGNOSIS — M79604 Pain in right leg: Secondary | ICD-10-CM | POA: Diagnosis not present

## 2016-03-05 ENCOUNTER — Ambulatory Visit
Admission: RE | Admit: 2016-03-05 | Discharge: 2016-03-05 | Disposition: A | Discharge status: Home / Self Care | Payer: Medicare Other | Source: Ambulatory Visit | Attending: Family Medicine | Admitting: Family Medicine

## 2016-03-05 DIAGNOSIS — M5441 Lumbago with sciatica, right side: Secondary | ICD-10-CM

## 2016-03-05 DIAGNOSIS — M5127 Other intervertebral disc displacement, lumbosacral region: Secondary | ICD-10-CM | POA: Diagnosis not present

## 2016-03-07 ENCOUNTER — Other Ambulatory Visit: Payer: Self-pay | Admitting: Family Medicine

## 2016-03-07 ENCOUNTER — Telehealth: Payer: Self-pay | Admitting: Family Medicine

## 2016-03-07 DIAGNOSIS — G8929 Other chronic pain: Secondary | ICD-10-CM

## 2016-03-07 DIAGNOSIS — M545 Low back pain: Secondary | ICD-10-CM | POA: Diagnosis not present

## 2016-03-07 DIAGNOSIS — Z79891 Long term (current) use of opiate analgesic: Secondary | ICD-10-CM | POA: Diagnosis not present

## 2016-03-07 DIAGNOSIS — M546 Pain in thoracic spine: Secondary | ICD-10-CM | POA: Diagnosis not present

## 2016-03-07 DIAGNOSIS — G894 Chronic pain syndrome: Secondary | ICD-10-CM | POA: Diagnosis not present

## 2016-03-07 DIAGNOSIS — M79651 Pain in right thigh: Secondary | ICD-10-CM | POA: Diagnosis not present

## 2016-03-07 DIAGNOSIS — M79661 Pain in right lower leg: Secondary | ICD-10-CM | POA: Diagnosis not present

## 2016-03-07 DIAGNOSIS — M79671 Pain in right foot: Secondary | ICD-10-CM | POA: Diagnosis not present

## 2016-03-07 NOTE — Telephone Encounter (Signed)
Referral has been placed to [facility] as requested.

## 2016-03-07 NOTE — Telephone Encounter (Signed)
lmom cell he must come back office to sign medical release form

## 2016-03-07 NOTE — Telephone Encounter (Signed)
Spoke with patient. Informed pt that referral to [facility] had been made, Patient should received a call from the clinic to schedule an appointment.

## 2016-03-07 NOTE — Telephone Encounter (Signed)
Pt was given the results of mri today and needs the finding  fax to [facility] [phone]. Pt has mri at [facility]. Pt has an appt today at [email or web address] will see dr  Eileen Stanford foster. Pt has been going to [facility] and does not needs a referral

## 2016-03-07 NOTE — Telephone Encounter (Signed)
He came back into and signed a medical realease form and I gave him the information that her needed. He would also like a referal to:  [facility] [address]  [address] [place], [place] [address] [phone]

## 2016-03-08 DIAGNOSIS — M5137 Other intervertebral disc degeneration, lumbosacral region: Secondary | ICD-10-CM | POA: Diagnosis not present

## 2016-03-08 DIAGNOSIS — M9903 Segmental and somatic dysfunction of lumbar region: Secondary | ICD-10-CM | POA: Diagnosis not present

## 2016-03-08 DIAGNOSIS — M9902 Segmental and somatic dysfunction of thoracic region: Secondary | ICD-10-CM | POA: Diagnosis not present

## 2016-03-08 DIAGNOSIS — M5414 Radiculopathy, thoracic region: Secondary | ICD-10-CM | POA: Diagnosis not present

## 2016-03-08 DIAGNOSIS — M5417 Radiculopathy, lumbosacral region: Secondary | ICD-10-CM | POA: Diagnosis not present

## 2016-03-08 DIAGNOSIS — M9905 Segmental and somatic dysfunction of pelvic region: Secondary | ICD-10-CM | POA: Diagnosis not present

## 2016-03-10 ENCOUNTER — Ambulatory Visit: Payer: Medicare Other | Admitting: Family Medicine

## 2016-03-14 DIAGNOSIS — M25551 Pain in right hip: Secondary | ICD-10-CM | POA: Diagnosis not present

## 2016-03-14 DIAGNOSIS — M5137 Other intervertebral disc degeneration, lumbosacral region: Secondary | ICD-10-CM | POA: Diagnosis not present

## 2016-03-14 DIAGNOSIS — M5417 Radiculopathy, lumbosacral region: Secondary | ICD-10-CM | POA: Diagnosis not present

## 2016-03-14 DIAGNOSIS — M9905 Segmental and somatic dysfunction of pelvic region: Secondary | ICD-10-CM | POA: Diagnosis not present

## 2016-03-14 DIAGNOSIS — M9904 Segmental and somatic dysfunction of sacral region: Secondary | ICD-10-CM | POA: Diagnosis not present

## 2016-03-14 DIAGNOSIS — M9903 Segmental and somatic dysfunction of lumbar region: Secondary | ICD-10-CM | POA: Diagnosis not present

## 2016-03-15 DIAGNOSIS — M25551 Pain in right hip: Secondary | ICD-10-CM | POA: Diagnosis not present

## 2016-03-15 DIAGNOSIS — M9903 Segmental and somatic dysfunction of lumbar region: Secondary | ICD-10-CM | POA: Diagnosis not present

## 2016-03-15 DIAGNOSIS — M5137 Other intervertebral disc degeneration, lumbosacral region: Secondary | ICD-10-CM | POA: Diagnosis not present

## 2016-03-15 DIAGNOSIS — M9905 Segmental and somatic dysfunction of pelvic region: Secondary | ICD-10-CM | POA: Diagnosis not present

## 2016-03-15 DIAGNOSIS — M9904 Segmental and somatic dysfunction of sacral region: Secondary | ICD-10-CM | POA: Diagnosis not present

## 2016-03-15 DIAGNOSIS — M5417 Radiculopathy, lumbosacral region: Secondary | ICD-10-CM | POA: Diagnosis not present

## 2016-03-21 DIAGNOSIS — M9903 Segmental and somatic dysfunction of lumbar region: Secondary | ICD-10-CM | POA: Diagnosis not present

## 2016-03-21 DIAGNOSIS — M9904 Segmental and somatic dysfunction of sacral region: Secondary | ICD-10-CM | POA: Diagnosis not present

## 2016-03-21 DIAGNOSIS — M5137 Other intervertebral disc degeneration, lumbosacral region: Secondary | ICD-10-CM | POA: Diagnosis not present

## 2016-03-21 DIAGNOSIS — M25551 Pain in right hip: Secondary | ICD-10-CM | POA: Diagnosis not present

## 2016-03-21 DIAGNOSIS — M5417 Radiculopathy, lumbosacral region: Secondary | ICD-10-CM | POA: Diagnosis not present

## 2016-03-21 DIAGNOSIS — M9905 Segmental and somatic dysfunction of pelvic region: Secondary | ICD-10-CM | POA: Diagnosis not present

## 2016-04-01 ENCOUNTER — Ambulatory Visit: Payer: Medicare Other | Admitting: Nurse Practitioner

## 2016-04-07 DIAGNOSIS — M79671 Pain in right foot: Secondary | ICD-10-CM | POA: Diagnosis not present

## 2016-04-07 DIAGNOSIS — G894 Chronic pain syndrome: Secondary | ICD-10-CM | POA: Diagnosis not present

## 2016-04-07 DIAGNOSIS — M79604 Pain in right leg: Secondary | ICD-10-CM | POA: Diagnosis not present

## 2016-04-07 DIAGNOSIS — Z79891 Long term (current) use of opiate analgesic: Secondary | ICD-10-CM | POA: Diagnosis not present

## 2016-04-07 DIAGNOSIS — M546 Pain in thoracic spine: Secondary | ICD-10-CM | POA: Diagnosis not present

## 2016-04-08 ENCOUNTER — Ambulatory Visit: Payer: Medicare Other | Admitting: Nurse Practitioner

## 2016-04-29 DIAGNOSIS — N189 Chronic kidney disease, unspecified: Secondary | ICD-10-CM | POA: Diagnosis not present

## 2016-04-29 DIAGNOSIS — Z940 Kidney transplant status: Secondary | ICD-10-CM | POA: Diagnosis not present

## 2016-05-02 DIAGNOSIS — Z940 Kidney transplant status: Secondary | ICD-10-CM | POA: Diagnosis not present

## 2016-05-02 DIAGNOSIS — I10 Essential (primary) hypertension: Secondary | ICD-10-CM | POA: Diagnosis not present

## 2016-05-02 DIAGNOSIS — D631 Anemia in chronic kidney disease: Secondary | ICD-10-CM | POA: Diagnosis not present

## 2016-05-02 DIAGNOSIS — N182 Chronic kidney disease, stage 2 (mild): Secondary | ICD-10-CM | POA: Diagnosis not present

## 2016-06-06 DIAGNOSIS — M5416 Radiculopathy, lumbar region: Secondary | ICD-10-CM | POA: Diagnosis not present

## 2016-06-06 DIAGNOSIS — M47816 Spondylosis without myelopathy or radiculopathy, lumbar region: Secondary | ICD-10-CM | POA: Diagnosis not present

## 2016-06-06 DIAGNOSIS — B0223 Postherpetic polyneuropathy: Secondary | ICD-10-CM | POA: Diagnosis not present

## 2016-06-06 DIAGNOSIS — G894 Chronic pain syndrome: Secondary | ICD-10-CM | POA: Diagnosis not present

## 2016-06-20 DIAGNOSIS — M47816 Spondylosis without myelopathy or radiculopathy, lumbar region: Secondary | ICD-10-CM | POA: Diagnosis not present

## 2016-06-20 DIAGNOSIS — G894 Chronic pain syndrome: Secondary | ICD-10-CM | POA: Diagnosis not present

## 2016-06-20 DIAGNOSIS — M5416 Radiculopathy, lumbar region: Secondary | ICD-10-CM | POA: Diagnosis not present

## 2016-06-20 DIAGNOSIS — Z23 Encounter for immunization: Secondary | ICD-10-CM | POA: Diagnosis not present

## 2016-07-04 DIAGNOSIS — G894 Chronic pain syndrome: Secondary | ICD-10-CM | POA: Diagnosis not present

## 2016-07-04 DIAGNOSIS — M47816 Spondylosis without myelopathy or radiculopathy, lumbar region: Secondary | ICD-10-CM | POA: Diagnosis not present

## 2016-07-04 DIAGNOSIS — M5416 Radiculopathy, lumbar region: Secondary | ICD-10-CM | POA: Diagnosis not present

## 2016-07-19 ENCOUNTER — Telehealth: Payer: Self-pay | Admitting: Family Medicine

## 2016-07-19 NOTE — Telephone Encounter (Signed)
I am forwarding this to you.  

## 2016-07-19 NOTE — Telephone Encounter (Signed)
Pt would like to know who you would recommend for a spine specialist for his bulging disk. Pt would like a referral to whomever you advise.

## 2016-07-20 ENCOUNTER — Other Ambulatory Visit: Payer: Self-pay | Admitting: Family Medicine

## 2016-07-20 DIAGNOSIS — G8929 Other chronic pain: Secondary | ICD-10-CM

## 2016-07-20 DIAGNOSIS — M545 Low back pain: Principal | ICD-10-CM

## 2016-07-20 NOTE — Progress Notes (Signed)
Spoke to patient verbalized understanding that Mark Miranda placed a referral for his chronic low back pain. Patient was advised to call the office if further information is needed.

## 2016-07-20 NOTE — Progress Notes (Unsigned)
Referral to surgery has been placed by patient request for evaluation of chronic low back pain

## 2016-08-15 DIAGNOSIS — M47816 Spondylosis without myelopathy or radiculopathy, lumbar region: Secondary | ICD-10-CM | POA: Diagnosis not present

## 2016-08-15 DIAGNOSIS — M5416 Radiculopathy, lumbar region: Secondary | ICD-10-CM | POA: Diagnosis not present

## 2016-08-15 DIAGNOSIS — G894 Chronic pain syndrome: Secondary | ICD-10-CM | POA: Diagnosis not present

## 2016-08-16 DIAGNOSIS — Z940 Kidney transplant status: Secondary | ICD-10-CM | POA: Diagnosis not present

## 2016-08-16 DIAGNOSIS — N183 Chronic kidney disease, stage 3 (moderate): Secondary | ICD-10-CM | POA: Diagnosis not present

## 2016-08-16 DIAGNOSIS — N189 Chronic kidney disease, unspecified: Secondary | ICD-10-CM | POA: Diagnosis not present

## 2016-08-16 DIAGNOSIS — Z9483 Pancreas transplant status: Secondary | ICD-10-CM | POA: Diagnosis not present

## 2016-09-01 DIAGNOSIS — Z940 Kidney transplant status: Secondary | ICD-10-CM | POA: Diagnosis not present

## 2016-09-01 DIAGNOSIS — I10 Essential (primary) hypertension: Secondary | ICD-10-CM | POA: Diagnosis not present

## 2016-09-01 DIAGNOSIS — N182 Chronic kidney disease, stage 2 (mild): Secondary | ICD-10-CM | POA: Diagnosis not present

## 2016-09-01 DIAGNOSIS — D631 Anemia in chronic kidney disease: Secondary | ICD-10-CM | POA: Diagnosis not present

## 2016-09-12 DIAGNOSIS — M47816 Spondylosis without myelopathy or radiculopathy, lumbar region: Secondary | ICD-10-CM | POA: Diagnosis not present

## 2016-09-12 DIAGNOSIS — G894 Chronic pain syndrome: Secondary | ICD-10-CM | POA: Diagnosis not present

## 2016-09-12 DIAGNOSIS — M5416 Radiculopathy, lumbar region: Secondary | ICD-10-CM | POA: Diagnosis not present

## 2016-09-26 ENCOUNTER — Ambulatory Visit (INDEPENDENT_AMBULATORY_CARE_PROVIDER_SITE_OTHER): Payer: BLUE CROSS/BLUE SHIELD | Admitting: Specialist

## 2016-09-26 ENCOUNTER — Encounter (INDEPENDENT_AMBULATORY_CARE_PROVIDER_SITE_OTHER): Payer: Self-pay | Admitting: Specialist

## 2016-09-26 ENCOUNTER — Ambulatory Visit (INDEPENDENT_AMBULATORY_CARE_PROVIDER_SITE_OTHER): Payer: Self-pay

## 2016-09-26 VITALS — BP 119/69 | HR 72 | Ht 67.0 in | Wt 145.0 lb

## 2016-09-26 DIAGNOSIS — M5136 Other intervertebral disc degeneration, lumbar region: Secondary | ICD-10-CM

## 2016-09-26 DIAGNOSIS — M5442 Lumbago with sciatica, left side: Secondary | ICD-10-CM | POA: Diagnosis not present

## 2016-09-26 DIAGNOSIS — R5383 Other fatigue: Secondary | ICD-10-CM | POA: Diagnosis not present

## 2016-09-26 DIAGNOSIS — Z940 Kidney transplant status: Secondary | ICD-10-CM | POA: Diagnosis not present

## 2016-09-26 DIAGNOSIS — G8929 Other chronic pain: Secondary | ICD-10-CM | POA: Diagnosis not present

## 2016-09-26 DIAGNOSIS — M5441 Lumbago with sciatica, right side: Secondary | ICD-10-CM | POA: Diagnosis not present

## 2016-09-26 NOTE — Patient Instructions (Addendum)
Avoid frequent bending and stooping  No lifting greater than 10 lbs. May use ice or moist heat for pain. Weight loss is of benefit.  Schedule for ESIs at the L5-S1 level with Dr. Vear ClockPhillips,  series of 3 injections, one every 2 weeks.

## 2016-09-26 NOTE — Progress Notes (Addendum)
Office Visit Note   Patient: Mark Miranda           Date of Birth: 10-22-1970           MRN: [id number] Visit Date: 09/26/2016              Requested by: Roddie McJulia Kordsmeier, FNP [address] [place], [place] [id number] PCP: Inez CatalinaJulia Ann Kordsmeier, FNP   Assessment & Plan: Visit Diagnoses:  1. Chronic bilateral low back pain with bilateral sciatica   2. Other intervertebral disc degeneration, lumbar region   This  46 year old male has a long history of mechanical back pain worsened with sitting, bending, stooping and riding in vehicles. Improves with laying down. Able to walk but with discomfort. 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 Has tried light exercises he found on the web. [facility] previously [facility] by Dr. Vear ClockPhillips. Only one previous general? [facility]. Current meds include duloxetine, florinef, adderall, neurontine sertraline and oxymorphone. Do not recommend  NSAIDs with renal transplant. Will provide exercise manuals. MRI with DDD L5-S1 without modic changes, disc bulges diffusely with bilateral foramenal narrowing. High intensity zone mid portion of the posterior disc L5-S1. Will refer to Dr. 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 His pain pattern is consistent with mechanical back pain with disc injury, but he also describes a history of  Previous diabetic peripheral neuropathy so that there may be a component of chronic changes related to diabetes. He wants the back to be normal and is hoping for minimally invasive intervention. I spoke to [name] about surgery, a fused spine is not normal and impairs spine function by 20-25 %, healing may be delayed by the antirejection medications and risks of infection may also be increased. If we can  identify and relieve the pain preoperatively this would improve the chances of successful treatement and the conservative management may improve  Plan: Avoid frequent bending and stooping  No lifting greater than 10 lbs. May use ice or moist heat for pain. Weight loss is of benefit.  Schedule for ESIs at the L5-S1 level with Dr. Vear ClockPhillips,  series of 3 injections, one every 2 weeks.  Follow-Up Instructions: Return in about 4 weeks (around 10/24/2016).   Orders:  Orders Placed This Encounter  Procedures  . XR Lumbar Spine 2-3 Views   No orders of the defined types were placed in this encounter.     Procedures: No procedures performed   Clinical Data: No additional findings.   Subjective: Chief Complaint  Patient presents with  . Lower Back - Pain    [name] is her e for low back pain, he is new to Dr. Otelia SergeantNitka.  He has not had an injury to his back, but states that he did injure his right ankle 07/2015 and he has had pain since in his back and it goes down into both legs into the feet.  He states that he has been having worsening pain in the back.Pain is more in the legs than in the back, pressure pain in the back but the legs feel pain like diabetic nerve pain he has had in the past. No pain with cough or sneeze, no bowel or bladder difficulties. Would not try to walk a mile without a lot  of pain. Laying down helps to decrease the pain. Has tried some exercise    Review of Systems  Respiratory: Positive for cough and shortness of breath.   Musculoskeletal: Positive for back pain and gait problem.  Neurological: Positive for weakness and numbness.     Objective: Vital Signs: BP 119/69 (BP Location: Right Arm, Patient Position: Sitting)   Pulse 72   Ht 5\' 7"  (1.702 m)   Wt 145 lb (65.8 kg)   BMI 22.71 kg/m   Physical Exam  Constitutional: He is oriented to person, place, and time. He appears well-developed and well-nourished.  HENT:  Head: Normocephalic and  atraumatic.  Eyes: EOM are normal. Pupils are equal, round, and reactive to light.  Neck: Normal range of motion. Neck supple.  Pulmonary/Chest: Effort normal and breath sounds normal.  Abdominal: Soft. Bowel sounds are normal.  Musculoskeletal: Normal range of motion.  Neurological: He is alert and oriented to person, place, and time.  Skin: Skin is warm and dry.  Psychiatric: He has a normal mood and affect. His behavior is normal. Judgment and thought content normal.    Ortho Exam  Specialty Comments:  No specialty comments available.  Imaging: No results found.   PMFS History: Patient Active Problem List   Diagnosis Date Noted  . Low back pain 02/25/2016  . Chronic fatigue 01/25/2016  . Diabetic peripheral neuropathy (HCC) 11/24/2015  . 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  . Depression 11/02/2015  . Tobacco abuse 11/02/2015  . End stage renal disease (HCC) 12/29/2011  . HTN (hypertension) 01/25/2011   Past Medical History:  Diagnosis Date  . Depression   . Diabetes mellitus   . Diabetic peripheral neuropathy (HCC) 11/24/2015  . Hypercholesterolemia   . Hypertension   . Migraines   . Renal disorder     Family History  Problem Relation Age of Onset  . Diabetes Mother   . Hypertension Mother   . Hyperlipidemia Mother   . Cancer Father     glioblastoma    Past Surgical History:  Procedure Laterality Date  . AV FISTULA PLACEMENT  01/10/2012   Procedure: ARTERIOVENOUS (AV) FISTULA CREATION;  Surgeon: Sherren Kerns, MD;  Location: [facility] OR;  Service: Vascular;  Laterality: Left;  Left Brachial Cephalic Arteriovenous Fistula  . CAPD INSERTION  01/09/2012   Procedure: CONTINUOUS AMBULATORY PERITONEAL DIALYSIS  (CAPD) CATHETER INSERTION;  Surgeon: Ernestene Mention, MD;  Location: [facility] OR;  Service: General;  Laterality: N/A;  Exteriorization PD Cath  . INSERTION OF DIALYSIS CATHETER  01/13/2012   Procedure: INSERTION OF DIALYSIS CATHETER;   Surgeon: Larina Earthly, MD;  Location: [facility] OR;  Service: Vascular;  Laterality: Right;  Internal Jugular  . KIDNEY TRANSPLANT    . LAPAROSCOPY  03/13/2012   Procedure: LAPAROSCOPY DIAGNOSTIC;  Surgeon: Ardeth Sportsman, MD;  Location: [facility] OR;  Service: General;  Laterality: N/A;  . PERITONEAL CATHETER INSERTION     Social History   Occupational History  . [employer]    Social History Main Topics  . Smoking status: Current Every Day Smoker    Packs/day: 1.00    Years: 22.00    Types: Cigarettes  . Smokeless tobacco: Never Used  . Alcohol use 0.0 oz/week     Comment: socially  . Drug use: No  . Sexual activity: Not on file

## 2016-10-05 DIAGNOSIS — M47816 Spondylosis without myelopathy or radiculopathy, lumbar region: Secondary | ICD-10-CM | POA: Diagnosis not present

## 2016-10-05 DIAGNOSIS — G894 Chronic pain syndrome: Secondary | ICD-10-CM | POA: Diagnosis not present

## 2016-10-05 DIAGNOSIS — M5416 Radiculopathy, lumbar region: Secondary | ICD-10-CM | POA: Diagnosis not present

## 2016-11-02 DIAGNOSIS — M47816 Spondylosis without myelopathy or radiculopathy, lumbar region: Secondary | ICD-10-CM | POA: Diagnosis not present

## 2016-11-02 DIAGNOSIS — M5416 Radiculopathy, lumbar region: Secondary | ICD-10-CM | POA: Diagnosis not present

## 2016-11-02 DIAGNOSIS — G894 Chronic pain syndrome: Secondary | ICD-10-CM | POA: Diagnosis not present

## 2016-11-14 DIAGNOSIS — M5416 Radiculopathy, lumbar region: Secondary | ICD-10-CM | POA: Diagnosis not present

## 2016-11-14 DIAGNOSIS — M47816 Spondylosis without myelopathy or radiculopathy, lumbar region: Secondary | ICD-10-CM | POA: Diagnosis not present

## 2016-11-14 DIAGNOSIS — G894 Chronic pain syndrome: Secondary | ICD-10-CM | POA: Diagnosis not present

## 2016-11-18 ENCOUNTER — Ambulatory Visit (INDEPENDENT_AMBULATORY_CARE_PROVIDER_SITE_OTHER): Payer: BLUE CROSS/BLUE SHIELD | Admitting: Specialist

## 2016-11-18 ENCOUNTER — Encounter (INDEPENDENT_AMBULATORY_CARE_PROVIDER_SITE_OTHER): Payer: Self-pay | Admitting: Specialist

## 2016-11-18 VITALS — BP 133/77 | HR 77 | Ht 67.0 in | Wt 145.0 lb

## 2016-11-18 DIAGNOSIS — G8929 Other chronic pain: Secondary | ICD-10-CM | POA: Diagnosis not present

## 2016-11-18 DIAGNOSIS — M5442 Lumbago with sciatica, left side: Secondary | ICD-10-CM | POA: Diagnosis not present

## 2016-11-18 DIAGNOSIS — M5136 Other intervertebral disc degeneration, lumbar region: Secondary | ICD-10-CM | POA: Diagnosis not present

## 2016-11-18 DIAGNOSIS — M51369 Other intervertebral disc degeneration, lumbar region without mention of lumbar back pain or lower extremity pain: Secondary | ICD-10-CM

## 2016-11-18 NOTE — Patient Instructions (Addendum)
Avoid frequent bending and stooping  No lifting greater than 10 lbs. May use ice or moist heat for pain. Weight loss is of benefit. MRI ordered for assessing for a worsened HNP L5-S1.

## 2016-11-18 NOTE — Progress Notes (Signed)
Office Visit Note   Patient: Mark Miranda           Date of Birth: 1970-12-25           MRN: [id number] Visit Date: 11/18/2016              Requested by: Roddie Mc, FNP [address] [place], [place] [address] PCP: Inez Catalina, FNP   Assessment & Plan: Visit Diagnoses:  1. DDD (degenerative disc disease), lumbar   2. Chronic bilateral low back pain with left-sided sciatica     Plan:Avoid frequent bending and stooping  No lifting greater than 10 lbs. May use ice or moist heat for pain. Weight loss is of benefit. MRI ordered for assessing for a worsened HNP L5-S1.   Follow-Up Instructions: No Follow-up on file.   Orders:  No orders of the defined types were placed in this encounter.  No orders of the defined types were placed in this encounter.     Procedures: No procedures performed   Clinical Data: No additional findings.   Subjective: Chief Complaint  Patient presents with  . Lower Back - Follow-up    46 year old male with DDD with lumbago and sciatica, has history of kidney and pancreas transplant. Now s/p 2 ESI one last month and the 2nd 11/14/2016. ON a scale of one to ten Pain prior to ESIs was a "7" now it is about a 3 or 4. 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 Pain worsening with bending, walking and standing and stooping. He does manage to lift when he has to. Constipation but no incontinence no other bowel and bladder.    Review of Systems  Constitutional: Negative.   HENT: Negative.   Eyes: Negative.   Respiratory: Negative.   Cardiovascular: Negative.   Gastrointestinal: Negative.   Endocrine: Negative.   Genitourinary: Negative.   Musculoskeletal: Negative.   Skin: Negative.   Allergic/Immunologic: Negative.   Neurological: Negative.   Hematological: Negative.   Psychiatric/Behavioral: Negative.      Objective: Vital Signs: BP 133/77 (BP Location: Right Arm, Patient Position: Sitting)   Pulse 77   Ht 5'  7" (1.702 m)   Wt 145 lb (65.8 kg)   BMI 22.71 kg/m   Physical Exam  Constitutional: He is oriented to person, place, and time. He appears well-developed and well-nourished.  HENT:  Head: Normocephalic and atraumatic.  Eyes: EOM are normal. Pupils are equal, round, and reactive to light.  Neck: Normal range of motion. Neck supple.  Pulmonary/Chest: Effort normal and breath sounds normal.  Abdominal: Soft. Bowel sounds are normal.  Neurological: He is alert and oriented to person, place, and time.  Skin: Skin is warm and dry.  Psychiatric: He has a normal mood and affect. His behavior is normal. Judgment and thought content normal.    Back Exam   Tenderness  The patient is experiencing tenderness in the lumbar.  Range of Motion  Extension: abnormal  Lateral Bend Right: abnormal  Rotation Right: abnormal  Rotation Left: abnormal   Muscle Strength  Right Quadriceps:  5/5  Left Quadriceps:  5/5  Right Hamstrings:  5/5  Left Hamstrings:  5/5   Tests  Straight leg raise right: negative Straight leg raise left: negative  Reflexes  Patellar: normal Achilles: normal Babinski's sign: normal   Other  Toe Walk: normal Heel Walk: normal Sensation: normal Gait: normal  Erythema: no back redness Scars: absent      Specialty Comments:  No  specialty comments available.  Imaging: No results found.   PMFS History: Patient Active Problem List   Diagnosis Date Noted  . Low back pain 02/25/2016  . Chronic fatigue 01/25/2016  . Diabetic peripheral neuropathy (HCC) 11/24/2015  . 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  . Depression 11/02/2015  . Tobacco abuse 11/02/2015  . End stage renal disease (HCC) 12/29/2011  . HTN (hypertension) 01/25/2011   Past Medical History:  Diagnosis Date  . Depression   . Diabetes mellitus   . Diabetic peripheral neuropathy (HCC) 11/24/2015  . Hypercholesterolemia   . Hypertension   . Migraines     . Renal disorder     Family History  Problem Relation Age of Onset  . Diabetes Mother   . Hypertension Mother   . Hyperlipidemia Mother   . Cancer Father     glioblastoma    Past Surgical History:  Procedure Laterality Date  . AV FISTULA PLACEMENT  01/10/2012   Procedure: ARTERIOVENOUS (AV) FISTULA CREATION;  Surgeon: Sherren Kerns, MD;  Location: [facility] OR;  Service: Vascular;  Laterality: Left;  Left Brachial Cephalic Arteriovenous Fistula  . CAPD INSERTION  01/09/2012   Procedure: CONTINUOUS AMBULATORY PERITONEAL DIALYSIS  (CAPD) CATHETER INSERTION;  Surgeon: Ernestene Mention, MD;  Location: [facility] OR;  Service: General;  Laterality: N/A;  Exteriorization PD Cath  . INSERTION OF DIALYSIS CATHETER  01/13/2012   Procedure: INSERTION OF DIALYSIS CATHETER;  Surgeon: Larina Earthly, MD;  Location: [facility] OR;  Service: Vascular;  Laterality: Right;  Internal Jugular  . KIDNEY TRANSPLANT    . LAPAROSCOPY  03/13/2012   Procedure: LAPAROSCOPY DIAGNOSTIC;  Surgeon: Ardeth Sportsman, MD;  Location: [facility] OR;  Service: General;  Laterality: N/A;  . PERITONEAL CATHETER INSERTION     Social History   Occupational History  . [employer]    Social History Main Topics  . Smoking status: Current Every Day Smoker    Packs/day: 1.00    Years: 22.00    Types: Cigarettes  . Smokeless tobacco: Never Used  . Alcohol use 0.0 oz/week     Comment: socially  . Drug use: No  . Sexual activity: Not on file

## 2016-11-27 ENCOUNTER — Other Ambulatory Visit: Payer: Medicare Other

## 2016-12-11 ENCOUNTER — Ambulatory Visit
Admission: RE | Admit: 2016-12-11 | Discharge: 2016-12-11 | Disposition: A | Discharge status: Home / Self Care | Payer: BLUE CROSS/BLUE SHIELD | Source: Ambulatory Visit | Attending: Specialist | Admitting: Specialist

## 2016-12-11 DIAGNOSIS — M5126 Other intervertebral disc displacement, lumbar region: Secondary | ICD-10-CM | POA: Diagnosis not present

## 2016-12-11 DIAGNOSIS — M5442 Lumbago with sciatica, left side: Principal | ICD-10-CM

## 2016-12-11 DIAGNOSIS — G8929 Other chronic pain: Secondary | ICD-10-CM

## 2016-12-15 DIAGNOSIS — M5416 Radiculopathy, lumbar region: Secondary | ICD-10-CM | POA: Diagnosis not present

## 2016-12-15 DIAGNOSIS — Z79891 Long term (current) use of opiate analgesic: Secondary | ICD-10-CM | POA: Diagnosis not present

## 2016-12-15 DIAGNOSIS — M47816 Spondylosis without myelopathy or radiculopathy, lumbar region: Secondary | ICD-10-CM | POA: Diagnosis not present

## 2016-12-15 DIAGNOSIS — G894 Chronic pain syndrome: Secondary | ICD-10-CM | POA: Diagnosis not present

## 2016-12-21 NOTE — Progress Notes (Signed)
Has appt on 01/05/2017

## 2016-12-27 DIAGNOSIS — Z9483 Pancreas transplant status: Secondary | ICD-10-CM | POA: Diagnosis not present

## 2016-12-27 DIAGNOSIS — Z940 Kidney transplant status: Secondary | ICD-10-CM | POA: Diagnosis not present

## 2016-12-27 DIAGNOSIS — N189 Chronic kidney disease, unspecified: Secondary | ICD-10-CM | POA: Diagnosis not present

## 2016-12-27 LAB — BASIC METABOLIC PANEL
BUN: 19 mg/dL (ref 4–21)
Creatinine: 1 mg/dL (ref 0.6–1.3)
Glucose: 74 mg/dL
Potassium: 4.6 mmol/L (ref 3.4–5.3)
SODIUM: 139 mmol/L (ref 137–147)

## 2016-12-27 LAB — HEPATIC FUNCTION PANEL
ALK PHOS: 123 U/L (ref 25–125)
ALT: 9 U/L — AB (ref 10–40)
AST: 14 U/L (ref 14–40)
Bilirubin, Total: 0.3 mg/dL

## 2016-12-27 LAB — CBC AND DIFFERENTIAL
HCT: 45 % (ref 41–53)
Hemoglobin: 15.9 g/dL (ref 13.5–17.5)
Platelets: 250 10*3/uL (ref 150–399)
WBC: 12 10^3/mL

## 2016-12-30 DIAGNOSIS — E212 Other hyperparathyroidism: Secondary | ICD-10-CM | POA: Diagnosis not present

## 2016-12-30 DIAGNOSIS — I10 Essential (primary) hypertension: Secondary | ICD-10-CM | POA: Diagnosis not present

## 2016-12-30 DIAGNOSIS — D631 Anemia in chronic kidney disease: Secondary | ICD-10-CM | POA: Diagnosis not present

## 2016-12-30 DIAGNOSIS — N182 Chronic kidney disease, stage 2 (mild): Secondary | ICD-10-CM | POA: Diagnosis not present

## 2017-01-03 ENCOUNTER — Encounter: Payer: Self-pay | Admitting: Family Medicine

## 2017-01-05 ENCOUNTER — Encounter (INDEPENDENT_AMBULATORY_CARE_PROVIDER_SITE_OTHER): Payer: Self-pay | Admitting: Specialist

## 2017-01-05 ENCOUNTER — Ambulatory Visit (INDEPENDENT_AMBULATORY_CARE_PROVIDER_SITE_OTHER): Payer: BLUE CROSS/BLUE SHIELD | Admitting: Specialist

## 2017-01-05 VITALS — BP 135/69 | HR 71 | Ht 67.0 in | Wt 145.0 lb

## 2017-01-05 DIAGNOSIS — M48062 Spinal stenosis, lumbar region with neurogenic claudication: Secondary | ICD-10-CM

## 2017-01-05 DIAGNOSIS — N182 Chronic kidney disease, stage 2 (mild): Secondary | ICD-10-CM | POA: Diagnosis not present

## 2017-01-05 NOTE — Progress Notes (Signed)
Office Visit Note   Patient: Mark Miranda           Date of Birth: [birth year]           MRN: [id number] Visit Date: 01/05/2017              Requested by: Roddie Mc, FNP [address] [place], [place] [address] PCP: Roddie Mc, FNP   Assessment & Plan: Visit Diagnoses:  1. Spinal stenosis of lumbar region with neurogenic claudication     Plan: Avoid bending, stooping and avoid lifting weights greater than 10 lbs. Avoid prolong standing and walking.  Follow-Up Instructions: No Follow-up on file.   Orders:  No orders of the defined types were placed in this encounter.  No orders of the defined types were placed in this encounter.     Procedures: No procedures performed   Clinical Data: No additional findings.   Subjective: Chief Complaint  Patient presents with  . Lower Back - Follow-up    MRI Review---Lumbar    46 year old male with history of chronic back and leg pain bilaterally, he is currently in a pain management program, taking opana ER 20 mg q 12 hours. He has a history of renal and pancreas transplant performed at [facility]. He is now followed by Dr. Allena Katz at [facility]. He is unable to walk and grocery shop, throughout the day he is  Miserable. Narcotics do not make it better. Bowel and bladder are normal.     Review of Systems  Constitutional: Negative.   HENT: Negative.   Eyes: Negative.   Respiratory: Negative.   Cardiovascular: Negative.   Gastrointestinal: Negative.   Endocrine: Negative.   Genitourinary: Negative.   Musculoskeletal: Negative.   Skin: Negative.   Allergic/Immunologic: Negative.   Neurological: Negative.   Hematological: Negative.   Psychiatric/Behavioral: Negative.      Objective: Vital Signs: BP 135/69 (BP Location: Left Arm, Patient Position: Sitting)   Pulse 71   Ht 5\' 7"  (1.702 m)   Wt 145 lb (65.8 kg)   BMI 22.71 kg/m   Physical Exam    Constitutional: He is oriented to person, place, and time. He appears well-developed and well-nourished.  HENT:  Head: Normocephalic and atraumatic.  Eyes: EOM are normal. Pupils are equal, round, and reactive to light.  Neck: Normal range of motion. Neck supple.  Pulmonary/Chest: Effort normal and breath sounds normal.  Abdominal: Soft. Bowel sounds are normal.  Neurological: He is alert and oriented to person, place, and time.  Skin: Skin is warm and dry.  Psychiatric: He has a normal mood and affect. His behavior is normal. Judgment and thought content normal.    Back Exam   Tenderness  The patient is experiencing tenderness in the lumbar.  Range of Motion  Extension: abnormal  Flexion: normal  Lateral Bend Right: normal  Lateral Bend Left: normal  Rotation Right: normal  Rotation Left: normal   Muscle Strength  Right Quadriceps:  4/5  Left Quadriceps:  4/5  Right Hamstrings:  4/5  Left Hamstrings:  4/5   Tests  Straight leg raise right: negative Straight leg raise left: negative  Reflexes  Patellar: normal Achilles: normal Babinski's sign: normal   Other  Toe Walk: normal Heel Walk: normal Sensation: normal Gait: normal   Comments:  Bilateral EHL weakness 4/5,  SLR neg bilateral.      Specialty Comments:  No specialty comments available.  Imaging: No results found.  PMFS History: Patient Active Problem List   Diagnosis Date Noted  . Low back pain 02/25/2016  . Chronic fatigue 01/25/2016  . Diabetic peripheral neuropathy (HCC) 11/24/2015  . 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  . Depression 11/02/2015  . Tobacco abuse 11/02/2015  . End stage renal disease (HCC) 12/29/2011  . HTN (hypertension) 01/25/2011   Past Medical History:  Diagnosis Date  . Depression   . Diabetes mellitus   . Diabetic peripheral neuropathy (HCC) 11/24/2015  . Hypercholesterolemia   . Hypertension   . Migraines   . Renal  disorder     Family History  Problem Relation Age of Onset  . Diabetes Mother   . Hypertension Mother   . Hyperlipidemia Mother   . Cancer Father        glioblastoma    Past Surgical History:  Procedure Laterality Date  . AV FISTULA PLACEMENT  01/10/2012   Procedure: ARTERIOVENOUS (AV) FISTULA CREATION;  Surgeon: Sherren Kernsharles E Fields, MD;  [facility];  Service: Vascular;  Laterality: Left;  Left Brachial Cephalic Arteriovenous Fistula  . CAPD INSERTION  01/09/2012   Procedure: CONTINUOUS AMBULATORY PERITONEAL DIALYSIS  (CAPD) CATHETER INSERTION;  Surgeon: Ernestene MentionHaywood M Ingram, MD;  Location: [facility] OR;  Service: General;  Laterality: N/A;  Exteriorization PD Cath  . INSERTION OF DIALYSIS CATHETER  01/13/2012   Procedure: INSERTION OF DIALYSIS CATHETER;  Surgeon: Larina Earthlyodd F Early, MD;  [facility];  Service: Vascular;  Laterality: Right;  Internal Jugular  . KIDNEY TRANSPLANT    . LAPAROSCOPY  03/13/2012   Procedure: LAPAROSCOPY DIAGNOSTIC;  Surgeon: Ardeth SportsmanSteven C. Gross, MD;  Location: [facility] OR;  Service: General;  Laterality: N/A;  . 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 Smoking status: Current Every Day Smoker    Packs/day: 1.00    Years: 22.00    Types: Cigarettes  . Smokeless tobacco: Never Used  . Alcohol use 0.0 oz/week     Comment: socially  . Drug use: No  . Sexual activity: Not on file

## 2017-01-05 NOTE — Patient Instructions (Addendum)
Avoid bending, stooping and avoid lifting weights greater than 10 lbs. Avoid prolong standing and walking.

## 2017-01-13 DIAGNOSIS — M5416 Radiculopathy, lumbar region: Secondary | ICD-10-CM | POA: Diagnosis not present

## 2017-01-13 DIAGNOSIS — M47816 Spondylosis without myelopathy or radiculopathy, lumbar region: Secondary | ICD-10-CM | POA: Diagnosis not present

## 2017-01-13 DIAGNOSIS — G894 Chronic pain syndrome: Secondary | ICD-10-CM | POA: Diagnosis not present

## 2017-01-17 ENCOUNTER — Ambulatory Visit (INDEPENDENT_AMBULATORY_CARE_PROVIDER_SITE_OTHER): Payer: BLUE CROSS/BLUE SHIELD | Admitting: Family Medicine

## 2017-01-17 ENCOUNTER — Encounter: Payer: Self-pay | Admitting: Family Medicine

## 2017-01-17 VITALS — BP 112/70 | HR 71 | Resp 12 | Ht 67.0 in | Wt 146.4 lb

## 2017-01-17 DIAGNOSIS — R5382 Chronic fatigue, unspecified: Secondary | ICD-10-CM | POA: Diagnosis not present

## 2017-01-17 DIAGNOSIS — M5442 Lumbago with sciatica, left side: Secondary | ICD-10-CM

## 2017-01-17 DIAGNOSIS — E1142 Type 2 diabetes mellitus with diabetic polyneuropathy: Secondary | ICD-10-CM | POA: Diagnosis not present

## 2017-01-17 DIAGNOSIS — F17200 Nicotine dependence, unspecified, uncomplicated: Secondary | ICD-10-CM | POA: Diagnosis not present

## 2017-01-17 DIAGNOSIS — G8929 Other chronic pain: Secondary | ICD-10-CM

## 2017-01-17 DIAGNOSIS — M5441 Lumbago with sciatica, right side: Secondary | ICD-10-CM

## 2017-01-17 MED ORDER — VARENICLINE TARTRATE 1 MG PO TABS: 1.0000 mg | ORAL_TABLET | Freq: Two times a day (BID) | ORAL | 1 refills | Status: DC

## 2017-01-17 MED ORDER — VARENICLINE TARTRATE 0.5 MG X 11 & 1 MG X 42 PO MISC: ORAL | 0 refills | Status: AC

## 2017-01-17 NOTE — Patient Instructions (Signed)
  Mr.Prentis Peppers I have seen you today for an acute visit.  A few things to remember from today's visit:   Tobacco use disorder - Plan: varenicline (CHANTIX STARTING MONTH PAK) 0.5 MG X 11 & 1 MG X 42 tablet, varenicline (CHANTIX CONTINUING MONTH PAK) 1 MG tablet  Diabetic peripheral neuropathy (HCC)  Chronic fatigue  Chronic bilateral low back pain with bilateral sciatica    Recommend resuming Gabapentin for neuropathic pain. Ask you back specialists about steam cells treatment you are interested on.  Some of your meds can cause fatigue.  Monitor for worsening depression or fatigue among some while on Chantix. Make your nephrologists aware of this new medication.     Medications prescribed today are intended for short period of time and will not be refill upon request, a follow up appointment in 4-6 weeks with your doctor recommended.     Please be sure you have an appointment already scheduled with your PCP before you leave today.

## 2017-01-17 NOTE — Progress Notes (Signed)
HPI:   ACUTE VISIT:  Chief Complaint  Patient presents with  . Nicotine Dependence    [name] is a 46 y.o. male, who is here today because he is interested in smoking cessation.  S/P kidney/pancreas transplant in 2014, he has tried to quit smoking since then but unsuccessfully. He did not feel like Wellbutrin or Nicotine helped. He took Chantix, which seemed to help the most but he did not complete treatment.  Hx of depression and anxiety, he is not longer taking Zoloft. He denies any suicidal thoughts or Hx of suicidal attempt.   Nicotine Dependence  Presents for initial visit. Symptoms include fatigue. Symptoms are negative for sore throat. Preferred cigarette types include filtered. His urge triggers include company of smokers and stress. He smokes 1 pack of cigarettes per day. He started smoking when he was >19 years old. Past treatments include nicotine patch, varenicline and buproprion. The treatment provided minimal relief. Compliance with prior treatments has been poor. Mark Miranda is thinking about quitting. Mark Miranda has tried to quit 3 times. There is no history of drug use.   He is also complaining about throat pain, fatigue, back pain among some.  2-3 days of tenderness on anterior aspect of neck, he has not noted any mass or erythema, he is concerned about "cancer." He is requesting "syrup" medication to treat symptom. He denies chills, fever, sore throat, dysphagia, or abnormal weight loss. No sick contact or recent travel.  Occasional cough, he denies hemoptysis.  He also states that he feels frustrated due to constant pain. He has Hx of chronic pain, on Opana ER and follows with pain clinic.  + Feet burnging pain, Hx of neuropathy, he was hoping that symptoms will resolve after renal transplant. He discontinued Gabapentin because he did not noted major benefit.  DM I, he is not longer requiring insulin and not longer following with  endocrinologists.  He is also requesting referral to provider that does "steam cells" for treatment of back pain. According to pt, surgery has been recommend but he knows it will not solve problem.  He follows with Dr Anne Hahn, neuro.  Fatigue: He states that he is sleeping better, he is taking OTC sleeping aid. He was taking Trazodone but he didn't notice major difference in sleep. He does not feel rested when he first wakes Up. He denies loud snoring or witnessed apnea, he denies falling asleep while driving.  He tells me that he has ordered Modafinil on line from [place] and taking medication daily, it helps some.     Review of Systems  Constitutional: Positive for fatigue. Negative for activity change, appetite change and fever.  HENT: Positive for postnasal drip. Negative for mouth sores, sore throat and trouble swallowing.   Eyes: Negative for redness and visual disturbance.  Respiratory: Positive for cough. Negative for shortness of breath and wheezing.   Cardiovascular: Negative for chest pain, palpitations and leg swelling.  Gastrointestinal: Negative for abdominal pain, nausea and vomiting.       No changes in bowel habits.  Endocrine: Negative for cold intolerance, heat intolerance, polydipsia, polyphagia and polyuria.  Genitourinary: Negative for decreased urine volume and hematuria.  Musculoskeletal: Positive for back pain and neck pain. Negative for gait problem.  Skin: Negative for pallor and rash.  Neurological: Negative for syncope, weakness and headaches.  Hematological: Negative for adenopathy. Does not bruise/bleed easily.  Psychiatric/Behavioral: Positive for sleep disturbance. Negative for confusion and suicidal ideas. The patient is nervous/anxious.  Current Outpatient Prescriptions on File Prior to Visit  Medication Sig Dispense Refill  . lisinopril (PRINIVIL,ZESTRIL) 5 MG tablet Take 5 mg by mouth daily.    . mycophenolate (MYFORTIC) 180 MG EC tablet Take  180 mg by mouth 2 (two) times daily.    Marland Kitchen oxymorphone (OPANA ER) 20 MG 12 hr tablet 20 mg.  0  . sertraline (ZOLOFT) 50 MG tablet Take 1 tablet (50 mg total) by mouth daily. 30 tablet 5  . sulfamethoxazole-trimethoprim (BACTRIM,SEPTRA) 400-80 MG tablet Take 1 tablet by mouth 3 (three) times a week.    . tacrolimus (PROGRAF) 1 MG capsule Take 1 mg by mouth. Pt take 5 in the morning and 4 at night    . traZODone (DESYREL) 50 MG tablet 50 mg.  0  . valGANciclovir (VALCYTE) 450 MG tablet Take 450 mg by mouth daily. Reported on 12/22/2015    . Vitamin D, Ergocalciferol, (DRISDOL) 50000 units CAPS capsule Take 50,000 Units by mouth every 7 (seven) days.    Marland Kitchen gabapentin (NEURONTIN) 300 MG capsule One capsule in the morning and two in the evening (Patient not taking: Reported on 01/05/2017) 90 capsule 3  . [DISCONTINUED] diltiazem (CARDIZEM) 30 MG tablet Take 30 mg by mouth 4 (four) times daily. Ask patient to verify dosage and frequency. Not indicated on medical history form dated 10/23/10.     . [DISCONTINUED] insulin lispro (HUMALOG) 100 UNIT/ML injection Inject into the skin 3 (three) times daily before meals. Ask patient to verify name/dosage/. History form noted dosage of 34 units during day and 22 units at night.      No current facility-administered medications on file prior to visit.      Past Medical History:  Diagnosis Date  . Depression   . Diabetes mellitus   . Diabetic peripheral neuropathy (HCC) 11/24/2015  . Hypercholesterolemia   . Hypertension   . Migraines   . Renal disorder    No Known Allergies  Social History   Social History  . Marital status: Married    Spouse name: N/A  . Number of children: 4  . Years of education: 10   Occupational History  . [employer]    Social History Main Topics  . Smoking status: Current Every Day Smoker    Packs/day: 1.00    Years: 22.00    Types: Cigarettes  . Smokeless tobacco: Never Used  . Alcohol use 0.0 oz/week     Comment:  socially  . Drug use: No  . Sexual activity: Not Asked   Other Topics Concern  . None   Social History Narrative   Paints houses   Wife works at a Retail banker at [employer]   4 children age 65 to 68   Regular exercise-no current exercise regimen   Caffeine Use-no    Vitals:   01/17/17 1458  BP: 112/70  Pulse: 71  Resp: 12  O2 sat at RA 97% Body mass index is 22.93 kg/m.   Physical Exam  Nursing note and vitals reviewed. Constitutional: He is oriented to person, place, and time. He appears well-developed and well-nourished. No distress.  HENT:  Head: Atraumatic.  Mouth/Throat: Oropharynx is clear and moist and mucous membranes are normal.  Eyes: Conjunctivae and EOM are normal. Pupils are equal, round, and reactive to light.  Neck: No muscular tenderness present. No tracheal deviation, no edema and no erythema present. No thyroid mass and no thyromegaly present.  Cardiovascular: Normal rate and regular rhythm.   Murmur (? soft SEM  RUSB) heard. Pulses:      Dorsalis pedis pulses are 2+ on the right side, and 2+ on the left side.  Respiratory: Effort normal and breath sounds normal. No respiratory distress.  Lymphadenopathy:       Head (right side): No submandibular adenopathy present.       Head (left side): No submandibular adenopathy present.    He has no cervical adenopathy.  Neurological: He is alert and oriented to person, place, and time. He has normal strength. Gait normal.  Skin: Skin is warm. No rash noted. No erythema.  Psychiatric: His mood appears anxious. He expresses no suicidal ideation. He expresses no suicidal plans.  Well groomed, good eye contact.     ASSESSMENT AND PLAN:   Mark Miranda was seen today for nicotine dependence.  Diagnoses and all orders for this visit:  Tobacco use disorder  We discussed adverse effects of tobacco use as well as benefits of smoking cessation. We reviewed treatment options and some side effects, he still wants to try  Chantix again. He understands that Chantix could aggravate depression symptoms and there have been some reports about suicidal thoughts. Althought not contraindicated in depressed pt but we need to be cautious, specially if depression is not under controlled. He denies depressed mood.  -     varenicline (CHANTIX STARTING MONTH PAK) 0.5 MG X 11 & 1 MG X 42 tablet; Take one 0.5 mg tablet by mouth once daily for 3 days, then increase to one 0.5 mg tablet twice daily for 4 days, then increase to one 1 mg tablet twice daily. -     varenicline (CHANTIX CONTINUING MONTH PAK) 1 MG tablet; Take 1 tablet (1 mg total) by mouth 2 (two) times daily. Start 01/24/17 (after completing starting pack)  Diabetic peripheral neuropathy (HCC)  Educated about Dx and prognosis, resulted from chronic damage before his DM was better controlled. Gabapentin did not help as he reported, he could try again, lower dose and with caution since this med also could aggravate depression. Other options as Cymbalta or Amitriptyline could be considered. Recommend following with PCP and neuro.  Chronic fatigue  Possible etiologies discussed: Chronic diseases,derpession,sleep disorders,and medications among some. Strongly discouraged from taking meds that are not prescribed by a provider [place] in [place]. Also educated about current recommendations in regard to controlled medications.   Chronic bilateral low back pain with bilateral sciatica  Explained that I am not aware of any new treatment for chronic back pain that involves "steam cells.This is something he may want to discuss with his neurologists and/or pain clinic provider.    3:02 pm - 3:32 pm face to face OV. > 50% was dedicated to discussion of Dx, prognosis, treatment options, and some side effects of medications. Advised to be cautious with medications ordered without Rx and from [place], he also most likely has a medication contract and given the fact that Modafinil is  controlled medication ,this could be problematic. Extra time was needed for counseling due to pt's anxiety.   -[name] was advised to seek immediate medical attention is symptoms suddenly get worse otherwise follow with PCP in 6 weeks.      [name]. [place]Jordan, MD  [facility]. [place] office.

## 2017-02-10 DIAGNOSIS — G894 Chronic pain syndrome: Secondary | ICD-10-CM | POA: Diagnosis not present

## 2017-02-10 DIAGNOSIS — M5416 Radiculopathy, lumbar region: Secondary | ICD-10-CM | POA: Diagnosis not present

## 2017-02-10 DIAGNOSIS — M47816 Spondylosis without myelopathy or radiculopathy, lumbar region: Secondary | ICD-10-CM | POA: Diagnosis not present

## 2017-02-12 ENCOUNTER — Other Ambulatory Visit: Payer: Self-pay | Admitting: Family Medicine

## 2017-02-15 NOTE — Telephone Encounter (Signed)
Pt was last seen by Dr Swazil[place]Jordan on 01/17/2017 and was started on Chantix , pt is requesting for refills. Please Advise if ok to refill.

## 2017-02-16 NOTE — Telephone Encounter (Signed)
Rx has been sent to pharmacy, pt is  aware

## 2017-02-16 NOTE — Telephone Encounter (Signed)
Rx for chantix 1 mg was e scribed to pt pharmacy

## 2017-02-16 NOTE — Telephone Encounter (Signed)
Please refill for continuing pack of 1 mg BID; follow up as scheduled.

## 2017-02-16 NOTE — Telephone Encounter (Signed)
Spoke with patient stated that he is done taking chantix starter pack and currently has been taking Chantix 1 mg twice daily. Pt has an follow up appointment scheduled for 02/23/2017 at 10am.

## 2017-02-23 ENCOUNTER — Other Ambulatory Visit: Payer: Self-pay | Admitting: Neurology

## 2017-02-23 ENCOUNTER — Ambulatory Visit (INDEPENDENT_AMBULATORY_CARE_PROVIDER_SITE_OTHER): Payer: BLUE CROSS/BLUE SHIELD | Admitting: Family Medicine

## 2017-02-23 ENCOUNTER — Encounter: Payer: Self-pay | Admitting: Family Medicine

## 2017-02-23 ENCOUNTER — Ambulatory Visit: Payer: BLUE CROSS/BLUE SHIELD | Admitting: Family Medicine

## 2017-02-23 VITALS — BP 142/76 | HR 66 | Temp 98.2°F | Resp 12 | Ht 67.0 in | Wt 146.0 lb

## 2017-02-23 DIAGNOSIS — G8929 Other chronic pain: Secondary | ICD-10-CM

## 2017-02-23 DIAGNOSIS — F17200 Nicotine dependence, unspecified, uncomplicated: Secondary | ICD-10-CM | POA: Diagnosis not present

## 2017-02-23 DIAGNOSIS — M5441 Lumbago with sciatica, right side: Secondary | ICD-10-CM

## 2017-02-23 DIAGNOSIS — F329 Major depressive disorder, single episode, unspecified: Secondary | ICD-10-CM

## 2017-02-23 DIAGNOSIS — M5442 Lumbago with sciatica, left side: Secondary | ICD-10-CM | POA: Diagnosis not present

## 2017-02-23 DIAGNOSIS — F32A Depression, unspecified: Secondary | ICD-10-CM

## 2017-02-23 MED ORDER — BUPROPION HCL ER (SR) 150 MG PO TB12: 150.0000 mg | ORAL_TABLET | Freq: Two times a day (BID) | ORAL | 1 refills | Status: DC

## 2017-02-23 NOTE — Patient Instructions (Addendum)
Please stop Chantix and call [place] [facility]uit [facility] 1-800-QUIT NOW ([phone]) for resources and nicotine patched.  Please take wellbutrin as directed and follow up in 2 months for evaluation of this medication.   Consider counseling in addition to medications which can be of great benefit to you.   Major Depressive Disorder, Adult Major depressive disorder (MDD) is a mental health condition. MDD often makes you feel sad, hopeless, or helpless. MDD can also cause symptoms in your body. MDD can affect your:  Work.  School.  Relationships.  Other normal activities.  MDD can range from mild to very bad. It may occur once (single episode MDD). It can also occur many times (recurrent MDD). The main symptoms of MDD often include:  Feeling sad, depressed, or irritable most of the time.  Loss of interest.  MDD symptoms also include:  Sleeping too much or too little.  Eating too much or too little.  A change in your weight.  Feeling tired (fatigue) or having low energy.  Feeling worthless.  Feeling guilty.  Trouble making decisions.  Trouble thinking clearly.  Thoughts of suicide or harming others.  Feeling weak.  Feeling agitated.  Keeping yourself from being around other people (isolation).  Follow these instructions at home: Activity  Do these things as told by your doctor: ? Go back to your normal activities. ? Exercise regularly. ? Spend time outdoors. Alcohol  Talk with your doctor about how alcohol can affect your antidepressant medicines.  Do not drink alcohol. Or, limit how much alcohol you drink. ? This means no more than 1 drink a day for nonpregnant women and 2 drinks a day for men. One drink equals one of these:  12 oz of beer.  5 oz of wine.  1 oz of hard liquor. General instructions  Take over-the-counter and prescription medicines only as told by your doctor.  Eat a healthy diet.  Get plenty of sleep.  Find activities that you enjoy.  Make time to do them.  Think about joining a support group. Your doctor may be able to suggest a group for you.  Keep all follow-up visits as told by your doctor. This is important. Where to find more information:  [employer] Mental Illness: ? www.nami.org  [employer] of Mental Health: ? http://www.maynard.net/www.nimh.nih.gov  National Suicide Prevention Lifeline: ? 819-818-37081-507-014-7531. This is free, 24-hour help. Contact a doctor if:  Your symptoms get worse.  You have new symptoms. Get help right away if:  You self-harm.  You see, hear, taste, smell, or feel things that are not present (hallucinate). If you ever feel like you may hurt yourself or others, or have thoughts about taking your own life, get help right away. You can go to your nearest emergency department or call:  Your local emergency services (911 in the [place]  A suicide crisis helpline, such as the National Suicide Prevention Lifeline: ? 778-152-56151-507-014-7531. This is open 24 hours a day.  This information is not intended to replace advice given to you by your health care provider. Make sure you discuss any questions you have with your health care provider. Document Released: 07/06/2015 Document Revised: 04/10/2016 Document Reviewed: 04/10/2016 Elsevier Interactive Patient Education  2017 [employer]

## 2017-02-23 NOTE — Progress Notes (Signed)
Subjective:    Patient ID: Mark Miranda, male    DOB: 11-25-1970, 46 y.o.   MRN: [id number]  HPI  Mark Miranda is a 46 year old male who presents today for follow up of his Chantix, depression, and chronic back pain.   Chantix has provided benefit for patient to decrease smoking however he reports that this decrease is approximately 0.5 pack per day. He reports that he has had wellbutrin with nicotine before and while he has reported these did not help, he states that he did not take them as prescribed.  He reports depression that is his greatest concern today.  He denies suicidal/homicidal ideation. He does report associated loss of interest/pleasure and feeling depressed as he has not been adherent to his prior anti depression medication. Symptoms have been more noticeable after his wife recently lost her job.   Depression has been present and as stated he has not been adherent to medication and does not provide a clear history of effectiveness with sertraline, cymbalta, or wellbutrin today.  He has experienced anxiety however denies that anxiety is an issue at this time.   [facility] 01/25/2016 11/23/2015 11/02/2015 07/17/2012  Decreased Interest 0 1 3 0  Down, Depressed, Hopeless 1 1 3  0  PHQ - 2 Score 1 2 6  0  Altered sleeping - 0 0 -  Tired, decreased energy - 1 2 -  Change in appetite - 0 0 -  Feeling bad or failure about yourself  - 1 2 -  Trouble concentrating - 0 0 -  Moving slowly or fidgety/restless - 0 0 -  Suicidal thoughts - 0 0 -  PHQ-9 Score - 4 10 -  Difficult doing work/chores - - Somewhat difficult -   He is followed by pain management for back pain and orthopedic surgery for evaluation of back pain and treatment.  He is taking opana ER 20 mg every 12 hours which provides moderate benefit for him. He denies any change in chronic back pain today and states this is not a problem today for him. He has a history of spinal stenosis and degenerative disc  disease where obtained MRI revealed the following: MRI on 12/11/16 noted L5-S1 chronic disc protrusions with biforaminal L5 impingement; degenerative changes or impingement at the other levels, and transitional S1 vertebra. Following this imaging he was recommended for fusion per patient. He is considering option but is concerned about length of time he would need to be off work and does not feel this is an option at this time.   Review of Systems  Constitutional: Negative for chills, fatigue and fever.  Respiratory: Negative for cough, shortness of breath and wheezing.   Cardiovascular: Negative for chest pain and palpitations.  Gastrointestinal: Negative for abdominal pain, diarrhea, nausea and vomiting.  Musculoskeletal: Positive for back pain.  Skin: Negative for rash.  Neurological: Negative for dizziness, light-headedness, numbness and headaches.  Psychiatric/Behavioral: Positive for sleep disturbance.       Depression; denies anxiety or being anxious today   Past Medical History:  Diagnosis Date  . Depression   . Diabetes mellitus   . Diabetic peripheral neuropathy (HCC) 11/24/2015  . Hypercholesterolemia   . Hypertension   . Migraines   . Renal disorder      Social History   Social History  . Marital status: Married    Spouse name: N/A  . Number of children: 4  . Years of education: 10   Occupational History  .  [employer]    Social History Main Topics  . Smoking status: Current Every Day Smoker    Packs/day: 1.00    Years: 22.00    Types: Cigarettes  . Smokeless tobacco: Never Used  . Alcohol use 0.0 oz/week     Comment: socially  . Drug use: No  . Sexual activity: Not on file   Other Topics Concern  . Not on file   Social History Narrative   Paints houses   Wife works at a Retail banker at [employer]   4 children age 3 to 53   Regular exercise-no current exercise regimen   Caffeine Use-no    Past Surgical History:  Procedure Laterality Date  . AV FISTULA  PLACEMENT  01/10/2012   Procedure: ARTERIOVENOUS (AV) FISTULA CREATION;  Surgeon: Sherren Kerns, MD;  Location: [facility] OR;  Service: Vascular;  Laterality: Left;  Left Brachial Cephalic Arteriovenous Fistula  . CAPD INSERTION  01/09/2012   Procedure: CONTINUOUS AMBULATORY PERITONEAL DIALYSIS  (CAPD) CATHETER INSERTION;  Surgeon: Ernestene Mention, MD;  Location: [facility] OR;  Service: General;  Laterality: N/A;  Exteriorization PD Cath  . INSERTION OF DIALYSIS CATHETER  01/13/2012   Procedure: INSERTION OF DIALYSIS CATHETER;  Surgeon: Larina Earthly, MD;  Location[facility] OR;  Service: Vascular;  Laterality: Right;  Internal Jugular  . KIDNEY TRANSPLANT    . LAPAROSCOPY  03/13/2012   Procedure: LAPAROSCOPY DIAGNOSTIC;  Surgeon: Ardeth Sportsman, MD;  Location: [facility] OR;  Service: General;  Laterality: N/A;  . PERITONEAL CATHETER INSERTION      Family History  Problem Relation Age of Onset  . Diabetes Mother   . Hypertension Mother   . Hyperlipidemia Mother   . Cancer Father        glioblastoma    No Known Allergies  Current Outpatient Prescriptions on File Prior to Visit  Medication Sig Dispense Refill  . CHANTIX STARTING MONTH PAK 0.5 MG X 11 & 1 MG X 42 tablet TAKE 1(0.5MG ) TAB BY MOUTH ONCE DAILY FOR 3DAYS, TAKE 0.5MG TAB TWICE 4 DAYS, TAKE 1TAB TWICE DAILY 53 tablet 1  . lisinopril (PRINIVIL,ZESTRIL) 5 MG tablet Take 5 mg by mouth daily.    . mycophenolate (MYFORTIC) 180 MG EC tablet Take 180 mg by mouth 2 (two) times daily.    Marland Kitchen oxymorphone (OPANA ER) 20 MG 12 hr tablet 20 mg.  0  . sertraline (ZOLOFT) 50 MG tablet Take 1 tablet (50 mg total) by mouth daily. 30 tablet 5  . sulfamethoxazole-trimethoprim (BACTRIM,SEPTRA) 400-80 MG tablet Take 1 tablet by mouth 3 (three) times a week.    . tacrolimus (PROGRAF) 1 MG capsule Take 1 mg by mouth. Pt take 5 in the morning and 4 at night    . traZODone (DESYREL) 50 MG tablet 50 mg.  0  . valGANciclovir (VALCYTE) 450 MG tablet Take 450 mg by mouth daily. Reported  on 12/22/2015    . varenicline (CHANTIX CONTINUING MONTH PAK) 1 MG tablet Take 1 tablet (1 mg total) by mouth 2 (two) times daily. Start 01/24/17 (after completing starting pack) 60 tablet 1  . gabapentin (NEURONTIN) 300 MG capsule One capsule in the morning and two in the evening (Patient not taking: Reported on 02/23/2017) 90 capsule 3  . Vitamin D, Ergocalciferol, (DRISDOL) 50000 units CAPS capsule Take 50,000 Units by mouth every 7 (seven) days.    . [DISCONTINUED] diltiazem (CARDIZEM) 30 MG tablet Take 30 mg by mouth 4 (four) times daily. Ask patient to verify dosage  and frequency. Not indicated on medical history form dated 10/23/10.     . [DISCONTINUED] insulin lispro (HUMALOG) 100 UNIT/ML injection Inject into the skin 3 (three) times daily before meals. Ask patient to verify name/dosage/. History form noted dosage of 34 units during day and 22 units at night.      No current facility-administered medications on file prior to visit.     BP (!) 142/76 (BP Location: Left Arm, Patient Position: Sitting, Cuff Size: Normal)   Pulse 66   Temp 98.2 F (36.8 C) (Oral)   Resp 12   Ht 5\' 7"  (1.702 m)   Wt 146 lb (66.2 kg)   SpO2 99%   BMI 22.87 kg/m        Objective:   Physical Exam  Constitutional: He is oriented to person, place, and time. He appears well-developed and well-nourished.  Eyes: Pupils are equal, round, and reactive to light. No scleral icterus.  Neck: Neck supple.  Cardiovascular: Normal rate, regular rhythm and intact distal pulses.   Pulmonary/Chest: Effort normal and breath sounds normal. He has no wheezes. He has no rales.  Abdominal: Soft. Bowel sounds are normal. There is no tenderness. There is no rebound.  Musculoskeletal: He exhibits no edema.  Spine with normal alignment and no deformity. No tenderness to vertebral process with palpation and no paraspinous muscle tenderness present. ROM is full at lumbar sacral regions. Negative Straight Leg raise. No CVA  tenderness present. Able to heel/toe walk without pain.  Lymphadenopathy:    He has no cervical adenopathy.  Neurological: He is alert and oriented to person, place, and time. Gait normal.  Reflex Scores:      Patellar reflexes are 2+ on the right side and 2+ on the left side. Skin: Skin is warm and dry. No rash noted.  Psychiatric: He has a normal mood and affect. His behavior is normal. Judgment and thought content normal.       Assessment & Plan:  1. Depression, unspecified depression type Worsening; PHQ-9: Score: 6 indicating mild depression; he reports that he has not been adherent to his medication and is interested in starting a medication for depression today. Reviewed options and bupropion has been chosen as he is interested in smoking cessation, denies anxiety today and would like to see if this will help.  Recommended counseling in addition to medication and also advised him to follow up in 2 months for evaluation of his depression and initiation of medication.  - buPROPion (WELLBUTRIN SR) 150 MG 12 hr tablet; Take 1 tablet (150 mg total) by mouth 2 (two) times daily.  Dispense: 30 tablet; Refill: 1  2. Tobacco use disorder He has cut back smoking on Chantix however with worsening depression; it is challenging to determine if worsening depression is related to nonadherence to prior medications for depression as he has not provided a clear history of when he stopped taking any medication for depression; advised patient to discontinue this medication and initiate a trial of nicotine patches will be used with initiation of wellbutrin. He is motivated to try both wellbutrin and nicotine patch. Provided [place] Quit line information to obtain assistance for nicotine patches.  3. Chronic bilateral low back pain with bilateral sciatica Stable; follow up with orthopedics and pain management for treatment.  Follow up in 2 months or sooner if needed.  Roddie Mc, FNP-C

## 2017-03-14 DIAGNOSIS — G894 Chronic pain syndrome: Secondary | ICD-10-CM | POA: Diagnosis not present

## 2017-03-14 DIAGNOSIS — M47816 Spondylosis without myelopathy or radiculopathy, lumbar region: Secondary | ICD-10-CM | POA: Diagnosis not present

## 2017-03-14 DIAGNOSIS — M5416 Radiculopathy, lumbar region: Secondary | ICD-10-CM | POA: Diagnosis not present

## 2017-03-23 ENCOUNTER — Other Ambulatory Visit: Payer: Self-pay | Admitting: Family Medicine

## 2017-03-23 DIAGNOSIS — F32A Depression, unspecified: Secondary | ICD-10-CM

## 2017-03-23 DIAGNOSIS — F329 Major depressive disorder, single episode, unspecified: Secondary | ICD-10-CM

## 2017-04-14 DIAGNOSIS — G894 Chronic pain syndrome: Secondary | ICD-10-CM | POA: Diagnosis not present

## 2017-04-14 DIAGNOSIS — M47816 Spondylosis without myelopathy or radiculopathy, lumbar region: Secondary | ICD-10-CM | POA: Diagnosis not present

## 2017-04-14 DIAGNOSIS — M5416 Radiculopathy, lumbar region: Secondary | ICD-10-CM | POA: Diagnosis not present

## 2017-04-21 DIAGNOSIS — Z9483 Pancreas transplant status: Secondary | ICD-10-CM | POA: Diagnosis not present

## 2017-04-21 DIAGNOSIS — N189 Chronic kidney disease, unspecified: Secondary | ICD-10-CM | POA: Diagnosis not present

## 2017-04-21 DIAGNOSIS — Z940 Kidney transplant status: Secondary | ICD-10-CM | POA: Diagnosis not present

## 2017-04-28 DIAGNOSIS — E212 Other hyperparathyroidism: Secondary | ICD-10-CM | POA: Diagnosis not present

## 2017-04-28 DIAGNOSIS — I10 Essential (primary) hypertension: Secondary | ICD-10-CM | POA: Diagnosis not present

## 2017-04-28 DIAGNOSIS — Z23 Encounter for immunization: Secondary | ICD-10-CM | POA: Diagnosis not present

## 2017-04-28 DIAGNOSIS — N182 Chronic kidney disease, stage 2 (mild): Secondary | ICD-10-CM | POA: Diagnosis not present

## 2017-04-28 DIAGNOSIS — D631 Anemia in chronic kidney disease: Secondary | ICD-10-CM | POA: Diagnosis not present

## 2017-05-12 DIAGNOSIS — M47816 Spondylosis without myelopathy or radiculopathy, lumbar region: Secondary | ICD-10-CM | POA: Diagnosis not present

## 2017-05-12 DIAGNOSIS — M5416 Radiculopathy, lumbar region: Secondary | ICD-10-CM | POA: Diagnosis not present

## 2017-05-12 DIAGNOSIS — E1140 Type 2 diabetes mellitus with diabetic neuropathy, unspecified: Secondary | ICD-10-CM | POA: Diagnosis not present

## 2017-05-12 DIAGNOSIS — G894 Chronic pain syndrome: Secondary | ICD-10-CM | POA: Diagnosis not present

## 2017-05-25 ENCOUNTER — Other Ambulatory Visit: Payer: Self-pay | Admitting: Family Medicine

## 2017-05-25 DIAGNOSIS — F329 Major depressive disorder, single episode, unspecified: Secondary | ICD-10-CM

## 2017-05-25 DIAGNOSIS — F32A Depression, unspecified: Secondary | ICD-10-CM

## 2017-05-26 ENCOUNTER — Other Ambulatory Visit: Payer: Self-pay

## 2017-05-26 DIAGNOSIS — F329 Major depressive disorder, single episode, unspecified: Secondary | ICD-10-CM

## 2017-05-26 DIAGNOSIS — F32A Depression, unspecified: Secondary | ICD-10-CM

## 2017-05-26 MED ORDER — BUPROPION HCL ER (SR) 150 MG PO TB12: 150.0000 mg | ORAL_TABLET | Freq: Two times a day (BID) | ORAL | 1 refills | Status: DC

## 2017-06-16 DIAGNOSIS — G894 Chronic pain syndrome: Secondary | ICD-10-CM | POA: Diagnosis not present

## 2017-06-16 DIAGNOSIS — M47816 Spondylosis without myelopathy or radiculopathy, lumbar region: Secondary | ICD-10-CM | POA: Diagnosis not present

## 2017-06-16 DIAGNOSIS — M5416 Radiculopathy, lumbar region: Secondary | ICD-10-CM | POA: Diagnosis not present

## 2017-06-16 DIAGNOSIS — E1140 Type 2 diabetes mellitus with diabetic neuropathy, unspecified: Secondary | ICD-10-CM | POA: Diagnosis not present

## 2017-06-16 DIAGNOSIS — Z79891 Long term (current) use of opiate analgesic: Secondary | ICD-10-CM | POA: Diagnosis not present

## 2017-07-14 DIAGNOSIS — E1140 Type 2 diabetes mellitus with diabetic neuropathy, unspecified: Secondary | ICD-10-CM | POA: Diagnosis not present

## 2017-07-14 DIAGNOSIS — M47816 Spondylosis without myelopathy or radiculopathy, lumbar region: Secondary | ICD-10-CM | POA: Diagnosis not present

## 2017-07-14 DIAGNOSIS — M5416 Radiculopathy, lumbar region: Secondary | ICD-10-CM | POA: Diagnosis not present

## 2017-07-14 DIAGNOSIS — G894 Chronic pain syndrome: Secondary | ICD-10-CM | POA: Diagnosis not present

## 2017-07-24 ENCOUNTER — Encounter: Payer: Self-pay | Admitting: Family Medicine

## 2017-07-24 ENCOUNTER — Ambulatory Visit: Payer: BLUE CROSS/BLUE SHIELD | Admitting: Family Medicine

## 2017-07-24 VITALS — BP 138/80 | HR 88 | Temp 98.6°F | Resp 12 | Ht 67.0 in | Wt 147.5 lb

## 2017-07-24 DIAGNOSIS — G4719 Other hypersomnia: Secondary | ICD-10-CM

## 2017-07-24 DIAGNOSIS — I10 Essential (primary) hypertension: Secondary | ICD-10-CM

## 2017-07-24 DIAGNOSIS — R5382 Chronic fatigue, unspecified: Secondary | ICD-10-CM

## 2017-07-24 DIAGNOSIS — N529 Male erectile dysfunction, unspecified: Secondary | ICD-10-CM | POA: Diagnosis not present

## 2017-07-24 DIAGNOSIS — E559 Vitamin D deficiency, unspecified: Secondary | ICD-10-CM | POA: Diagnosis not present

## 2017-07-24 DIAGNOSIS — F331 Major depressive disorder, recurrent, moderate: Secondary | ICD-10-CM | POA: Diagnosis not present

## 2017-07-24 LAB — CBC WITH DIFFERENTIAL/PLATELET
BASOS PCT: 0.6 % (ref 0.0–3.0)
Basophils Absolute: 0.1 10*3/uL (ref 0.0–0.1)
EOS ABS: 0.3 10*3/uL (ref 0.0–0.7)
EOS PCT: 2.4 % (ref 0.0–5.0)
HEMATOCRIT: 49 % (ref 39.0–52.0)
HEMOGLOBIN: 16.1 g/dL (ref 13.0–17.0)
LYMPHS PCT: 12.4 % (ref 12.0–46.0)
Lymphs Abs: 1.5 10*3/uL (ref 0.7–4.0)
MCHC: 32.8 g/dL (ref 30.0–36.0)
MCV: 98.7 fl (ref 78.0–100.0)
Monocytes Absolute: 0.8 10*3/uL (ref 0.1–1.0)
Monocytes Relative: 6.3 % (ref 3.0–12.0)
NEUTROS ABS: 9.4 10*3/uL — AB (ref 1.4–7.7)
Neutrophils Relative %: 78.3 % — ABNORMAL HIGH (ref 43.0–77.0)
PLATELETS: 251 10*3/uL (ref 150.0–400.0)
RBC: 4.97 Mil/uL (ref 4.22–5.81)
RDW: 14.2 % (ref 11.5–15.5)
WBC: 12 10*3/uL — AB (ref 4.0–10.5)

## 2017-07-24 LAB — BASIC METABOLIC PANEL
BUN: 15 mg/dL (ref 6–23)
CHLORIDE: 106 meq/L (ref 96–112)
CO2: 29 meq/L (ref 19–32)
Calcium: 9.6 mg/dL (ref 8.4–10.5)
Creatinine, Ser: 0.97 mg/dL (ref 0.40–1.50)
GFR: 89.24 mL/min (ref >60.00)
GLUCOSE: 84 mg/dL (ref 70–99)
Potassium: 4.8 mEq/L (ref 3.5–5.1)
SODIUM: 142 meq/L (ref 135–145)

## 2017-07-24 LAB — VITAMIN D 25 HYDROXY (VIT D DEFICIENCY, FRACTURES): VITD: 29.09 ng/mL — ABNORMAL LOW (ref 30.00–100.00)

## 2017-07-24 LAB — VITAMIN B12: Vitamin B-12: 555 pg/mL (ref 211–911)

## 2017-07-24 LAB — TESTOSTERONE: TESTOSTERONE: 433.78 ng/dL (ref 300.00–890.00)

## 2017-07-24 LAB — TSH: TSH: 2.36 u[IU]/mL (ref 0.35–4.50)

## 2017-07-24 LAB — T3, FREE: T3, Free: 3.6 pg/mL (ref 2.3–4.2)

## 2017-07-24 LAB — T4, FREE: Free T4: 0.73 ng/dL (ref 0.60–1.60)

## 2017-07-24 MED ORDER — SERTRALINE HCL 50 MG PO TABS: 50.0000 mg | ORAL_TABLET | Freq: Every day | ORAL | 1 refills | Status: DC

## 2017-07-24 NOTE — Patient Instructions (Addendum)
A few things to remember from today's visit:   Hypertension, unspecified type - Plan: Basic metabolic panel, CBC with Differential/Platelet  Chronic fatigue - Plan: TSH, T4, free, T3, free, Testosterone, Vitamin B12, CBC with Differential/Platelet  History of simultaneous kidney and pancreas transplant (HCC)  Erectile dysfunction, unspecified erectile dysfunction type - Plan: TSH, Testosterone  Vitamin D deficiency, unspecified - Plan: VITAMIN D 25 Hydroxy (Vit-D Deficiency, Fractures)  Daytime hypersomnolence - Plan: Ambulatory referral to Neurology  Depression - Plan: sertraline (ZOLOFT) 50 MG tablet  Sertraline was sent to your pharmacy,since you have not taken it and willing to try again,. I sent prescriptions again.  We have ordered labs or studies at this visit.  It can take up to 1-2 weeks for results and processing. IF results require follow up or explanation, we will call you with instructions. Clinically stable results will be released to your [facility]. If you have not heard from us or cannot find your results in [facility] in 2 weeks please contact our office at [phone].  If you are not yet signed up for [facility], please consider signing up  Monitor blood pressure at home.    Please be sure medication list is accurate. If a new problem present, please set up appointment sooner than planned today.

## 2017-07-24 NOTE — Progress Notes (Signed)
ACUTE VISIT   HPI:  Chief Complaint  Patient presents with  . Follow-up    [name] is a 46 y.o. male, who is here today complaining of low energy level and daytime sleepiness. He has had fatigue for about 3-4 years. Problem is constant, he has not identified exacerbating or alleviating factors. In regard to sleep, he states that "I guess it is okay", he continuously from 2-9 hours a night, fatigue is not better when he sleeps through the night.  He also mentions "premature ejaculation" and erectile dysfunction, uses these 2 terms intermittently  in same conversation but when asking details about "premature ejaculation", states that is happens with intercourse, it seems like he is referring more to ED. He has tried Cialis before and has helped but cannot afford it.  History of depression.According to patient he was told he suffered from depression because of symptoms he is reporting today.   He does nothing he has depression.  He thinks he needs something to "boost" him up, "serotonin or something." He denies prior hospitalizations due to psychotic disorders or family history of depression/anxiety.  Serotonin is on his medication list, he states that he has not taken it and he is not sure who prescribed it. Last week he stopped Wellbutrin and Trazodone.   He denies suicidal thoughts.  He is requesting blood work done today, including thyroid and testosterone.  Lab Results  Component Value Date   ALT 9 (A) 12/27/2016   AST 14 12/27/2016   ALKPHOS 123 12/27/2016   BILITOT 0.4 11/02/2015   Lab Results  Component Value Date   CREATININE 1.0 12/27/2016   BUN 19 12/27/2016   NA 139 12/27/2016   K 4.6 12/27/2016   CL 106 11/02/2015   CO2 27 11/02/2015   Lab Results  Component Value Date   TSH 1.35 12/22/2015   Lab Results  Component Value Date   WBC 12.0 12/27/2016   HGB 15.9 12/27/2016   HCT 45 12/27/2016   MCV 96.2 12/22/2015   PLT 250 12/27/2016     She is not aware of sleep apnea.  He thinks he has "narcolepsy" and would like a prescription for Modafinil, which he has taken in the past. He obtained mediation without a Rx from [place]. He denies falling asleep while he is driving or waiting in the waiting room.  History of vitamin D deficiency, currently he is not on vitamin D supplementation.  HTN: Today BP mildly elevated. He does not check BP at home. Currently he is on Lisinopril 5 mg daily. S/P renal transplant, he follows with nephrologist.  Currently he is on Tacrolimus and Myfortic.  History of chronic pain and peripheral neuropathy (diabettic). He follows with pain management, currently he is on Hydromorphone. He is interested in trying "TENS", he got a small unit OTC and would like a Rx.  He discontinued Gabapentin 1-2 weeks ago.  + Smoker.  Review of Systems  Constitutional: Positive for fatigue. Negative for activity change, appetite change, fever and unexpected weight change.  HENT: Negative for facial swelling, mouth sores, nosebleeds, sore throat and trouble swallowing.   Eyes: Negative for redness and visual disturbance.  Respiratory: Negative for cough, shortness of breath and wheezing.   Cardiovascular: Negative for chest pain, palpitations and leg swelling.  Gastrointestinal: Negative for abdominal pain, blood in stool, nausea and vomiting.       No changes in bowel habits.  Endocrine: Negative for cold intolerance, heat intolerance, polydipsia,  polyphagia and polyuria.  Genitourinary: Negative for decreased urine volume, dysuria, flank pain, hematuria and testicular pain.  Musculoskeletal: Positive for myalgias. Negative for arthralgias, gait problem and joint swelling.  Skin: Negative for rash and wound.  Neurological: Negative for tremors, syncope, weakness and headaches.  Hematological: Negative for adenopathy. Does not bruise/bleed easily.  Psychiatric/Behavioral: Positive for sleep disturbance.  Negative for confusion, hallucinations and suicidal ideas. The patient is nervous/anxious.       Current Outpatient Medications on File Prior to Visit  Medication Sig Dispense Refill  . HYDROmorphone (DILAUDID) 4 MG tablet take 1 tablet by mouth every 6 hours if needed for pain  0  . lisinopril (PRINIVIL,ZESTRIL) 5 MG tablet Take 5 mg by mouth daily.    . mycophenolate (MYFORTIC) 180 MG EC tablet Take 180 mg by mouth 2 (two) times daily.    Marland Kitchen. sulfamethoxazole-trimethoprim (BACTRIM,SEPTRA) 400-80 MG tablet Take 1 tablet by mouth 3 (three) times a week.    . tacrolimus (PROGRAF) 1 MG capsule Take 1 mg by mouth. Pt take 5 in the morning and 4 at night    . [DISCONTINUED] diltiazem (CARDIZEM) 30 MG tablet Take 30 mg by mouth 4 (four) times daily. Ask patient to verify dosage and frequency. Not indicated on medical history form dated 10/23/10.     . [DISCONTINUED] insulin lispro (HUMALOG) 100 UNIT/ML injection Inject into the skin 3 (three) times daily before meals. Ask patient to verify name/dosage/. History form noted dosage of 34 units during day and 22 units at night.      No current facility-administered medications on file prior to visit.      Past Medical History:  Diagnosis Date  . Depression   . Diabetes mellitus   . Diabetic peripheral neuropathy (HCC) 11/24/2015  . Hypercholesterolemia   . Hypertension   . Migraines   . Renal disorder    No Known Allergies  Social History   Socioeconomic History  . Marital status: Married    Spouse name: None  . Number of children: 4  . Years of education: 10  . Highest education level: None  Social Needs  . Financial resource strain: None  . Food insecurity - worry: None  . Food insecurity - inability: None  . Transportation needs - medical: None  . Transportation needs - non-medical: None  Occupational History  . Occupation: Painter  Tobacco Use  . Smoking status: Current Every Day Smoker    Packs/day: 1.00    Years: 22.00     Pack years: 22.00    Types: Cigarettes  . Smokeless tobacco: Never Used  Substance and Sexual Activity  . Alcohol use: Yes    Alcohol/week: 0.0 oz    Comment: socially  . Drug use: No  . Sexual activity: None  Other Topics Concern  . None  Social History Narrative   Paints houses   Wife works at a Retail bankerseamtress at [employer]   4 children age 719 to 89   Regular exercise-no current exercise regimen   Caffeine Use-no    Vitals:   07/24/17 1131 07/24/17 1222  BP: (!) 142/84 138/80  Pulse: 88   Resp: 12   Temp: 98.6 F (37 C)   SpO2: 97%    Body mass index is 23.1 kg/m.   Physical Exam  Nursing note and vitals reviewed. Constitutional: He is oriented to person, place, and time. He appears well-developed and well-nourished. No distress.  HENT:  Head: Normocephalic and atraumatic.  Mouth/Throat: Oropharynx is clear and  moist and mucous membranes are normal.  Eyes: Conjunctivae are normal. Pupils are equal, round, and reactive to light. No scleral icterus.  Neck: No JVD present. No tracheal deviation present. No thyroid mass and no thyromegaly present.  Cardiovascular: Normal rate and regular rhythm.  No murmur heard. Respiratory: Effort normal and breath sounds normal. No respiratory distress.  GI: Soft. He exhibits no mass. There is no hepatomegaly. There is no tenderness.  Musculoskeletal: He exhibits no edema or tenderness.  Lymphadenopathy:    He has no cervical adenopathy.  Neurological: He is alert and oriented to person, place, and time. He has normal strength. No cranial nerve deficit. Gait normal.  Skin: Skin is warm. No rash noted. No erythema.  Psychiatric: His mood appears anxious. His affect is not labile. Cognition and memory are normal. He exhibits a depressed mood. He expresses no suicidal ideation.  Well groomed, poor eye contact.     ASSESSMENT AND PLAN:   Mr. Dominga Ferrygustin was seen today for follow-up.  Diagnoses and all orders for this visit:  Chronic  fatigue  We discussed possible etiologies: Systemic illness, immunologic,endocrinology,sleep disorder, psychiatric/psychologic, infectious,medications side effects, and idiopathic. Examination today does not suggest a serious process. Medications and some of his chronic problem could be causing problem.  Healthy diet and regular physical activity may help.  Further recommendations will be given according to lab results.  -     TSH -     T4, free -     T3, free -     Testosterone -     Vitamin B12 -     CBC with Differential/Platelet  Hypertension, unspecified type  Initially mildly elevated BP, improved when rechecking it. No changes in Lisinopril dose for now. Continue low-salt diet. Recommend trying to monitor BP at home.  -     Basic metabolic panel -     CBC with Differential/Platelet  Erectile dysfunction, unspecified erectile dysfunction type  Educated about diagnosis and possible etiologies.  Further recommendations will be given according to lab results.  -     TSH -     Testosterone  Vitamin D deficiency, unspecified  He is not on vitamin D supplementation. Further recommendations will be given according to lab results.  -     VITAMIN D 25 Hydroxy (Vit-D Deficiency, Fractures)  Daytime hypersomnolence  Possible etiologies discussed. Explained that I cannot prescribe Modafinil  without a clear indication, this upsets him. Referred to neurologist/sleep specialist to evaluate for narcolepsy or other sleep disorder.  -     Ambulatory referral to Neurology  Major depressive disorder, recurrent episode, moderate (HCC)  He would like to try Sertraline, which he does not think he has been taking for a while. We discussed some side effects. Instructed about warning signs. Follow-up in 4 weeks.  -     sertraline (ZOLOFT) 50 MG tablet; Take 1 tablet (50 mg total) by mouth daily.     In regard to chronic pain and peripheral neuropathy, he was instructed to  continue following with pain management.   Return in about 4 weeks (around 08/21/2017) for mood,fatigue.     -Mr.Lenice Llamasgustin Miranda was advised to seek immediate medical attention if sudden worsening symptoms.      [name]. [place], MD  [facility]. [facility].

## 2017-08-04 ENCOUNTER — Telehealth: Payer: Self-pay | Admitting: Family Medicine

## 2017-08-04 ENCOUNTER — Ambulatory Visit: Payer: Self-pay | Admitting: *Deleted

## 2017-08-04 NOTE — Telephone Encounter (Signed)
FYI only, per practice administrator okay to close encounter, it has been handled by the [facility] triage nurse.

## 2017-08-04 NOTE — Telephone Encounter (Signed)
  Answer Assessment - Initial Assessment Questions 1. CONCERN: "What happened that made you call today?"     Depression  2. DEPRESSION SYMPTOM SCREENING: "How are you feeling overall?" (e.g., decreased energy, increased sleeping or difficulty sleeping, difficulty concentrating, feelings of sadness, guilt, hopelessness, or worthlessness)     Decreased energy, feelings of sadness, hopelessness 3. RISK OF HARM - SUICIDAL IDEATION:  "Do you ever have thoughts of hurting or killing yourself?"  (e.g., yes, no, no but preoccupation with thoughts about death)   - INTENT:  "Do you have thoughts of hurting or killing yourself right NOW?" (e.g., yes, no, N/A)   - PLAN: "Do you have a specific plan for how you would do this?" (e.g., gun, knife, overdose, no plan, N/A)     Yes, quickest way cutting the vein 4. RISK OF HARM - HOMICIDAL IDEATION:  "Do you ever have thoughts of hurting or killing someone else?"  (e.g., yes, no, no but preoccupation with thoughts about death)   - INTENT:  "Do you have thoughts of hurting or killing someone right NOW?" (e.g., yes, no, N/A)   - PLAN: "Do you have a specific plan for how you would do this?" (e.g., gun, knife, no plan, N/A)      no 5. FUNCTIONAL IMPAIRMENT: "How have things been going for you overall in your life? Have you had any more difficulties than usual doing your normal daily activities?"  (e.g., better, same, worse; self-care, school, work, interactions)     Underpaid, in pain all day, can not get a decent massage from any one, wife states "nothing is wrong with you" 6. SUPPORT: "Who is with you now?" "Who do you live with?" "Do you have family or friends nearby who you can talk to?"      2 guys with him, at work right now. Does not have anyone that he can talk to right now 7. THERAPIST: "Do you have a counselor or therapist? Name?"     Can not remember the name. Does not have them any more. 8. STRESSORS: "Has there been any new stress or recent changes in  your life?"     Job, family everything 9. DRUG ABUSE/ALCOHOL: "Do you drink alcohol or use any illegal drugs?"      no 10. OTHER: "Do you have any other health or medical symptoms right now?" (e.g., fever)       Frustrations, pain in lower back, legs and feet 11. PREGNANCY: "Is there any chance you are pregnant?" "When was your last menstrual period?"       n/a  Protocols used: DEPRESSION-A-AH

## 2017-08-04 NOTE — Telephone Encounter (Signed)
This pt should have been triaged.

## 2017-08-04 NOTE — Telephone Encounter (Addendum)
Pt states he is depressed because he is hurting all the time. He states he can not get his lab results that was done on Dec 17 th.  He has said that he has a plan and it would be quick, just to cut his vein. [facility] and [facility] was notified. Attempted to get location of patient but he refused to give and disconnected the phone call. He is working out of town, Pension scheme managerpainting a house. He refuses to talk to anyone else right now. Pt's wife was contacted to get his location. She is out of the country and will call him. She will give us a call back when he gives his location and we can call  911.  Reason for Disposition . Patient is threatening suicide now  Protocols used: DEPRESSION-A-AH

## 2017-08-04 NOTE — Telephone Encounter (Signed)
Pt called because he is depressed and wants results from his labs that were done on Dec 17th.

## 2017-08-11 ENCOUNTER — Encounter: Payer: Self-pay | Admitting: *Deleted

## 2017-08-11 DIAGNOSIS — E1140 Type 2 diabetes mellitus with diabetic neuropathy, unspecified: Secondary | ICD-10-CM | POA: Diagnosis not present

## 2017-08-11 DIAGNOSIS — M5416 Radiculopathy, lumbar region: Secondary | ICD-10-CM | POA: Diagnosis not present

## 2017-08-11 DIAGNOSIS — G894 Chronic pain syndrome: Secondary | ICD-10-CM | POA: Diagnosis not present

## 2017-08-11 DIAGNOSIS — M47816 Spondylosis without myelopathy or radiculopathy, lumbar region: Secondary | ICD-10-CM | POA: Diagnosis not present

## 2017-08-28 ENCOUNTER — Other Ambulatory Visit: Payer: Self-pay | Admitting: Family Medicine

## 2017-08-28 DIAGNOSIS — F329 Major depressive disorder, single episode, unspecified: Secondary | ICD-10-CM

## 2017-08-28 DIAGNOSIS — F32A Depression, unspecified: Secondary | ICD-10-CM

## 2017-08-29 DIAGNOSIS — Z940 Kidney transplant status: Secondary | ICD-10-CM | POA: Diagnosis not present

## 2017-08-29 DIAGNOSIS — N189 Chronic kidney disease, unspecified: Secondary | ICD-10-CM | POA: Diagnosis not present

## 2017-08-29 DIAGNOSIS — Z9483 Pancreas transplant status: Secondary | ICD-10-CM | POA: Diagnosis not present

## 2017-09-05 DIAGNOSIS — I10 Essential (primary) hypertension: Secondary | ICD-10-CM | POA: Diagnosis not present

## 2017-09-05 DIAGNOSIS — N182 Chronic kidney disease, stage 2 (mild): Secondary | ICD-10-CM | POA: Diagnosis not present

## 2017-09-05 DIAGNOSIS — D631 Anemia in chronic kidney disease: Secondary | ICD-10-CM | POA: Diagnosis not present

## 2017-09-05 DIAGNOSIS — E212 Other hyperparathyroidism: Secondary | ICD-10-CM | POA: Diagnosis not present

## 2017-09-08 DIAGNOSIS — G894 Chronic pain syndrome: Secondary | ICD-10-CM | POA: Diagnosis not present

## 2017-09-08 DIAGNOSIS — M47816 Spondylosis without myelopathy or radiculopathy, lumbar region: Secondary | ICD-10-CM | POA: Diagnosis not present

## 2017-09-08 DIAGNOSIS — M5416 Radiculopathy, lumbar region: Secondary | ICD-10-CM | POA: Diagnosis not present

## 2017-09-08 DIAGNOSIS — E1140 Type 2 diabetes mellitus with diabetic neuropathy, unspecified: Secondary | ICD-10-CM | POA: Diagnosis not present

## 2017-10-06 DIAGNOSIS — E1140 Type 2 diabetes mellitus with diabetic neuropathy, unspecified: Secondary | ICD-10-CM | POA: Diagnosis not present

## 2017-10-06 DIAGNOSIS — M5416 Radiculopathy, lumbar region: Secondary | ICD-10-CM | POA: Diagnosis not present

## 2017-10-06 DIAGNOSIS — M47816 Spondylosis without myelopathy or radiculopathy, lumbar region: Secondary | ICD-10-CM | POA: Diagnosis not present

## 2017-10-06 DIAGNOSIS — G894 Chronic pain syndrome: Secondary | ICD-10-CM | POA: Diagnosis not present

## 2017-10-19 IMAGING — MR MR LUMBAR SPINE W/O CM
4 of 5 series · 27 of 48 positions shown · non-contrast
Comparison: 03/05/2016

CLINICAL DATA: Persistent lower back pain. Bilateral, right greater
than left sciatica

EXAM:
MRI LUMBAR SPINE WITHOUT CONTRAST
TECHNIQUE: Multiplanar, multisequence MR imaging of the lumbar spine was
performed. No intravenous contrast was administered.

[Series 3: T2 post-contrast · sagittal · 4.0mm · 0.55mm/px · 5 of 13 slices shown]
[im 1/13]
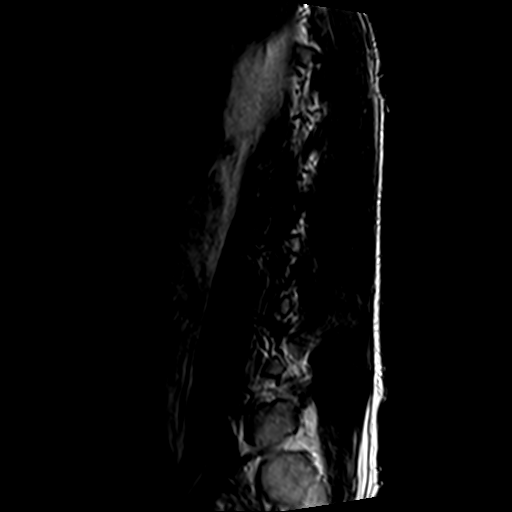
[im 4/13]
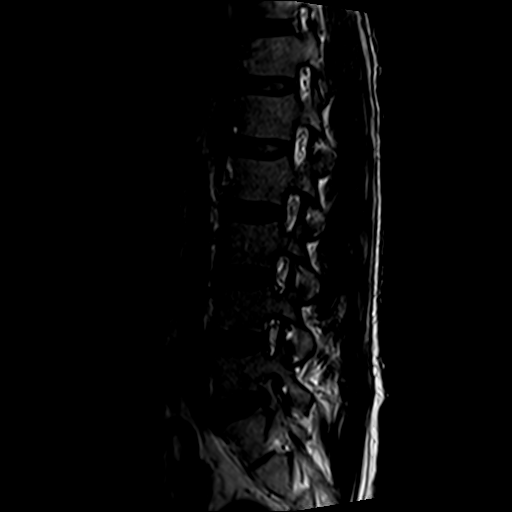
[im 7/13]
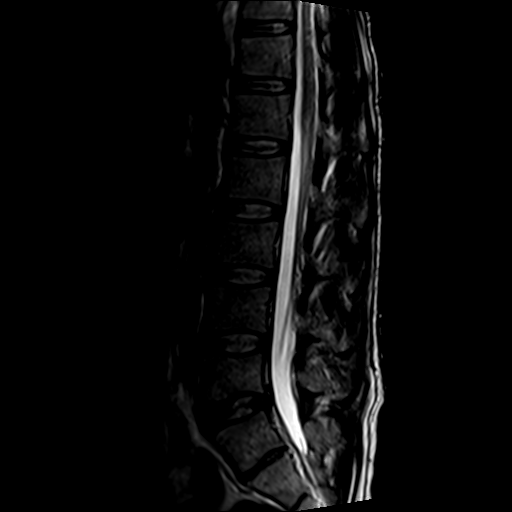
[im 10/13]
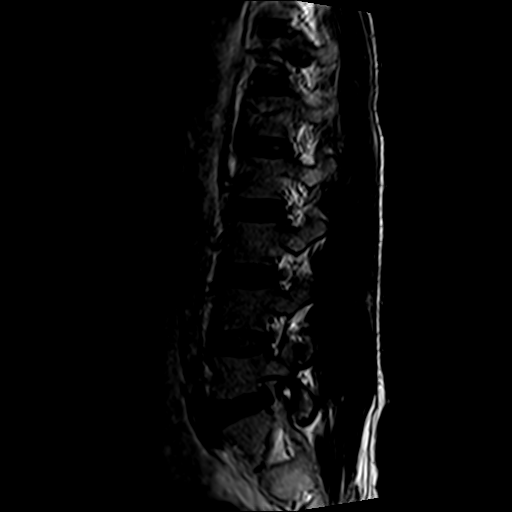
[im 13/13]
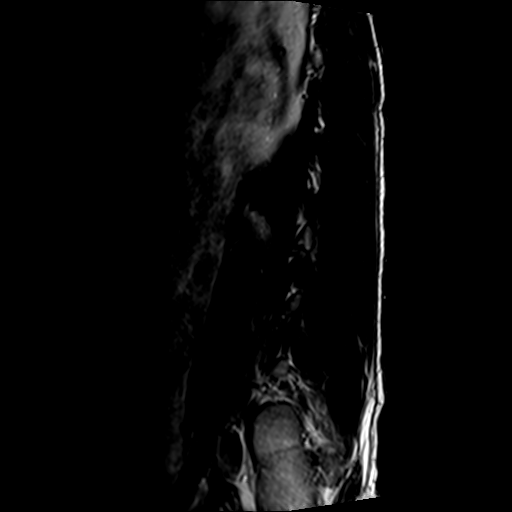

[Series 5: T1 · sagittal · 4.0mm · 0.55mm/px · 6 of 13 slices shown (1 of 2)]
[im 1/13]
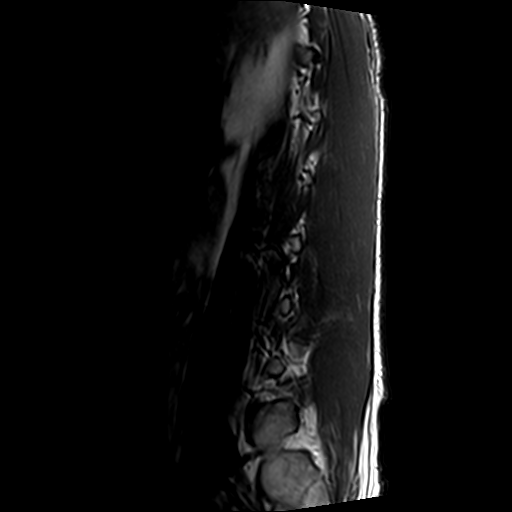
[im 3/13]
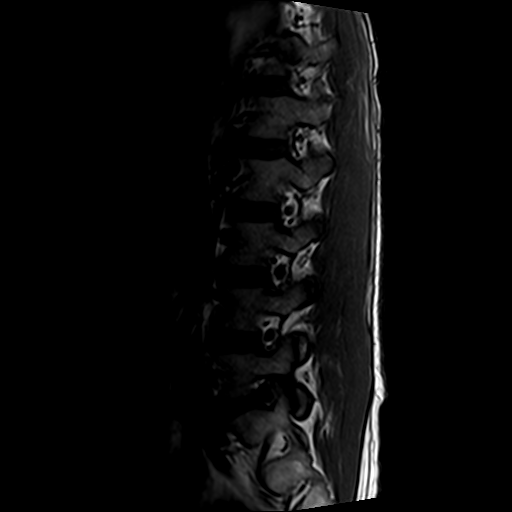
[im 5/13]
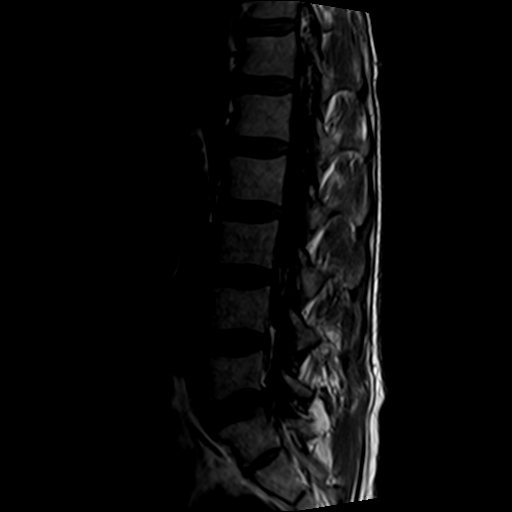
[im 8/13]
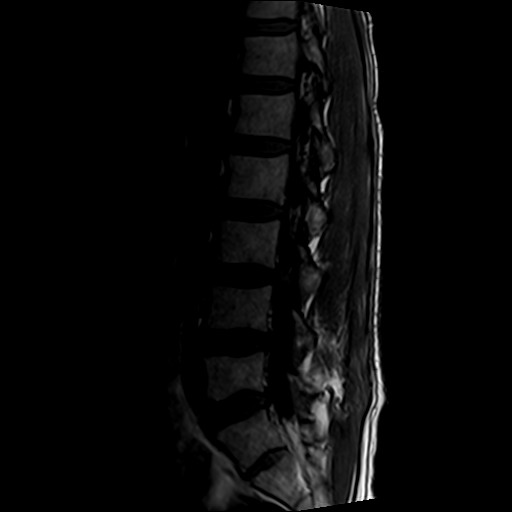
[im 10/13]
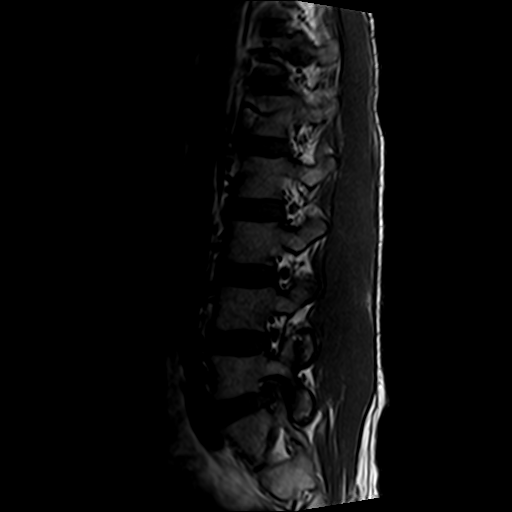
[im 13/13]
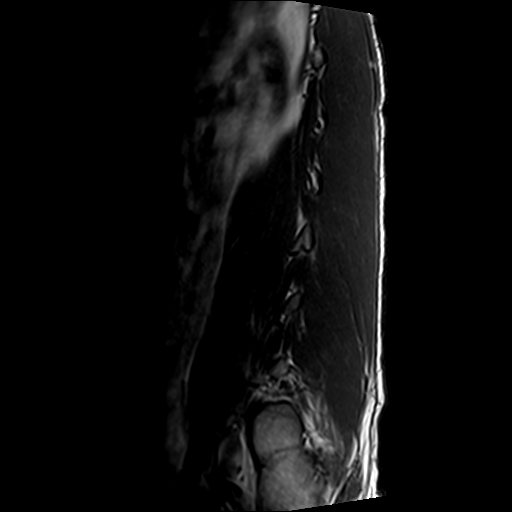

[Series 6: T1 · axial · 4.0mm · 0.35mm/px · z∈[-125,+25]mm · 6 of 36 slices shown (2 of 2)]
[im 3/36]
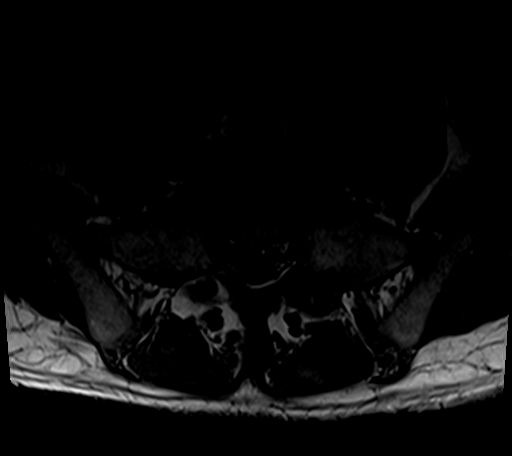
[im 5/36]
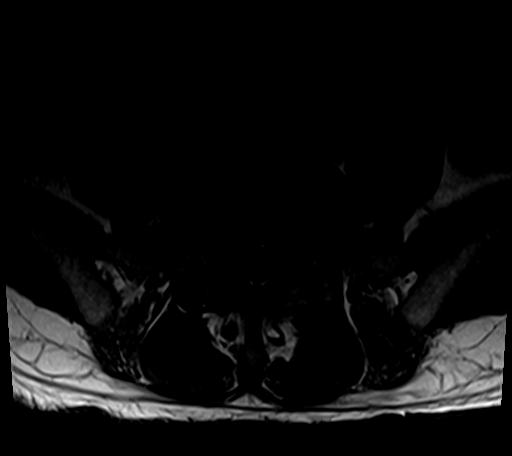
[im 8/36]
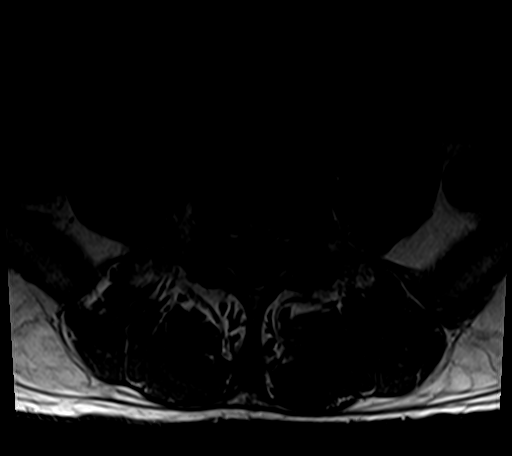
[im 12/36]
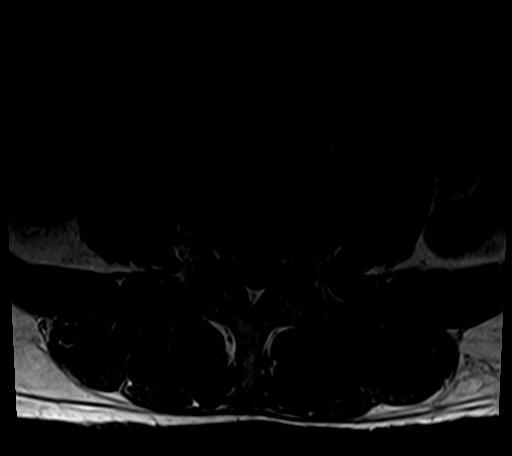
[im 19/36]
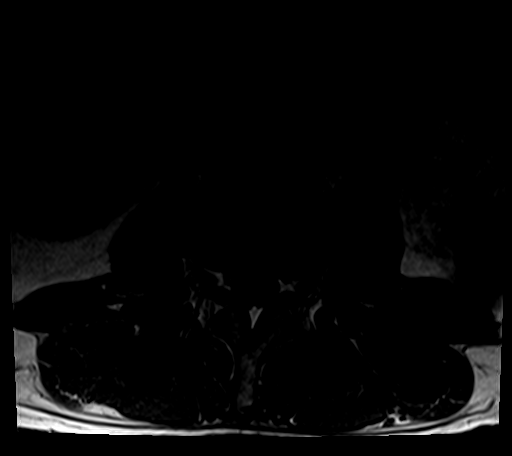
[im 31/36]
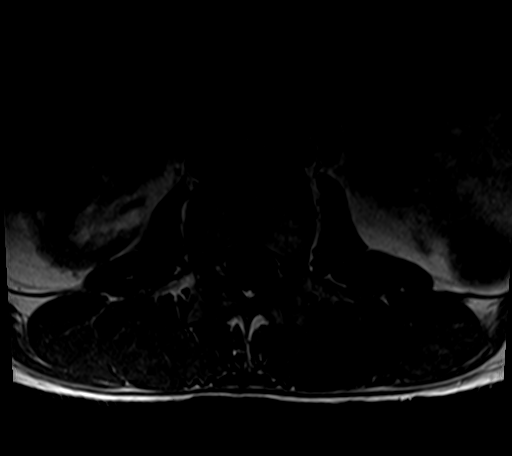

[Series 7: T2 · axial · 4.0mm · 0.70mm/px · z∈[-125,+51]mm · 10 of 36 slices shown]
[im 3/36]
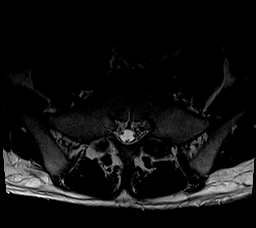
[im 5/36]
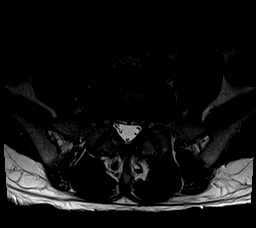
[im 8/36]
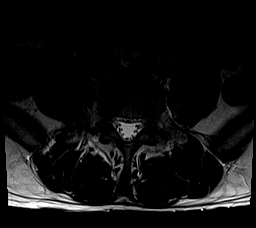
[im 12/36]
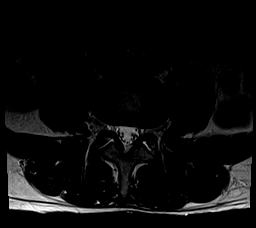
[im 17/36]
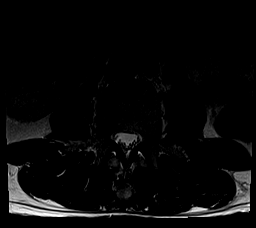
[im 19/36]
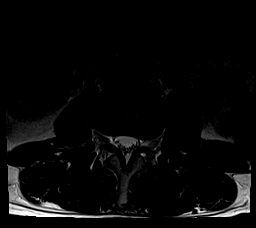
[im 22/36]
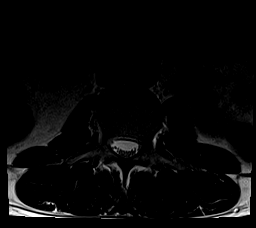
[im 26/36]
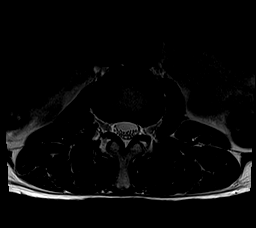
[im 31/36]
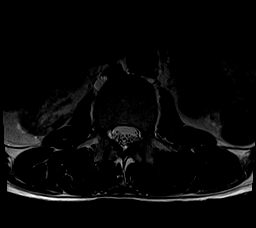
[im 36/36]
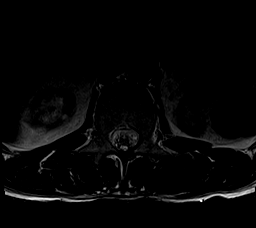

[27 of 48 positions shown; findings below may reference images not displayed]

FINDINGS: Segmentation: Spinal numbering as previously established, with
rudimentary disc space at S1-2.

Alignment:  Straightening without subluxation

Vertebrae:  No fracture, evidence of discitis, or bone lesion.

Conus medullaris: Extends to the L1-2 disc level and appears normal.

Paraspinal and other soft tissues: End-stage renal disease with
atrophic kidneys. There is a renal transplant in the right iliac
fossa.

Disc levels:

L5-S1: Minimal disc desiccation with disc bulging or bilateral
foraminal disc protrusions. Disc material compresses both L5 nerve
roots in the foramina. Small discrete central protrusion that is up
turning on sagittal acquisition. Patent spinal canal
IMPRESSION: 1. L5-S1 chronic disc protrusions with biforaminal L5 impingement.
2. No degenerative changes or impingement at the other levels.
3. Transitional S1 vertebra.

## 2017-11-02 DIAGNOSIS — J0111 Acute recurrent frontal sinusitis: Secondary | ICD-10-CM | POA: Diagnosis not present

## 2017-11-02 DIAGNOSIS — I119 Hypertensive heart disease without heart failure: Secondary | ICD-10-CM | POA: Diagnosis not present

## 2017-11-02 DIAGNOSIS — H6501 Acute serous otitis media, right ear: Secondary | ICD-10-CM | POA: Diagnosis not present

## 2017-11-02 DIAGNOSIS — H9209 Otalgia, unspecified ear: Secondary | ICD-10-CM | POA: Diagnosis not present

## 2017-12-15 DIAGNOSIS — M5416 Radiculopathy, lumbar region: Secondary | ICD-10-CM | POA: Diagnosis not present

## 2017-12-15 DIAGNOSIS — M545 Low back pain: Secondary | ICD-10-CM | POA: Diagnosis not present

## 2017-12-15 DIAGNOSIS — M5116 Intervertebral disc disorders with radiculopathy, lumbar region: Secondary | ICD-10-CM | POA: Diagnosis not present

## 2017-12-15 DIAGNOSIS — Z79891 Long term (current) use of opiate analgesic: Secondary | ICD-10-CM | POA: Diagnosis not present

## 2017-12-15 DIAGNOSIS — Z5181 Encounter for therapeutic drug level monitoring: Secondary | ICD-10-CM | POA: Diagnosis not present

## 2017-12-15 DIAGNOSIS — M47897 Other spondylosis, lumbosacral region: Secondary | ICD-10-CM | POA: Diagnosis not present

## 2017-12-15 DIAGNOSIS — F1120 Opioid dependence, uncomplicated: Secondary | ICD-10-CM | POA: Diagnosis not present

## 2017-12-30 DIAGNOSIS — E212 Other hyperparathyroidism: Secondary | ICD-10-CM | POA: Diagnosis not present

## 2017-12-30 DIAGNOSIS — Z9483 Pancreas transplant status: Secondary | ICD-10-CM | POA: Diagnosis not present

## 2017-12-30 DIAGNOSIS — N189 Chronic kidney disease, unspecified: Secondary | ICD-10-CM | POA: Diagnosis not present

## 2017-12-30 DIAGNOSIS — N182 Chronic kidney disease, stage 2 (mild): Secondary | ICD-10-CM | POA: Diagnosis not present

## 2018-01-25 DIAGNOSIS — Z79891 Long term (current) use of opiate analgesic: Secondary | ICD-10-CM | POA: Diagnosis not present

## 2018-01-25 DIAGNOSIS — M5116 Intervertebral disc disorders with radiculopathy, lumbar region: Secondary | ICD-10-CM | POA: Diagnosis not present

## 2018-01-25 DIAGNOSIS — F1120 Opioid dependence, uncomplicated: Secondary | ICD-10-CM | POA: Diagnosis not present

## 2018-01-25 DIAGNOSIS — M545 Low back pain: Secondary | ICD-10-CM | POA: Diagnosis not present

## 2018-01-25 DIAGNOSIS — M47897 Other spondylosis, lumbosacral region: Secondary | ICD-10-CM | POA: Diagnosis not present

## 2018-01-25 DIAGNOSIS — M5416 Radiculopathy, lumbar region: Secondary | ICD-10-CM | POA: Diagnosis not present

## 2018-01-25 DIAGNOSIS — Z5181 Encounter for therapeutic drug level monitoring: Secondary | ICD-10-CM | POA: Diagnosis not present

## 2018-01-25 DIAGNOSIS — M47896 Other spondylosis, lumbar region: Secondary | ICD-10-CM | POA: Diagnosis not present

## 2018-02-09 DIAGNOSIS — M7541 Impingement syndrome of right shoulder: Secondary | ICD-10-CM | POA: Diagnosis not present

## 2018-02-09 DIAGNOSIS — M7521 Bicipital tendinitis, right shoulder: Secondary | ICD-10-CM | POA: Diagnosis not present

## 2018-02-09 DIAGNOSIS — M25512 Pain in left shoulder: Secondary | ICD-10-CM | POA: Diagnosis not present

## 2018-02-09 DIAGNOSIS — M7542 Impingement syndrome of left shoulder: Secondary | ICD-10-CM | POA: Diagnosis not present

## 2018-02-09 DIAGNOSIS — M7522 Bicipital tendinitis, left shoulder: Secondary | ICD-10-CM | POA: Diagnosis not present

## 2018-02-09 DIAGNOSIS — M25511 Pain in right shoulder: Secondary | ICD-10-CM | POA: Diagnosis not present

## 2018-03-05 DIAGNOSIS — M5416 Radiculopathy, lumbar region: Secondary | ICD-10-CM | POA: Diagnosis not present

## 2018-03-05 DIAGNOSIS — Z79891 Long term (current) use of opiate analgesic: Secondary | ICD-10-CM | POA: Diagnosis not present

## 2018-03-05 DIAGNOSIS — Z940 Kidney transplant status: Secondary | ICD-10-CM | POA: Diagnosis not present

## 2018-03-05 DIAGNOSIS — Z48298 Encounter for aftercare following other organ transplant: Secondary | ICD-10-CM | POA: Diagnosis not present

## 2018-03-05 DIAGNOSIS — F1120 Opioid dependence, uncomplicated: Secondary | ICD-10-CM | POA: Diagnosis not present

## 2018-03-05 DIAGNOSIS — N390 Urinary tract infection, site not specified: Secondary | ICD-10-CM | POA: Diagnosis not present

## 2018-03-05 DIAGNOSIS — Z5181 Encounter for therapeutic drug level monitoring: Secondary | ICD-10-CM | POA: Diagnosis not present

## 2018-03-05 DIAGNOSIS — Z79899 Other long term (current) drug therapy: Secondary | ICD-10-CM | POA: Diagnosis not present

## 2018-03-05 DIAGNOSIS — M5116 Intervertebral disc disorders with radiculopathy, lumbar region: Secondary | ICD-10-CM | POA: Diagnosis not present

## 2018-03-09 DIAGNOSIS — Z940 Kidney transplant status: Secondary | ICD-10-CM | POA: Diagnosis not present

## 2018-03-09 DIAGNOSIS — E119 Type 2 diabetes mellitus without complications: Secondary | ICD-10-CM | POA: Diagnosis not present

## 2018-03-09 DIAGNOSIS — Z79899 Other long term (current) drug therapy: Secondary | ICD-10-CM | POA: Diagnosis not present

## 2018-03-09 DIAGNOSIS — E785 Hyperlipidemia, unspecified: Secondary | ICD-10-CM | POA: Diagnosis not present

## 2018-03-09 DIAGNOSIS — Z48298 Encounter for aftercare following other organ transplant: Secondary | ICD-10-CM | POA: Diagnosis not present

## 2018-03-09 DIAGNOSIS — N390 Urinary tract infection, site not specified: Secondary | ICD-10-CM | POA: Diagnosis not present

## 2018-03-12 DIAGNOSIS — Z9483 Pancreas transplant status: Secondary | ICD-10-CM | POA: Diagnosis not present

## 2018-03-12 DIAGNOSIS — M7522 Bicipital tendinitis, left shoulder: Secondary | ICD-10-CM | POA: Diagnosis not present

## 2018-03-12 DIAGNOSIS — Z792 Long term (current) use of antibiotics: Secondary | ICD-10-CM | POA: Diagnosis not present

## 2018-03-12 DIAGNOSIS — Z4822 Encounter for aftercare following kidney transplant: Secondary | ICD-10-CM | POA: Diagnosis not present

## 2018-03-12 DIAGNOSIS — M25512 Pain in left shoulder: Secondary | ICD-10-CM | POA: Diagnosis not present

## 2018-03-12 DIAGNOSIS — M25511 Pain in right shoulder: Secondary | ICD-10-CM | POA: Diagnosis not present

## 2018-03-12 DIAGNOSIS — Z79899 Other long term (current) drug therapy: Secondary | ICD-10-CM | POA: Diagnosis not present

## 2018-03-12 DIAGNOSIS — Z940 Kidney transplant status: Secondary | ICD-10-CM | POA: Diagnosis not present

## 2018-03-12 DIAGNOSIS — I10 Essential (primary) hypertension: Secondary | ICD-10-CM | POA: Diagnosis not present

## 2018-03-12 DIAGNOSIS — F17200 Nicotine dependence, unspecified, uncomplicated: Secondary | ICD-10-CM | POA: Diagnosis not present

## 2018-03-12 DIAGNOSIS — R944 Abnormal results of kidney function studies: Secondary | ICD-10-CM | POA: Diagnosis not present

## 2018-03-12 DIAGNOSIS — M7521 Bicipital tendinitis, right shoulder: Secondary | ICD-10-CM | POA: Diagnosis not present

## 2018-04-02 DIAGNOSIS — M79604 Pain in right leg: Secondary | ICD-10-CM | POA: Diagnosis not present

## 2018-04-02 DIAGNOSIS — Z79891 Long term (current) use of opiate analgesic: Secondary | ICD-10-CM | POA: Diagnosis not present

## 2018-04-02 DIAGNOSIS — F1120 Opioid dependence, uncomplicated: Secondary | ICD-10-CM | POA: Diagnosis not present

## 2018-04-02 DIAGNOSIS — Z5181 Encounter for therapeutic drug level monitoring: Secondary | ICD-10-CM | POA: Diagnosis not present

## 2018-04-02 DIAGNOSIS — M79605 Pain in left leg: Secondary | ICD-10-CM | POA: Diagnosis not present

## 2018-04-02 DIAGNOSIS — G9009 Other idiopathic peripheral autonomic neuropathy: Secondary | ICD-10-CM | POA: Diagnosis not present

## 2018-06-19 DIAGNOSIS — R202 Paresthesia of skin: Secondary | ICD-10-CM | POA: Diagnosis not present

## 2018-06-19 DIAGNOSIS — M5416 Radiculopathy, lumbar region: Secondary | ICD-10-CM | POA: Diagnosis not present

## 2018-06-25 DIAGNOSIS — M5416 Radiculopathy, lumbar region: Secondary | ICD-10-CM | POA: Diagnosis not present

## 2018-07-02 DIAGNOSIS — R202 Paresthesia of skin: Secondary | ICD-10-CM | POA: Diagnosis not present

## 2018-07-12 DIAGNOSIS — M25571 Pain in right ankle and joints of right foot: Secondary | ICD-10-CM | POA: Diagnosis not present

## 2018-07-12 DIAGNOSIS — Z716 Tobacco abuse counseling: Secondary | ICD-10-CM | POA: Diagnosis not present

## 2018-07-12 DIAGNOSIS — M79671 Pain in right foot: Secondary | ICD-10-CM | POA: Diagnosis not present

## 2018-07-31 DIAGNOSIS — M47816 Spondylosis without myelopathy or radiculopathy, lumbar region: Secondary | ICD-10-CM | POA: Diagnosis not present

## 2018-07-31 DIAGNOSIS — E1140 Type 2 diabetes mellitus with diabetic neuropathy, unspecified: Secondary | ICD-10-CM | POA: Diagnosis not present

## 2018-07-31 DIAGNOSIS — M5416 Radiculopathy, lumbar region: Secondary | ICD-10-CM | POA: Diagnosis not present

## 2018-07-31 DIAGNOSIS — G894 Chronic pain syndrome: Secondary | ICD-10-CM | POA: Diagnosis not present

## 2018-08-29 DIAGNOSIS — M47816 Spondylosis without myelopathy or radiculopathy, lumbar region: Secondary | ICD-10-CM | POA: Diagnosis not present

## 2018-08-29 DIAGNOSIS — G894 Chronic pain syndrome: Secondary | ICD-10-CM | POA: Diagnosis not present

## 2018-08-29 DIAGNOSIS — M5416 Radiculopathy, lumbar region: Secondary | ICD-10-CM | POA: Diagnosis not present

## 2018-08-29 DIAGNOSIS — E1140 Type 2 diabetes mellitus with diabetic neuropathy, unspecified: Secondary | ICD-10-CM | POA: Diagnosis not present

## 2018-09-24 ENCOUNTER — Telehealth: Payer: Self-pay | Admitting: Family Medicine

## 2018-09-24 NOTE — Telephone Encounter (Signed)
Copied from CRM [id number]. Topic: Quick Communication - Rx Refill/Question >> Sep 24, 2018  2:05 PM Raoul Pitch, Sade R wrote: Medication: lisinopril (PRINIVIL,ZESTRIL) 5 MG tablet  Has the patient contacted their pharmacy?Yes  Preferred Pharmacy (with phone number or street name): [id number] [place], [place] - [address] [phone] (Phone) [phone] (Fax)    Agent: Please be advised that RX refills may take up to 3 business days. We ask that you follow-up with your pharmacy.

## 2018-09-24 NOTE — Telephone Encounter (Signed)
Patient called, line busy, no answer. Will need an appointment.

## 2018-09-24 NOTE — Telephone Encounter (Signed)
Requested medication (s) are due for refill today: Yes  Requested medication (s) are on the active medication list: Yes  Last refill:  By historical provider  Future visit scheduled: No  Notes to clinic:  Unable to refill per protocol, last refilled by another provider, appointment needed     Requested Prescriptions  Pending Prescriptions Disp Refills   lisinopril (PRINIVIL,ZESTRIL) 5 MG tablet      Sig: Take 1 tablet (5 mg total) by mouth daily.     Cardiovascular:  ACE Inhibitors Failed - 09/24/2018  2:46 PM      Failed - Cr in normal range and within 180 days    Creatinine, Ser  Date Value Ref Range Status  07/24/2017 0.97 0.40 - 1.50 mg/dL Final         Failed - K in normal range and within 180 days    Potassium  Date Value Ref Range Status  07/24/2017 4.8 3.5 - 5.1 mEq/L Final         Failed - Valid encounter within last 6 months    Recent Outpatient Visits          1 year ago Chronic fatigue   [facility] conservation officer at [facility] [name], [place], MD   1 year ago Depression, unspecified depression type   Nature conservation officer at [facility], [place], FNP   1 year ago Tobacco use disorder   [facility] conservation officer at [facility] [name], [place], MD   2 years ago Bilateral low back pain with right-sided sciatica   Nature conservation officer at [facility], [place], FNP   2 years ago Chronic fatigue   Nature conservation officer at [place], [place], [place]             Passed - Patient is not pregnant      Passed - Last BP in normal range    BP Readings from Last 1 Encounters:  07/24/17 138/80

## 2018-09-27 ENCOUNTER — Other Ambulatory Visit: Payer: Self-pay | Admitting: *Deleted

## 2018-09-27 ENCOUNTER — Ambulatory Visit: Payer: BLUE CROSS/BLUE SHIELD | Admitting: Internal Medicine

## 2018-09-27 DIAGNOSIS — G894 Chronic pain syndrome: Secondary | ICD-10-CM | POA: Diagnosis not present

## 2018-09-27 DIAGNOSIS — M5416 Radiculopathy, lumbar region: Secondary | ICD-10-CM | POA: Diagnosis not present

## 2018-09-27 DIAGNOSIS — I10 Essential (primary) hypertension: Secondary | ICD-10-CM

## 2018-09-27 DIAGNOSIS — E1140 Type 2 diabetes mellitus with diabetic neuropathy, unspecified: Secondary | ICD-10-CM | POA: Diagnosis not present

## 2018-09-27 DIAGNOSIS — M47816 Spondylosis without myelopathy or radiculopathy, lumbar region: Secondary | ICD-10-CM | POA: Diagnosis not present

## 2018-09-27 MED ORDER — LISINOPRIL 5 MG PO TABS: 5.0000 mg | ORAL_TABLET | Freq: Every day | ORAL | 0 refills | Status: DC

## 2018-09-27 NOTE — Telephone Encounter (Signed)
Patient scheduled follow up with PCP for 10/05/2018, requesting medication below to hold him over until appointment.

## 2018-09-28 ENCOUNTER — Other Ambulatory Visit: Payer: Self-pay | Admitting: *Deleted

## 2018-09-28 DIAGNOSIS — I10 Essential (primary) hypertension: Secondary | ICD-10-CM

## 2018-09-28 NOTE — Telephone Encounter (Signed)
Lisinopril was sent to the pharmacy on 09/27/2018 by Asher Muir, RN. Nothing further needed at this time.

## 2018-09-28 NOTE — Telephone Encounter (Signed)
30 day supply sent by Asher Muir, RN on 09/27/2018. Nothing further needed at this time.

## 2018-10-05 ENCOUNTER — Encounter: Payer: Self-pay | Admitting: Family Medicine

## 2018-10-05 ENCOUNTER — Ambulatory Visit: Payer: Self-pay | Admitting: Family Medicine

## 2018-10-05 VITALS — BP 140/80 | HR 74 | Temp 98.2°F | Resp 12 | Ht 67.0 in | Wt 162.0 lb

## 2018-10-05 DIAGNOSIS — Z720 Tobacco use: Secondary | ICD-10-CM

## 2018-10-05 DIAGNOSIS — R5382 Chronic fatigue, unspecified: Secondary | ICD-10-CM

## 2018-10-05 DIAGNOSIS — M7540 Impingement syndrome of unspecified shoulder: Secondary | ICD-10-CM

## 2018-10-05 DIAGNOSIS — F332 Major depressive disorder, recurrent severe without psychotic features: Secondary | ICD-10-CM

## 2018-10-05 DIAGNOSIS — I10 Essential (primary) hypertension: Secondary | ICD-10-CM

## 2018-10-05 DIAGNOSIS — N529 Male erectile dysfunction, unspecified: Secondary | ICD-10-CM

## 2018-10-05 LAB — BASIC METABOLIC PANEL
BUN: 20 mg/dL (ref 6–23)
CO2: 23 mEq/L (ref 19–32)
Calcium: 9.5 mg/dL (ref 8.4–10.5)
Chloride: 105 mEq/L (ref 96–112)
Creatinine, Ser: 0.92 mg/dL (ref 0.40–1.50)
GFR: 88.76 mL/min (ref >60.00)
Glucose, Bld: 76 mg/dL (ref 70–99)
Potassium: 4.6 mEq/L (ref 3.5–5.1)
Sodium: 138 mEq/L (ref 135–145)

## 2018-10-05 MED ORDER — LISINOPRIL 10 MG PO TABS: 10.0000 mg | ORAL_TABLET | Freq: Every day | ORAL | 2 refills | Status: DC

## 2018-10-05 MED ORDER — NICOTINE 21 MG/24HR TD PT24: 21.0000 mg | MEDICATED_PATCH | Freq: Every day | TRANSDERMAL | 0 refills | Status: DC

## 2018-10-05 MED ORDER — SILDENAFIL CITRATE 100 MG PO TABS: 50.0000 mg | ORAL_TABLET | Freq: Every day | ORAL | 6 refills | Status: DC | PRN

## 2018-10-05 MED ORDER — DICLOFENAC SODIUM 1 % TD GEL: 4.0000 g | Freq: Four times a day (QID) | TRANSDERMAL | 3 refills | Status: DC

## 2018-10-05 NOTE — Progress Notes (Signed)
HPI:   [name] is a 48 y.o. male, who is here today for chronic disease management.Marland Kitchen   He was last seen on 07/24/2017. Today he needs refills for lisinopril 10 mg, which he has not taken for about 4 months. He is not checking BP at home. Denies severe/frequent headache, visual changes, chest pain, dyspnea, palpitation, claudication, focal weakness, or edema.   Lab Results  Component Value Date   CREATININE 0.97 07/24/2017   BUN 15 07/24/2017   NA 142 07/24/2017   K 4.8 07/24/2017   CL 106 07/24/2017   CO2 29 07/24/2017    Complaining about fatigue. Depression, currently he is on sertraline 50 mg daily, which he has taken for years.  States that he is not sure if medication is helping, he "forgot" how severe symptoms were before restarted medication. He denies suicidal thoughts, he has history of suicidal attempts. He lives with his wife and 4 kids.  Depression screen [facility] 2/9 10/05/2018 07/24/2017 02/23/2017 01/25/2016 11/23/2015  Decreased Interest 0 1  Down, Depressed, Hopeless PHQ - 2 Score Altered sleeping - 0  Tired, decreased energy - 1  Change in appetite - 0  Feeling bad or failure about yourself  - 1  Trouble concentrating 1 0 0 - 0  Moving slowly or fidgety/restless 0 2 0 - 0  Suicidal thoughts 1 0 0 - 0  PHQ-9 Score - 4  Difficult doing work/chores Somewhat difficult - - - -    GAD 7 : Generalized Anxiety Score 10/05/2018  Nervous, Anxious, on Edge 0  Control/stop worrying 1  Worry too much - different things 1  Trouble relaxing 1  Restless 2  Easily annoyed or irritable 2  Afraid - awful might happen 0  Total GAD 7 Score 7  Anxiety Difficulty Somewhat difficult     Problem aggravated by health issues. S/P kidney/pancreas transplant in 2014. Since surgery he has not needed treatment for DM 1. Residual lower extremities pain, numbness, and burning. Sometimes burning feet  sensation interfere with his sleep.  He is still smoking, he is trying to cut down. He has tried Chantix in the past.  He is also complaining about difficulty falling asleep. Problem aggravated by pain. He follows with pain clinic.  He has tried OTC melatonin and it seems to help, he sleeps through the night. He wants to be sure if he is okay to continue melatonin.  ED: Currently he is on Viagra 100 mg daily, which has helped. He denies significant side effects, except for nasal congestion.  Bilateral shoulder pain that is started about 6 months ago. It is exacerbated by certain movements and by sleeping on his side at night. No history of trauma. He has not noted edema or erythema. No limitation of range of motion.     Review of Systems  Constitutional: Positive for fatigue. Negative for activity change, appetite change and fever.  HENT: Negative for nosebleeds and sore throat.   Eyes: Negative for redness and visual disturbance.  Respiratory: Negative for cough, shortness of breath and wheezing.   Cardiovascular: Negative for chest pain, palpitations and leg swelling.  Gastrointestinal: Negative for abdominal pain, nausea and vomiting.  Genitourinary: Negative for decreased urine volume, dysuria and hematuria.  Musculoskeletal: Positive for arthralgias. Negative for gait problem.  Skin:  Negative for pallor and rash.  Neurological: Negative for weakness and headaches.  Psychiatric/Behavioral: Positive for sleep disturbance. Negative for confusion. The patient is nervous/anxious.     Current Outpatient Medications on File Prior to Visit  Medication Sig Dispense Refill  . amitriptyline (ELAVIL) 25 MG tablet TAKE 1 TABLET BY MOUTH AT BEDTIME (STOP CYMBALTA)    . docusate sodium (COLACE) 100 MG capsule Take by mouth.    . mycophenolate (MYFORTIC) 180 MG EC tablet Take 180 mg by mouth 2 (two) times daily.    . Oxycodone HCl 10 MG TABS TAKE 1 TABLET BY MOUTH EVERY 6 HOURS AS  NEEDED FOR PAIN (STOP OXYCODONE 5MG     . tacrolimus (PROGRAF) 1 MG capsule Take 1 mg by mouth. Pt take 5 in the morning and 4 at night    . [DISCONTINUED] diltiazem (CARDIZEM) 30 MG tablet Take 30 mg by mouth 4 (four) times daily. Ask patient to verify dosage and frequency. Not indicated on medical history form dated 10/23/10.     . [DISCONTINUED] insulin lispro (HUMALOG) 100 UNIT/ML injection Inject into the skin 3 (three) times daily before meals. Ask patient to verify name/dosage/. History form noted dosage of 34 units during day and 22 units at night.      No current facility-administered medications on file prior to visit.      Past Medical History:  Diagnosis Date  . Depression   . Diabetes mellitus   . Diabetic peripheral neuropathy (HCC) 11/24/2015  . Hypercholesterolemia   . Hypertension   . Migraines   . Renal disorder    Allergies  Allergen Reactions  . Other     Social History   Socioeconomic History  . Marital status: Married    Spouse name: Not on file  . Number of children: 4  . Years of education: 10  . Highest education level: Not on file  Occupational History  . Occupation: [employer]  . Financial resource strain: Not on file  . Food insecurity:    Worry: Not on file    Inability: Not on file  . Transportation needs:    Medical: Not on file    Non-medical: Not on file  Tobacco Use  . Smoking status: Current Every Day Smoker    Packs/day: 1.00    Years: 22.00    Pack years: 22.00    Types: Cigarettes  . Smokeless tobacco: Never Used  Substance and Sexual Activity  . Alcohol use: Yes    Alcohol/week: 0.0 standard drinks    Comment: socially  . Drug use: No  . Sexual activity: Not on file  Lifestyle  . Physical activity:    Days per week: Not on file    Minutes per session: Not on file  . Stress: Not on file  Relationships  . Social connections:    Talks on phone: Not on file    Gets together: Not on file    Attends religious  service: Not on file    Active member of club or organization: Not on file    Attends meetings of clubs or organizations: Not on file    Relationship status: Not on file  Other Topics Concern  . Not on file  Social History Narrative   Paints houses   Wife works at a Retail banker at [employer]   4 children age 10 to 86   Regular exercise-no current exercise regimen   Caffeine Use-no    Vitals:   10/05/18 0824  BP:  140/80  Pulse: 74  Resp: 12  Temp: 98.2 F (36.8 C)  SpO2: 95%   Body mass index is 25.37 kg/m.  Physical Exam  Nursing note and vitals reviewed. Constitutional: He is oriented to person, place, and time. He appears well-developed and well-nourished. No distress.  HENT:  Head: Normocephalic and atraumatic.  Mouth/Throat: Oropharynx is clear and moist and mucous membranes are normal.  Eyes: Pupils are equal, round, and reactive to light. Conjunctivae are normal.  Cardiovascular: Normal rate and regular rhythm.  Murmur (soft SEM LUSB) heard. Pulses:      Dorsalis pedis pulses are 2+ on the right side and 2+ on the left side.  Respiratory: Effort normal and breath sounds normal. No respiratory distress.  GI: Soft. She exhibits no mass. There is no hepatomegaly. There is no abdominal tenderness.  Musculoskeletal:        General: No edema.     Comments: Bilateral shoulder: No deformity, edema, or erythema appreciated.No muscle atrophy. Juanetta Gosling' test neg bilateral, empty can supraspinatus test neg R, mildly pos L, cross body adduction test neg bilateral, lift-Off Subscapularis test pos left. ROM normal,left elicits pain.Marland Kitchen  Lymphadenopathy:    She has no cervical adenopathy.  Neurological: She is alert and oriented to person, place, and time. She has normal strength. No cranial nerve deficit. Gait normal.  Skin: Skin is warm. No rash noted. No erythema.  Psychiatric: His mood appears anxious. He expresses no suicidal ideation. He expresses no suicidal plans.  Well groomed,  good eye contact.     ASSESSMENT AND PLAN:   Mr. Kerem Domanico was seen today for chronic disease management.  Orders Placed This Encounter  Procedures  . Basic metabolic panel  . Ambulatory referral to Sports Medicine    Lab Results  Component Value Date   CREATININE 0.92 10/05/2018   BUN 20 10/05/2018   NA 138 10/05/2018   K 4.6 10/05/2018   CL 105 10/05/2018   CO2 23 10/05/2018    Chronic fatigue We discussed possible etiologies: Systemic illness, immunologic,endocrinology,sleep disorder, psychiatric/psychologic, infectious,medications side effects, and idiopathic. Examination today does not suggest a serious process. Healthy diet and regular physical activity may help.  Impingement syndrome of shoulder region, unspecified laterality  He is not interested in PT,would like referral to ortho. Topical Voltaren qid prn.  - Plan: diclofenac sodium (VOLTAREN) 1 % GEL, Ambulatory referral to Sports Medicine  Erectile dysfunction, unspecified erectile dysfunction type  Medication has helped. Some side effects discussed.  - Plan: sildenafil (VIAGRA) 100 MG tablet  Severe episode of recurrent major depressive disorder, without psychotic features (HCC) We discussed other pharmacologic options. No suicidal.  He prefers to continue same medication. Information about psychotherapy given,so he can arrange appt. Instructed about warning signs. F/U in 4 months,before if needed.  Tobacco abuse  Adverse effects of tobacco use and benefits of smoking cessation. He agrees with trying Nicotine patches,side effects discussed.  - Plan: nicotine (NICODERM CQ - DOSED IN MG/24 HOURS) 21 mg/24hr patch  Hypertension, unspecified type  Slightly elevated. Resume Lisinopril 10 mg daily.  DASH and low salt diet recommended. Eye exam recommended annually. F/U in 6 months, before if needed.  - Plan: Basic metabolic panel, lisinopril (PRINIVIL,ZESTRIL) 10 MG tablet     Return in  about 4 months (around 02/03/2019) for HTN,anxiety,depression..       G. [name], MD  [facility]. [place] office.

## 2018-10-05 NOTE — Patient Instructions (Signed)
A few things to remember from today's visit:   Severe episode of recurrent major depressive disorder, without psychotic features (HCC)  Chronic fatigue  Tobacco abuse - Plan: nicotine (NICODERM CQ - DOSED IN MG/24 HOURS) 21 mg/24hr patch  Erectile dysfunction, unspecified erectile dysfunction type - Plan: sildenafil (VIAGRA) 100 MG tablet  Impingement syndrome of shoulder region, unspecified laterality - Plan: diclofenac sodium (VOLTAREN) 1 % GEL, Ambulatory referral to Sports Medicine  Hypertension, unspecified type   Please be sure medication list is accurate. If a new problem present, please set up appointment sooner than planned today.

## 2018-10-07 ENCOUNTER — Encounter: Payer: Self-pay | Admitting: Family Medicine

## 2018-10-11 ENCOUNTER — Encounter: Payer: Self-pay | Admitting: Sports Medicine

## 2018-10-11 ENCOUNTER — Ambulatory Visit: Payer: BLUE CROSS/BLUE SHIELD | Admitting: Sports Medicine

## 2018-10-11 VITALS — BP 140/80 | HR 70 | Ht 67.0 in | Wt 165.8 lb

## 2018-10-11 DIAGNOSIS — G2589 Other specified extrapyramidal and movement disorders: Secondary | ICD-10-CM

## 2018-10-11 DIAGNOSIS — M75102 Unspecified rotator cuff tear or rupture of left shoulder, not specified as traumatic: Secondary | ICD-10-CM | POA: Diagnosis not present

## 2018-10-11 DIAGNOSIS — M75101 Unspecified rotator cuff tear or rupture of right shoulder, not specified as traumatic: Secondary | ICD-10-CM

## 2018-10-11 NOTE — Progress Notes (Signed)
Mark Miranda. [name] [phone]  Mark Miranda - 48 y.o. male MRN [id number]  Date of birth: [birth year]  Visit Date: October 14, 2018  PCP: [name], [name], MD   Referred by: [name], [name], MD  SUBJECTIVE:  Chief Complaint  Patient presents with  . New Patient (Initial Visit)    B shoulder pain    HPI: Bilateral shoulder pain is been present for the last 5 to 6 months.  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  Is worse on the right than it is the left.  He has no numbness or tingling.  Worsening by sleeping on his side.  Shoulder abduction and functional internal rotation seems to be helpful.  He has tried oxycodone for this with some improvement.  This is actually keeping him awake at night  REVIEW OF SYSTEMS: Denies fevers, chills, recent weight gain or weight loss.  No night sweats.  Per HPI Otherwise 12 point review of systems performed and is negative   HISTORY:  Prior history reviewed and updated per electronic medical record.  Patient Active Problem List   Diagnosis Date Noted  . Erectile dysfunction 07/24/2017  . Vitamin D deficiency, unspecified 07/24/2017  . Low back pain 02/25/2016  . Chronic fatigue 01/25/2016  . Diabetic peripheral neuropathy (HCC) 11/24/2015  . 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  . Depression 11/02/2015  . Tobacco abuse 11/02/2015  . End stage renal disease (HCC) 12/29/2011  . HTN (hypertension) 01/25/2011   Social History   Occupational History  . Occupation: Painter  Tobacco Use  . Smoking status: Current Every Day Smoker    Packs/day: 1.00    Years: 22.00    Pack years: 22.00    Types: Cigarettes  . Smokeless tobacco: Never Used  Substance and Sexual Activity  . Alcohol use: Yes    Alcohol/week: 0.0 standard drinks    Comment: socially  . Drug use: No  .  Sexual activity: Not on file   Social History   Social History Narrative   Paints houses   Wife works at a Retail banker at [employer]   4 children age 31 to 67   Regular exercise-no current exercise regimen   Caffeine Use-no   Past Medical History:  Diagnosis Date  . Depression   . Diabetes mellitus   . Diabetic peripheral neuropathy (HCC) 11/24/2015  . Hypercholesterolemia   . Hypertension   . Migraines   . Renal disorder    Past Surgical History:  Procedure Laterality Date  . AV FISTULA PLACEMENT  01/10/2012   Procedure: ARTERIOVENOUS (AV) FISTULA CREATION;  Surgeon: Sherren Kerns, MD;  Location: [facility] OR;  Service: Vascular;  Laterality: Left;  Left Brachial Cephalic Arteriovenous Fistula  . CAPD INSERTION  01/09/2012   Procedure: CONTINUOUS AMBULATORY PERITONEAL DIALYSIS  (CAPD) CATHETER INSERTION;  Surgeon: Ernestene Mention, MD;  Location: [facility] OR;  Service: General;  Laterality: N/A;  Exteriorization PD Cath  . INSERTION OF DIALYSIS CATHETER  01/13/2012   Procedure: INSERTION OF DIALYSIS CATHETER;  Surgeon: Larina Earthly, MD;  Location: [facility] OR;  Service: Vascular;  Laterality: Right;  Internal Jugular  . KIDNEY TRANSPLANT    . LAPAROSCOPY  03/13/2012   Procedure: LAPAROSCOPY DIAGNOSTIC;  Surgeon: Ardeth Sportsman, MD;  Location: [facility] OR;  Service: General;  Laterality: N/A;  . PERITONEAL CATHETER INSERTION  family history includes Cancer in his father; Diabetes in his mother; Hyperlipidemia in his mother; Hypertension in his mother.  OBJECTIVE:  VS:  HT:5\' 7"  (170.2 cm)   WT:165 lb 12.8 oz (75.2 kg)  BMI:25.96    BP:140/80  HR:70bpm  TEMP: ( )  RESP:96 %   PHYSICAL EXAM: CONSTITUTIONAL: Well-developed, Well-nourished and In no acute distress EYES: Pupils are equal., EOM intact without nystagmus. and No scleral icterus. Psychiatric: Alert & appropriately interactive. and Not depressed or anxious appearing. EXTREMITY EXAM: Warm and well perfused  Bilateral shoulders with marked  anterior chain carriage.  He has a positive Hawkins and Neer's bilaterally.  Intrinsic rotator cuff strength testing is 5+/5 with internal rotation, external rotation, empty can testing, speeds testing, O'Brien's testing.  He does have some pain with empty can testing and speeds testing but none with O'Brien's.  There is scapular dyskinesis appreciated.  ASSESSMENT:   1. Rotator cuff syndrome of right shoulder   2. Rotator cuff syndrome of left shoulder   3. Scapular dyskinesis     PROCEDURES:  PROCEDURE NOTE: Landmark Guided: Injection: Bilateral shoulder, Subacromial  DESCRIPTION OF PROCEDURE:  The patient's clinical condition is marked by substantial pain and/or significant functional disability. Other conservative therapy has not provided relief, is contraindicated, or not appropriate. There is a reasonable likelihood that injection will significantly improve the patient's pain and/or functional impairment.  After discussing the risks, benefits and expected outcomes of the injection and all questions were reviewed and answered, the patient wished to undergo the above named procedure. Verbal consent was obtained. The skin was then prepped in sterile fashion and the target structure was injected as below:  Bilateral injections performed under identical technique as below:  PREP: Alcohol and Ethel Chloride  APPROACH: posterior, single injection, 22g 1.5 in.  INJECTATE: 3 cc 0.5% Marcaine and 1 cc 40mg /mL DepoMedrol  DRESSING: Band-Aid  Post procedural instructions including recommending icing and warning signs for infection were reviewed.  This procedure was well tolerated and there were no complications.    PROCEDURE NOTE: THERAPEUTIC EXERCISES [phone])   Discussed the foundation of treatment for this condition is physical therapy and/or daily (5-6 days/week) therapeutic exercises, focusing on core strengthening, coordination, neuromuscular control/reeducation. 15 minutes spent for  Therapeutic exercises as below and as referenced in the AVS. This included exercises focusing on stretching, strengthening, with significant focus on eccentric aspects.  Proper technique shown and discussed handout in great detail with ATC. All questions were discussed and answered.   Long term goals include an improvement in range of motion, strength, endurance as well as avoiding reinjury. Frequency of visits is one time as determined during today's office visit. Frequency of exercises to be performed is as per handout.  EXERCISES REVIEWED:   Intrinsic Rotator Cuff Exercises Scapular Stabilization      PLAN:  Pertinent additional documentation may be included in corresponding procedure notes, imaging studies, problem based documentation and patient instructions.  No problem-specific Assessment & Plan notes found for this encounter.   Fairly classic bilateral rotator cuff syndrome with impingement.  Landmark guided injections performed today.  Home Therapeutic exercises prescribed today per procedure note.  Activity modifications and the importance of avoiding exacerbating activities (limiting pain to no more than a 4 / 10 during or following activity) recommended and discussed.  Discussed red flag symptoms that warrant earlier emergent evaluation and patient voices understanding.  Lab Orders  No laboratory test(s) ordered today   Imaging Orders  No imaging studies ordered  today   Referral Orders  No referral(s) requested today    At follow up will plan to consider: initial X-rays of bilateral shoulders/neck  Return in about 6 weeks (around 11/22/2018).          Andrena Mews, DO    [facility] Phys[facility]

## 2018-10-11 NOTE — Patient Instructions (Addendum)

## 2018-10-12 ENCOUNTER — Telehealth: Payer: Self-pay | Admitting: *Deleted

## 2018-10-12 NOTE — Telephone Encounter (Signed)
Prior auth for Diclofenac sodium 1% gel sent to [email or web address]-key [id number].

## 2018-10-15 ENCOUNTER — Ambulatory Visit: Payer: Self-pay | Admitting: Family Medicine

## 2018-10-16 NOTE — Telephone Encounter (Signed)
FYI given to Dr. [name].

## 2018-10-16 NOTE — Telephone Encounter (Signed)
Fax received from [employer] stating the request was denied and this was given to Dr Jordan's asst. 

## 2018-10-25 DIAGNOSIS — E1140 Type 2 diabetes mellitus with diabetic neuropathy, unspecified: Secondary | ICD-10-CM | POA: Diagnosis not present

## 2018-10-25 DIAGNOSIS — G894 Chronic pain syndrome: Secondary | ICD-10-CM | POA: Diagnosis not present

## 2018-10-25 DIAGNOSIS — M47816 Spondylosis without myelopathy or radiculopathy, lumbar region: Secondary | ICD-10-CM | POA: Diagnosis not present

## 2018-10-25 DIAGNOSIS — M5416 Radiculopathy, lumbar region: Secondary | ICD-10-CM | POA: Diagnosis not present

## 2018-11-22 DIAGNOSIS — M5416 Radiculopathy, lumbar region: Secondary | ICD-10-CM | POA: Diagnosis not present

## 2018-11-22 DIAGNOSIS — E1140 Type 2 diabetes mellitus with diabetic neuropathy, unspecified: Secondary | ICD-10-CM | POA: Diagnosis not present

## 2018-11-22 DIAGNOSIS — M47816 Spondylosis without myelopathy or radiculopathy, lumbar region: Secondary | ICD-10-CM | POA: Diagnosis not present

## 2018-11-22 DIAGNOSIS — G894 Chronic pain syndrome: Secondary | ICD-10-CM | POA: Diagnosis not present

## 2018-12-03 ENCOUNTER — Ambulatory Visit: Payer: BLUE CROSS/BLUE SHIELD | Admitting: Sports Medicine

## 2018-12-03 ENCOUNTER — Encounter: Payer: Self-pay | Admitting: Sports Medicine

## 2018-12-03 ENCOUNTER — Other Ambulatory Visit: Payer: Self-pay

## 2018-12-03 VITALS — BP 130/68 | HR 94 | Temp 98.5°F | Ht 67.0 in | Wt 161.4 lb

## 2018-12-03 DIAGNOSIS — G8929 Other chronic pain: Secondary | ICD-10-CM

## 2018-12-03 DIAGNOSIS — G2589 Other specified extrapyramidal and movement disorders: Secondary | ICD-10-CM | POA: Diagnosis not present

## 2018-12-03 DIAGNOSIS — M75102 Unspecified rotator cuff tear or rupture of left shoulder, not specified as traumatic: Secondary | ICD-10-CM

## 2018-12-03 DIAGNOSIS — M545 Low back pain, unspecified: Secondary | ICD-10-CM

## 2018-12-03 DIAGNOSIS — M75101 Unspecified rotator cuff tear or rupture of right shoulder, not specified as traumatic: Secondary | ICD-10-CM | POA: Diagnosis not present

## 2018-12-03 DIAGNOSIS — M7540 Impingement syndrome of unspecified shoulder: Secondary | ICD-10-CM

## 2018-12-03 DIAGNOSIS — M47816 Spondylosis without myelopathy or radiculopathy, lumbar region: Secondary | ICD-10-CM

## 2018-12-03 NOTE — Patient Instructions (Addendum)
Also check out [employer]" which is a program developed by Dr. Myles Lipps.   There are links to a couple of his YouTube Videos below and I would like to see you performing one of his videos 5-6 days per week.  It is best to do these exercises first thing in the morning.  They will give you a good jumpstart here today and start normalizing the way you move.  Good intro videos: "Independence from Pain 7-minute Video" - [email or web address]      Introduction to [employer]     How to get relief with Sitting   A more advanced video is: Scientist, research (medical) original 12 minutes" - [email or web address]  Exercises that focus more on the neck are as below: Dr. Derrill Kay with [employer] teaching neck and shoulder details Part 1 - https://youtu.be/[email or web address] Part 2 Dr. Derrill Kay with [facility] quick routine to practice daily - https://youtu.be/[email or web address]  Do not try to attempt the entire video when first beginning.  Try breaking of each exercise that he goes into shorter segments.  In other words, if they perform an exercise for 45 seconds, start with 15 seconds and rest and then resume when they begin the new activity.  If you work your way up to being able to do these videos without having to stop, I expect you will see significant improvements in your pain.  If you enjoy his videos and would like to find out more you can look on his website: [email or web address].  He has a workout streaming option as well as a DVD set available for purchase.  Amazon has the best price for his DVDs.     Please perform the exercise program that we have prepared for you and gone over in detail on a daily basis.  In addition to the handout you were provided you can access your program through: [email or web address]   Your unique program code is:  [email or web address]

## 2018-12-03 NOTE — Progress Notes (Signed)
Mark Miranda. Mark Miranda [facility] at [facility] [phone]  Mark Miranda - 48 y.o. male MRN [id number]  Date of birth: [birth year]  Visit Date: December 03, 2018  PCP: [name], [name], MD   Referred by: [name], [name], MD  SUBJECTIVE:   Chief Complaint  Patient presents with  . Follow-up    B shoulder pain R>L.  B ACJ injections - 10/11/18.  HEP of scap stab and RC    HPI: Patient is here for follow-up of bilateral shoulder pains after injections on 10/11/2018.  He is done quite well with these and reports 70 to 80% improvement in his bilateral shoulders and is no longer having nighttime disturbances due to these issues.  He also reports bilateral low back pain that is nonradiating.  Previously had pain that radiated into his calves.  He has tried physical therapy, chiropractic oxycodone without significant improvement.  Pain is worse with prolonged standing.  REVIEW OF SYSTEMS: No significant nighttime awakenings due to this issue. Denies fevers, chills, recent weight gain or weight loss.  No night sweats.  Pt denies any change in bowel or bladder habits, muscle weakness, numbness or falls associated with this pain.  HISTORY:  Prior history reviewed and updated per electronic medical record.  Patient Active Problem List   Diagnosis Date Noted  . Erectile dysfunction 07/24/2017  . Vitamin D deficiency, unspecified 07/24/2017  . Low back pain 02/25/2016  . Chronic fatigue 01/25/2016  . Diabetic peripheral neuropathy (HCC) 11/24/2015  . 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  . Depression 11/02/2015  . Tobacco abuse 11/02/2015  . End stage renal disease (HCC) 12/29/2011  . HTN (hypertension) 01/25/2011   Social History   Occupational History  . Occupation: Painter  Tobacco Use  . Smoking status: Current Every Day Smoker    Packs/day: 1.00    Years: 22.00    Pack years: 22.00    Types: Cigarettes  .  Smokeless tobacco: Never Used  Substance and Sexual Activity  . Alcohol use: Yes    Alcohol/week: 0.0 standard drinks    Comment: socially  . Drug use: No  . Sexual activity: Not on file   Social History   Social History Narrative   Paints houses   Wife works at a Retail banker at [employer]   4 children age 58 to 2   Regular exercise-no current exercise regimen   Caffeine Use-no    OBJECTIVE:  VS:  HT:5\' 7"  (170.2 cm)   WT:161 lb 6.4 oz (73.2 kg)  BMI:25.27    BP:130/68  HR:94bpm  TEMP:98.5 F (36.9 C)( )  RESP:97 %   PHYSICAL EXAM: Adult male. No acute distress.  Alert and appropriate. Bilateral shoulders with overhead full range of motion.  His intrinsic rotator cuff strength is intact.  No focal pain over the [facility] joint.  He has negative Hawkins negative Neer's testing.  His low back is overall well aligned.  He has poor hip abduction strength at 4/5 bilaterally with TFL predominant recruitment pattern.  He has good internal and external rotation of bilateral hips.  Negative Stinchfield test negative logroll test.  His bilateral lower extremity sensation is intact light touch.  Lower extremity reflexes are 2+/4 in bilateral Achilles and patella.   ASSESSMENT:   1. Chronic bilateral low back pain without sciatica   2. Rotator cuff syndrome of left shoulder   3. Rotator cuff syndrome of right shoulder   4. Scapular  dyskinesis   5. Impingement syndrome of shoulder region, unspecified laterality   6. Spondylosis of lumbar region without myelopathy or radiculopathy     PROCEDURES:  PROCEDURE NOTE: THERAPEUTIC EXERCISES ([id number])  Discussed the foundation of treatment for this condition is physical therapy and/or daily (5-6 days/week) therapeutic exercises, focusing on core strengthening, coordination, neuromuscular control/reeducation. 15 minutes spent for Therapeutic exercises as below and as referenced in the AVS. This included exercises focusing on stretching, strengthening, with  significant focus on eccentric aspects.  Proper technique shown and discussed handout in great detail with ATC. All questions were discussed and answered.  Long term goals include an improvement in range of motion, strength, endurance as well as avoiding reinjury. Frequency of visits is one time as determined during today's office visit. Frequency of exercises to be performed is as per handout. EXERCISES REVIEWED: Mark KayGoodman Exercises Hip ABduction strengthening with focus on Glute Medius Recruitment Hip flexor stretching     PLAN:  Pertinent additional documentation may be included in corresponding procedure notes, imaging studies, problem based documentation and patient instructions.  No problem-specific Assessment & Plan notes found for this encounter.   Functional pain mainly.  No red flag symptoms.  IT sales professionaloundations Training Program: These exercises were developed by Mark LippsEric Miranda, DC with a strong emphasis on core neuromuscular reducation and postural realignment through body-weight exercises. Daily practice encouarged and links to [employer] Training videos provided today per Patient Instructions.   Home Therapeutic Exercises: New program prescribed today per procedure note.  Discussed the underlying features of tight hip flexors leading to crouched, fetal like position that results in spinal column compression.  Including lumbar hyperflexion with hypermobility, thoracic flexion with restrictive rotation and cervical lordosis reversal.    Activity modifications and the importance of avoiding exacerbating activities (limiting pain to no more than a 4 / 10 during or following activity) recommended and discussed.   Discussed red flag symptoms that warrant earlier emergent evaluation and patient voices understanding.    No orders of the defined types were placed in this encounter.  Lab Orders  No laboratory test(s) ordered today   Imaging Orders  No imaging studies ordered today    Referral Orders  No referral(s) requested today    Return if symptoms worsen or fail to improve.          Andrena MewsMichael D Rigby, DO    [place] [facility] Physician

## 2018-12-06 ENCOUNTER — Ambulatory Visit: Payer: BLUE CROSS/BLUE SHIELD | Admitting: Sports Medicine

## 2018-12-21 DIAGNOSIS — G894 Chronic pain syndrome: Secondary | ICD-10-CM | POA: Diagnosis not present

## 2018-12-21 DIAGNOSIS — E1140 Type 2 diabetes mellitus with diabetic neuropathy, unspecified: Secondary | ICD-10-CM | POA: Diagnosis not present

## 2018-12-21 DIAGNOSIS — M5416 Radiculopathy, lumbar region: Secondary | ICD-10-CM | POA: Diagnosis not present

## 2018-12-21 DIAGNOSIS — M47816 Spondylosis without myelopathy or radiculopathy, lumbar region: Secondary | ICD-10-CM | POA: Diagnosis not present

## 2019-02-12 ENCOUNTER — Encounter: Payer: Self-pay | Admitting: Family Medicine

## 2019-02-12 ENCOUNTER — Other Ambulatory Visit: Payer: Self-pay

## 2019-02-12 ENCOUNTER — Ambulatory Visit (INDEPENDENT_AMBULATORY_CARE_PROVIDER_SITE_OTHER): Payer: BC Managed Care – PPO | Admitting: Family Medicine

## 2019-02-12 DIAGNOSIS — R42 Dizziness and giddiness: Secondary | ICD-10-CM

## 2019-02-12 DIAGNOSIS — H9190 Unspecified hearing loss, unspecified ear: Secondary | ICD-10-CM

## 2019-02-12 NOTE — Progress Notes (Signed)
Virtual Visit via Telephone Note  I connected with Mark Miranda on 02/12/19 at  8:00 AM EDT by telephone and verified that I am speaking with the correct person using two identifiers. We could not do video visit because technical difficulties.   I discussed the limitations, risks, security and privacy concerns of performing an evaluation and management service by telephone and the availability of in person appointments. I also discussed with the patient that there may be a patient responsible charge related to this service. The patient expressed understanding and agreed to proceed.  Location patient: home Location provider: home office Participants present for the call: patient, provider Patient did not have a visit in the prior 7 days to address this/these issue(s).  Chief Complaint  Patient presents with  . Dizziness    when the patietn goes up the ladder at work he feels dizzy/hearing loss in both hears going in and out and a little nausea..    History of Present Illness: Mark Miranda is a 48 yo male with Hx of depression,chronic pain on opioid treatment,s/p kidney and pancreatic transplant who is c/o dizziness.  Yesterday morning while he was going up a ladder he felt "pressure" in his head and decreased hearing + mild nausea. He had a few episodes every time he went up the ladder, each episode lasted about 15-30 seconds. States that he did not have the perception of movement or unbalance.   He is not sure about exacerbating factors but he felt better after eating.  Negative for new medications or recent URI. No history of travel.  He denies having similar episodes in the past. Negative for fever, chills, unusual fatigue, headache, visual changes, ear ache, sore throat, tinnitus, nasal congestion, chest pain, palpitation, cough, wheezing, dyspnea, abdominal pain, vomiting, changes in bowel habits, urinary symptoms, or focal weakness.  Today he has not had any episodes. He did not  try any OTC medication.  Observations/Objective: Patient sounds cheerful and well on the phone. I do not appreciate any SOB. Speech and thought processing are grossly intact.  + Anxious. Patient reported vitals:N/A  Assessment and Plan:  1. Hearing loss, unspecified hearing loss type, unspecified laterality Transient episode, states that hearing is back to his normal. We discussed possible etiologies, ? eustachian tube dysfunction among some. Hx does not suggest a serious process and since problem has resolved I do not think ENT evaluation is needed at this time. He was clearly instructed about warning signs and he voices understanding.  2. Dizziness, nonspecific He described problem as dizziness,resolved. Adequate hydration and avoid skipping meals. Avoid highs or climbing ladders for a week. Instructed about warning signs. I do not think further work up is needed at this time.  Follow Up Instructions: Fall precautions discussed. He will monitor for new symptoms. If problem presents again, will need to see him in the clinic.  I did not refer this patient for an OV in the next 24 hours for this/these issue(s).  I discussed the assessment and treatment plan with the patient. He was provided an opportunity to ask questions and all were answered. He agreed with the plan and demonstrated an understanding of the instructions.   The patient was advised to call back or seek an in-person evaluation if the symptoms worsen or if the condition fails to improve as anticipated.  I provided 18 minutes of non-face-to-face time during this encounter.   Mark Cahalan Martinique, MD

## 2019-02-15 DIAGNOSIS — E1140 Type 2 diabetes mellitus with diabetic neuropathy, unspecified: Secondary | ICD-10-CM | POA: Diagnosis not present

## 2019-02-15 DIAGNOSIS — M47816 Spondylosis without myelopathy or radiculopathy, lumbar region: Secondary | ICD-10-CM | POA: Diagnosis not present

## 2019-02-15 DIAGNOSIS — M5416 Radiculopathy, lumbar region: Secondary | ICD-10-CM | POA: Diagnosis not present

## 2019-02-15 DIAGNOSIS — G894 Chronic pain syndrome: Secondary | ICD-10-CM | POA: Diagnosis not present

## 2019-03-20 DIAGNOSIS — M47816 Spondylosis without myelopathy or radiculopathy, lumbar region: Secondary | ICD-10-CM | POA: Diagnosis not present

## 2019-03-20 DIAGNOSIS — M5416 Radiculopathy, lumbar region: Secondary | ICD-10-CM | POA: Diagnosis not present

## 2019-03-20 DIAGNOSIS — E1140 Type 2 diabetes mellitus with diabetic neuropathy, unspecified: Secondary | ICD-10-CM | POA: Diagnosis not present

## 2019-03-20 DIAGNOSIS — G894 Chronic pain syndrome: Secondary | ICD-10-CM | POA: Diagnosis not present

## 2019-04-02 DIAGNOSIS — Z940 Kidney transplant status: Secondary | ICD-10-CM | POA: Diagnosis not present

## 2019-04-02 DIAGNOSIS — N182 Chronic kidney disease, stage 2 (mild): Secondary | ICD-10-CM | POA: Diagnosis not present

## 2019-04-02 DIAGNOSIS — I10 Essential (primary) hypertension: Secondary | ICD-10-CM | POA: Diagnosis not present

## 2019-04-02 DIAGNOSIS — D631 Anemia in chronic kidney disease: Secondary | ICD-10-CM | POA: Diagnosis not present

## 2019-04-03 DIAGNOSIS — N189 Chronic kidney disease, unspecified: Secondary | ICD-10-CM | POA: Diagnosis not present

## 2019-04-03 DIAGNOSIS — Z940 Kidney transplant status: Secondary | ICD-10-CM | POA: Diagnosis not present

## 2019-04-03 DIAGNOSIS — N182 Chronic kidney disease, stage 2 (mild): Secondary | ICD-10-CM | POA: Diagnosis not present

## 2019-04-03 DIAGNOSIS — E212 Other hyperparathyroidism: Secondary | ICD-10-CM | POA: Diagnosis not present

## 2019-04-03 DIAGNOSIS — Z9483 Pancreas transplant status: Secondary | ICD-10-CM | POA: Diagnosis not present

## 2019-04-19 ENCOUNTER — Telehealth (INDEPENDENT_AMBULATORY_CARE_PROVIDER_SITE_OTHER): Payer: BC Managed Care – PPO | Admitting: Family Medicine

## 2019-04-19 ENCOUNTER — Encounter: Payer: Self-pay | Admitting: Family Medicine

## 2019-04-19 ENCOUNTER — Other Ambulatory Visit: Payer: Self-pay

## 2019-04-19 DIAGNOSIS — K219 Gastro-esophageal reflux disease without esophagitis: Secondary | ICD-10-CM | POA: Diagnosis not present

## 2019-04-19 DIAGNOSIS — R42 Dizziness and giddiness: Secondary | ICD-10-CM

## 2019-04-19 DIAGNOSIS — E1140 Type 2 diabetes mellitus with diabetic neuropathy, unspecified: Secondary | ICD-10-CM | POA: Diagnosis not present

## 2019-04-19 DIAGNOSIS — M5416 Radiculopathy, lumbar region: Secondary | ICD-10-CM | POA: Diagnosis not present

## 2019-04-19 DIAGNOSIS — R1013 Epigastric pain: Secondary | ICD-10-CM

## 2019-04-19 DIAGNOSIS — I10 Essential (primary) hypertension: Secondary | ICD-10-CM

## 2019-04-19 DIAGNOSIS — G894 Chronic pain syndrome: Secondary | ICD-10-CM | POA: Diagnosis not present

## 2019-04-19 DIAGNOSIS — M47816 Spondylosis without myelopathy or radiculopathy, lumbar region: Secondary | ICD-10-CM | POA: Diagnosis not present

## 2019-04-19 MED ORDER — OMEPRAZOLE 40 MG PO CPDR: 40.0000 mg | DELAYED_RELEASE_CAPSULE | Freq: Every day | ORAL | 0 refills | Status: DC

## 2019-04-19 NOTE — Progress Notes (Signed)
Patient is scheduled   

## 2019-04-19 NOTE — Progress Notes (Signed)
Virtual Visit via Video Note   I connected with Mark Miranda on 04/19/19 by a video enabled telemedicine application and verified that I am speaking with the correct person using two identifiers.  Location patient: home Location provider:work office Persons participating in the virtual visit: patient, provider  I discussed the limitations of evaluation and management by telemedicine and the availability of in person appointments. The patient expressed understanding and agreed to proceed.   HPI: Mark. Bel[place]ere is a 48 yo male with hx of HTN, depression, DM 1 with peripheral neuropathy, chronic opioid intake, and status post kidney/pancreatic transplant who is complaining about "acid reflux." No history of GERD. He states that right after eating he has noted burning epigastric sensation, mild and no radiated. He denies dysphagia. "Acidic" sensation in throat when he overeats.  He has not noted if problem is exacerbated by certain type of food. He has not identified alleviating factors.  Problem has been going on for 1 to 2 weeks, reporting this as a new problem. He denies associated nausea, vomiting, changes in bowel habits, or melena. He has not tried OTC medications.  He is also complaining about dizziness, we had discussed this problem before. Last visit he mentioned associated hearing loss, today he states that this has resolved.  Dizziness is not like spinning sensation and it does not happen while he is in bed.  It is no longer associated with nausea.  Negative for chest pain, dyspnea, cough, wheezing, abdominal pain, vomiting, or focal neurologic deficit. Usually happens when he is going up a laddler or getting up from working on the floor (while at work), when extending neck to look up.Resolves after 1-2 seconds.  Associated occipital pressure headache that also lasts a few seconds.   HTN:He is not checking BP at home periodically. Reporting that recently during pain management  appointment his BP was 138/80. Currently he is on lisinopril 10 mg daily.   ROS: See pertinent positives and negatives per HPI.  Past Medical History:  Diagnosis Date  . Depression   . Diabetes mellitus   . Diabetic peripheral neuropathy ([place]) 11/24/2015  . Hypercholesterolemia   . Hypertension   . Migraines   . Renal disorder     Past Surgical History:  Procedure Laterality Date  . AV FISTULA PLACEMENT  01/10/2012   Procedure: ARTERIOVENOUS (AV) FISTULA CREATION;  Surgeon: Elam Dutch, MD;  Location: [facility] OR;  Service: Vascular;  Laterality: Left;  Left Brachial Cephalic Arteriovenous Fistula  . CAPD INSERTION  01/09/2012   Procedure: CONTINUOUS AMBULATORY PERITONEAL DIALYSIS  (CAPD) CATHETER INSERTION;  Surgeon: Adin Hector, MD;  Location: [place];  Service: General;  Laterality: N/A;  Exteriorization PD Cath  . INSERTION OF DIALYSIS CATHETER  01/13/2012   Procedure: INSERTION OF DIALYSIS CATHETER;  Surgeon: Rosetta Posner, MD;  Location: [facility];  Service: Vascular;  Laterality: Right;  Internal Jugular  . KIDNEY TRANSPLANT    . LAPAROSCOPY  03/13/2012   Procedure: LAPAROSCOPY DIAGNOSTIC;  Surgeon: Adin Hector, MD;  Location: [facility];  Service: General;  Laterality: N/A;  . PERITONEAL CATHETER INSERTION      Family History  Problem Relation Age of Onset  . Diabetes Mother   . Hypertension Mother   . Hyperlipidemia Mother   . Cancer Father        glioblastoma    Social History   Socioeconomic History  . Marital status: Married    Spouse name: Not on file  . Number of children:  4  . Years of education: 39  . Highest education level: Not on file  Occupational History  . Occupation: [employer]  . Financial resource strain: Not on file  . Food insecurity    Worry: Not on file    Inability: Not on file  . Transportation needs    Medical: Not on file    Non-medical: Not on file  Tobacco Use  . Smoking status: Current Every Day Smoker    Packs/day: 1.00     Years: 22.00    Pack years: 22.00    Types: Cigarettes  . Smokeless tobacco: Never Used  Substance and Sexual Activity  . Alcohol use: Yes    Alcohol/week: 0.0 standard drinks    Comment: socially  . Drug use: No  . Sexual activity: Not on file  Lifestyle  . Physical activity    Days per week: Not on file    Minutes per session: Not on file  . Stress: Not on file  Relationships  . Social Musician on phone: Not on file    Gets together: Not on file    Attends religious service: Not on file    Active member of club or organization: Not on file    Attends meetings of clubs or organizations: Not on file    Relationship status: Not on file  . Intimate partner violence    Fear of current or ex partner: Not on file    Emotionally abused: Not on file    Physically abused: Not on file    Forced sexual activity: Not on file  Other Topics Concern  . Not on file  Social History Narrative   Paints houses   Wife works at a Retail banker at [employer]   4 children age 33 to 40   Regular exercise-no current exercise regimen   Caffeine Use-no      Current Outpatient Medications:  .  amitriptyline (ELAVIL) 25 MG tablet, TAKE 1 TABLET BY MOUTH AT BEDTIME (STOP CYMBALTA), Disp: , Rfl:  .  diclofenac sodium (VOLTAREN) 1 % GEL, Apply 4 g topically 4 (four) times daily. (Patient not taking: Reported on 12/03/2018), Disp: 4 Tube, Rfl: 3 .  docusate sodium (COLACE) 100 MG capsule, Take by mouth., Disp: , Rfl:  .  lisinopril (PRINIVIL,ZESTRIL) 10 MG tablet, Take 1 tablet (10 mg total) by mouth daily., Disp: 90 tablet, Rfl: 2 .  mycophenolate (MYFORTIC) 180 MG EC tablet, Take 180 mg by mouth 2 (two) times daily., Disp: , Rfl:  .  nicotine (NICODERM CQ - DOSED IN MG/24 HOURS) 21 mg/24hr patch, Place 1 patch (21 mg total) onto the skin daily., Disp: 28 patch, Rfl: 0 .  omeprazole (PRILOSEC) 40 MG capsule, Take 1 capsule (40 mg total) by mouth daily., Disp: 30 capsule, Rfl: 0 .  Oxycodone HCl 10  MG TABS, TAKE 1 TABLET BY MOUTH EVERY 6 HOURS AS NEEDED FOR PAIN (STOP OXYCODONE 5MG , Disp: , Rfl:  .  sildenafil (VIAGRA) 100 MG tablet, Take 0.5-1 tablets (50-100 mg total) by mouth daily as needed for erectile dysfunction., Disp: 15 tablet, Rfl: 6 .  tacrolimus (PROGRAF) 1 MG capsule, Take 1 mg by mouth. Pt take 5 in the morning and 4 at night, Disp: , Rfl:   EXAM:  VITALS per patient if applicable:He does not have a BP monitor.  GENERAL: alert, oriented, appears well and in no acute distress  HEENT: atraumatic, normocephalic,conjunctiva clear, no obvious abnormalities on inspection of  external nose and ears  NECK: normal movements of the head and neck  LUNGS: on inspection no signs of respiratory distress, breathing rate appears normal, no obvious gross SOB, gasping or wheezing  CV: no obvious cyanosis  MS: moves all visible extremities without noticeable abnormality  PSYCH/NEURO: pleasant and cooperative, no obvious depression, + anxious. Speech and thought processing grossly intact  ASSESSMENT AND PLAN:  Discussed the following assessment and plan:  Dizziness - Plan: Ambulatory referral to Neurology, VAS US CAROTID We discussed possible etiologies. History does not suggest vertigo. We will arrange neurology consultation. Some of his chronic medical problems and medication could also contributed to problem.  Carotid artery stenosis also to be considered, so carotid US will be arranged. Strongly recommend smoking cessation. He was clearly instructed about warning signs.  Epigastric pain - Plan: omeprazole (PRILOSEC) 40 MG capsule Possible causes discussed. Recommend eating small portions and avoiding trigger factors. I do not think we need imaging or blood work at this time. Instructed about warning signs.  Gastroesophageal reflux disease, esophagitis presence not specified - Plan: omeprazole (PRILOSEC) 40 MG capsule GERD precautions recommended. Omeprazole 40 mg daily  for 4 weeks. Follow-up in 4 weeks.  Hypertension, unspecified type Recommend monitoring BP at home. Orthostatic hypotension could also be causing/aggravating dizziness. Continue low-salt diet. No changes in lisinopril 10 mg.   I discussed the assessment and treatment plan with the patient. He was provided an opportunity to ask questions and all were answered. He agreed with the plan and demonstrated an understanding of the instructions.   The patient was advised to call back or seek an in-person evaluation if the symptoms worsen or if the condition fails to improve as anticipated.  Return in about 4 weeks (around 05/17/2019) for abdominal pain.Marland Kitchen.     [place], MD

## 2019-05-17 ENCOUNTER — Ambulatory Visit: Payer: BC Managed Care – PPO | Admitting: Family Medicine

## 2019-05-17 ENCOUNTER — Other Ambulatory Visit: Payer: Self-pay

## 2019-05-17 ENCOUNTER — Ambulatory Visit (HOSPITAL_COMMUNITY)
Admission: RE | Admit: 2019-05-17 | Discharge: 2019-05-17 | Disposition: A | Discharge status: Home / Self Care | Payer: BC Managed Care – PPO | Source: Ambulatory Visit | Attending: Cardiology | Admitting: Cardiology

## 2019-05-17 ENCOUNTER — Encounter: Payer: Self-pay | Admitting: Family Medicine

## 2019-05-17 VITALS — BP 118/66 | HR 81 | Temp 97.8°F | Resp 12 | Ht 67.0 in | Wt 155.0 lb

## 2019-05-17 DIAGNOSIS — G894 Chronic pain syndrome: Secondary | ICD-10-CM | POA: Diagnosis not present

## 2019-05-17 DIAGNOSIS — R1013 Epigastric pain: Secondary | ICD-10-CM

## 2019-05-17 DIAGNOSIS — R42 Dizziness and giddiness: Secondary | ICD-10-CM | POA: Diagnosis not present

## 2019-05-17 DIAGNOSIS — E1140 Type 2 diabetes mellitus with diabetic neuropathy, unspecified: Secondary | ICD-10-CM | POA: Diagnosis not present

## 2019-05-17 DIAGNOSIS — I10 Essential (primary) hypertension: Secondary | ICD-10-CM | POA: Diagnosis not present

## 2019-05-17 DIAGNOSIS — M5416 Radiculopathy, lumbar region: Secondary | ICD-10-CM | POA: Diagnosis not present

## 2019-05-17 DIAGNOSIS — Z23 Encounter for immunization: Secondary | ICD-10-CM

## 2019-05-17 DIAGNOSIS — M47816 Spondylosis without myelopathy or radiculopathy, lumbar region: Secondary | ICD-10-CM | POA: Diagnosis not present

## 2019-05-17 MED ORDER — LISINOPRIL 10 MG PO TABS: 10.0000 mg | ORAL_TABLET | Freq: Every day | ORAL | 2 refills | Status: DC

## 2019-05-17 NOTE — Patient Instructions (Addendum)
A few things to remember from today's visit:   Epigastric pain  Dizziness, nonspecific  Hypertension, unspecified type  Avoid activities that can aggravate dizziness. Monitor blood pressure regularly. Start omeprazole 40 mg 30 minutes before breakfast recommended.  GERD:  Avoid foods that make your symptoms worse, for example coffee, chocolate,pepermeint,alcohol, and greasy food. Raising the head of your bed about 6 inches may help with nocturnal symptoms.  Avoid tobacco use. Avoid lying down for 3 hours after eating.  Instead 3 large meals daily try small and more frequent meals during the day.  You should be evaluated immediately if bloody vomiting, bloody stools, black stools (like tar), difficulty swallowing, food gets stuck on the way down or choking when eating. Abnormal weight loss or severe abdominal pain.  If symptoms are not resolved sometimes endoscopy is necessary.  Please be sure medication list is accurate. If a new problem present, please set up appointment sooner than planned today.

## 2019-05-17 NOTE — Progress Notes (Signed)
HPI:   [name] is a 48 y.o. male, who is here today to follow on recent visit.  He was last seen on 04/19/19 and 02/12/19. He has been complaining about dizziness and intermittent hearing loss sensation.  He tells me that it is not dizziness or hearing loss but rather a pressure and ear sensation he cannot explained. Episode last for a few minutes. Exacerbated by going up on ladder at work. States that he doe snot feel unbalance and does not feel like he is going to fall but he is afraid of passing out.  He has not had any episodes since he stopped climbing the ladder at work. He denies associated headache, visual changes, earache, sore throat, chest pain, diaphoresis, or numbness.   Carotid US done today: Right Carotid: Velocities in the right ICA are consistent with a 1-39% stenosis. Left Carotid: Velocities in the left ICA are consistent with a 1-39% stenosis.  Vertebrals:  Bilateral vertebral arteries demonstrate antegrade flow. Subclavians: Normal flow hemodynamics were seen in bilateral subclavian arteries. Still having burning epigastric lower retrosternal area.   Still having burning epigastric and lower retrosternal area. Pain is not radiated, intermittent, and mild. + Heartburn. Symptoms are exacerbated by big meals. He has not identified alleviating factors.  He thinks symptoms are caused or aggravated by Morphine he takes for chronic pain/peripheral neuropathy.   Last visit omeprazole 40 mg was recommended, he did not pick up prescription. He denies nausea, vomiting, changes in bowel habits, or melena.  He has been checking BP at home. He follows with nephrologist regularly. Currently he is on lisinopril 10 mg daily.  Negative for dyspnea, palpitations, focal neurologic deficit, or edema.  Review of Systems  Constitutional: Positive for fatigue. Negative for activity change and appetite change.  HENT: Negative for facial swelling,  nosebleeds, sinus pain and trouble swallowing.   Eyes: Negative for redness and visual disturbance.  Respiratory: Negative for cough and wheezing.   Gastrointestinal: Negative for abdominal pain, nausea and vomiting.  Endocrine: Negative for polydipsia, polyphagia and polyuria.  Genitourinary: Negative for decreased urine volume, dysuria and hematuria.  Musculoskeletal: Negative for gait problem and myalgias.  Neurological: Negative for syncope, facial asymmetry and weakness.  Psychiatric/Behavioral: Negative for confusion. The patient is nervous/anxious.   Rest see pertinent positives and negatives per HPI.   Current Outpatient Medications on File Prior to Visit  Medication Sig Dispense Refill  . amitriptyline (ELAVIL) 50 MG tablet     . morphine (MS CONTIN) 30 MG 12 hr tablet 1 TABLET BY MOUTH EVERY TWELVE HOURS STOP OXYCODONE    . mycophenolate (MYFORTIC) 180 MG EC tablet Take 180 mg by mouth 2 (two) times daily.    Marland Kitchen omeprazole (PRILOSEC) 40 MG capsule Take 1 capsule (40 mg total) by mouth daily. 30 capsule 0  . sildenafil (VIAGRA) 100 MG tablet Take 0.5-1 tablets (50-100 mg total) by mouth daily as needed for erectile dysfunction. 15 tablet 6  . tacrolimus (PROGRAF) 1 MG capsule Take 1 mg by mouth. Pt take 5 in the morning and 4 at night    . [DISCONTINUED] diltiazem (CARDIZEM) 30 MG tablet Take 30 mg by mouth 4 (four) times daily. Ask patient to verify dosage and frequency. Not indicated on medical history form dated 10/23/10.     . [DISCONTINUED] insulin lispro (HUMALOG) 100 UNIT/ML injection Inject into the skin 3 (three) times daily before meals. Ask patient to verify name/dosage/. History form noted dosage of 34 units  during day and 22 units at night.      No current facility-administered medications on file prior to visit.     Past Medical History:  Diagnosis Date  . Depression   . Diabetes mellitus   . Diabetic peripheral neuropathy ([place]) 11/24/2015  . Hypercholesterolemia    . Hypertension   . Migraines   . Renal disorder    Allergies  Allergen Reactions  . Other     Social History   Socioeconomic History  . Marital status: Married    Spouse name: Not on file  . Number of children: 4  . Years of education: 10  . Highest education level: Not on file  Occupational History  . Occupation: [employer]  . Financial resource strain: Not on file  . Food insecurity    Worry: Not on file    Inability: Not on file  . Transportation needs    Medical: Not on file    Non-medical: Not on file  Tobacco Use  . Smoking status: Current Every Day Smoker    Packs/day: 1.00    Years: 22.00    Pack years: 22.00    Types: Cigarettes  . Smokeless tobacco: Never Used  Substance and Sexual Activity  . Alcohol use: Yes    Alcohol/week: 0.0 standard drinks    Comment: socially  . Drug use: No  . Sexual activity: Not on file  Lifestyle  . Physical activity    Days per week: Not on file    Minutes per session: Not on file  . Stress: Not on file  Relationships  . Social Herbalist on phone: Not on file    Gets together: Not on file    Attends religious service: Not on file    Active member of club or organization: Not on file    Attends meetings of clubs or organizations: Not on file    Relationship status: Not on file  Other Topics Concern  . Not on file  Social History Narrative   Paints houses   Wife works at a Pharmacist, hospital at [employer]   4 children age 33 to 18   Regular exercise-no current exercise regimen   Caffeine Use-no    Vitals:   05/17/19 1129  BP: 118/66  Pulse: 81  Resp: 12  Temp: 97.8 F (36.6 C)  SpO2: 96%   Body mass index is 24.28 kg/m.   Physical Exam  Nursing note and vitals reviewed. Constitutional: He is oriented to person, place, and time. He appears well-developed and well-nourished. No distress.  HENT:  Head: Normocephalic and atraumatic.  Mouth/Throat: Oropharynx is clear and moist and mucous  membranes are normal.  Moderate amount of cerumen L>R, still able to see TM but partially.  Eyes: Pupils are equal, round, and reactive to light. Conjunctivae are normal.  Neck: Neck supple.  Dizziness is not elicited with neck rotation or extension.  Cardiovascular: Normal rate and regular rhythm.  No murmur heard. Pulses:      Dorsalis pedis pulses are 2+ on the right side and 2+ on the left side.  Respiratory: Effort normal and breath sounds normal. No respiratory distress.  GI: Soft. He exhibits no mass. There is no hepatomegaly. There is no abdominal tenderness.  Musculoskeletal:        General: No edema.  Lymphadenopathy:    He has no cervical adenopathy.  Neurological: He is alert and oriented to person, place, and time. He has normal strength. No  cranial nerve deficit. Gait normal.  Skin: Skin is warm. No rash noted. No erythema.  Psychiatric: He has a normal mood and affect.  Well groomed, poor eye contact.    ASSESSMENT AND PLAN:  Mr. Dominga Ferrygustin was seen today for follow-up.  Diagnoses and all orders for this visit:  Epigastric pain Still symptomatic. History suggest GERD. Recommend picking up omeprazole 40 mg and take it 30 minutes before breakfast. GERD precautions. He has already identified trigger factors, recommend eating smaller portions. Instructed about warning signs.  Dizziness, nonspecific Problem resolved since he stopped walking up the ladder. Adequate hydration. Recommend avoid trigger factors, he does not think he needs a letter for employer for job restrictions. Instructed about warning signs.  Hypertension, unspecified type BP adequately controlled. Continue lisinopril 10 mg daily. Recommend monitoring BP regularly. Continue low-salt diet. Follows with nephrologist a few times per year.  -     lisinopril (ZESTRIL) 10 MG tablet; Take 1 tablet (10 mg total) by mouth daily.  Need for influenza vaccination -     Flu Vaccine QUAD 36+ mos IM   Return if symptoms worsen or fail to improve.    Tora Prunty G. Swazil[place]Jordan, MD  [facility]. [place] office.

## 2019-06-04 ENCOUNTER — Telehealth: Payer: Self-pay | Admitting: Neurology

## 2019-06-04 ENCOUNTER — Encounter: Payer: Self-pay | Admitting: Neurology

## 2019-06-04 ENCOUNTER — Institutional Professional Consult (permissible substitution): Payer: BLUE CROSS/BLUE SHIELD | Admitting: Neurology

## 2019-06-04 NOTE — Telephone Encounter (Signed)
This patient did not show for a NP appointment today. 

## 2019-06-13 DIAGNOSIS — G894 Chronic pain syndrome: Secondary | ICD-10-CM | POA: Diagnosis not present

## 2019-06-13 DIAGNOSIS — M47816 Spondylosis without myelopathy or radiculopathy, lumbar region: Secondary | ICD-10-CM | POA: Diagnosis not present

## 2019-06-13 DIAGNOSIS — E1140 Type 2 diabetes mellitus with diabetic neuropathy, unspecified: Secondary | ICD-10-CM | POA: Diagnosis not present

## 2019-06-13 DIAGNOSIS — M5416 Radiculopathy, lumbar region: Secondary | ICD-10-CM | POA: Diagnosis not present

## 2019-07-11 DIAGNOSIS — G894 Chronic pain syndrome: Secondary | ICD-10-CM | POA: Diagnosis not present

## 2019-07-11 DIAGNOSIS — N189 Chronic kidney disease, unspecified: Secondary | ICD-10-CM | POA: Diagnosis not present

## 2019-07-11 DIAGNOSIS — M5416 Radiculopathy, lumbar region: Secondary | ICD-10-CM | POA: Diagnosis not present

## 2019-07-11 DIAGNOSIS — N182 Chronic kidney disease, stage 2 (mild): Secondary | ICD-10-CM | POA: Diagnosis not present

## 2019-07-11 DIAGNOSIS — M47816 Spondylosis without myelopathy or radiculopathy, lumbar region: Secondary | ICD-10-CM | POA: Diagnosis not present

## 2019-07-11 DIAGNOSIS — E1140 Type 2 diabetes mellitus with diabetic neuropathy, unspecified: Secondary | ICD-10-CM | POA: Diagnosis not present

## 2019-07-11 DIAGNOSIS — E785 Hyperlipidemia, unspecified: Secondary | ICD-10-CM | POA: Diagnosis not present

## 2019-07-11 DIAGNOSIS — Z940 Kidney transplant status: Secondary | ICD-10-CM | POA: Diagnosis not present

## 2019-07-11 DIAGNOSIS — E119 Type 2 diabetes mellitus without complications: Secondary | ICD-10-CM | POA: Diagnosis not present

## 2019-07-23 DIAGNOSIS — D631 Anemia in chronic kidney disease: Secondary | ICD-10-CM | POA: Diagnosis not present

## 2019-07-23 DIAGNOSIS — Z940 Kidney transplant status: Secondary | ICD-10-CM | POA: Diagnosis not present

## 2019-07-23 DIAGNOSIS — N182 Chronic kidney disease, stage 2 (mild): Secondary | ICD-10-CM | POA: Diagnosis not present

## 2019-07-23 DIAGNOSIS — I10 Essential (primary) hypertension: Secondary | ICD-10-CM | POA: Diagnosis not present

## 2019-08-12 DIAGNOSIS — G894 Chronic pain syndrome: Secondary | ICD-10-CM | POA: Diagnosis not present

## 2019-08-12 DIAGNOSIS — M47816 Spondylosis without myelopathy or radiculopathy, lumbar region: Secondary | ICD-10-CM | POA: Diagnosis not present

## 2019-08-12 DIAGNOSIS — M5416 Radiculopathy, lumbar region: Secondary | ICD-10-CM | POA: Diagnosis not present

## 2019-08-12 DIAGNOSIS — E1140 Type 2 diabetes mellitus with diabetic neuropathy, unspecified: Secondary | ICD-10-CM | POA: Diagnosis not present

## 2019-08-14 ENCOUNTER — Encounter: Payer: Self-pay | Admitting: Family Medicine

## 2019-09-03 DIAGNOSIS — Z940 Kidney transplant status: Secondary | ICD-10-CM | POA: Diagnosis not present

## 2019-09-03 DIAGNOSIS — I10 Essential (primary) hypertension: Secondary | ICD-10-CM | POA: Diagnosis not present

## 2019-09-03 DIAGNOSIS — N182 Chronic kidney disease, stage 2 (mild): Secondary | ICD-10-CM | POA: Diagnosis not present

## 2019-09-13 DIAGNOSIS — E1140 Type 2 diabetes mellitus with diabetic neuropathy, unspecified: Secondary | ICD-10-CM | POA: Diagnosis not present

## 2019-09-13 DIAGNOSIS — M47816 Spondylosis without myelopathy or radiculopathy, lumbar region: Secondary | ICD-10-CM | POA: Diagnosis not present

## 2019-09-13 DIAGNOSIS — G894 Chronic pain syndrome: Secondary | ICD-10-CM | POA: Diagnosis not present

## 2019-09-13 DIAGNOSIS — M5416 Radiculopathy, lumbar region: Secondary | ICD-10-CM | POA: Diagnosis not present

## 2019-10-11 DIAGNOSIS — M5416 Radiculopathy, lumbar region: Secondary | ICD-10-CM | POA: Diagnosis not present

## 2019-10-11 DIAGNOSIS — E1140 Type 2 diabetes mellitus with diabetic neuropathy, unspecified: Secondary | ICD-10-CM | POA: Diagnosis not present

## 2019-10-11 DIAGNOSIS — G894 Chronic pain syndrome: Secondary | ICD-10-CM | POA: Diagnosis not present

## 2019-10-11 DIAGNOSIS — M47816 Spondylosis without myelopathy or radiculopathy, lumbar region: Secondary | ICD-10-CM | POA: Diagnosis not present

## 2019-11-04 DIAGNOSIS — M5416 Radiculopathy, lumbar region: Secondary | ICD-10-CM | POA: Diagnosis not present

## 2019-11-04 DIAGNOSIS — G894 Chronic pain syndrome: Secondary | ICD-10-CM | POA: Diagnosis not present

## 2019-11-04 DIAGNOSIS — M47816 Spondylosis without myelopathy or radiculopathy, lumbar region: Secondary | ICD-10-CM | POA: Diagnosis not present

## 2019-11-04 DIAGNOSIS — E1140 Type 2 diabetes mellitus with diabetic neuropathy, unspecified: Secondary | ICD-10-CM | POA: Diagnosis not present

## 2020-01-07 DIAGNOSIS — E1140 Type 2 diabetes mellitus with diabetic neuropathy, unspecified: Secondary | ICD-10-CM | POA: Diagnosis not present

## 2020-01-07 DIAGNOSIS — M47816 Spondylosis without myelopathy or radiculopathy, lumbar region: Secondary | ICD-10-CM | POA: Diagnosis not present

## 2020-01-07 DIAGNOSIS — M5416 Radiculopathy, lumbar region: Secondary | ICD-10-CM | POA: Diagnosis not present

## 2020-01-07 DIAGNOSIS — G894 Chronic pain syndrome: Secondary | ICD-10-CM | POA: Diagnosis not present

## 2020-01-28 ENCOUNTER — Telehealth (INDEPENDENT_AMBULATORY_CARE_PROVIDER_SITE_OTHER): Payer: Self-pay | Admitting: Family Medicine

## 2020-01-28 ENCOUNTER — Encounter: Payer: Self-pay | Admitting: Family Medicine

## 2020-01-28 DIAGNOSIS — R21 Rash and other nonspecific skin eruption: Secondary | ICD-10-CM

## 2020-01-28 MED ORDER — TRIAMCINOLONE ACETONIDE 0.1 % EX CREA
1.0000 | TOPICAL_CREAM | Freq: Two times a day (BID) | CUTANEOUS | 0 refills | Status: DC
Start: 2020-01-28 — End: 2022-07-22

## 2020-01-28 NOTE — Patient Instructions (Signed)
-  topical antifungal (lotrimin) twice daily for 3- 4 weeks  -can use the triamcinolone cream if itchy 1-2 times daily  -We placed a referral for you as discussed. It usually takes about 3-5 days to process and schedule this referral. If you have not heard from [place] regarding this appointment in 5 days please contact our office.  -follow up in about 2 weeks with Dr. [name], sooner as needed.

## 2020-01-28 NOTE — Progress Notes (Signed)
Virtual Visit via Video Note  I connected with Mark Miranda  on 01/28/20 at 10:00 AM EDT by a video enabled telemedicine application and verified that I am speaking with the correct person using two identifiers.  Location patient: home Location provider:work or home office Persons participating in the virtual visit: patient, provider  I discussed the limitations of evaluation and management by telemedicine and the availability of in person appointments. The patient expressed understanding and agreed to proceed.   HPI:  Acute visit for a Rash: -started 2-3 month ago -has noticed a V shaped rash on his chest and upper back/neck -seems mildly pruritic at times when in the heat -has tried hydroxyzine for the itch, did help a little -denies rash elsewhere, malaise, fevers -hx of type 1 diabetes, had kidney and pancreas transplant -desires referral to dermatology   ROS: See pertinent positives and negatives per HPI.  Past Medical History:  Diagnosis Date  . Depression   . Diabetes mellitus   . Diabetic peripheral neuropathy ([place]) 11/24/2015  . Hypercholesterolemia   . Hypertension   . Migraines   . Renal disorder     Past Surgical History:  Procedure Laterality Date  . AV FISTULA PLACEMENT  01/10/2012   Procedure: ARTERIOVENOUS (AV) FISTULA CREATION;  Surgeon: Elam Dutch, MD;  Location: [facility] OR;  Service: Vascular;  Laterality: Left;  Left Brachial Cephalic Arteriovenous Fistula  . CAPD INSERTION  01/09/2012   Procedure: CONTINUOUS AMBULATORY PERITONEAL DIALYSIS  (CAPD) CATHETER INSERTION;  Surgeon: Adin Hector, MD;  Location: [place];  Service: General;  Laterality: N/A;  Exteriorization PD Cath  . INSERTION OF DIALYSIS CATHETER  01/13/2012   Procedure: INSERTION OF DIALYSIS CATHETER;  Surgeon: Rosetta Posner, MD;  Location: [place];  Service: Vascular;  Laterality: Right;  Internal Jugular  . KIDNEY TRANSPLANT    . LAPAROSCOPY  03/13/2012   Procedure: LAPAROSCOPY DIAGNOSTIC;  Surgeon:  Adin Hector, MD;  Location: [place];  Service: General;  Laterality: N/A;  . PERITONEAL CATHETER INSERTION      Family History  Problem Relation Age of Onset  . Diabetes Mother   . Hypertension Mother   . Hyperlipidemia Mother   . Cancer Father        glioblastoma    SOCIAL HX: see hpi   Current Outpatient Medications:  .  amitriptyline (ELAVIL) 50 MG tablet, , Disp: , Rfl:  .  lisinopril (ZESTRIL) 10 MG tablet, Take 1 tablet (10 mg total) by mouth daily., Disp: 90 tablet, Rfl: 2 .  mycophenolate (MYFORTIC) 180 MG EC tablet, Take 180 mg by mouth 2 (two) times daily., Disp: , Rfl:  .  NUCYNTA ER 100 MG 12 hr tablet, SMARTSIG:1 Tablet(s) By Mouth Every 12 Hours, Disp: , Rfl:  .  omeprazole (PRILOSEC) 40 MG capsule, Take 1 capsule (40 mg total) by mouth daily., Disp: 30 capsule, Rfl: 0 .  sildenafil (VIAGRA) 100 MG tablet, Take 0.5-1 tablets (50-100 mg total) by mouth daily as needed for erectile dysfunction., Disp: 15 tablet, Rfl: 6 .  tacrolimus (PROGRAF) 1 MG capsule, Take 1 mg by mouth. Pt take 5 in the morning and 4 at night, Disp: , Rfl:  .  triamcinolone cream (KENALOG) 0.1 %, Apply 1 application topically 2 (two) times daily., Disp: 30 g, Rfl: 0  EXAM:  VITALS per patient if applicable:  GENERAL: alert, oriented, appears well and in no acute distress  HEENT: atraumatic, conjunttiva clear, no obvious abnormalities on inspection of external nose and  ears  NECK: normal movements of the head and neck  LUNGS: on inspection no signs of respiratory distress, breathing rate appears normal, no obvious gross SOB, gasping or wheezing  CV: no obvious cyanosis  MS: moves all visible extremities without noticeable abnormality  SKIN: video quality is poor limiting exam somewhat and did make patient aware of this and inperson visit offered if desire. Can appreciate some hyperpigmentation in what appears to be closely spaced small macules only on the upper chest and upper back/post  neck  PSYCH/NEURO: pleasant and cooperative, no obvious depression or anxiety, speech and thought processing grossly intact  ASSESSMENT AND PLAN:  Discussed the following assessment and plan:  Rash - Plan: Ambulatory referral to Dermatology  -we discussed possible serious and likely etiologies, options for evaluation and workup, limitations of telemedicine visit vs in person visit, treatment, treatment risks and precautions. Pt prefers to treat via telemedicine empirically rather then risking or undertaking an in person visit at this moment. Given seems worse with heat and pruritis - query possible fungal etiology vs other. Exam limited due to video quality - pt advised of this. PCP appt offered. He desires referral to dermatology  - placed. in interim trial topical lotrimin twice daily. Topical steroid 1-2 times daily prn if itchy. Advised pcp follow up in 2 weeks - advised to contat PCP office. Patient agrees to seek prompt in person care if worsening, new symptoms arise, or if is not improving with treatment. Advised pt to call PCP office if he has not heard from the office regarding the referral in the next 3-5 days.   I discussed the assessment and treatment plan with the patient. The patient was provided an opportunity to ask questions and all were answered. The patient agreed with the plan and demonstrated an understanding of the instructions.   The patient was advised to call back or seek an in-person evaluation if the symptoms worsen or if the condition fails to improve as anticipated.   Terressa Koyanagi, DO

## 2020-02-14 DIAGNOSIS — E119 Type 2 diabetes mellitus without complications: Secondary | ICD-10-CM | POA: Diagnosis not present

## 2020-02-14 DIAGNOSIS — N182 Chronic kidney disease, stage 2 (mild): Secondary | ICD-10-CM | POA: Diagnosis not present

## 2020-02-14 DIAGNOSIS — Z940 Kidney transplant status: Secondary | ICD-10-CM | POA: Diagnosis not present

## 2020-02-18 DIAGNOSIS — Z940 Kidney transplant status: Secondary | ICD-10-CM | POA: Diagnosis not present

## 2020-02-18 DIAGNOSIS — N182 Chronic kidney disease, stage 2 (mild): Secondary | ICD-10-CM | POA: Diagnosis not present

## 2020-02-18 DIAGNOSIS — D631 Anemia in chronic kidney disease: Secondary | ICD-10-CM | POA: Diagnosis not present

## 2020-02-18 DIAGNOSIS — I10 Essential (primary) hypertension: Secondary | ICD-10-CM | POA: Diagnosis not present

## 2020-03-04 DIAGNOSIS — B360 Pityriasis versicolor: Secondary | ICD-10-CM | POA: Diagnosis not present

## 2020-03-05 DIAGNOSIS — G894 Chronic pain syndrome: Secondary | ICD-10-CM | POA: Diagnosis not present

## 2020-03-05 DIAGNOSIS — M47816 Spondylosis without myelopathy or radiculopathy, lumbar region: Secondary | ICD-10-CM | POA: Diagnosis not present

## 2020-03-05 DIAGNOSIS — E1140 Type 2 diabetes mellitus with diabetic neuropathy, unspecified: Secondary | ICD-10-CM | POA: Diagnosis not present

## 2020-03-05 DIAGNOSIS — M5416 Radiculopathy, lumbar region: Secondary | ICD-10-CM | POA: Diagnosis not present

## 2020-04-16 DIAGNOSIS — M47816 Spondylosis without myelopathy or radiculopathy, lumbar region: Secondary | ICD-10-CM | POA: Diagnosis not present

## 2020-04-16 DIAGNOSIS — E1140 Type 2 diabetes mellitus with diabetic neuropathy, unspecified: Secondary | ICD-10-CM | POA: Diagnosis not present

## 2020-04-16 DIAGNOSIS — Z79891 Long term (current) use of opiate analgesic: Secondary | ICD-10-CM | POA: Diagnosis not present

## 2020-04-16 DIAGNOSIS — G894 Chronic pain syndrome: Secondary | ICD-10-CM | POA: Diagnosis not present

## 2020-04-16 DIAGNOSIS — M5416 Radiculopathy, lumbar region: Secondary | ICD-10-CM | POA: Diagnosis not present

## 2020-05-05 ENCOUNTER — Other Ambulatory Visit: Payer: Self-pay | Admitting: Family Medicine

## 2020-05-05 DIAGNOSIS — I10 Essential (primary) hypertension: Secondary | ICD-10-CM

## 2021-08-06 LAB — CBC AND DIFFERENTIAL
HCT: 50 (ref 41–53)
Hemoglobin: 17.6 — AB (ref 13.5–17.5)
Platelets: 236 (ref 150–399)
WBC: 11

## 2021-08-06 LAB — HEPATIC FUNCTION PANEL
ALT: 12 (ref 10–40)
AST: 14 (ref 14–40)
Alkaline Phosphatase: 117 (ref 25–125)
Bilirubin, Total: 0.5

## 2021-08-06 LAB — LIPID PANEL
Cholesterol: 207 — AB (ref 0–200)
LDL Cholesterol: 143
Triglycerides: 97 (ref 40–160)

## 2021-08-06 LAB — BASIC METABOLIC PANEL
BUN: 19 (ref 4–21)
CO2: 25 — AB (ref 13–22)
Chloride: 103 (ref 99–108)
Creatinine: 1 (ref 0.6–1.3)
Glucose: 127
Potassium: 4.8 (ref 3.4–5.3)
Sodium: 137 (ref 137–147)

## 2021-08-06 LAB — HEMOGLOBIN A1C: Hemoglobin A1C: 6.6

## 2021-08-06 LAB — COMPREHENSIVE METABOLIC PANEL
Albumin: 4.5 (ref 3.5–5.0)
Calcium: 9 (ref 8.7–10.7)
GFR calc non Af Amer: 92
Globulin: 2.2

## 2021-08-16 ENCOUNTER — Other Ambulatory Visit: Payer: Self-pay | Admitting: Family Medicine

## 2021-08-16 DIAGNOSIS — I10 Essential (primary) hypertension: Secondary | ICD-10-CM

## 2021-08-27 ENCOUNTER — Encounter: Payer: Self-pay | Admitting: Family Medicine

## 2021-11-14 ENCOUNTER — Other Ambulatory Visit: Payer: Self-pay | Admitting: Family Medicine

## 2021-11-14 DIAGNOSIS — I10 Essential (primary) hypertension: Secondary | ICD-10-CM

## 2021-12-18 ENCOUNTER — Other Ambulatory Visit: Payer: Self-pay | Admitting: Family Medicine

## 2021-12-18 DIAGNOSIS — I10 Essential (primary) hypertension: Secondary | ICD-10-CM

## 2022-07-15 ENCOUNTER — Telehealth: Payer: Commercial Managed Care - HMO | Admitting: Family Medicine

## 2022-07-20 NOTE — Progress Notes (Signed)
ACUTE VISIT Chief Complaint  Patient presents with   Follow-up    Diabetes medications   HPI: [name] is a 51 y.o. male with past medical history significant for vitamin D deficiency, hypertension, DM 2,chronic fatigue, chronic pain, ESRD S/P kidney/pancreas transplant in 2014 who is here today for follow-up on his DM II management.  His last visit was on 05/17/2019.  He has been on Januvia 50 mg, daily for about 7 months,  prescribed by Dr. Evon Slack nephrologist. He has not been checking his blood sugar regularly. Not sure about last A1C. + Peripheral neuropathy, he is not longer on Amitriptyline 50 mg daily. Last HgA1C 5.9 in 02/2022.  HTN on Lisinopril 10 mg daily. He does not check BP regularly. Negative for severe/frequent headache, visual changes, chest pain, dyspnea, palpitation, claudication, focal weakness, or edema.  He has not had a recent eye examination and is unsure when his last dental visit was.   Negative for depressed mood. He is not on pharmacologic treatment.     07/22/2022   11:44 PM 07/22/2022    3:50 PM 10/07/2018    7:15 PM 07/24/2017    1:20 PM 02/23/2017    5:22 PM  Depression screen PHQ 2/9  Decreased Interest 0 0 3 3 1   Down, Depressed, Hopeless 0 0 2 3 1   PHQ - 2 Score 0 0 5 6 2   Altered sleeping 1  3 2 1   Tired, decreased energy 2  3 3 1   Change in appetite 1  1 1 1   Feeling bad or failure about yourself  0  2 1 1   Trouble concentrating 1  1 0 0  Moving slowly or fidgety/restless 0  0 2 0  Suicidal thoughts 0  1 0 0  PHQ-9 Score 5  16 15 6   Difficult doing work/chores Not difficult at all  Somewhat difficult     Review of Systems  Constitutional:  Positive for fatigue. Negative for activity change, appetite change and fever.  HENT:  Negative for nosebleeds and sore throat.   Respiratory:  Negative for cough and wheezing.   Gastrointestinal:  Negative for abdominal pain, nausea and vomiting.  Genitourinary:  Negative for  decreased urine volume and hematuria.  Skin:  Negative for rash.  Neurological:  Negative for syncope and facial asymmetry.  See other pertinent positives and negatives in HPI.  Current Outpatient Medications on File Prior to Visit  Medication Sig Dispense Refill   amitriptyline (ELAVIL) 50 MG tablet      lisinopril (ZESTRIL) 10 MG tablet TAKE 1 TABLET BY MOUTH EVERY DAY 90 tablet 0   mycophenolate (MYFORTIC) 180 MG EC tablet Take 180 mg by mouth 2 (two) times daily.     sildenafil (VIAGRA) 100 MG tablet Take 0.5-1 tablets (50-100 mg total) by mouth daily as needed for erectile dysfunction. 15 tablet 6   tacrolimus (PROGRAF) 1 MG capsule Take 1 mg by mouth. Pt take 5 in the morning and 4 at night     [DISCONTINUED] diltiazem (CARDIZEM) 30 MG tablet Take 30 mg by mouth 4 (four) times daily. Ask patient to verify dosage and frequency. Not indicated on medical history form dated 10/23/10.      [DISCONTINUED] insulin lispro (HUMALOG) 100 UNIT/ML injection Inject into the skin 3 (three) times daily before meals. Ask patient to verify name/dosage/. History form noted dosage of 34 units during day and 22 units at night.      No current facility-administered medications on file  prior to visit.   Past Medical History:  Diagnosis Date   Depression    Diabetes mellitus    Diabetic peripheral neuropathy ([facility]) 11/24/2015   Hypercholesterolemia    Hypertension    Migraines    Renal disorder    Allergies  Allergen Reactions   Other     Medication-patient cannot recall name   Social History   Socioeconomic History   Marital status: Married    Spouse name: Not on file   Number of children: 4   Years of education: 10   Highest education level: Not on file  Occupational History   Occupation: Painter  Tobacco Use   Smoking status: Every Day    Packs/day: 1.00    Years: 22.00    Total pack years: 22.00    Types: Cigarettes   Smokeless tobacco: Never  Substance and Sexual Activity   Alcohol  use: Yes    Alcohol/week: 0.0 standard drinks of alcohol    Comment: socially   Drug use: No   Sexual activity: Not on file  Other Topics Concern   Not on file  Social History Narrative   Paints houses   Wife works at a Retail banker at [employer]   4 children age 65 to 56   Regular exercise-no current exercise regimen   Caffeine Use-no   Social Determinants of Health   Financial Resource Strain: Not on file  Food Insecurity: Not on file  Transportation Needs: Not on file  Physical Activity: Not on file  Stress: Not on file  Social Connections: Not on file   Vitals:   07/22/22 1549  BP: 122/80  Pulse: 74  Resp: 12  SpO2: 97%  Body mass index is 24.81 kg/m.  Physical Exam Vitals and nursing note reviewed.  Constitutional:      General: He is not in acute distress.    Appearance: He is well-developed.  HENT:     Head: Normocephalic and atraumatic.     Mouth/Throat:     Mouth: Mucous membranes are moist.     Pharynx: Oropharynx is clear.  Eyes:     Conjunctiva/sclera: Conjunctivae normal.  Cardiovascular:     Rate and Rhythm: Normal rate and regular rhythm.     Pulses:          Dorsalis pedis pulses are 2+ on the right side and 2+ on the left side.     Heart sounds: No murmur heard. Pulmonary:     Effort: Pulmonary effort is normal. No respiratory distress.     Breath sounds: Normal breath sounds.  Abdominal:     Palpations: Abdomen is soft. There is no hepatomegaly or mass.     Tenderness: There is no abdominal tenderness.  Skin:    General: Skin is warm.     Findings: No erythema or rash.  Neurological:     Mental Status: He is alert and oriented to person, place, and time.     Cranial Nerves: No cranial nerve deficit.     Gait: Gait normal.  Psychiatric:        Mood and Affect: Affect is flat.   ASSESSMENT AND PLAN: [name] is a 50 yo male seen today for follow up. Lab Results  Component Value Date   HGBA1C 6.6 (A) 07/22/2022   Type 2 diabetes  mellitus with chronic kidney disease, without long-term current use of insulin, unspecified CKD stage (HCC) Assessment & Plan: HgA1C at goal, HgA1C 6.6. Januvia is not longer covered by his insurance,  changed to Tradjenta 5 mg daily. Declined influenza vaccine. Annual eye exam, periodic dental and foot care recommended. F/U in 3-4 months.  Orders: -     POCT glycosylated hemoglobin (Hb A1C) -     linaGLIPtin; Take 1 tablet (5 mg total) by mouth daily.  Dispense: 90 tablet; Refill: 1  Hypertension, unspecified type Assessment & Plan: BP adequately controlled. Continue Lisinopril 10 mg daily and low salt diet. Recommend monitoring BP regularly. He is due for eye exam.   History of simultaneous kidney and pancreas transplant [facility]) Assessment & Plan: Following with nephrologist and transplant team.   Recurrent major depressive disorder, in partial remission (HCC) Assessment & Plan: Improved. He is not longer on pharmacologic treatment.   Return in about 4 months (around 11/21/2022).  Kenwood Rosiak G. [name], MD  [facility]. [place] office.

## 2022-07-22 ENCOUNTER — Encounter: Payer: Self-pay | Admitting: Family Medicine

## 2022-07-22 ENCOUNTER — Ambulatory Visit (INDEPENDENT_AMBULATORY_CARE_PROVIDER_SITE_OTHER): Payer: Commercial Managed Care - HMO | Admitting: Family Medicine

## 2022-07-22 VITALS — BP 122/80 | HR 74 | Resp 12 | Ht 67.0 in | Wt 158.4 lb

## 2022-07-22 DIAGNOSIS — Z9483 Pancreas transplant status: Secondary | ICD-10-CM

## 2022-07-22 DIAGNOSIS — E1022 Type 1 diabetes mellitus with diabetic chronic kidney disease: Secondary | ICD-10-CM | POA: Insufficient documentation

## 2022-07-22 DIAGNOSIS — E1122 Type 2 diabetes mellitus with diabetic chronic kidney disease: Secondary | ICD-10-CM

## 2022-07-22 DIAGNOSIS — F3341 Major depressive disorder, recurrent, in partial remission: Secondary | ICD-10-CM

## 2022-07-22 DIAGNOSIS — I10 Essential (primary) hypertension: Secondary | ICD-10-CM

## 2022-07-22 DIAGNOSIS — Z940 Kidney transplant status: Secondary | ICD-10-CM

## 2022-07-22 DIAGNOSIS — F324 Major depressive disorder, single episode, in partial remission: Secondary | ICD-10-CM | POA: Insufficient documentation

## 2022-07-22 LAB — POCT GLYCOSYLATED HEMOGLOBIN (HGB A1C): Hemoglobin A1C: 6.6 % — AB (ref 4.0–5.6)

## 2022-07-22 MED ORDER — LINAGLIPTIN 5 MG PO TABS: 5.0000 mg | ORAL_TABLET | Freq: Every day | ORAL | 1 refills | Status: DC

## 2022-07-22 NOTE — Assessment & Plan Note (Signed)
Following with nephrologist and transplant team.

## 2022-07-22 NOTE — Patient Instructions (Addendum)
A few things to remember from today's visit:  Type 2 diabetes mellitus with chronic kidney disease, without long-term current use of insulin, unspecified CKD stage (HCC) - Plan: POC HgB A1c, linagliptin (TRADJENTA) 5 MG TABS tablet  Tradjenda 5 mg daily started today. If not cover by your insurance, please contact your insurance to find out which meds are covered under your plan.  If you need refills for medications you take chronically, please call your pharmacy. Do not use My Chart to request refills or for acute issues that need immediate attention. If you send a my chart message, it may take a few days to be addressed, specially if I am not in the office.  Please be sure medication list is accurate. If a new problem present, please set up appointment sooner than planned today.

## 2022-07-22 NOTE — Assessment & Plan Note (Signed)
BP adequately controlled. Continue Lisinopril 10 mg daily and low salt diet. Recommend monitoring BP regularly. He is due for eye exam.

## 2022-07-22 NOTE — Assessment & Plan Note (Addendum)
HgA1C at goal, HgA1C 6.6. Januvia is not longer covered by his insurance, changed to Tradjenta 5 mg daily. Declined influenza vaccine. Annual eye exam, periodic dental and foot care recommended. F/U in 3-4 months.

## 2022-07-22 NOTE — Assessment & Plan Note (Signed)
Improved. He is not longer on pharmacologic treatment.

## 2022-07-25 ENCOUNTER — Other Ambulatory Visit: Payer: Self-pay | Admitting: Family Medicine

## 2022-07-25 ENCOUNTER — Telehealth: Payer: Self-pay

## 2022-07-25 DIAGNOSIS — E1122 Type 2 diabetes mellitus with diabetic chronic kidney disease: Secondary | ICD-10-CM

## 2022-07-25 MED ORDER — DAPAGLIFLOZIN PROPANEDIOL 10 MG PO TABS: 10.0000 mg | ORAL_TABLET | Freq: Every day | ORAL | 1 refills | Status: DC

## 2022-07-25 NOTE — Telephone Encounter (Signed)
Mark Miranda is not covered by insurance; Farxiga, Glimepiride, Glipizide, Janumet, Jardiance, Metformin, and Pioglitazone are covered alternatives.

## 2022-07-25 NOTE — Telephone Encounter (Signed)
Prescription for Farxiga 10 mg sent to his pharmacy to start taking once daily. Thanks, BJ

## 2022-10-05 ENCOUNTER — Telehealth: Payer: Self-pay | Admitting: Family Medicine

## 2022-10-05 DIAGNOSIS — E1122 Type 2 diabetes mellitus with diabetic chronic kidney disease: Secondary | ICD-10-CM

## 2022-10-05 MED ORDER — DAPAGLIFLOZIN PROPANEDIOL 10 MG PO TABS: 10.0000 mg | ORAL_TABLET | Freq: Every day | ORAL | 1 refills | Status: DC

## 2022-10-05 NOTE — Telephone Encounter (Signed)
Pt is calling and he never pick up dapagliflozin propanediol (FARXIGA) 10 MG TABS tablet in dec please send to  [id number] [place] Phone: [phone] Fax: [phone]

## 2022-10-05 NOTE — Telephone Encounter (Signed)
Rx sent in as requested. 

## 2023-03-04 ENCOUNTER — Emergency Department (HOSPITAL_COMMUNITY): Payer: Worker's Compensation

## 2023-03-04 ENCOUNTER — Emergency Department (HOSPITAL_COMMUNITY): Payer: Medicaid Other

## 2023-03-04 ENCOUNTER — Other Ambulatory Visit: Payer: Self-pay

## 2023-03-04 ENCOUNTER — Emergency Department (HOSPITAL_COMMUNITY)
Admission: EM | Admit: 2023-03-04 | Discharge: 2023-03-04 | Disposition: A | Discharge status: Home / Self Care | Payer: Worker's Compensation | Attending: Emergency Medicine | Admitting: Emergency Medicine

## 2023-03-04 ENCOUNTER — Other Ambulatory Visit: Payer: Self-pay | Admitting: Orthopaedic Surgery

## 2023-03-04 DIAGNOSIS — Z940 Kidney transplant status: Secondary | ICD-10-CM | POA: Insufficient documentation

## 2023-03-04 DIAGNOSIS — S42401A Unspecified fracture of lower end of right humerus, initial encounter for closed fracture: Secondary | ICD-10-CM | POA: Diagnosis not present

## 2023-03-04 DIAGNOSIS — Z9483 Pancreas transplant status: Secondary | ICD-10-CM | POA: Diagnosis not present

## 2023-03-04 DIAGNOSIS — E119 Type 2 diabetes mellitus without complications: Secondary | ICD-10-CM | POA: Diagnosis not present

## 2023-03-04 DIAGNOSIS — Z794 Long term (current) use of insulin: Secondary | ICD-10-CM | POA: Insufficient documentation

## 2023-03-04 DIAGNOSIS — Y990 Civilian activity done for income or pay: Secondary | ICD-10-CM | POA: Insufficient documentation

## 2023-03-04 DIAGNOSIS — Z23 Encounter for immunization: Secondary | ICD-10-CM | POA: Insufficient documentation

## 2023-03-04 DIAGNOSIS — W11XXXA Fall on and from ladder, initial encounter: Secondary | ICD-10-CM | POA: Insufficient documentation

## 2023-03-04 DIAGNOSIS — S42491A Other displaced fracture of lower end of right humerus, initial encounter for closed fracture: Secondary | ICD-10-CM

## 2023-03-04 DIAGNOSIS — S59901A Unspecified injury of right elbow, initial encounter: Secondary | ICD-10-CM | POA: Diagnosis present

## 2023-03-04 HISTORY — DX: Unspecified fracture of lower end of right humerus, initial encounter for closed fracture: S42.401A

## 2023-03-04 MED ORDER — FENTANYL CITRATE PF 50 MCG/ML IJ SOSY
50.0000 ug | PREFILLED_SYRINGE | INTRAMUSCULAR | Status: DC | PRN
  Filled 2023-03-04: qty 1

## 2023-03-04 MED ORDER — CEFAZOLIN SODIUM-DEXTROSE 2-4 GM/100ML-% IV SOLN
2.0000 g | Freq: Once | INTRAVENOUS | Status: AC
  Administered 2023-03-04: 2 g via INTRAVENOUS
  Filled 2023-03-04: qty 100

## 2023-03-04 MED ORDER — FENTANYL CITRATE PF 50 MCG/ML IJ SOSY
50.0000 ug | PREFILLED_SYRINGE | Freq: Once | INTRAMUSCULAR | Status: AC
  Administered 2023-03-04: 50 ug via INTRAVENOUS
  Filled 2023-03-04: qty 1

## 2023-03-04 MED ORDER — OXYCODONE HCL 5 MG PO TABS: 5.0000 mg | ORAL_TABLET | ORAL | 0 refills | Status: DC | PRN

## 2023-03-04 MED ORDER — HYDROCODONE-ACETAMINOPHEN 5-325 MG PO TABS
1.0000 | ORAL_TABLET | Freq: Three times a day (TID) | ORAL | 0 refills | Status: DC | PRN
Start: 2023-03-04 — End: 2023-03-06

## 2023-03-04 MED ORDER — CEPHALEXIN 500 MG PO CAPS: 500.0000 mg | ORAL_CAPSULE | Freq: Four times a day (QID) | ORAL | 0 refills | Status: DC

## 2023-03-04 MED ORDER — TETANUS-DIPHTH-ACELL PERTUSSIS 5-2.5-18.5 LF-MCG/0.5 IM SUSY
0.5000 mL | PREFILLED_SYRINGE | Freq: Once | INTRAMUSCULAR | Status: AC
  Administered 2023-03-04: 0.5 mL via INTRAMUSCULAR
  Filled 2023-03-04: qty 0.5

## 2023-03-04 NOTE — ED Notes (Signed)
Patient transported to CT 

## 2023-03-04 NOTE — Discharge Instructions (Addendum)
You have a fracture to your elbow.  It would likely need surgical care.  Expect a call from orthopedic team.  Take the medications that are prescribed for pain management.

## 2023-03-04 NOTE — Consult Note (Signed)
ORTHOPAEDIC CONSULTATION  REQUESTING PHYSICIAN: Derwood Kaplan, MD  Chief Complaint: Right elbow fracture  HPI: Mark Miranda is a 52 y.o. male who presents with presents with a right elbow fracture after a fall while landing off a roof.  He does have a history of renal transplant.  This is from a history of diabetes.  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  This is a complaint of a Worker's Comp. injury.  Denies any numbness or paresthesias in the  Past Medical History:  Diagnosis Date   Depression    Diabetes mellitus    Diabetic peripheral neuropathy (HCC) 11/24/2015   Hypercholesterolemia    Hypertension    Migraines    Renal disorder    Past Surgical History:  Procedure Laterality Date   AV FISTULA PLACEMENT  01/10/2012   Procedure: ARTERIOVENOUS (AV) FISTULA CREATION;  Surgeon: Sherren Kerns, MD;  Location: [facility] OR;  Service: Vascular;  Laterality: Left;  Left Brachial Cephalic Arteriovenous Fistula   CAPD INSERTION  01/09/2012   Procedure: CONTINUOUS AMBULATORY PERITONEAL DIALYSIS  (CAPD) CATHETER INSERTION;  Surgeon: Ernestene Mention, MD;  Location: [facility] OR;  Service: General;  Laterality: N/A;  Exteriorization PD Cath   INSERTION OF DIALYSIS CATHETER  01/13/2012   Procedure: INSERTION OF DIALYSIS CATHETER;  Surgeon: Larina Earthly, MD;  Location: [facility] OR;  Service: Vascular;  Laterality: Right;  Internal Jugular   KIDNEY TRANSPLANT     LAPAROSCOPY  03/13/2012   Procedure: LAPAROSCOPY DIAGNOSTIC;  Surgeon: Ardeth Sportsman, MD;  Location: [facility] OR;  Service: General;  Laterality: N/A;   PERITONEAL CATHETER INSERTION     Social History   Socioeconomic History   Marital status: Married    Spouse name: Not on file   Number of children: 4   Years of education: 10   Highest education level: Not on file  Occupational History   Occupation: Painter  Tobacco Use   Smoking status: Every Day    Current packs/day: 1.00    Average packs/day: 1 pack/day for 22.0 years (22.0 ttl  pk-yrs)    Types: Cigarettes   Smokeless tobacco: Never  Substance and Sexual Activity   Alcohol use: Yes    Alcohol/week: 0.0 standard drinks of alcohol    Comment: socially   Drug use: No   Sexual activity: Not on file  Other Topics Concern   Not on file  Social History Narrative   Paints houses   Wife works at a Retail banker at [employer]   4 children age 26 to 53   Regular exercise-no current exercise regimen   Caffeine Use-no   Social Determinants of Health   Financial Resource Strain: Not on file  Food Insecurity: Not on file  Transportation Needs: Not on file  Physical Activity: Not on file  Stress: Not on file  Social Connections: Not on file   Family History  Problem Relation Age of Onset   Diabetes Mother    Hypertension Mother    Hyperlipidemia Mother    Cancer Father        glioblastoma   - negative except otherwise stated in the family history section Allergies  Allergen Reactions   Other     Medication-patient cannot recall name   Prior to Admission medications   Medication Sig Start Date End Date Taking? Authorizing Provider  amitriptyline (ELAVIL) 50 MG tablet  05/16/19   [provider]  dapagliflozin propanediol (FARXIGA) 10 MG TABS tablet Take 1 tablet (10  mg total) by mouth daily before breakfast. 10/05/22   [name], [name], MD  lisinopril (ZESTRIL) 10 MG tablet TAKE 1 TABLET BY MOUTH EVERY DAY 08/17/21   [name], [name], MD  mycophenolate (MYFORTIC) 180 MG EC tablet Take 180 mg by mouth 2 (two) times daily.    [provider]  sildenafil (VIAGRA) 100 MG tablet Take 0.5-1 tablets (50-100 mg total) by mouth daily as needed for erectile dysfunction. 10/05/18   [name], [name], MD  tacrolimus (PROGRAF) 1 MG capsule Take 1 mg by mouth. Pt take 5 in the morning and 4 at night    [provider]  diltiazem (CARDIZEM) 30 MG tablet Take 30 mg by mouth 4 (four) times daily. Ask patient to verify dosage and frequency. Not indicated on medical  history form dated 10/23/10.   10/30/11  [provider]  insulin lispro (HUMALOG) 100 UNIT/ML injection Inject into the skin 3 (three) times daily before meals. Ask patient to verify name/dosage/. History form noted dosage of 34 units during day and 22 units at night.   10/30/11  [provider]   DG Forearm Right  Result Date: 03/04/2023 CLINICAL DATA:  Fall from ladder. EXAM: RIGHT FOREARM - 2 VIEW COMPARISON:  None Available. FINDINGS: Severely displaced and comminuted distal right humeral fracture is noted. The radius and ulna are unremarkable. Vascular calcifications are noted. IMPRESSION: Severely displaced and comminuted distal right humeral fracture. Electronically Signed   By: Lupita Raider M.D.   On: 03/04/2023 13:36   DG Elbow Complete Right  Result Date: 03/04/2023 CLINICAL DATA:  Fall from ladder. EXAM: RIGHT ELBOW - COMPLETE 3+ VIEW COMPARISON:  None Available. FINDINGS: Severely displaced and comminuted fracture is seen involving the distal right humerus. Visualized portions of proximal radius and ulna are unremarkable. IMPRESSION: Severely displaced and comminuted distal right humeral fracture. Electronically Signed   By: Lupita Raider M.D.   On: 03/04/2023 13:35   DG Chest Port 1 View  Result Date: 03/04/2023 CLINICAL DATA:  Fall from ladder. EXAM: PORTABLE CHEST 1 VIEW COMPARISON:  March 08, 2012. FINDINGS: The heart size and mediastinal contours are within normal limits. Both lungs are clear. The visualized skeletal structures are unremarkable. IMPRESSION: No active disease. Electronically Signed   By: Lupita Raider M.D.   On: 03/04/2023 13:33   CT Head Wo Contrast  Result Date: 03/04/2023 CLINICAL DATA:  52 year old male status post fall from ladder. EXAM: CT HEAD WITHOUT CONTRAST TECHNIQUE: Contiguous axial images were obtained from the base of the skull through the vertex without intravenous contrast. RADIATION DOSE REDUCTION: This exam was performed  according to the departmental dose-optimization program which includes automated exposure control, adjustment of the mA and/or kV according to patient size and/or use of iterative reconstruction technique. COMPARISON:  Head CT 10/30/2011. FINDINGS: Brain: No midline shift, ventriculomegaly, mass effect, evidence of mass lesion, intracranial hemorrhage or evidence of cortically based acute infarction. Gray-white matter differentiation is within normal limits throughout the brain. Vascular: Advanced for age calcified atherosclerosis at the skull base. No suspicious intracranial vascular hyperdensity. Skull: No fracture identified. Sinuses/Orbits: Bilateral paranasal sinus mucosal thickening with bubbly opacity appears to be inflammatory in nature, new since 2013. Tympanic cavities and mastoids remain clear. Other: No discrete orbit or scalp soft tissue injury. No soft tissue gas. IMPRESSION: 1.  No acute traumatic injury identified. 2. Age advanced calcified atherosclerosis at the skull base. But otherwise normal noncontrast CT appearance of the brain. 3. Bilateral paranasal  sinus inflammation. Electronically Signed   By: Odessa Fleming M.D.   On: 03/04/2023 12:56     Positive ROS: All other systems have been reviewed and were otherwise negative with the exception of those mentioned in the HPI and as above.  Physical Exam: General: No acute distress Cardiovascular: No pedal edema Respiratory: No cyanosis, no use of accessory musculature GI: No organomegaly, abdomen is soft and non-tender Skin: No lesions in the area of chief complaint Neurologic: Sensation intact distally Psychiatric: Patient is at baseline mood and affect Lymphatic: No axillary or cervical lymphadenopathy  MUSCULOSKELETAL:  Right elbow with obvious deformity about the right distal humerus.  There is a small laceration about the proximal posterior triceps that does not communicate with the fracture.  This appears to be a tension type wound.   Fires EPL as well as flexors of the right hand.  2+ radial pulse.  Independent Imaging Review: 3 views right elbow: Displaced supracondylar distal humeral fracture  Assessment: 52 year old right-hand-dominant male with a right displaced distal humeral fracture.  I do not believe that this is an open fracture.  I would like him to be placed in a dressing and a posterior slab splint.  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  This did occur as part of a Worker's Comp. injury.  Will plan for outpatient fixation.  I recommended the CT scan for further characterization of the fracture  Plan: Plan for CT scan right elbow follow-up for definitive fixation   -Plan for right distal humerus open reduction internal fixation  After a lengthy discussion of treatment options, including risks, benefits, alternatives, complications of surgical and nonsurgical conservative options, the patient elected surgical repair.   The patient  is aware of the material risks  and complications including, but not limited to injury to adjacent structures, neurovascular injury, infection, numbness, bleeding, implant failure, thermal burns, stiffness, persistent pain, failure to heal, disease transmission from allograft, need for further surgery, dislocation, anesthetic risks, blood clots, risks of death,and others. The probabilities of surgical success and failure discussed with patient given their particular co-morbidities.The time and nature of expected rehabilitation and recovery was discussed.The patient's questions were all answered preoperatively.  No barriers to understanding were noted. I explained the natural history of the disease process and Rx rationale.  I explained to the patient what I considered to be reasonable expectations given their personal situation.  The final treatment plan was arrived at through a shared  patient decision making process model.   Thank you for the consult and the opportunity to see Mr. Aarjav Schechter, MD [facility] 2:22 PM

## 2023-03-04 NOTE — ED Notes (Signed)
Md at bedside

## 2023-03-04 NOTE — Progress Notes (Signed)
Orthopedic Tech Progress Note Patient Details:  Mark Miranda [birth year] [id number] Unable to position patient's arm in a 90 degree angle due to pain level.  Ortho Devices Type of Ortho Device: Post (long arm) splint Ortho Device/Splint Location: Right arm Ortho Device/Splint Interventions: Application   Post Interventions Patient Tolerated: Well  [name] 03/04/2023, 2:49 PM

## 2023-03-04 NOTE — ED Triage Notes (Signed)
Pt was on a ladder at work tightening a bolt slipped off the ladder 4 ft fell on cement no loc deformity to the right arm

## 2023-03-04 NOTE — H&P (View-Only) (Signed)
ORTHOPAEDIC CONSULTATION  REQUESTING PHYSICIAN: Derwood Kaplan, MD  Chief Complaint: Right elbow fracture  HPI: Mark Miranda is a 52 y.o. male who presents with presents with a right elbow fracture after a fall while landing off a roof.  He does have a history of renal transplant.  This is from a history of diabetes.  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  This is a complaint of a Worker's Comp. injury.  Denies any numbness or paresthesias in the  Past Medical History:  Diagnosis Date   Depression    Diabetes mellitus    Diabetic peripheral neuropathy (HCC) 11/24/2015   Hypercholesterolemia    Hypertension    Migraines    Renal disorder    Past Surgical History:  Procedure Laterality Date   AV FISTULA PLACEMENT  01/10/2012   Procedure: ARTERIOVENOUS (AV) FISTULA CREATION;  Surgeon: Sherren Kerns, MD;  Location: [facility] OR;  Service: Vascular;  Laterality: Left;  Left Brachial Cephalic Arteriovenous Fistula   CAPD INSERTION  01/09/2012   Procedure: CONTINUOUS AMBULATORY PERITONEAL DIALYSIS  (CAPD) CATHETER INSERTION;  Surgeon: Ernestene Mention, MD;  Location: [facility] OR;  Service: General;  Laterality: N/A;  Exteriorization PD Cath   INSERTION OF DIALYSIS CATHETER  01/13/2012   Procedure: INSERTION OF DIALYSIS CATHETER;  Surgeon: Larina Earthly, MD;  Location: [facility] OR;  Service: Vascular;  Laterality: Right;  Internal Jugular   KIDNEY TRANSPLANT     LAPAROSCOPY  03/13/2012   Procedure: LAPAROSCOPY DIAGNOSTIC;  Surgeon: Ardeth Sportsman, MD;  Location: [facility] OR;  Service: General;  Laterality: N/A;   PERITONEAL CATHETER INSERTION     Social History   Socioeconomic History   Marital status: Married    Spouse name: Not on file   Number of children: 4   Years of education: 10   Highest education level: Not on file  Occupational History   Occupation: Painter  Tobacco Use   Smoking status: Every Day    Current packs/day: 1.00    Average packs/day: 1 pack/day for 22.0 years (22.0 ttl  pk-yrs)    Types: Cigarettes   Smokeless tobacco: Never  Substance and Sexual Activity   Alcohol use: Yes    Alcohol/week: 0.0 standard drinks of alcohol    Comment: socially   Drug use: No   Sexual activity: Not on file  Other Topics Concern   Not on file  Social History Narrative   Paints houses   Wife works at a Retail banker at [employer]   4 children age 26 to 53   Regular exercise-no current exercise regimen   Caffeine Use-no   Social Determinants of Health   Financial Resource Strain: Not on file  Food Insecurity: Not on file  Transportation Needs: Not on file  Physical Activity: Not on file  Stress: Not on file  Social Connections: Not on file   Family History  Problem Relation Age of Onset   Diabetes Mother    Hypertension Mother    Hyperlipidemia Mother    Cancer Father        glioblastoma   - negative except otherwise stated in the family history section Allergies  Allergen Reactions   Other     Medication-patient cannot recall name   Prior to Admission medications   Medication Sig Start Date End Date Taking? Authorizing Provider  amitriptyline (ELAVIL) 50 MG tablet  05/16/19   [provider]  dapagliflozin propanediol (FARXIGA) 10 MG TABS tablet Take 1 tablet (10  mg total) by mouth daily before breakfast. 10/05/22   [name], [name], MD  lisinopril (ZESTRIL) 10 MG tablet TAKE 1 TABLET BY MOUTH EVERY DAY 08/17/21   [name], [name], MD  mycophenolate (MYFORTIC) 180 MG EC tablet Take 180 mg by mouth 2 (two) times daily.    [provider]  sildenafil (VIAGRA) 100 MG tablet Take 0.5-1 tablets (50-100 mg total) by mouth daily as needed for erectile dysfunction. 10/05/18   [name], [name], MD  tacrolimus (PROGRAF) 1 MG capsule Take 1 mg by mouth. Pt take 5 in the morning and 4 at night    [provider]  diltiazem (CARDIZEM) 30 MG tablet Take 30 mg by mouth 4 (four) times daily. Ask patient to verify dosage and frequency. Not indicated on medical  history form dated 10/23/10.   10/30/11  [provider]  insulin lispro (HUMALOG) 100 UNIT/ML injection Inject into the skin 3 (three) times daily before meals. Ask patient to verify name/dosage/. History form noted dosage of 34 units during day and 22 units at night.   10/30/11  [provider]   DG Forearm Right  Result Date: 03/04/2023 CLINICAL DATA:  Fall from ladder. EXAM: RIGHT FOREARM - 2 VIEW COMPARISON:  None Available. FINDINGS: Severely displaced and comminuted distal right humeral fracture is noted. The radius and ulna are unremarkable. Vascular calcifications are noted. IMPRESSION: Severely displaced and comminuted distal right humeral fracture. Electronically Signed   By: Lupita Raider M.D.   On: 03/04/2023 13:36   DG Elbow Complete Right  Result Date: 03/04/2023 CLINICAL DATA:  Fall from ladder. EXAM: RIGHT ELBOW - COMPLETE 3+ VIEW COMPARISON:  None Available. FINDINGS: Severely displaced and comminuted fracture is seen involving the distal right humerus. Visualized portions of proximal radius and ulna are unremarkable. IMPRESSION: Severely displaced and comminuted distal right humeral fracture. Electronically Signed   By: Lupita Raider M.D.   On: 03/04/2023 13:35   DG Chest Port 1 View  Result Date: 03/04/2023 CLINICAL DATA:  Fall from ladder. EXAM: PORTABLE CHEST 1 VIEW COMPARISON:  March 08, 2012. FINDINGS: The heart size and mediastinal contours are within normal limits. Both lungs are clear. The visualized skeletal structures are unremarkable. IMPRESSION: No active disease. Electronically Signed   By: Lupita Raider M.D.   On: 03/04/2023 13:33   CT Head Wo Contrast  Result Date: 03/04/2023 CLINICAL DATA:  52 year old male status post fall from ladder. EXAM: CT HEAD WITHOUT CONTRAST TECHNIQUE: Contiguous axial images were obtained from the base of the skull through the vertex without intravenous contrast. RADIATION DOSE REDUCTION: This exam was performed  according to the departmental dose-optimization program which includes automated exposure control, adjustment of the mA and/or kV according to patient size and/or use of iterative reconstruction technique. COMPARISON:  Head CT 10/30/2011. FINDINGS: Brain: No midline shift, ventriculomegaly, mass effect, evidence of mass lesion, intracranial hemorrhage or evidence of cortically based acute infarction. Gray-white matter differentiation is within normal limits throughout the brain. Vascular: Advanced for age calcified atherosclerosis at the skull base. No suspicious intracranial vascular hyperdensity. Skull: No fracture identified. Sinuses/Orbits: Bilateral paranasal sinus mucosal thickening with bubbly opacity appears to be inflammatory in nature, new since 2013. Tympanic cavities and mastoids remain clear. Other: No discrete orbit or scalp soft tissue injury. No soft tissue gas. IMPRESSION: 1.  No acute traumatic injury identified. 2. Age advanced calcified atherosclerosis at the skull base. But otherwise normal noncontrast CT appearance of the brain. 3. Bilateral paranasal  sinus inflammation. Electronically Signed   By: Odessa Fleming M.D.   On: 03/04/2023 12:56     Positive ROS: All other systems have been reviewed and were otherwise negative with the exception of those mentioned in the HPI and as above.  Physical Exam: General: No acute distress Cardiovascular: No pedal edema Respiratory: No cyanosis, no use of accessory musculature GI: No organomegaly, abdomen is soft and non-tender Skin: No lesions in the area of chief complaint Neurologic: Sensation intact distally Psychiatric: Patient is at baseline mood and affect Lymphatic: No axillary or cervical lymphadenopathy  MUSCULOSKELETAL:  Right elbow with obvious deformity about the right distal humerus.  There is a small laceration about the proximal posterior triceps that does not communicate with the fracture.  This appears to be a tension type wound.   Fires EPL as well as flexors of the right hand.  2+ radial pulse.  Independent Imaging Review: 3 views right elbow: Displaced supracondylar distal humeral fracture  Assessment: 52 year old right-hand-dominant male with a right displaced distal humeral fracture.  I do not believe that this is an open fracture.  I would like him to be placed in a dressing and a posterior slab splint.  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  This did occur as part of a Worker's Comp. injury.  Will plan for outpatient fixation.  I recommended the CT scan for further characterization of the fracture  Plan: Plan for CT scan right elbow follow-up for definitive fixation   -Plan for right distal humerus open reduction internal fixation  After a lengthy discussion of treatment options, including risks, benefits, alternatives, complications of surgical and nonsurgical conservative options, the patient elected surgical repair.   The patient  is aware of the material risks  and complications including, but not limited to injury to adjacent structures, neurovascular injury, infection, numbness, bleeding, implant failure, thermal burns, stiffness, persistent pain, failure to heal, disease transmission from allograft, need for further surgery, dislocation, anesthetic risks, blood clots, risks of death,and others. The probabilities of surgical success and failure discussed with patient given their particular co-morbidities.The time and nature of expected rehabilitation and recovery was discussed.The patient's questions were all answered preoperatively.  No barriers to understanding were noted. I explained the natural history of the disease process and Rx rationale.  I explained to the patient what I considered to be reasonable expectations given their personal situation.  The final treatment plan was arrived at through a shared  patient decision making process model.   Thank you for the consult and the opportunity to see Mr. Aarjav Schechter, MD [facility] 2:22 PM

## 2023-03-04 NOTE — ED Provider Notes (Signed)
[place] [facility] AT [facility] Provider Note   CSN: [id number] Arrival date & time: 03/04/23  1218     History  Chief Complaint  Patient presents with   Mark Miranda    Makyle Jaskolka is a 52 y.o. male.  HPI    52 year old male comes in with chief complaint of mechanical fall.  Patient was at work, standing on a ladder about 4 feet high when he fell.  He fell onto cement/concrete surface, with the blunt trauma to the right upper body.  Patient denies loss of consciousness.  He did strike his head onto the cement.  He denies neck pain, one-sided weakness, numbness, tingling, chest pain, shortness of breath.  Patient has history of diabetes and double renal/pancreatic transplant.  Home Medications Prior to Admission medications   Medication Sig Start Date End Date Taking? Authorizing Provider  amitriptyline (ELAVIL) 50 MG tablet  05/16/19   [provider]  dapagliflozin propanediol (FARXIGA) 10 MG TABS tablet Take 1 tablet (10 mg total) by mouth daily before breakfast. 10/05/22   [name], [name], MD  lisinopril (ZESTRIL) 10 MG tablet TAKE 1 TABLET BY MOUTH EVERY DAY 08/17/21   [name], [name], MD  mycophenolate (MYFORTIC) 180 MG EC tablet Take 180 mg by mouth 2 (two) times daily.    [provider]  sildenafil (VIAGRA) 100 MG tablet Take 0.5-1 tablets (50-100 mg total) by mouth daily as needed for erectile dysfunction. 10/05/18   [name], [name], MD  tacrolimus (PROGRAF) 1 MG capsule Take 1 mg by mouth. Pt take 5 in the morning and 4 at night    [provider]  diltiazem (CARDIZEM) 30 MG tablet Take 30 mg by mouth 4 (four) times daily. Ask patient to verify dosage and frequency. Not indicated on medical history form dated 10/23/10.   10/30/11  [provider]  insulin lispro (HUMALOG) 100 UNIT/ML injection Inject into the skin 3 (three) times daily before meals. Ask patient to verify name/dosage/. History form noted dosage of 34 units  during day and 22 units at night.   10/30/11  [provider]      Allergies    Other    Review of Systems   Review of Systems  All other systems reviewed and are negative.   Physical Exam Updated Vital Signs BP (!) 104/44   Pulse 70   Resp 12   Ht 5\' 8"  (1.727 m)   Wt 70.3 kg   SpO2 98%   BMI 23.57 kg/m  Physical Exam Vitals and nursing note reviewed.  Constitutional:      Appearance: He is well-developed.  HENT:     Head: Atraumatic.  Cardiovascular:     Rate and Rhythm: Normal rate.  Pulmonary:     Effort: Pulmonary effort is normal.  Musculoskeletal:        General: Swelling, tenderness, deformity and signs of injury present.     Cervical back: Neck supple.  Skin:    General: Skin is warm.  Neurological:     Mental Status: He is alert and oriented to person, place, and time.          ED Results / Procedures / Treatments   Labs (all labs ordered are listed, but only abnormal results are displayed) Labs Reviewed - No data to display  EKG None  Radiology DG Forearm Right  Result Date: 03/04/2023 CLINICAL DATA:  Fall from ladder. EXAM: RIGHT FOREARM - 2 VIEW COMPARISON:  None Available. FINDINGS:  Severely displaced and comminuted distal right humeral fracture is noted. The radius and ulna are unremarkable. Vascular calcifications are noted. IMPRESSION: Severely displaced and comminuted distal right humeral fracture. Electronically Signed   By: Lupita Raider M.D.   On: 03/04/2023 13:36   DG Elbow Complete Right  Result Date: 03/04/2023 CLINICAL DATA:  Fall from ladder. EXAM: RIGHT ELBOW - COMPLETE 3+ VIEW COMPARISON:  None Available. FINDINGS: Severely displaced and comminuted fracture is seen involving the distal right humerus. Visualized portions of proximal radius and ulna are unremarkable. IMPRESSION: Severely displaced and comminuted distal right humeral fracture. Electronically Signed   By: Lupita Raider M.D.   On: 03/04/2023 13:35   DG  Chest Port 1 View  Result Date: 03/04/2023 CLINICAL DATA:  Fall from ladder. EXAM: PORTABLE CHEST 1 VIEW COMPARISON:  March 08, 2012. FINDINGS: The heart size and mediastinal contours are within normal limits. Both lungs are clear. The visualized skeletal structures are unremarkable. IMPRESSION: No active disease. Electronically Signed   By: Lupita Raider M.D.   On: 03/04/2023 13:33   CT Head Wo Contrast  Result Date: 03/04/2023 CLINICAL DATA:  52 year old male status post fall from ladder. EXAM: CT HEAD WITHOUT CONTRAST TECHNIQUE: Contiguous axial images were obtained from the base of the skull through the vertex without intravenous contrast. RADIATION DOSE REDUCTION: This exam was performed according to the departmental dose-optimization program which includes automated exposure control, adjustment of the mA and/or kV according to patient size and/or use of iterative reconstruction technique. COMPARISON:  Head CT 10/30/2011. FINDINGS: Brain: No midline shift, ventriculomegaly, mass effect, evidence of mass lesion, intracranial hemorrhage or evidence of cortically based acute infarction. Gray-white matter differentiation is within normal limits throughout the brain. Vascular: Advanced for age calcified atherosclerosis at the skull base. No suspicious intracranial vascular hyperdensity. Skull: No fracture identified. Sinuses/Orbits: Bilateral paranasal sinus mucosal thickening with bubbly opacity appears to be inflammatory in nature, new since 2013. Tympanic cavities and mastoids remain clear. Other: No discrete orbit or scalp soft tissue injury. No soft tissue gas. IMPRESSION: 1.  No acute traumatic injury identified. 2. Age advanced calcified atherosclerosis at the skull base. But otherwise normal noncontrast CT appearance of the brain. 3. Bilateral paranasal sinus inflammation. Electronically Signed   By: Odessa Fleming M.D.   On: 03/04/2023 12:56    Procedures Wound closure utilizing adhes only  Date/Time:  03/04/2023 2:47 PM  Performed by: Derwood Kaplan, MD Authorized by: Derwood Kaplan, MD  Consent: Verbal consent obtained. Risks and benefits: risks, benefits and alternatives were discussed Consent given by: patient Patient understanding: patient states understanding of the procedure being performed Imaging studies: imaging studies available Required items: required blood products, implants, devices, and special equipment available Patient identity confirmed: arm band Time out: Immediately prior to procedure a "time out" was called to verify the correct patient, procedure, equipment, support staff and site/side marked as required. Preparation: Patient was prepped and draped in the usual sterile fashion. Local anesthesia used: no  Anesthesia: Local anesthesia used: no  Sedation: Patient sedated: no  Patient tolerance: patient tolerated the procedure well with no immediate complications Comments: Wound cleaned with alcohol.  Patient has a 8 cm skin abrasion, superficial, skin tear. Steri-Strips were applied.       Medications Ordered in ED Medications  fentaNYL (SUBLIMAZE) injection 50 mcg (has no administration in time range)  ceFAZolin (ANCEF) IVPB 2g/100 mL premix (2 g Intravenous New Bag/Given 03/04/23 1404)  fentaNYL (SUBLIMAZE) injection 50 mcg (50  mcg Intravenous Given 03/04/23 1401)  Tdap (BOOSTRIX) injection 0.5 mL (0.5 mLs Intramuscular Given 03/04/23 1401)    ED Course/ Medical Decision Making/ A&P Clinical Course as of 03/04/23 1447  Sat Mar 04, 2023  1352 DG Elbow Complete Right I reviewed the x-ray.  Patient has comminuted fracture of the elbow/distal humerus.  Thereafter, I went on to assess the patient and remove the Curlex wrap.  There is clear evidence of puncture wound around the site of the fracture.  Now am concerned that patient might have open fracture.  I have consulted Dr. Blain Pais -will come and see the patient.  Ancef and tetanus ordered. [AN]     Clinical Course User Index [AN] Derwood Kaplan, MD                             Medical Decision Making Amount and/or Complexity of Data Reviewed Radiology: ordered. Decision-making details documented in ED Course.  Risk Prescription drug management.   52 year old male comes in with chief complaint of mechanical fall.  He fell from a ladder about 4 feet high.  He is noted to have deformity to the right elbow, there is also bleeding from right upper extremity.  Patient also has evidence of hematoma around his right eye.  Based on the evaluation, differential diagnosis includes traumatic subdural hematoma, traumatic subarachnoid hemorrhage, facial fracture, facial contusion, elbow fracture, open fracture of the elbow, elbow dislocation.  X-ray of the upper extremity ordered along with CT scan of the brain.  I independently interpreted patient's x-ray of the elbow.  There is clear evidence of comminuted fracture.  Upon further assessment, there is concerns for puncture wound possibly leading to open fracture.  Orthopedic surgery was consulted, Dr. Blain Pais saw the patient immediately and suspects that most likely this is tension wound and not an open fracture.  Plan is to apply appropriate dressing and splint the patient.  Orthopedic surgery will contact the patient and set up an OR time later this week.  Final Clinical Impression(s) / ED Diagnoses Final diagnoses:  Closed fracture of right elbow, initial encounter    Rx / DC Orders ED Discharge Orders     None         Derwood Kaplan, MD 03/04/23 1451

## 2023-03-06 ENCOUNTER — Encounter
Admission: RE | Admit: 2023-03-06 | Discharge: 2023-03-06 | Disposition: A | Discharge status: Home / Self Care | Payer: Medicaid Other | Source: Ambulatory Visit | Attending: Orthopaedic Surgery | Admitting: Orthopaedic Surgery

## 2023-03-06 ENCOUNTER — Ambulatory Visit (HOSPITAL_BASED_OUTPATIENT_CLINIC_OR_DEPARTMENT_OTHER): Payer: Self-pay | Admitting: Orthopaedic Surgery

## 2023-03-06 VITALS — Ht 68.0 in | Wt 155.0 lb

## 2023-03-06 DIAGNOSIS — S42491A Other displaced fracture of lower end of right humerus, initial encounter for closed fracture: Secondary | ICD-10-CM

## 2023-03-06 DIAGNOSIS — Z01812 Encounter for preprocedural laboratory examination: Secondary | ICD-10-CM

## 2023-03-06 DIAGNOSIS — I10 Essential (primary) hypertension: Secondary | ICD-10-CM

## 2023-03-06 DIAGNOSIS — N186 End stage renal disease: Secondary | ICD-10-CM

## 2023-03-06 HISTORY — DX: Acidosis, unspecified: E87.20

## 2023-03-06 HISTORY — DX: End stage renal disease: N18.6

## 2023-03-06 HISTORY — DX: Male erectile dysfunction, unspecified: N52.9

## 2023-03-06 HISTORY — DX: Vitamin D deficiency, unspecified: E55.9

## 2023-03-06 HISTORY — DX: Type 1 diabetes mellitus with other diabetic kidney complication: E10.29

## 2023-03-06 HISTORY — DX: Peritonitis, unspecified: K65.9

## 2023-03-06 HISTORY — DX: Other viral infections of unspecified site: B34.8

## 2023-03-06 MED ORDER — TRANEXAMIC ACID-NACL 1000-0.7 MG/100ML-% IV SOLN
1000.0000 mg | INTRAVENOUS | Status: AC
  Administered 2023-03-07: 1000 mg via INTRAVENOUS

## 2023-03-06 MED ORDER — CHLORHEXIDINE GLUCONATE 0.12 % MT SOLN
15.0000 mL | Freq: Once | OROMUCOSAL | Status: AC
  Administered 2023-03-07: 15 mL via OROMUCOSAL

## 2023-03-06 MED ORDER — CEFAZOLIN SODIUM-DEXTROSE 2-4 GM/100ML-% IV SOLN
2.0000 g | INTRAVENOUS | Status: AC
  Administered 2023-03-07: 2 g via INTRAVENOUS

## 2023-03-06 MED ORDER — ORAL CARE MOUTH RINSE: 15.0000 mL | Freq: Once | OROMUCOSAL | Status: AC

## 2023-03-06 MED ORDER — ACETAMINOPHEN 500 MG PO TABS
1000.0000 mg | ORAL_TABLET | Freq: Once | ORAL | Status: AC
  Administered 2023-03-07: 1000 mg via ORAL

## 2023-03-06 MED ORDER — FAMOTIDINE 20 MG PO TABS
20.0000 mg | ORAL_TABLET | Freq: Once | ORAL | Status: AC
  Administered 2023-03-07: 20 mg via ORAL

## 2023-03-06 MED ORDER — SODIUM CHLORIDE 0.9 % IV SOLN: INTRAVENOUS | Status: DC

## 2023-03-06 MED ORDER — GABAPENTIN 300 MG PO CAPS
300.0000 mg | ORAL_CAPSULE | Freq: Once | ORAL | Status: AC
  Administered 2023-03-07: 300 mg via ORAL

## 2023-03-06 NOTE — Patient Instructions (Addendum)
Your procedure is scheduled on: Tuesday, July 30 Report to the Registration Desk on the 1st floor of the [facility] at 6 am  REMEMBER: Instructions that are not followed completely may result in serious medical risk, up to and including death; or upon the discretion of your surgeon and anesthesiologist your surgery may need to be rescheduled.  Do not eat food after midnight the night before surgery.  No gum chewing or hard candies.  You may however, drink water up to 2 hours before you are scheduled to arrive for your surgery. Do not drink anything within 2 hours of your scheduled arrival time.  One week prior to surgery: Stop Anti-inflammatories (NSAIDS) such as Advil, Aleve, Ibuprofen, Motrin, Naproxen, Naprosyn and Aspirin based products such as Excedrin, Goody's Powder, BC Powder. Stop ANY OVER THE COUNTER supplements until after surgery. You may however, continue to take Tylenol if needed for pain up until the day of surgery.  Continue taking all prescribed medications with the exception of the following:  Stop farxiga and sildenafil now.  TAKE ONLY THESE MEDICATIONS THE MORNING OF SURGERY WITH A SIP OF WATER:  Mycophenolate Tacrolimus  No Alcohol for 24 hours before or after surgery.  No Smoking including e-cigarettes for 24 hours before surgery.  No chewable tobacco products for at least 6 hours before surgery.  No nicotine patches on the day of surgery.  Do not use any "recreational" drugs for at least a week (preferably 2 weeks) before your surgery.  Please be advised that the combination of cocaine and anesthesia may have negative outcomes, up to and including death. If you test positive for cocaine, your surgery will be cancelled.  On the morning of surgery brush your teeth with toothpaste and water, you may rinse your mouth with mouthwash if you wish. Do not swallow any toothpaste or mouthwash.  Do not wear jewelry, make-up, hairpins, clips or nail polish.  Do  not wear lotions, powders, or perfumes.   Do not shave body hair from the neck down 48 hours before surgery.  Contact lenses, hearing aids and dentures may not be worn into surgery.  Do not bring valuables to the hospital. [facility] is not responsible for any missing/lost belongings or valuables.   Notify your doctor if there is any change in your medical condition (cold, fever, infection).  Wear comfortable clothing (specific to your surgery type) to the hospital.  After surgery, you can help prevent lung complications by doing breathing exercises.  Take deep breaths and cough every 1-2 hours.  If you are being discharged the day of surgery, you will not be allowed to drive home. You will need a responsible individual to drive you home and stay with you for 24 hours after surgery.   If you are taking public transportation, you will need to have a responsible individual with you.  Please call the Pre-admissions Testing Dept. at [phone] if you have any questions about these instructions.  Surgery Visitation Policy:  Patients having surgery or a procedure may have two visitors.  Children under the age of 72 must have an adult with them who is not the patient.

## 2023-03-07 ENCOUNTER — Ambulatory Visit
Admission: RE | Admit: 2023-03-07 | Payer: Worker's Compensation | Source: Home / Self Care | Admitting: Orthopaedic Surgery

## 2023-03-07 ENCOUNTER — Other Ambulatory Visit: Payer: Self-pay

## 2023-03-07 ENCOUNTER — Telehealth (HOSPITAL_BASED_OUTPATIENT_CLINIC_OR_DEPARTMENT_OTHER): Payer: Self-pay | Admitting: Orthopaedic Surgery

## 2023-03-07 ENCOUNTER — Encounter (HOSPITAL_BASED_OUTPATIENT_CLINIC_OR_DEPARTMENT_OTHER): Payer: Self-pay | Admitting: Orthopaedic Surgery

## 2023-03-07 ENCOUNTER — Encounter: Payer: Self-pay | Admitting: Orthopaedic Surgery

## 2023-03-07 ENCOUNTER — Ambulatory Visit: Payer: Worker's Compensation

## 2023-03-07 ENCOUNTER — Other Ambulatory Visit (HOSPITAL_BASED_OUTPATIENT_CLINIC_OR_DEPARTMENT_OTHER): Payer: Self-pay | Admitting: Orthopaedic Surgery

## 2023-03-07 ENCOUNTER — Ambulatory Visit: Payer: Worker's Compensation | Admitting: Anesthesiology

## 2023-03-07 ENCOUNTER — Ambulatory Visit: Payer: Medicaid Other

## 2023-03-07 ENCOUNTER — Encounter
Admission: RE | Disposition: A | Discharge status: Home / Self Care | Payer: Self-pay | Source: Home / Self Care | Attending: Orthopaedic Surgery

## 2023-03-07 DIAGNOSIS — Z940 Kidney transplant status: Secondary | ICD-10-CM | POA: Insufficient documentation

## 2023-03-07 DIAGNOSIS — Z01812 Encounter for preprocedural laboratory examination: Secondary | ICD-10-CM

## 2023-03-07 DIAGNOSIS — E1042 Type 1 diabetes mellitus with diabetic polyneuropathy: Secondary | ICD-10-CM | POA: Insufficient documentation

## 2023-03-07 DIAGNOSIS — I10 Essential (primary) hypertension: Secondary | ICD-10-CM

## 2023-03-07 DIAGNOSIS — N186 End stage renal disease: Secondary | ICD-10-CM | POA: Insufficient documentation

## 2023-03-07 DIAGNOSIS — I120 Hypertensive chronic kidney disease with stage 5 chronic kidney disease or end stage renal disease: Secondary | ICD-10-CM | POA: Diagnosis not present

## 2023-03-07 DIAGNOSIS — E1122 Type 2 diabetes mellitus with diabetic chronic kidney disease: Secondary | ICD-10-CM

## 2023-03-07 DIAGNOSIS — S42491A Other displaced fracture of lower end of right humerus, initial encounter for closed fracture: Secondary | ICD-10-CM

## 2023-03-07 DIAGNOSIS — S42411A Displaced simple supracondylar fracture without intercondylar fracture of right humerus, initial encounter for closed fracture: Secondary | ICD-10-CM | POA: Insufficient documentation

## 2023-03-07 DIAGNOSIS — F17210 Nicotine dependence, cigarettes, uncomplicated: Secondary | ICD-10-CM | POA: Diagnosis not present

## 2023-03-07 DIAGNOSIS — S46311A Strain of muscle, fascia and tendon of triceps, right arm, initial encounter: Secondary | ICD-10-CM | POA: Diagnosis not present

## 2023-03-07 DIAGNOSIS — W11XXXA Fall on and from ladder, initial encounter: Secondary | ICD-10-CM | POA: Diagnosis not present

## 2023-03-07 DIAGNOSIS — Z7984 Long term (current) use of oral hypoglycemic drugs: Secondary | ICD-10-CM | POA: Diagnosis not present

## 2023-03-07 DIAGNOSIS — E7800 Pure hypercholesterolemia, unspecified: Secondary | ICD-10-CM | POA: Insufficient documentation

## 2023-03-07 DIAGNOSIS — S42401A Unspecified fracture of lower end of right humerus, initial encounter for closed fracture: Secondary | ICD-10-CM

## 2023-03-07 DIAGNOSIS — Z9483 Pancreas transplant status: Secondary | ICD-10-CM | POA: Insufficient documentation

## 2023-03-07 DIAGNOSIS — Z794 Long term (current) use of insulin: Secondary | ICD-10-CM | POA: Diagnosis not present

## 2023-03-07 DIAGNOSIS — E1022 Type 1 diabetes mellitus with diabetic chronic kidney disease: Secondary | ICD-10-CM | POA: Insufficient documentation

## 2023-03-07 HISTORY — PX: ULNAR NERVE TRANSPOSITION: SHX2595

## 2023-03-07 HISTORY — PX: TRICEPS TENDON REPAIR: SHX2577

## 2023-03-07 HISTORY — PX: ORIF HUMERUS FRACTURE: SHX2126

## 2023-03-07 LAB — BASIC METABOLIC PANEL
Anion gap: 9 (ref 5–15)
BUN: 20 mg/dL (ref 6–20)
CO2: 22 mmol/L (ref 22–32)
Calcium: 8.8 mg/dL — ABNORMAL LOW (ref 8.9–10.3)
Chloride: 106 mmol/L (ref 98–111)
Creatinine, Ser: 0.81 mg/dL (ref 0.61–1.24)
GFR, Estimated: >60 mL/min (ref >60)
Glucose, Bld: 136 mg/dL — ABNORMAL HIGH (ref 70–99)
Potassium: 4.8 mmol/L (ref 3.5–5.1)
Sodium: 137 mmol/L (ref 135–145)

## 2023-03-07 LAB — CBC
HCT: 42.4 % (ref 39.0–52.0)
Hemoglobin: 14.3 g/dL (ref 13.0–17.0)
MCH: 31.8 pg (ref 26.0–34.0)
MCHC: 33.7 g/dL (ref 30.0–36.0)
MCV: 94.2 fL (ref 80.0–100.0)
Platelets: 212 10*3/uL (ref 150–400)
RBC: 4.5 MIL/uL (ref 4.22–5.81)
RDW: 13.9 % (ref 11.5–15.5)
WBC: 13.9 10*3/uL — ABNORMAL HIGH (ref 4.0–10.5)
nRBC: 0 % (ref 0.0–0.2)

## 2023-03-07 LAB — GLUCOSE, CAPILLARY
Glucose-Capillary: 134 mg/dL — ABNORMAL HIGH (ref 70–99)
Glucose-Capillary: 169 mg/dL — ABNORMAL HIGH (ref 70–99)

## 2023-03-07 SURGERY — OPEN REDUCTION INTERNAL FIXATION (ORIF) DISTAL HUMERUS FRACTURE
Anesthesia: General | Laterality: Right

## 2023-03-07 MED ORDER — PROPOFOL 10 MG/ML IV BOLUS
INTRAVENOUS | Status: DC | PRN
  Administered 2023-03-07: 40 mg via INTRAVENOUS
  Administered 2023-03-07: 160 mg via INTRAVENOUS

## 2023-03-07 MED ORDER — ONDANSETRON HCL 4 MG/2ML IJ SOLN: 4.0000 mg | Freq: Once | INTRAMUSCULAR | Status: DC | PRN

## 2023-03-07 MED ORDER — DEXAMETHASONE SODIUM PHOSPHATE 4 MG/ML IJ SOLN
INTRAMUSCULAR | Status: DC | PRN
Start: 2023-03-07 — End: 2023-03-07
  Administered 2023-03-07: 4 mg

## 2023-03-07 MED ORDER — IBUPROFEN 800 MG PO TABS: 800.0000 mg | ORAL_TABLET | Freq: Three times a day (TID) | ORAL | 0 refills | Status: AC

## 2023-03-07 MED ORDER — LIDOCAINE HCL (CARDIAC) PF 100 MG/5ML IV SOSY
PREFILLED_SYRINGE | INTRAVENOUS | Status: DC | PRN
  Administered 2023-03-07: 50 mg via INTRAVENOUS

## 2023-03-07 MED ORDER — TRANEXAMIC ACID-NACL 1000-0.7 MG/100ML-% IV SOLN
INTRAVENOUS | Status: AC
  Filled 2023-03-07: qty 100

## 2023-03-07 MED ORDER — ASPIRIN 325 MG PO TBEC: 325.0000 mg | DELAYED_RELEASE_TABLET | Freq: Every day | ORAL | 0 refills | Status: DC

## 2023-03-07 MED ORDER — MIDAZOLAM HCL 2 MG/2ML IJ SOLN
INTRAMUSCULAR | Status: AC
  Filled 2023-03-07: qty 2

## 2023-03-07 MED ORDER — DEXAMETHASONE SODIUM PHOSPHATE 10 MG/ML IJ SOLN
INTRAMUSCULAR | Status: AC
  Filled 2023-03-07: qty 1

## 2023-03-07 MED ORDER — FENTANYL CITRATE (PF) 100 MCG/2ML IJ SOLN
INTRAMUSCULAR | Status: DC | PRN
  Administered 2023-03-07 (×2): 25 ug via INTRAVENOUS
  Administered 2023-03-07: 50 ug via INTRAVENOUS

## 2023-03-07 MED ORDER — FAMOTIDINE 20 MG PO TABS
ORAL_TABLET | ORAL | Status: AC
  Filled 2023-03-07: qty 1

## 2023-03-07 MED ORDER — GABAPENTIN 300 MG PO CAPS
ORAL_CAPSULE | ORAL | Status: AC
  Filled 2023-03-07: qty 1

## 2023-03-07 MED ORDER — BUPIVACAINE HCL (PF) 0.5 % IJ SOLN
INTRAMUSCULAR | Status: DC | PRN
  Administered 2023-03-07: 20 mL

## 2023-03-07 MED ORDER — OXYCODONE HCL 5 MG/5ML PO SOLN: 5.0000 mg | Freq: Once | ORAL | Status: DC | PRN

## 2023-03-07 MED ORDER — EPHEDRINE SULFATE (PRESSORS) 50 MG/ML IJ SOLN
INTRAMUSCULAR | Status: DC | PRN
  Administered 2023-03-07: 10 mg via INTRAVENOUS
  Administered 2023-03-07: 15 mg via INTRAVENOUS

## 2023-03-07 MED ORDER — SUGAMMADEX SODIUM 500 MG/5ML IV SOLN
INTRAVENOUS | Status: DC | PRN
  Administered 2023-03-07: 200 mg via INTRAVENOUS

## 2023-03-07 MED ORDER — HYDROMORPHONE HCL 1 MG/ML IJ SOLN
INTRAMUSCULAR | Status: DC | PRN
  Administered 2023-03-07: .5 mg via INTRAVENOUS

## 2023-03-07 MED ORDER — PHENYLEPHRINE HCL (PRESSORS) 10 MG/ML IV SOLN
INTRAVENOUS | Status: DC | PRN
  Administered 2023-03-07: 80 ug via INTRAVENOUS
  Administered 2023-03-07: 40 ug via INTRAVENOUS
  Administered 2023-03-07: 120 ug via INTRAVENOUS
  Administered 2023-03-07 (×4): 80 ug via INTRAVENOUS

## 2023-03-07 MED ORDER — EPHEDRINE 5 MG/ML INJ
INTRAVENOUS | Status: AC
  Filled 2023-03-07: qty 5

## 2023-03-07 MED ORDER — ONDANSETRON HCL 4 MG/2ML IJ SOLN
INTRAMUSCULAR | Status: DC | PRN
  Administered 2023-03-07: 4 mg via INTRAVENOUS

## 2023-03-07 MED ORDER — DEXAMETHASONE SODIUM PHOSPHATE 10 MG/ML IJ SOLN: 10.0000 mg | Freq: Once | INTRAMUSCULAR | Status: DC

## 2023-03-07 MED ORDER — BUPIVACAINE HCL (PF) 0.5 % IJ SOLN
INTRAMUSCULAR | Status: AC
  Filled 2023-03-07: qty 20

## 2023-03-07 MED ORDER — OXYCODONE HCL 5 MG PO TABS: 5.0000 mg | ORAL_TABLET | ORAL | 0 refills | Status: DC | PRN

## 2023-03-07 MED ORDER — VANCOMYCIN HCL 1000 MG IV SOLR
INTRAVENOUS | Status: DC | PRN
  Administered 2023-03-07: 1000 mg

## 2023-03-07 MED ORDER — FENTANYL CITRATE (PF) 100 MCG/2ML IJ SOLN: 25.0000 ug | INTRAMUSCULAR | Status: DC | PRN

## 2023-03-07 MED ORDER — FENTANYL CITRATE (PF) 100 MCG/2ML IJ SOLN
INTRAMUSCULAR | Status: AC
  Filled 2023-03-07: qty 2

## 2023-03-07 MED ORDER — CEFAZOLIN SODIUM-DEXTROSE 2-4 GM/100ML-% IV SOLN
INTRAVENOUS | Status: AC
  Filled 2023-03-07: qty 100

## 2023-03-07 MED ORDER — ROCURONIUM BROMIDE 10 MG/ML (PF) SYRINGE
PREFILLED_SYRINGE | INTRAVENOUS | Status: AC
  Filled 2023-03-07: qty 10

## 2023-03-07 MED ORDER — ACETAMINOPHEN 500 MG PO TABS
ORAL_TABLET | ORAL | Status: AC
  Filled 2023-03-07: qty 2

## 2023-03-07 MED ORDER — PROPOFOL 10 MG/ML IV BOLUS
INTRAVENOUS | Status: AC
  Filled 2023-03-07: qty 40

## 2023-03-07 MED ORDER — OXYCODONE HCL 5 MG PO TABS: 5.0000 mg | ORAL_TABLET | Freq: Once | ORAL | Status: DC | PRN

## 2023-03-07 MED ORDER — MIDAZOLAM HCL 2 MG/2ML IJ SOLN
2.0000 mg | Freq: Once | INTRAMUSCULAR | Status: AC
  Administered 2023-03-07: 2 mg via INTRAVENOUS

## 2023-03-07 MED ORDER — PHENYLEPHRINE 80 MCG/ML (10ML) SYRINGE FOR IV PUSH (FOR BLOOD PRESSURE SUPPORT)
PREFILLED_SYRINGE | INTRAVENOUS | Status: AC
  Filled 2023-03-07: qty 10

## 2023-03-07 MED ORDER — ONDANSETRON HCL 4 MG/2ML IJ SOLN
INTRAMUSCULAR | Status: AC
  Filled 2023-03-07: qty 2

## 2023-03-07 MED ORDER — HYDROMORPHONE HCL 1 MG/ML IJ SOLN
INTRAMUSCULAR | Status: AC
  Filled 2023-03-07: qty 1

## 2023-03-07 MED ORDER — ACETAMINOPHEN 500 MG PO TABS: 500.0000 mg | ORAL_TABLET | Freq: Three times a day (TID) | ORAL | 0 refills | Status: AC

## 2023-03-07 MED ORDER — ROCURONIUM BROMIDE 100 MG/10ML IV SOLN
INTRAVENOUS | Status: DC | PRN
  Administered 2023-03-07: 55 mg via INTRAVENOUS
  Administered 2023-03-07: 20 mg via INTRAVENOUS
  Administered 2023-03-07 (×2): 15 mg via INTRAVENOUS

## 2023-03-07 MED ORDER — 0.9 % SODIUM CHLORIDE (POUR BTL) OPTIME
TOPICAL | Status: DC | PRN
  Administered 2023-03-07: 1000 mL

## 2023-03-07 MED ORDER — BACITRACIN ZINC 500 UNIT/GM EX OINT
TOPICAL_OINTMENT | CUTANEOUS | Status: AC
  Filled 2023-03-07: qty 28.35

## 2023-03-07 MED ORDER — VANCOMYCIN HCL 1000 MG IV SOLR
INTRAVENOUS | Status: AC
  Filled 2023-03-07: qty 20

## 2023-03-07 MED ORDER — PHENYLEPHRINE HCL-NACL 20-0.9 MG/250ML-% IV SOLN
INTRAVENOUS | Status: DC | PRN
  Administered 2023-03-07: 15 ug/min via INTRAVENOUS

## 2023-03-07 MED ORDER — PHENYLEPHRINE HCL-NACL 20-0.9 MG/250ML-% IV SOLN
INTRAVENOUS | Status: AC
  Filled 2023-03-07: qty 250

## 2023-03-07 MED ORDER — CHLORHEXIDINE GLUCONATE 0.12 % MT SOLN
OROMUCOSAL | Status: AC
  Filled 2023-03-07: qty 15

## 2023-03-07 SURGICAL SUPPLY — 79 items
BAG COUNTER SPONGE SURGICOUNT (BAG) ×2 IMPLANT
BAG SPNG CNTER NS LX DISP (BAG) ×2
BIT DRILL 2.5 (BIT)
BIT DRILL QC 2.0 SHORT EVOS SM (DRILL) IMPLANT
BIT DRILL QC 2.5MM SHRT EVO SM (DRILL) IMPLANT
BIT DRILL STRG 12.7X2.5NS (BIT) IMPLANT
BIT DRL STRG 12.7X2.5NS (BIT)
BNDG CMPR 5X4 KNIT ELC UNQ LF (GAUZE/BANDAGES/DRESSINGS) ×4
BNDG ELASTIC 4INX 5YD STR LF (GAUZE/BANDAGES/DRESSINGS) IMPLANT
BNDG GAUZE DERMACEA FLUFF 4 (GAUZE/BANDAGES/DRESSINGS) ×4 IMPLANT
BNDG GZE DERMACEA 4 6PLY (GAUZE/BANDAGES/DRESSINGS) ×4
BRUSH SCRUB EZ PLAIN DRY (MISCELLANEOUS) ×4 IMPLANT
CLEANER TIP ELECTROSURG 2X2 (MISCELLANEOUS) ×2 IMPLANT
CORD BIPOLAR FORCEPS 12FT (ELECTRODE) ×2 IMPLANT
COVER MAYO STAND STRL (DRAPES) IMPLANT
COVER SURGICAL LIGHT HANDLE (MISCELLANEOUS) ×2 IMPLANT
DRAIN PENROSE 12X.25 LTX STRL (MISCELLANEOUS) IMPLANT
DRAPE C-ARM 42X72 X-RAY (DRAPES) ×2 IMPLANT
DRAPE HALF SHEET 40X57 (DRAPES) ×2 IMPLANT
DRAPE INCISE IOBAN 66X45 STRL (DRAPES) IMPLANT
DRAPE ORTHO SPLIT 77X108 STRL (DRAPES) ×2
DRAPE SURG ORHT 6 SPLT 77X108 (DRAPES) ×2 IMPLANT
DRAPE U-SHAPE 47X51 STRL (DRAPES) ×2 IMPLANT
DRILL QC 2.0 SHORT EVOS SM (DRILL) ×4
DRILL QC 2.5MM SHORT EVOS SM (DRILL) ×2
DRSG ADAPTIC 3X8 NADH LF (GAUZE/BANDAGES/DRESSINGS) ×2 IMPLANT
DRSG TEGADERM 4X10 (GAUZE/BANDAGES/DRESSINGS) IMPLANT
ELECT REM PT RETURN 9FT ADLT (ELECTROSURGICAL) ×2
ELECTRODE REM PT RTRN 9FT ADLT (ELECTROSURGICAL) ×2 IMPLANT
EVACUATOR 1/8 PVC DRAIN (DRAIN) IMPLANT
GAUZE PAD ABD 8X10 STRL (GAUZE/BANDAGES/DRESSINGS) ×2 IMPLANT
GAUZE SPONGE 4X4 12PLY STRL (GAUZE/BANDAGES/DRESSINGS) ×4 IMPLANT
GLOVE BIOGEL PI IND STRL 6.5 (GLOVE) ×2 IMPLANT
GLOVE BIOGEL PI IND STRL 8 (GLOVE) ×2 IMPLANT
GLOVE INDICATOR 8.0 STRL GRN (GLOVE) ×2 IMPLANT
GOWN STRL REUS W/ TWL LRG LVL3 (GOWN DISPOSABLE) ×6 IMPLANT
GOWN STRL REUS W/ TWL XL LVL3 (GOWN DISPOSABLE) ×2 IMPLANT
GOWN STRL REUS W/TWL LRG LVL3 (GOWN DISPOSABLE) ×6
GOWN STRL REUS W/TWL XL LVL3 (GOWN DISPOSABLE) ×2
K-WIRE 1.6 (WIRE) ×6
K-WIRE FX150X1.6XTROC PNT (WIRE) ×6
KIT BASIN OR (CUSTOM PROCEDURE TRAY) ×2 IMPLANT
KIT TURNOVER KIT B (KITS) ×2 IMPLANT
KWIRE FX150X1.6XTROC PNT (WIRE) IMPLANT
MANIFOLD NEPTUNE II (INSTRUMENTS) ×2 IMPLANT
MAT ABSORB FLUID 56X50 GRAY (MISCELLANEOUS) IMPLANT
NDL HYPO 25X1 1.5 SAFETY (NEEDLE) IMPLANT
NEEDLE HYPO 25X1 1.5 SAFETY (NEEDLE) IMPLANT
NS IRRIG 1000ML POUR BTL (IV SOLUTION) ×2 IMPLANT
PACK ARTHROSCOPY SHOULDER (MISCELLANEOUS) ×2 IMPLANT
PAD ABD DERMACEA PRESS 5X9 (GAUZE/BANDAGES/DRESSINGS) IMPLANT
PAD ARMBOARD 7.5X6 YLW CONV (MISCELLANEOUS) ×4 IMPLANT
PLATE HUM EVOS 2.7X80 3H (Plate) IMPLANT
PLATE HUM EVOS 6H R 2.7X85 (Plate) IMPLANT
SCREW CORT 2.7X18 T8 ST EVOS (Screw) IMPLANT
SCREW CORT 2.7X22 T8 ST EVOS (Screw) IMPLANT
SCREW CORT 2.7X38 T8 ST EVOS (Screw) IMPLANT
SCREW CORT 2.7X42 ST VA EVOS (Screw) IMPLANT
SCREW CORT 3.5X20 ST EVOS (Screw) IMPLANT
SCREW CORT 3.5X22 ST EVOS (Screw) IMPLANT
SCREW CORT 3.5X30 ST EVOS (Screw) IMPLANT
SCREW CORT EVOS ST 3.5X28 (Screw) IMPLANT
SCREW CORT VA EVOS 2.7X36 (Screw) IMPLANT
SCREW CORT VA EVOS 2.7X44 (Screw) IMPLANT
SCREW CORT VA EVOS 2.7X50 (Screw) IMPLANT
SPONGE T-LAP 18X18 ~~LOC~~+RFID (SPONGE) IMPLANT
STAPLER SKIN PROX 35W (STAPLE) ×2 IMPLANT
STOCKINETTE IMPERVIOUS 9X36 MD (GAUZE/BANDAGES/DRESSINGS) ×2 IMPLANT
SUCTION TUBE FRAZIER 10FR DISP (SUCTIONS) ×2 IMPLANT
SUT ETHILON 3 0 PS 1 (SUTURE) ×4 IMPLANT
SUT VIC AB 0 CT1 27 (SUTURE)
SUT VIC AB 0 CT1 27XBRD ANBCTR (SUTURE) ×4 IMPLANT
SUT VIC AB 0 CT1 36 (SUTURE) IMPLANT
SUT VIC AB 2-0 CT1 27 (SUTURE) ×4
SUT VIC AB 2-0 CT1 TAPERPNT 27 (SUTURE) ×4 IMPLANT
SYR 5ML LL (SYRINGE) IMPLANT
SYR CONTROL 10ML LL (SYRINGE) ×2 IMPLANT
WATER STERILE IRR 1000ML POUR (IV SOLUTION) ×2 IMPLANT
YANKAUER SUCT BULB TIP NO VENT (SUCTIONS) IMPLANT

## 2023-03-07 NOTE — Anesthesia Procedure Notes (Signed)
Anesthesia Regional Block: Supraclavicular block   Pre-Anesthetic Checklist: , timeout performed,  Correct Patient, Correct Site, Correct Laterality,  Correct Procedure, Correct Position, site marked,  Risks and benefits discussed,  Surgical consent,  Pre-op evaluation,  At surgeon's request and post-op pain management  Laterality: Right  Prep: chloraprep       Needles:  Injection technique: Single-shot  Needle Type: Echogenic Needle     Needle Length: 4cm  Needle Gauge: 25     Additional Needles:   Procedures:,,,, ultrasound used (permanent image in chart),,    Narrative:  Injection made incrementally with aspirations every 5 mL.  Performed by: Personally  Anesthesiologist: Corinda Gubler, MD  Additional Notes: Patient's chart reviewed and they were deemed appropriate candidate for procedure, per surgeon's request. Patient educated about risks, benefits, and alternatives of the block including but not limited to: temporary or permanent nerve damage, hemidiaphragmatic paralysis leading to dyspnea, pneumothorax, bleeding, infection, damage to surround tissues, block failure, local anesthetic toxicity. Patient expressed understanding. A formal time-out was conducted consistent with institution rules.  Monitors were applied, and minimal sedation used (see nursing record). The site was prepped with skin prep and allowed to dry, and sterile gloves were used. A high frequency linear ultrasound probe with probe cover was utilized throughout. Supraclavicular artery visualized, along with the brachial plexus adjacent to it and appeared anatomically normal. Local anesthetic injected around the plexus, and echogenic block needle trajectory was monitored throughout. Aspiration performed every 5ml. Lung and blood vessels were avoided. All injections were performed without resistance and free of blood and paresthesias. The patient tolerated the procedure well.  Injectate: 20ml 0.5% bupivacaine + 4mg   dexamethasone

## 2023-03-07 NOTE — Transfer of Care (Signed)
Immediate Anesthesia Transfer of Care Note  Patient: Mark Miranda  Procedure(s) Performed: OPEN REDUCTION INTERNAL FIXATION (ORIF) DISTAL HUMERUS FRACTURE (Right) ULNAR NERVE EXPLORATION (Right) TRICEPS TENDON REPAIR  Patient Location: PACU  Anesthesia Type:General  Level of Consciousness: awake, alert , oriented, and patient cooperative  Airway & Oxygen Therapy: Patient Spontanous Breathing  Post-op Assessment: Report given to RN and Post -op Vital signs reviewed and stable  Post vital signs: Reviewed and stable  Last Vitals:  Vitals Value Taken Time  BP 134/78 03/07/23 1100  Temp 37.1 C 03/07/23 1100  Pulse 87 03/07/23 1103  Resp 18 03/07/23 1103  SpO2 97 % 03/07/23 1103  Vitals shown include unfiled device data.  Last Pain:  Vitals:   03/07/23 0623  TempSrc: Temporal  PainSc: 2          Complications: No notable events documented.

## 2023-03-07 NOTE — Anesthesia Procedure Notes (Signed)
Procedure Name: Intubation Date/Time: 03/07/2023 7:42 AM  Performed by: Jeannene Patella, CRNAPre-anesthesia Checklist: Patient identified, Emergency Drugs available, Suction available, Patient being monitored and Timeout performed Patient Re-evaluated:Patient Re-evaluated prior to induction Oxygen Delivery Method: Circle system utilized Preoxygenation: Pre-oxygenation with 100% oxygen Induction Type: IV induction Ventilation: Mask ventilation without difficulty Laryngoscope Size: McGraph and 4 Grade View: Grade I Tube type: Oral Tube size: 7.5 mm Airway Equipment and Method: Stylet, LTA kit utilized and Video-laryngoscopy Placement Confirmation: ETT inserted through vocal cords under direct vision, positive ETCO2 and breath sounds checked- equal and bilateral Secured at: 21 cm Tube secured with: Tape Dental Injury: Teeth and Oropharynx as per pre-operative assessment

## 2023-03-07 NOTE — Telephone Encounter (Signed)
Patient wife need a letter for work because her husband had surgery. The note need to be for today and tomorrow. Best contact number [phone]

## 2023-03-07 NOTE — Op Note (Signed)
Date of Surgery: 03/07/2023  INDICATIONS: Mark Miranda is a 52 y.o.-year-old male with a distal humeral fracture in the setting of a fall from a ladder.  The risk and benefits of the procedure were discussed in detail and documented in the pre-operative evaluation.   PREOPERATIVE DIAGNOSIS: 1. Right distal humeral supracondylar fracture   POSTOPERATIVE DIAGNOSIS: Same with right elbow triceps tear  PROCEDURE: 1. Right distal humeral open reduction internal fixation 2. Right ulnar nerve neuroplasty 3. Right triceps tendon repair  SURGEON: Benancio Deeds MD  ASSISTANT: Kerby Less, ATC  ANESTHESIA:  general plus interscalene nerve block  IV FLUIDS AND URINE: See anesthesia record.  ANTIBIOTICS: Ancef  ESTIMATED BLOOD LOSS: 100 mL.  IMPLANTS:  Implant Name Type Inv. Item Serial No. Manufacturer Lot No. LRB No. Used Action  PLATE HUM EVOS 2.1H08 3H - [id number] Plate PLATE HUM EVOS 5.2W41 3H  [employer]  Right 1 Implanted  SCREW CORT VA EVOS 2.7X26 - [id number] Screw SCREW CORT VA EVOS 2.7X26  [employer]  Right 1 Explanted  SCREW CORT VA EVOS 2.7X50 - [id number] Screw SCREW CORT VA EVOS 2.7X50  [employer]  Right 1 Implanted  2.7 X 18 Cortex Scre    [employer]  Right 1 Implanted  SCREW CORT VA EVOS 2.7X44 - [id number] Screw SCREW CORT VA EVOS 2.7X44  [employer]  Right 1 Implanted  SCREW CORT 2.7X38 T8 ST EVOS - [id number] Screw SCREW CORT 2.7X38 T8 ST EVOS  [employer]  Right 1 Implanted  SCREW CORT VA EVOS 2.7X36 - [id number] Screw SCREW CORT VA EVOS 2.7X36  [employer]  Right 1 Implanted  3.5 X 30 Cortex Screw    [employer]  Right 1 Implanted  SCREW CORT EVOS ST 3.5X28 - [id number] Screw SCREW CORT EVOS ST 3.5X28  [employer]  Right 1 Implanted  SCREW CORT 2.7X42 ST VA EVOS - [id number] Screw SCREW CORT 2.7X42 ST VA EVOS   [employer]  Right 1 Implanted  PLATE HUM EVOS 6H R 2.7X85 - [id number] Plate PLATE HUM EVOS 6H R 2.7X85  [employer]  Right 1 Implanted  SCREW CORT 3.5X22 ST EVOS - [id number] Screw SCREW CORT 3.5X22 ST EVOS  [employer]  Right 1 Implanted  SCREW CORT 3.5X20 ST EVOS - [id number] Screw SCREW CORT 3.5X20 ST EVOS  [employer]  Right 2 Implanted  2.7 X 22 Cortex Screw EVOS    [employer]  Right 3 Implanted    DRAINS: None  CULTURES: None  COMPLICATIONS: none  DESCRIPTION OF PROCEDURE:   Patient presented to the preoperative holding area.  The correct site was marked according universal protocol.  Antibiotics were given 1 hour prior to skin incision.  He is subsequently taken back to the operating room.  Anesthesia was induced.  He was prepped and draped in the lateral decubitus position.  Care was taken to pad all bony prominences including the peroneal nerve.   At this time final timeout was performed.  Midline incision was used directly over the distal humerus posteriorly.  At this time Metzenbaums and electrocautery were used to dissect laterally.  A lateral window was established in between the triceps and the lateral intermuscular septum.  The fracture was identified and provisionally reduced.  At this time a medial window was identified.  Metzenbaums were used  to dissect layer by layer.  The ulnar nerve was carefully dissected out with Metzenbaum scissors both proximally and distally.  This was mobilized away from the fracture site.  There was no entrapment of the nerve.  At this time the fracture was reduced medially and provisional K wires were then placed in order to hold the provisional fracture reduction.  Plates were applied medially.  The proximal buttress screw was first applied and then distally 3 screws under fluoroscopic visualization in order to get these as long as possible.  1 nonlocking screw was  used distally.  The remaining 2 proximal screws were placed.   Attention was then returned to the lateral side.  At this time the reduction was maintained with a K wire.  A posterior lateral plate was utilized.  Again the buttress screw was placed followed by 3 additional locking distal capitellar screws.  These all had a good trajectory into the capitellum.  Care was taken to tap on the far cortex to ensure there is no perforation into the capitellum.  The remaining 2 proximal nonlocking screws were placed. A separate 2.7 position screw was used in the cortical shell of bone that was attached to the LCL. Provisional K wires were removed.  There was a longitudinal spilt in the midline triceps that was closed with 0 vicryl in a running fashion. At this time the ulnar nerve was freely mobile and mobilized back into the groove.  Soft tissue was closed over this loosely.  At this time the wound was thoroughly irrigated.  Vancomycin powder was placed.  The wound was closed in layers of 2-0 and staples.  A dressing was applied with Xeroform Webril gauze and an Ace wrap.  All counts were correct at the end of the case.  There are no complications.      POSTOPERATIVE PLAN: He will be non weight bearing on the right arm. He will be seen by PT postop. He will be placed on 2 weeks of aspirin for DVT prophylaxis.  Benancio Deeds, MD 10:22 AM

## 2023-03-07 NOTE — Anesthesia Preprocedure Evaluation (Signed)
Anesthesia Evaluation  Patient identified by MRN, date of birth, ID band Patient awake    Reviewed: Allergy & Precautions, NPO status , Patient's Chart, lab work & pertinent test results  History of Anesthesia Complications Negative for: history of anesthetic complications  Airway Mallampati: II  TM Distance: >3 FB Neck ROM: Full    Dental  (+) Poor Dentition, Chipped   Pulmonary neg sleep apnea, neg COPD, Current SmokerPatient did not abstain from smoking.   Pulmonary exam normal breath sounds clear to auscultation       Cardiovascular Exercise Tolerance: Good METShypertension, Pt. on medications (-) CAD and (-) Past MI (-) dysrhythmias  Rhythm:Regular Rate:Normal - Systolic murmurs    Neuro/Psych  Headaches PSYCHIATRIC DISORDERS  Depression       GI/Hepatic ,neg GERD  ,,(+)     (-) substance abuse    Endo/Other  diabetes  S/p pancreas transplant  Renal/GU Renal diseaseS/p kidney transplant     Musculoskeletal   Abdominal   Peds  Hematology   Anesthesia Other Findings Past Medical History: No date: BK viremia 03/04/2023: Closed fracture of right elbow No date: Depression 11/24/2015: Diabetic peripheral neuropathy (HCC) No date: End stage renal disease (HCC) No date: Erectile dysfunction 04/02/2013: History of simultaneous kidney and pancreas transplant  (HCC) No date: Hypercholesterolemia No date: Hypertension No date: Metabolic acidosis No date: Migraines No date: Recurrent peritonitis (HCC) No date: Type 1 diabetes with renal manifestations, controlled (HCC) No date: Vitamin D deficiency  Reproductive/Obstetrics                             Anesthesia Physical Anesthesia Plan  ASA: 3  Anesthesia Plan: General   Post-op Pain Management: Tylenol PO (pre-op)*, Gabapentin PO (pre-op)* and Regional block*   Induction: Intravenous  PONV Risk Score and Plan: 1 and  Ondansetron, Dexamethasone and Midazolam  Airway Management Planned: Oral ETT and Video Laryngoscope Planned  Additional Equipment: None  Intra-op Plan:   Post-operative Plan: Extubation in OR  Informed Consent: I have reviewed the patients History and Physical, chart, labs and discussed the procedure including the risks, benefits and alternatives for the proposed anesthesia with the patient or authorized representative who has indicated his/her understanding and acceptance.     Dental advisory given  Plan Discussed with: CRNA and Surgeon  Anesthesia Plan Comments: (Discussed risks of anesthesia with patient, including PONV, sore throat, lip/dental/eye damage. Rare risks discussed as well, such as cardiorespiratory and neurological sequelae, and allergic reactions. Discussed the role of CRNA in patient's perioperative care. Patient understands. Discussed r/b/a of supraclavicular nerve block, including:  - bleeding, infection, nerve damage - pneumothorax - shortness of breath from hemidiaphragmatic paralysis due to phrenic nerve blockade - poor or non functioning block. - reactions and toxicity to local anesthetic Patient understands. )       Anesthesia Quick Evaluation

## 2023-03-07 NOTE — Discharge Instructions (Addendum)
Discharge Instructions    Attending Surgeon: Huel Cote, MD Office Phone Number: [phone]   Diagnosis and Procedures:    Surgeries Performed: Right humeral open reduction internal fixation  Discharge Plan:    Diet: Resume usual diet. Begin with light or bland foods.  Drink plenty of fluids.  Activity:  Non weight bearing right arm. You are advised to go home directly from the hospital or surgical center. Restrict your activities.  GENERAL INSTRUCTIONS: 1.  Please apply ice to your wound to help with swelling and inflammation. This will improve your comfort and your overall recovery following surgery.     2. Please call Dr. Serena Croissant office at [phone] with questions Monday-Friday during business hours. If no one answers, please leave a message and someone should get back to the patient within 24 hours. For emergencies please call 911 or proceed to the emergency room.   3. Patient to notify surgical team if experiences any of the following: Bowel/Bladder dysfunction, uncontrolled pain, nerve/muscle weakness, incision with increased drainage or redness, nausea/vomiting and Fever greater than 101.0 F.  Be alert for signs of infection including redness, streaking, odor, fever or chills. Be alert for excessive pain or bleeding and notify your surgeon immediately.  WOUND INSTRUCTIONS:   Leave your dressing, cast, or splint in place until your post operative visit.  Keep it clean and dry.  Always keep the incision clean and dry until the staples/sutures are removed. If there is no drainage from the incision you should keep it open to air. If there is drainage from the incision you must keep it covered at all times until the drainage stops  Do not soak in a bath tub, hot tub, pool, lake or other body of water until 21 days after your surgery and your incision is completely dry and healed.  If you have removable sutures (or staples) they must be removed 10-14 days  (unless otherwise instructed) from the day of your surgery.     1)  Elevate the extremity as much as possible.  2)  Keep the dressing clean and dry.  3)  Please call us if the dressing becomes wet or dirty.  4)  If you are experiencing worsening pain or worsening swelling, please call.     MEDICATIONS: Resume all previous home medications at the previous prescribed dose and frequency unless otherwise noted Start taking the  pain medications on an as-needed basis as prescribed  Please taper down pain medication over the next week following surgery.  Ideally you should not require a refill of any narcotic pain medication.  Take pain medication with food to minimize nausea. In addition to the prescribed pain medication, you may take over-the-counter pain relievers such as Tylenol.  Do NOT take additional tylenol if your pain medication already has tylenol in it.  Aspirin 325mg  daily for four weeks.      FOLLOWUP INSTRUCTIONS: 1. Follow up at the Physical Therapy Clinic 3-4 days following surgery. This appointment should be scheduled unless other arrangements have been made.The Physical Therapy scheduling number is [phone] if an appointment has not already been arranged.  2. Contact Dr. Serena Croissant office during office hours at [phone] or the practice after hours line at [phone] for non-emergencies. For medical emergencies call 911.   AMBULATORY SURGERY  DISCHARGE INSTRUCTIONS   The drugs that you were given will stay in your system until tomorrow so for the next 24 hours you should not:  Drive an automobile Make  any legal decisions Drink any alcoholic beverage   You may resume regular meals tomorrow.  Today it is better to start with liquids and gradually work up to solid foods.  You may eat anything you prefer, but it is better to start with liquids, then soup and crackers, and gradually work up to solid foods.   Please notify your doctor immediately if you have  any unusual bleeding, trouble breathing, redness and pain at the surgery site, drainage, fever, or pain not relieved by medication.    Additional Instructions:        Please contact your physician with any problems or Same Day Surgery at [phone], Monday through Friday 6 am to 4 pm, or [place] at [facility] number at [phone].   Discharge Location: Home  March 07, 2023  Patient: Mark Miranda  Date of Birth:   Date of Visit: 03/07/23    To Whom It May Concern:  Mark Miranda, her husband Mark Miranda was seen and treated in our surger, Same Day Department. 03/07/23. Mark Miranda had surgery may return to work on 03/09/23 .  Sincerely,  Brain Hilts, Rn

## 2023-03-07 NOTE — Interval H&P Note (Signed)
History and Physical Interval Note:  03/07/2023 6:53 AM  Mark Miranda  has presented today for surgery, with the diagnosis of right distal humerus fracture.  The various methods of treatment have been discussed with the patient and family. After consideration of risks, benefits and other options for treatment, the patient has consented to  Procedure(s): OPEN REDUCTION INTERNAL FIXATION (ORIF) DISTAL HUMERUS FRACTURE (Right) ULNAR NERVE EXPLORATION (Right) as a surgical intervention.  The patient's history has been reviewed, patient examined, no change in status, stable for surgery.  I have reviewed the patient's chart and labs.  Questions were answered to the patient's satisfaction.     Huel Cote

## 2023-03-07 NOTE — Brief Op Note (Signed)
   Brief Op Note  Date of Surgery: 03/07/2023  Preoperative Diagnosis: right distal humerus fracture  Postoperative Diagnosis: same  Procedure: Procedure(s): OPEN REDUCTION INTERNAL FIXATION (ORIF) DISTAL HUMERUS FRACTURE ULNAR NERVE EXPLORATION TRICEPS TENDON REPAIR  Implants: Implant Name Type Inv. Item Serial No. Manufacturer Lot No. LRB No. Used Action  PLATE HUM EVOS 1.1B14 3H - [id number] Plate PLATE HUM EVOS 8.6V78 3H  [employer]  Right 1 Implanted  SCREW CORT VA EVOS 2.7X26 - [id number] Screw SCREW CORT VA EVOS 2.7X26  [employer]  Right 1 Explanted  SCREW CORT VA EVOS 2.7X50 - [id number] Screw SCREW CORT VA EVOS 2.7X50  [employer]  Right 1 Implanted  2.7 X 18 Cortex Scre    [employer]  Right 1 Implanted  SCREW CORT VA EVOS 2.7X44 - [id number] Screw SCREW CORT VA EVOS 2.7X44  [employer]  Right 1 Implanted  SCREW CORT 2.7X38 T8 ST EVOS - [id number] Screw SCREW CORT 2.7X38 T8 ST EVOS  [employer]  Right 1 Implanted  SCREW CORT VA EVOS 2.7X36 - [id number] Screw SCREW CORT VA EVOS 2.7X36  [employer]  Right 1 Implanted  3.5 X 30 Cortex Screw    [employer]  Right 1 Implanted  SCREW CORT EVOS ST 3.5X28 - [id number] Screw SCREW CORT EVOS ST 3.5X28  [employer]  Right 1 Implanted  SCREW CORT 2.7X42 ST VA EVOS - [id number] Screw SCREW CORT 2.7X42 ST VA EVOS  [employer]  Right 1 Implanted  PLATE HUM EVOS 6H R 2.7X85 - [id number] Plate PLATE HUM EVOS 6H R 2.7X85  [employer]  Right 1 Implanted  SCREW CORT 3.5X22 ST EVOS - [id number] Screw SCREW CORT 3.5X22 ST EVOS  [employer]  Right 1 Implanted  SCREW CORT 3.5X20 ST EVOS - [id number] Screw SCREW CORT 3.5X20 ST EVOS  [employer]  Right 2 Implanted  2.7 X 22 Cortex Screw EVOS    [employer]  Right 3  Implanted    Surgeons: Surgeon(s): Huel Cote, MD  Anesthesia: General    Estimated Blood Loss: See anesthesia record  Complications: None  Condition to PACU: Stable  Benancio Deeds, MD 03/07/2023 10:21 AM

## 2023-03-08 ENCOUNTER — Other Ambulatory Visit (HOSPITAL_BASED_OUTPATIENT_CLINIC_OR_DEPARTMENT_OTHER): Payer: Self-pay | Admitting: Orthopaedic Surgery

## 2023-03-08 ENCOUNTER — Encounter: Payer: Self-pay | Admitting: Orthopaedic Surgery

## 2023-03-08 ENCOUNTER — Encounter (HOSPITAL_BASED_OUTPATIENT_CLINIC_OR_DEPARTMENT_OTHER): Payer: Self-pay | Admitting: Orthopaedic Surgery

## 2023-03-08 ENCOUNTER — Other Ambulatory Visit: Payer: Self-pay | Admitting: Family Medicine

## 2023-03-08 DIAGNOSIS — E1122 Type 2 diabetes mellitus with diabetic chronic kidney disease: Secondary | ICD-10-CM

## 2023-03-08 MED ORDER — HYDROCODONE-ACETAMINOPHEN 5-325 MG PO TABS: 1.0000 | ORAL_TABLET | Freq: Four times a day (QID) | ORAL | 0 refills | Status: DC | PRN

## 2023-03-09 ENCOUNTER — Other Ambulatory Visit (HOSPITAL_BASED_OUTPATIENT_CLINIC_OR_DEPARTMENT_OTHER): Payer: Self-pay | Admitting: Orthopaedic Surgery

## 2023-03-09 MED ORDER — HYDROCODONE-ACETAMINOPHEN 5-325 MG PO TABS: 1.0000 | ORAL_TABLET | Freq: Four times a day (QID) | ORAL | 0 refills | Status: DC | PRN

## 2023-03-09 NOTE — Anesthesia Postprocedure Evaluation (Signed)
Anesthesia Post Note  Patient: Mark Miranda  Procedure(s) Performed: OPEN REDUCTION INTERNAL FIXATION (ORIF) DISTAL HUMERUS FRACTURE (Right) ULNAR NERVE EXPLORATION (Right) TRICEPS TENDON REPAIR  Patient location during evaluation: PACU Anesthesia Type: General Level of consciousness: awake and alert Pain management: pain level controlled Vital Signs Assessment: post-procedure vital signs reviewed and stable Respiratory status: spontaneous breathing, nonlabored ventilation, respiratory function stable and patient connected to nasal cannula oxygen Cardiovascular status: blood pressure returned to baseline and stable Postop Assessment: no apparent nausea or vomiting Anesthetic complications: no   No notable events documented.   Last Vitals:  Vitals:   03/07/23 1200 03/07/23 1230  BP: 124/66 131/76  Pulse: 84 94  Resp: 15 18  Temp:  36.6 C  SpO2: 95% 94%    Last Pain:  Vitals:   03/07/23 1230  TempSrc: Oral  PainSc: 0-No pain                 Yevette Edwards

## 2023-03-09 NOTE — Therapy (Signed)
OUTPATIENT PHYSICAL THERAPY EVALUATION   Patient Name: Mark Miranda MRN: [id number] DOB:09-17-1970, 52 y.o., male Today's Date: 03/10/2023  END OF SESSION:  PT End of Session - 03/10/23 0841     Visit Number 1    Number of Visits 25    Date for PT Re-Evaluation 06/03/23    Authorization Type Workers Comp, Managed MCD    PT Start Time 0800    PT Stop Time 0840    PT Time Calculation (min) 40 min    Activity Tolerance Patient tolerated treatment well    Behavior During Therapy WFL for tasks assessed/performed             Past Medical History:  Diagnosis Date   BK viremia    Closed fracture of right elbow 03/04/2023   Depression    Diabetic peripheral neuropathy (HCC) 11/24/2015   End stage renal disease (HCC)    Erectile dysfunction    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   Hypercholesterolemia    Hypertension    Metabolic acidosis    Migraines    Recurrent peritonitis (HCC)    Type 1 diabetes with renal manifestations, controlled (HCC)    Vitamin D deficiency    Past Surgical History:  Procedure Laterality Date   AV FISTULA PLACEMENT  01/10/2012   Procedure: ARTERIOVENOUS (AV) FISTULA CREATION;  Surgeon: Sherren Kerns, MD;  Location: [facility] OR;  Service: Vascular;  Laterality: Left;  Left Brachial Cephalic Arteriovenous Fistula   CAPD INSERTION  01/09/2012   Procedure: CONTINUOUS AMBULATORY PERITONEAL DIALYSIS  (CAPD) CATHETER INSERTION;  Surgeon: Ernestene Mention, MD;  Location: [facility] OR;  Service: General;  Laterality: N/A;  Exteriorization PD Cath   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   received left kidney, 3 renal arteries, 1 renal vein, ureter and pancreas from 52 year old   INSERTION OF DIALYSIS CATHETER  01/13/2012   Procedure: INSERTION OF DIALYSIS CATHETER;  Surgeon: Larina Earthly, MD;  Location: [facility] OR;  Service: Vascular;  Laterality: Right;  Internal Jugular   LAPAROSCOPY  03/13/2012   Procedure:  LAPAROSCOPY DIAGNOSTIC;  Surgeon: Ardeth Sportsman, MD;  Location: [facility] OR;  Service: General;  Laterality: N/A;   LIVER BIOPSY  04/02/2013   open   ORIF HUMERUS FRACTURE Right 03/07/2023   Procedure: OPEN REDUCTION INTERNAL FIXATION (ORIF) DISTAL HUMERUS FRACTURE;  Surgeon: Huel Cote, MD;  Location: [facility] ORS;  Service: Orthopedics;  Laterality: Right;   PERITONEAL CATHETER INSERTION     PERITONEAL CATHETER REMOVAL     SMALL INTESTINE SURGERY  04/02/2013   TRICEPS TENDON REPAIR  03/07/2023   Procedure: TRICEPS TENDON REPAIR;  Surgeon: Huel Cote, MD;  Location: [facility] ORS;  Service: Orthopedics;;   ULNAR NERVE TRANSPOSITION Right 03/07/2023   Procedure: ULNAR NERVE EXPLORATION;  Surgeon: Huel Cote, MD;  Location: [facility] ORS;  Service: Orthopedics;  Laterality: Right;   Patient Active Problem List   Diagnosis Date Noted   Closed fracture of right distal humerus 03/07/2023   Closed bicondylar fracture of distal end of right humerus 03/04/2023   Type 2 diabetes mellitus with diabetic chronic kidney disease (HCC) 07/22/2022   Depression, major, 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/2022   Erectile dysfunction 07/24/2017   Vitamin D deficiency, unspecified 07/24/2017   Low back pain 02/25/2016   Chronic fatigue 01/25/2016   Diabetic peripheral neuropathy (HCC) 11/24/2015   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   Depression 11/02/2015   Tobacco abuse 11/02/2015  End stage renal disease (HCC) 12/29/2011   HTN (hypertension) 01/25/2011    REFERRING PROVIDER: Steward Drone, MD  REFERRING DIAG: S42.491A (ICD-10-CM) - Other closed displaced fracture of distal end of right humerus, initial encounter  Post op distal humeral ORIF, ulnar nerve neuroplasty, triceps tendon repair Rationale for Evaluation and Treatment: Rehabilitation  THERAPY DIAG:  Pain in right elbow  Stiffness of right elbow, not elsewhere classified  Muscle weakness  (generalized)  Localized edema  ONSET DATE: DOS 03/07/23   SUBJECTIVE:                                                                                                                                                                                           SUBJECTIVE STATEMENT: Workers comp. Fell from ladder onto elbow.   PERTINENT HISTORY:  Vit D deficiency, h/o ESRD & kidney, pancreas transplant  PAIN:  Are you having pain? Yes: NPRS scale: 3/10 Pain location: Rt elbow Pain description: ache Aggravating factors: movement Relieving factors: pain meds  PRECAUTIONS:  None  RED FLAGS: None   WEIGHT BEARING RESTRICTIONS:  Yes NWB  FALLS:  Has patient fallen in last 6 months? Yes. 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  PATIENT GOALS:  Painting- steady hand, back to normal   OBJECTIVE:   PATIENT SURVEYS:  UEFS eval: 4/80  COGNITIVE STATUS: Within functional limits for tasks assessed   SENSATION: WFL  POSTURE:  rounded shoulders  HAND DOMINANCE:  Right    Body Part #1 Shoulder  PALPATION: Eval: significant edema in elbow and hand, soft end feels  UE ROM UPPER EXTREMITY ROM:  Active ROM Right eval   Shoulder flexion 120   Shoulder extension    Shoulder abduction    Shoulder adduction    Shoulder extension    Shoulder internal rotation    Shoulder external rotation    Elbow flexion 80   Elbow extension -30   Wrist flexion    Wrist extension    Wrist ulnar deviation    Wrist radial deviation    Wrist pronation    Wrist supination     (Blank rows = not tested)     TREATMENT:  DATE: 03/10/23 EVAL Changed bandages- significant laceration in distal forearm also covered, no s/s of infection Passive elbow flexion/ext; edema mob through hand    PATIENT EDUCATION:   Education details: Anatomy of condition, POC, HEP, exercise form/rationale Person educated: Patient Education method: Explanation, Demonstration, Tactile cues, Verbal cues, and Handouts Education comprehension: verbalized understanding, returned demonstration, verbal cues required, tactile cues required, and needs further education  HOME EXERCISE PROGRAM: [id number]  Ice 3-5/day 10-15 min with hand elevated Keep shoulder moving, upright posture   ASSESSMENT:  CLINICAL IMPRESSION: Patient is a 52 y.o. M who was seen today for physical therapy evaluation and treatment for post op procedure humeral ORIF and triceps tendon repair.    OBJECTIVE IMPAIRMENTS: decreased activity tolerance, decreased ROM, decreased strength, increased edema, impaired flexibility, impaired UE functional use, improper body mechanics, postural dysfunction, and pain.   ACTIVITY LIMITATIONS: carrying, lifting, sleeping, bathing, dressing, and reach over head  PARTICIPATION LIMITATIONS: meal prep, cleaning, laundry, shopping, community activity, occupation, and yard work  PERSONAL FACTORS:  see PMH  are also affecting patient's functional outcome.   REHAB POTENTIAL: Good  CLINICAL DECISION MAKING: Stable/uncomplicated  EVALUATION COMPLEXITY: Low   GOALS: Goals reviewed with patient? Yes  SHORT TERM GOALS: Target date: 8/24  Elbow extension to  neutral Baseline: see obj Goal status: INITIAL  2.  Able to make full fist Baseline: unable due to edema at eval Goal status: INITIAL    LONG TERM GOALS: Target date: POC date  Strength 80% of opp UE via hand held dynamometry testing Baseline: not appropriate to test at eval Goal status: INITIAL  2.  Necessary ROM avail for all ADLs Baseline: very limited at eval Goal status: INITIAL  3.  UEFI to increase to at least 70/80 Baseline: see obj Goal status: INITIAL  4.  Able to perform long arc UE motions with weight without incr pain Baseline: will  be necessary for painting.  Goal status: INITIAL     PLAN:  PT FREQUENCY: 1-2x/week  PT DURATION: 12 weeks  PLANNED INTERVENTIONS: Therapeutic exercises, Therapeutic activity, Neuromuscular re-education, Patient/Family education, Self Care, Joint mobilization, Aquatic Therapy, Dry Needling, Electrical stimulation, Spinal mobilization, Cryotherapy, Moist heat, scar mobilization, Taping, Vasopneumatic device, Ultrasound, Ionotophoresis 4mg /ml Dexamethasone, Manual therapy, and Re-evaluation.  PLAN FOR NEXT SESSION: continue ROM, proximal strength/stability. Wrist/hand motion   Check all possible CPT codes: 25366 - PT Re-evaluation, 97110- Therapeutic Exercise, [id number]- Neuro Re-education, 97140 - Manual Therapy, 97530 - Therapeutic Activities, 97535 - Self Care, [id number] - Electrical stimulation (unattended), [id number] - Iontophoresis, [id number] - Ultrasound, 97016 - Vaso, [id number] - Physical performance training, and [phone] - Aquatic therapy    Check all conditions that are expected to impact treatment: {Conditions expected to impact treatment:None of these apply   If treatment provided at initial evaluation, no treatment charged due to lack of authorization.      C.  PT, DPT 03/10/23 11:26 AM

## 2023-03-10 ENCOUNTER — Encounter (HOSPITAL_BASED_OUTPATIENT_CLINIC_OR_DEPARTMENT_OTHER): Payer: Self-pay | Admitting: Physical Therapy

## 2023-03-10 ENCOUNTER — Ambulatory Visit (HOSPITAL_BASED_OUTPATIENT_CLINIC_OR_DEPARTMENT_OTHER): Payer: Worker's Compensation | Attending: Orthopaedic Surgery | Admitting: Physical Therapy

## 2023-03-10 ENCOUNTER — Other Ambulatory Visit: Payer: Self-pay

## 2023-03-10 DIAGNOSIS — R600 Localized edema: Secondary | ICD-10-CM | POA: Diagnosis present

## 2023-03-10 DIAGNOSIS — M25521 Pain in right elbow: Secondary | ICD-10-CM

## 2023-03-10 DIAGNOSIS — S42491A Other displaced fracture of lower end of right humerus, initial encounter for closed fracture: Secondary | ICD-10-CM | POA: Insufficient documentation

## 2023-03-10 DIAGNOSIS — M6281 Muscle weakness (generalized): Secondary | ICD-10-CM | POA: Diagnosis present

## 2023-03-10 DIAGNOSIS — M25621 Stiffness of right elbow, not elsewhere classified: Secondary | ICD-10-CM | POA: Diagnosis present

## 2023-03-11 ENCOUNTER — Other Ambulatory Visit: Payer: Self-pay | Admitting: Family Medicine

## 2023-03-13 ENCOUNTER — Encounter (HOSPITAL_BASED_OUTPATIENT_CLINIC_OR_DEPARTMENT_OTHER): Payer: Self-pay | Admitting: Physical Therapy

## 2023-03-13 ENCOUNTER — Other Ambulatory Visit: Payer: Self-pay

## 2023-03-13 ENCOUNTER — Ambulatory Visit (HOSPITAL_BASED_OUTPATIENT_CLINIC_OR_DEPARTMENT_OTHER): Payer: Worker's Compensation | Admitting: Physical Therapy

## 2023-03-13 DIAGNOSIS — M25521 Pain in right elbow: Secondary | ICD-10-CM

## 2023-03-13 DIAGNOSIS — M25621 Stiffness of right elbow, not elsewhere classified: Secondary | ICD-10-CM

## 2023-03-13 DIAGNOSIS — M6281 Muscle weakness (generalized): Secondary | ICD-10-CM

## 2023-03-13 DIAGNOSIS — R600 Localized edema: Secondary | ICD-10-CM

## 2023-03-13 MED ORDER — LISINOPRIL 10 MG PO TABS
10.0000 mg | ORAL_TABLET | Freq: Every day | ORAL | 0 refills | Status: DC
  Filled 2023-03-13: qty 90, 90d supply, fill #0

## 2023-03-13 NOTE — Therapy (Signed)
OUTPATIENT PHYSICAL THERAPY TREATMENT   Patient Name: Mark Miranda MRN: [id number] DOB:1970/09/29, 52 y.o., male Today's Date: 03/13/2023  END OF SESSION:  PT End of Session - 03/13/23 1427     Visit Number 2    Number of Visits 25    Date for PT Re-Evaluation 06/03/23    Authorization Type Workers Comp, Managed MCD    PT Start Time 1425    PT Stop Time 1458    PT Time Calculation (min) 33 min    Activity Tolerance Patient tolerated treatment well    Behavior During Therapy WFL for tasks assessed/performed             Past Medical History:  Diagnosis Date   BK viremia    Closed fracture of right elbow 03/04/2023   Depression    Diabetic peripheral neuropathy (HCC) 11/24/2015   End stage renal disease (HCC)    Erectile dysfunction    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   Hypercholesterolemia    Hypertension    Metabolic acidosis    Migraines    Recurrent peritonitis (HCC)    Type 1 diabetes with renal manifestations, controlled (HCC)    Vitamin D deficiency    Past Surgical History:  Procedure Laterality Date   AV FISTULA PLACEMENT  01/10/2012   Procedure: ARTERIOVENOUS (AV) FISTULA CREATION;  Surgeon: Sherren Kerns, MD;  Location: [facility] OR;  Service: Vascular;  Laterality: Left;  Left Brachial Cephalic Arteriovenous Fistula   CAPD INSERTION  01/09/2012   Procedure: CONTINUOUS AMBULATORY PERITONEAL DIALYSIS  (CAPD) CATHETER INSERTION;  Surgeon: Ernestene Mention, MD;  Location: [facility] OR;  Service: General;  Laterality: N/A;  Exteriorization PD Cath   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 OR;  Service: General;  Laterality: N/A;   LIVER BIOPSY  04/02/2013   open   ORIF HUMERUS FRACTURE Right 03/07/2023   Procedure: OPEN REDUCTION INTERNAL FIXATION (ORIF) DISTAL HUMERUS FRACTURE;  Surgeon: Huel Cote, MD;  Location: [facility] ORS;  Service: Orthopedics;  Laterality: Right;   PERITONEAL CATHETER INSERTION     PERITONEAL CATHETER REMOVAL     SMALL INTESTINE SURGERY  04/02/2013   TRICEPS TENDON REPAIR  03/07/2023   Procedure: TRICEPS TENDON REPAIR;  Surgeon: Huel Cote, MD;  Location: [facility] ORS;  Service: Orthopedics;;   ULNAR NERVE TRANSPOSITION Right 03/07/2023   Procedure: ULNAR NERVE EXPLORATION;  Surgeon: Huel Cote, MD;  Location: [facility] ORS;  Service: Orthopedics;  Laterality: Right;   Patient Active Problem List   Diagnosis Date Noted   Closed fracture of right distal humerus 03/07/2023   Closed bicondylar fracture of distal end of right humerus 03/04/2023   Type 2 diabetes mellitus with diabetic chronic kidney disease (HCC) 07/22/2022   Depression, major, 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/2022   Erectile dysfunction 07/24/2017   Vitamin D deficiency, unspecified 07/24/2017   Low back pain 02/25/2016   Chronic fatigue 01/25/2016   Diabetic peripheral neuropathy (HCC) 11/24/2015   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   Depression 11/02/2015   Tobacco abuse 11/02/2015  End stage renal disease (HCC) 12/29/2011   HTN (hypertension) 01/25/2011    REFERRING PROVIDER: Steward Drone, MD  REFERRING DIAG: S42.491A (ICD-10-CM) - Other closed displaced fracture of distal end of right humerus, initial encounter  Post op distal humeral ORIF, ulnar nerve neuroplasty, triceps tendon repair Rationale for Evaluation and Treatment: Rehabilitation  THERAPY DIAG:  Pain in right elbow  Stiffness of right elbow, not elsewhere classified  Muscle weakness  (generalized)  Localized edema  ONSET DATE: DOS 03/07/23   SUBJECTIVE:                                                                                                                                                                                           SUBJECTIVE STATEMENT: Its doing ok.   PERTINENT HISTORY:  Vit D deficiency, h/o ESRD & kidney, pancreas transplant  PAIN:  Are you having pain? Yes: NPRS scale: 3/10 Pain location: Rt elbow Pain description: ache Aggravating factors: movement Relieving factors: pain meds  PRECAUTIONS:  None  RED FLAGS: None   WEIGHT BEARING RESTRICTIONS:  Yes NWB  FALLS:  Has patient fallen in last 6 months? Yes. 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  PATIENT GOALS:  Painting- steady hand, back to normal   OBJECTIVE:   PATIENT SURVEYS:  UEFS eval: 4/80  COGNITIVE STATUS: Within functional limits for tasks assessed   SENSATION: WFL  POSTURE:  rounded shoulders  HAND DOMINANCE:  Right    Body Part #1 Shoulder  PALPATION: Eval: significant edema in elbow and hand, soft end feels  UE ROM UPPER EXTREMITY ROM:  Active ROM Right eval   Shoulder flexion 120   Shoulder extension    Shoulder abduction    Shoulder adduction    Shoulder extension    Shoulder internal rotation    Shoulder external rotation    Elbow flexion 80 90  Elbow extension -30 -30  Wrist flexion    Wrist extension    Wrist ulnar deviation    Wrist radial deviation    Wrist pronation    Wrist supination     (Blank rows = not tested)     TREATMENT:  Treatment                            8/5:   Changed bandages- some yellow and green discharge from distal laceration that was a result of fall  Edema mobilization PROM elbow/wrist/hand also with gentle  distraction    DATE: 03/10/23 EVAL Changed bandages- significant laceration in distal forearm also covered, no s/s of infection Passive elbow flexion/ext; edema mob through hand    PATIENT EDUCATION:  Education details: Anatomy of condition, POC, HEP, exercise form/rationale Person educated: Patient Education method: Programmer, multimedia, Demonstration, Tactile cues, Verbal cues, and Handouts Education comprehension: verbalized understanding, returned demonstration, verbal cues required, tactile cues required, and needs further education  HOME EXERCISE PROGRAM: [id number]  Ice 3-5/day 10-15 min with hand elevated Keep shoulder moving, upright posture   ASSESSMENT:  CLINICAL IMPRESSION: Pitting edema in post elbow and hand-able to significantly reduce through hand and soften superficial fascia levels around elbow today. Discomfort but spongy at end feel of ROM.    OBJECTIVE IMPAIRMENTS: decreased activity tolerance, decreased ROM, decreased strength, increased edema, impaired flexibility, impaired UE functional use, improper body mechanics, postural dysfunction, and pain.   ACTIVITY LIMITATIONS: carrying, lifting, sleeping, bathing, dressing, and reach over head  PARTICIPATION LIMITATIONS: meal prep, cleaning, laundry, shopping, community activity, occupation, and yard work  PERSONAL FACTORS:  see PMH  are also affecting patient's functional outcome.   REHAB POTENTIAL: Good  CLINICAL DECISION MAKING: Stable/uncomplicated  EVALUATION COMPLEXITY: Low   GOALS: Goals reviewed with patient? Yes  SHORT TERM GOALS: Target date: 8/24  Elbow extension to  neutral Baseline: see obj Goal status: INITIAL  2.  Able to make full fist Baseline: unable due to edema at eval Goal status: INITIAL    LONG TERM GOALS: Target date: POC date  Strength 80% of opp UE via hand held dynamometry testing Baseline: not appropriate to test at eval Goal status: INITIAL  2.  Necessary ROM avail for  all ADLs Baseline: very limited at eval Goal status: INITIAL  3.  UEFI to increase to at least 70/80 Baseline: see obj Goal status: INITIAL  4.  Able to perform long arc UE motions with weight without incr pain Baseline: will be necessary for painting.  Goal status: INITIAL     PLAN:  PT FREQUENCY: 1-2x/week  PT DURATION: 12 weeks  PLANNED INTERVENTIONS: Therapeutic exercises, Therapeutic activity, Neuromuscular re-education, Patient/Family education, Self Care, Joint mobilization, Aquatic Therapy, Dry Needling, Electrical stimulation, Spinal mobilization, Cryotherapy, Moist heat, scar mobilization, Taping, Vasopneumatic device, Ultrasound, Ionotophoresis 4mg /ml Dexamethasone, Manual therapy, and Re-evaluation.  PLAN FOR NEXT SESSION: continue ROM, proximal strength/stability. Wrist/hand motion   Check all possible CPT codes: 65784 - PT Re-evaluation, 97110- Therapeutic Exercise, [phone]- Neuro Re-education, 97140 - Manual Therapy, 97530 - Therapeutic Activities, 97535 - Self Care, [phone] - Electrical stimulation (unattended), [id number] - Iontophoresis, [id number] - Ultrasound, 97016 - Vaso, [id number] - Physical performance training, and [phone] - Aquatic therapy    Check all conditions that are expected to impact treatment: {Conditions expected to impact treatment:None of these apply   If treatment provided at initial evaluation, no treatment charged due to lack of authorization.      C.  PT, DPT 03/13/23 2:58 PM

## 2023-03-14 ENCOUNTER — Other Ambulatory Visit: Payer: Self-pay | Admitting: Orthopaedic Surgery

## 2023-03-14 MED ORDER — OXYCODONE HCL 5 MG PO TABS: 5.0000 mg | ORAL_TABLET | ORAL | 0 refills | Status: DC | PRN

## 2023-03-15 ENCOUNTER — Other Ambulatory Visit: Payer: Self-pay

## 2023-03-15 NOTE — Therapy (Addendum)
OUTPATIENT PHYSICAL THERAPY TREATMENT   Patient Name: Mark Miranda MRN: [id number] DOB:May 22, 1971, 52 y.o., male Today's Date: 03/17/2023  END OF SESSION:  PT End of Session - 03/17/23 1543     Visit Number 3    Number of Visits 25    Date for PT Re-Evaluation 06/03/23    Authorization Type Workers Comp, Managed MCD    PT Start Time 1450    PT Stop Time 1535    PT Time Calculation (min) 45 min    Activity Tolerance Patient tolerated treatment well    Behavior During Therapy WFL for tasks assessed/performed              Past Medical History:  Diagnosis Date   BK viremia    Closed fracture of right elbow 03/04/2023   Depression    Diabetic peripheral neuropathy (HCC) 11/24/2015   End stage renal disease (HCC)    Erectile dysfunction    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   Hypercholesterolemia    Hypertension    Metabolic acidosis    Migraines    Recurrent peritonitis (HCC)    Type 1 diabetes with renal manifestations, controlled (HCC)    Vitamin D deficiency    Past Surgical History:  Procedure Laterality Date   AV FISTULA PLACEMENT  01/10/2012   Procedure: ARTERIOVENOUS (AV) FISTULA CREATION;  Surgeon: Sherren Kerns, MD;  Location: [facility] OR;  Service: Vascular;  Laterality: Left;  Left Brachial Cephalic Arteriovenous Fistula   CAPD INSERTION  01/09/2012   Procedure: CONTINUOUS AMBULATORY PERITONEAL DIALYSIS  (CAPD) CATHETER INSERTION;  Surgeon: Ernestene Mention, MD;  Location: [facility] OR;  Service: General;  Laterality: N/A;  Exteriorization PD Cath   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   received left kidney, 3 renal arteries, 1 renal vein, ureter and pancreas from 52 year old   INSERTION OF DIALYSIS CATHETER  01/13/2012   Procedure: INSERTION OF DIALYSIS CATHETER;  Surgeon: Larina Earthly, MD;  Location: [facility] OR;  Service: Vascular;  Laterality: Right;  Internal Jugular   LAPAROSCOPY  03/13/2012   Procedure:  LAPAROSCOPY DIAGNOSTIC;  Surgeon: Ardeth Sportsman, MD;  Location: [facility] OR;  Service: General;  Laterality: N/A;   LIVER BIOPSY  04/02/2013   open   ORIF HUMERUS FRACTURE Right 03/07/2023   Procedure: OPEN REDUCTION INTERNAL FIXATION (ORIF) DISTAL HUMERUS FRACTURE;  Surgeon: Huel Cote, MD;  Location: [facility] ORS;  Service: Orthopedics;  Laterality: Right;   PERITONEAL CATHETER INSERTION     PERITONEAL CATHETER REMOVAL     SMALL INTESTINE SURGERY  04/02/2013   TRICEPS TENDON REPAIR  03/07/2023   Procedure: TRICEPS TENDON REPAIR;  Surgeon: Huel Cote, MD;  Location: [facility] ORS;  Service: Orthopedics;;   ULNAR NERVE TRANSPOSITION Right 03/07/2023   Procedure: ULNAR NERVE EXPLORATION;  Surgeon: Huel Cote, MD;  Location: [facility] ORS;  Service: Orthopedics;  Laterality: Right;   Patient Active Problem List   Diagnosis Date Noted   Closed fracture of right distal humerus 03/07/2023   Closed bicondylar fracture of distal end of right humerus 03/04/2023   Type 2 diabetes mellitus with diabetic chronic kidney disease (HCC) 07/22/2022   Depression, major, 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/2022   Erectile dysfunction 07/24/2017   Vitamin D deficiency, unspecified 07/24/2017   Low back pain 02/25/2016   Chronic fatigue 01/25/2016   Diabetic peripheral neuropathy (HCC) 11/24/2015   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   Depression 11/02/2015   Tobacco abuse 11/02/2015  End stage renal disease (HCC) 12/29/2011   HTN (hypertension) 01/25/2011    REFERRING PROVIDER: Steward Drone, MD  REFERRING DIAG: S42.491A (ICD-10-CM) - Other closed displaced fracture of distal end of right humerus, initial encounter  Post op distal humeral ORIF, ulnar nerve neuroplasty, triceps tendon repair Rationale for Evaluation and Treatment: Rehabilitation  THERAPY DIAG:  Pain in right elbow  Stiffness of right elbow, not elsewhere classified  Muscle weakness  (generalized)  Localized edema  ONSET DATE: DOS 03/07/23   SUBJECTIVE:                                                                                                                                                                                           SUBJECTIVE STATEMENT: Pt is 9 days s/p R distal humeral ORIF, R triceps tendon repair, and R ulnar nerve neuroplasty.  Pt denies any adverse effects after prior Rx.  Pt states his swelling was better after PT worked on it.  Pt states his pain is improving slowly.  Pt states he has been performing his HEP and feels good with wrist AROM.  PERTINENT HISTORY:  Vit D deficiency, h/o ESRD & kidney, pancreas transplant  PAIN:  Are you having pain? Yes: NPRS scale: 1/10 Pain location: Rt elbow Pain description: ache Aggravating factors: movement Relieving factors: pain meds  PRECAUTIONS:  None  RED FLAGS: None   WEIGHT BEARING RESTRICTIONS:  Yes NWB  FALLS:  Has patient fallen in last 6 months? Yes. 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  PATIENT GOALS:  Painting- steady hand, back to normal   OBJECTIVE:   HAND DOMINANCE:  Right     TREATMENT:                                                                                                                               Reviewed pt presentation, pain level, HEP compliance, and response to prior Rx. Pt has swelling in R hand, R forearm, and distal humerus.  He has bruising in R distal humerus, medial elbow,  and forearm.  Pt performed wrist flexion and extension AROM in neutral with UE supported. Pt performed hand pumps in neutral with UE supported. PROM to hand/fingers flex/ext in sitting with UE supported  PT removed tegaderm and gauze and applied new gauze and tegaderm over incision and distal laceration.       PATIENT EDUCATION:  Education details: Anatomy of condition, POC, HEP, exercise form/rationale,  protocol restrictions, and dressings.  PT answered pt's questions concerning the dressings.   Person educated: Patient Education method: Explanation, Demonstration, Tactile cues, Verbal cues, and Handouts Education comprehension: verbalized understanding, returned demonstration, verbal cues required, tactile cues required, and needs further education  HOME EXERCISE PROGRAM: [id number]  Ice 3-5/day 10-15 min with hand elevated Keep shoulder moving, upright posture   ASSESSMENT:  CLINICAL IMPRESSION: Pt continues to have swelling t/o R hand, R forearm, and distal humerus though states it improved after last Rx.  Pt performed wrist AROM without any pain or c/o's and states he has felt fine performing that at home.  PT changed bandages today.  Xeroform gauze was over incision.  PT didn't notice any yellow or green discharge at distal laceration.  Pt tolerated treatment well and reports no pain after Rx stating he feels fine.    OBJECTIVE IMPAIRMENTS: decreased activity tolerance, decreased ROM, decreased strength, increased edema, impaired flexibility, impaired UE functional use, improper body mechanics, postural dysfunction, and pain.   ACTIVITY LIMITATIONS: carrying, lifting, sleeping, bathing, dressing, and reach over head  PARTICIPATION LIMITATIONS: meal prep, cleaning, laundry, shopping, community activity, occupation, and yard work  PERSONAL FACTORS:  see PMH  are also affecting patient's functional outcome.   REHAB POTENTIAL: Good  CLINICAL DECISION MAKING: Stable/uncomplicated  EVALUATION COMPLEXITY: Low   GOALS: Goals reviewed with patient? Yes  SHORT TERM GOALS: Target date: 8/24  Elbow extension to  neutral Baseline: see obj Goal status: INITIAL  2.  Able to make full fist Baseline: unable due to edema at eval Goal status: INITIAL    LONG TERM GOALS: Target date: POC date  Strength 80% of opp UE via hand held dynamometry testing Baseline: not appropriate to  test at eval Goal status: INITIAL  2.  Necessary ROM avail for all ADLs Baseline: very limited at eval Goal status: INITIAL  3.  UEFI to increase to at least 70/80 Baseline: see obj Goal status: INITIAL  4.  Able to perform long arc UE motions with weight without incr pain Baseline: will be necessary for painting.  Goal status: INITIAL     PLAN:  PT FREQUENCY: 1-2x/week  PT DURATION: 12 weeks  PLANNED INTERVENTIONS: Therapeutic exercises, Therapeutic activity, Neuromuscular re-education, Patient/Family education, Self Care, Joint mobilization, Aquatic Therapy, Dry Needling, Electrical stimulation, Spinal mobilization, Cryotherapy, Moist heat, scar mobilization, Taping, Vasopneumatic device, Ultrasound, Ionotophoresis 4mg /ml Dexamethasone, Manual therapy, and Re-evaluation.  PLAN FOR NEXT SESSION:  Cont per protocol. Wrist/hand motion.  Pt sees MD on Monday.    Audie Clear III PT, DPT 03/17/23 4:11 PM

## 2023-03-16 ENCOUNTER — Ambulatory Visit (HOSPITAL_BASED_OUTPATIENT_CLINIC_OR_DEPARTMENT_OTHER): Payer: Worker's Compensation | Attending: Orthopaedic Surgery | Admitting: Physical Therapy

## 2023-03-16 DIAGNOSIS — R600 Localized edema: Secondary | ICD-10-CM | POA: Diagnosis present

## 2023-03-16 DIAGNOSIS — M25621 Stiffness of right elbow, not elsewhere classified: Secondary | ICD-10-CM | POA: Insufficient documentation

## 2023-03-16 DIAGNOSIS — M6281 Muscle weakness (generalized): Secondary | ICD-10-CM | POA: Diagnosis present

## 2023-03-16 DIAGNOSIS — M25521 Pain in right elbow: Secondary | ICD-10-CM | POA: Insufficient documentation

## 2023-03-17 ENCOUNTER — Encounter (HOSPITAL_BASED_OUTPATIENT_CLINIC_OR_DEPARTMENT_OTHER): Payer: Self-pay | Admitting: Physical Therapy

## 2023-03-20 ENCOUNTER — Ambulatory Visit (INDEPENDENT_AMBULATORY_CARE_PROVIDER_SITE_OTHER): Payer: Worker's Compensation

## 2023-03-20 ENCOUNTER — Ambulatory Visit (INDEPENDENT_AMBULATORY_CARE_PROVIDER_SITE_OTHER): Payer: Worker's Compensation | Admitting: Orthopaedic Surgery

## 2023-03-20 ENCOUNTER — Encounter (HOSPITAL_BASED_OUTPATIENT_CLINIC_OR_DEPARTMENT_OTHER): Payer: Self-pay

## 2023-03-20 ENCOUNTER — Ambulatory Visit (HOSPITAL_BASED_OUTPATIENT_CLINIC_OR_DEPARTMENT_OTHER): Payer: Worker's Compensation

## 2023-03-20 ENCOUNTER — Other Ambulatory Visit (HOSPITAL_BASED_OUTPATIENT_CLINIC_OR_DEPARTMENT_OTHER): Payer: Self-pay

## 2023-03-20 DIAGNOSIS — M25521 Pain in right elbow: Secondary | ICD-10-CM

## 2023-03-20 DIAGNOSIS — S42491A Other displaced fracture of lower end of right humerus, initial encounter for closed fracture: Secondary | ICD-10-CM

## 2023-03-20 DIAGNOSIS — M25621 Stiffness of right elbow, not elsewhere classified: Secondary | ICD-10-CM

## 2023-03-20 DIAGNOSIS — M6281 Muscle weakness (generalized): Secondary | ICD-10-CM

## 2023-03-20 DIAGNOSIS — R600 Localized edema: Secondary | ICD-10-CM

## 2023-03-20 MED ORDER — CEPHALEXIN 500 MG PO CAPS
500.0000 mg | ORAL_CAPSULE | Freq: Four times a day (QID) | ORAL | 0 refills | Status: AC
  Filled 2023-03-20: qty 12, 3d supply, fill #0

## 2023-03-20 MED ORDER — OXYCODONE HCL 5 MG PO TABS
5.0000 mg | ORAL_TABLET | ORAL | 0 refills | Status: DC | PRN
  Filled 2023-03-20: qty 20, 4d supply, fill #0

## 2023-03-20 NOTE — Progress Notes (Signed)
Post Operative Evaluation    Procedure/Date of Surgery: Status post right distal humerus open reduction internal fixation 7/30  Interval History:   Presents today 2 weeks status post above procedure.  He has been able to work with physical therapy.  Denies any fevers or chills.  Denies any drainage.  He is continuing to work on range of motion.   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   Hypercholesterolemia    Hypertension    Metabolic acidosis    Migraines    Recurrent peritonitis (HCC)    Type 1 diabetes with renal manifestations, controlled (HCC)    Vitamin D deficiency    Past Surgical History:  Procedure Laterality Date   AV FISTULA PLACEMENT  01/10/2012   Procedure: ARTERIOVENOUS (AV) FISTULA CREATION;  Surgeon: Sherren Kerns, MD;  Location: [facility] OR;  Service: Vascular;  Laterality: Left;  Left Brachial Cephalic Arteriovenous Fistula   CAPD INSERTION  01/09/2012   Procedure: CONTINUOUS AMBULATORY PERITONEAL DIALYSIS  (CAPD) CATHETER INSERTION;  Surgeon: Ernestene Mention, MD;  Location: [facility] OR;  Service: General;  Laterality: N/A;  Exteriorization PD Cath   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   received left kidney, 3 renal arteries, 1 renal vein, ureter and pancreas from 52 year old   INSERTION OF DIALYSIS CATHETER  01/13/2012   Procedure: INSERTION OF DIALYSIS CATHETER;  Surgeon: Larina Earthly, MD;  Location: [facility] OR;  Service: Vascular;  Laterality: Right;  Internal Jugular   LAPAROSCOPY  03/13/2012   Procedure: LAPAROSCOPY DIAGNOSTIC;  Surgeon: Ardeth Sportsman, MD;  Location: [facility] OR;  Service: General;  Laterality:  N/A;   LIVER BIOPSY  04/02/2013   open   ORIF HUMERUS FRACTURE Right 03/07/2023   Procedure: OPEN REDUCTION INTERNAL FIXATION (ORIF) DISTAL HUMERUS FRACTURE;  Surgeon: Huel Cote, MD;  Location: [facility] ORS;  Service: Orthopedics;  Laterality: Right;   PERITONEAL CATHETER INSERTION     PERITONEAL CATHETER REMOVAL     SMALL INTESTINE SURGERY  04/02/2013   TRICEPS TENDON REPAIR  03/07/2023   Procedure: TRICEPS TENDON REPAIR;  Surgeon: Huel Cote, MD;  Location: [facility] ORS;  Service: Orthopedics;;   ULNAR NERVE TRANSPOSITION Right 03/07/2023   Procedure: ULNAR NERVE EXPLORATION;  Surgeon: Huel Cote, MD;  Location: [facility] ORS;  Service: Orthopedics;  Laterality: Right;   Social History   Socioeconomic History   Marital status: Married    Spouse name: Byrd Hesselbach   Number of children: 4   Years of education: 10   Highest education level: Not on file  Occupational History   Occupation: Education administrator  Tobacco Use   Smoking status: Every Day    Current packs/day: 1.00    Average packs/day: 1 pack/day for 22.0 years (22.0 ttl pk-yrs)    Types: Cigarettes   Smokeless tobacco: Never  Vaping Use   Vaping status: Every Day   Substances: Nicotine  Substance and Sexual Activity   Alcohol use: Yes    Alcohol/week: 0.0 standard drinks of alcohol    Comment: socially  Drug use: No   Sexual activity: Yes  Other Topics Concern   Not on file  Social History Narrative   Paints houses   Wife works at a Retail banker at [employer]   4 children age 20 to 5   Regular exercise-no current exercise regimen   Caffeine Use-no   Social Determinants of Health   Financial Resource Strain: Not on file  Food Insecurity: Not on file  Transportation Needs: Not on file  Physical Activity: Not on file  Stress: Not on file  Social Connections: Not on file   Family History  Problem Relation Age of Onset   Diabetes Mother    Hypertension Mother    Hyperlipidemia Mother    Cancer Father        glioblastoma    No Known Allergies Current Outpatient Medications  Medication Sig Dispense Refill   cephALEXin (KEFLEX) 500 MG capsule Take 1 capsule (500 mg total) by mouth 4 (four) times daily for 3 days. 12 capsule 0   oxyCODONE (ROXICODONE) 5 MG immediate release tablet Take 1 tablet (5 mg total) by mouth every 4 (four) hours as needed for severe pain or breakthrough pain. 20 tablet 0   aspirin EC 325 MG tablet Take 1 tablet (325 mg total) by mouth daily. 14 tablet 0   cephALEXin (KEFLEX) 500 MG capsule Take 1 capsule (500 mg total) by mouth 4 (four) times daily. 20 capsule 0   dapagliflozin propanediol (FARXIGA) 10 MG TABS tablet TAKE 1 TABLET BY MOUTH DAILY BEFORE BREAKFAST. 90 tablet 0   HYDROcodone-acetaminophen (NORCO/VICODIN) 5-325 MG tablet Take 1 tablet by mouth every 6 (six) hours as needed for moderate pain. 20 tablet 0   lisinopril (ZESTRIL) 10 MG tablet Take 1 tablet (10 mg total) by mouth daily. 90 tablet 0   mycophenolate (MYFORTIC) 180 MG EC tablet Take 4 tablets by mouth 2 (two) times daily.     oxyCODONE (ROXICODONE) 5 MG immediate release tablet Take 1 tablet (5 mg total) by mouth every 4 (four) hours as needed for severe pain or breakthrough pain. 20 tablet 0   sildenafil (VIAGRA) 100 MG tablet Take 0.5-1 tablets (50-100 mg total) by mouth daily as needed for erectile dysfunction. 15 tablet 6   sitaGLIPtin (JANUVIA) 50 MG tablet Take 50 mg by mouth daily.     tacrolimus (PROGRAF) 1 MG capsule Take 5 mg by mouth 2 (two) times daily. Pt take 5 mg in the morning and 4 mg at night     No current facility-administered medications for this visit.   No results found.  Review of Systems:   A ROS was performed including pertinent positives and negatives as documented in the HPI.   Musculoskeletal Exam:    There were no vitals taken for this visit.  Right incision is well-appearing.  There is some redness around the proximal wound.  Range of motion is from 20 degrees to 120 degrees.   Sensation is intact to light touch throughout.  Fires ulnar interosseous nerve distribution as well as radial nerve with strong wrist extension and 2+ radial pulse  Imaging:    2 views right humerus: Status post open reduction internal fixation without evidence of hardware complication  I personally reviewed and interpreted the radiographs.   Assessment:   2 weeks status post right distal humeral open reduction internal fixation overall doing well.  At this time I would like him to continue to work.  Passive range of motion as tolerated.  I would like  him to refrain from any type of lifting although he should begin to work on range of motion as tolerated  Plan :    -Return to clinic in 4 weeks for reassessment and motion check      I personally saw and evaluated the patient, and participated in the management and treatment plan.  Huel Cote, MD Attending Physician, Orthopedic Surgery  This document was dictated using Dragon voice recognition software. A reasonable attempt at proof reading has been made to minimize errors.

## 2023-03-20 NOTE — Therapy (Signed)
OUTPATIENT PHYSICAL THERAPY TREATMENT   Patient Name: Mark Miranda MRN: [id number] DOB:1970/12/13, 52 y.o., male Today's Date: 03/20/2023  END OF SESSION:  PT End of Session - 03/20/23 0845     Visit Number 4    Number of Visits 25    Date for PT Re-Evaluation 06/03/23    Authorization Type Workers Comp, Managed MCD    PT Start Time [phone]    PT Stop Time 0930    PT Time Calculation (min) 43 min    Activity Tolerance Patient tolerated treatment well    Behavior During Therapy [facility] for tasks assessed/performed               Past Medical History:  Diagnosis Date   BK viremia    Closed fracture of right elbow 03/04/2023   Depression    Diabetic peripheral neuropathy (HCC) 11/24/2015   End stage renal disease (HCC)    Erectile dysfunction    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   Hypercholesterolemia    Hypertension    Metabolic acidosis    Migraines    Recurrent peritonitis (HCC)    Type 1 diabetes with renal manifestations, controlled (HCC)    Vitamin D deficiency    Past Surgical History:  Procedure Laterality Date   AV FISTULA PLACEMENT  01/10/2012   Procedure: ARTERIOVENOUS (AV) FISTULA CREATION;  Surgeon: Sherren Kerns, MD;  Location: [facility] OR;  Service: Vascular;  Laterality: Left;  Left Brachial Cephalic Arteriovenous Fistula   CAPD INSERTION  01/09/2012   Procedure: CONTINUOUS AMBULATORY PERITONEAL DIALYSIS  (CAPD) CATHETER INSERTION;  Surgeon: Ernestene Mention, MD;  Location: [facility] OR;  Service: General;  Laterality: N/A;  Exteriorization PD Cath   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   received left kidney, 3 renal arteries, 1 renal vein, ureter and pancreas from 52 year old   INSERTION OF DIALYSIS CATHETER  01/13/2012   Procedure: INSERTION OF DIALYSIS CATHETER;  Surgeon: Larina Earthly, MD;  Location: [facility] OR;  Service: Vascular;  Laterality: Right;  Internal Jugular   LAPAROSCOPY  03/13/2012   Procedure:  LAPAROSCOPY DIAGNOSTIC;  Surgeon: Ardeth Sportsman, MD;  Location: [facility] OR;  Service: General;  Laterality: N/A;   LIVER BIOPSY  04/02/2013   open   ORIF HUMERUS FRACTURE Right 03/07/2023   Procedure: OPEN REDUCTION INTERNAL FIXATION (ORIF) DISTAL HUMERUS FRACTURE;  Surgeon: Huel Cote, MD;  Location: [facility] ORS;  Service: Orthopedics;  Laterality: Right;   PERITONEAL CATHETER INSERTION     PERITONEAL CATHETER REMOVAL     SMALL INTESTINE SURGERY  04/02/2013   TRICEPS TENDON REPAIR  03/07/2023   Procedure: TRICEPS TENDON REPAIR;  Surgeon: Huel Cote, MD;  Location: [facility] ORS;  Service: Orthopedics;;   ULNAR NERVE TRANSPOSITION Right 03/07/2023   Procedure: ULNAR NERVE EXPLORATION;  Surgeon: Huel Cote, MD;  Location: [facility] ORS;  Service: Orthopedics;  Laterality: Right;   Patient Active Problem List   Diagnosis Date Noted   Closed fracture of right distal humerus 03/07/2023   Closed bicondylar fracture of distal end of right humerus 03/04/2023   Type 2 diabetes mellitus with diabetic chronic kidney disease (HCC) 07/22/2022   Depression, major, 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/2022   Erectile dysfunction 07/24/2017   Vitamin D deficiency, unspecified 07/24/2017   Low back pain 02/25/2016   Chronic fatigue 01/25/2016   Diabetic peripheral neuropathy (HCC) 11/24/2015   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   Depression 11/02/2015   Tobacco abuse 11/02/2015  End stage renal disease (HCC) 12/29/2011   HTN (hypertension) 01/25/2011    REFERRING PROVIDER: Steward Drone, MD  REFERRING DIAG: S42.491A (ICD-10-CM) - Other closed displaced fracture of distal end of right humerus, initial encounter  Post op distal humeral ORIF, ulnar nerve neuroplasty, triceps tendon repair Rationale for Evaluation and Treatment: Rehabilitation  THERAPY DIAG:  Localized edema  Muscle weakness (generalized)  Stiffness of right elbow, not elsewhere classified  Pain in  right elbow  ONSET DATE: DOS 03/07/23   SUBJECTIVE:                                                                                                                                                                                           SUBJECTIVE STATEMENT: Pt reports 3/10 pain level. "I took the last pain medication this morning." Pt sees MD for follow up today after PT session. Pt reports he reflexively reached with his arm when with his granddaughter, but did not have lingering pain from this.   PERTINENT HISTORY:  Vit D deficiency, h/o ESRD & kidney, pancreas transplant  PAIN:  Are you having pain? Yes: NPRS scale: 3/10 Pain location: Rt elbow Pain description: ache Aggravating factors: movement Relieving factors: pain meds  PRECAUTIONS:  None  RED FLAGS: None   WEIGHT BEARING RESTRICTIONS:  Yes NWB  FALLS:  Has patient fallen in last 6 months? Yes. 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  PATIENT GOALS:  Painting- steady hand, back to normal   OBJECTIVE:   HAND DOMINANCE:  Right     TREATMENT:                                                                                                                               PROM- elbow, shoulder flex to 90deg, forearm pronation/supination, wrist flex/ext (all within pain limitations).   Edema mobilization STM to biceps and wrist flexors  Seated scap retraction 5" x15     PATIENT EDUCATION:  Education details: Anatomy of condition, POC, HEP, exercise form/rationale, protocol restrictions, and dressings.  PT answered pt's questions concerning the dressings.   Person educated: Patient Education method: Explanation, Demonstration, Tactile cues, Verbal cues, and Handouts Education comprehension: verbalized understanding, returned demonstration, verbal cues required, tactile cues required, and needs further education  HOME EXERCISE PROGRAM: [id number]  Ice  3-5/day 10-15 min with hand elevated Keep shoulder moving, upright posture   ASSESSMENT:  CLINICAL IMPRESSION: Swelling still present, though pt reports improved from previous visit. Continued with edema mobilization techniques as well as STM to distal biceps and wrist flexor compartment where he demonstrates tightness. He was educated about restrictions and precautions as well as activity limitations. Pt has been driving himself to PT. He had good tolerance for gentle PROM of shoulder flexion to 90deg and elbow PROM within pain limitations. Will continue to progress as tolerated with protocol.    OBJECTIVE IMPAIRMENTS: decreased activity tolerance, decreased ROM, decreased strength, increased edema, impaired flexibility, impaired UE functional use, improper body mechanics, postural dysfunction, and pain.   ACTIVITY LIMITATIONS: carrying, lifting, sleeping, bathing, dressing, and reach over head  PARTICIPATION LIMITATIONS: meal prep, cleaning, laundry, shopping, community activity, occupation, and yard work  PERSONAL FACTORS:  see PMH  are also affecting patient's functional outcome.   REHAB POTENTIAL: Good  CLINICAL DECISION MAKING: Stable/uncomplicated  EVALUATION COMPLEXITY: Low   GOALS: Goals reviewed with patient? No  SHORT TERM GOALS: Target date: 8/24  Elbow extension to  neutral Baseline: see obj Goal status: INITIAL  2.  Able to make full fist Baseline: unable due to edema at eval Goal status: INITIAL    LONG TERM GOALS: Target date: POC date  Strength 80% of opp UE via hand held dynamometry testing Baseline: not appropriate to test at eval Goal status: INITIAL  2.  Necessary ROM avail for all ADLs Baseline: very limited at eval Goal status: INITIAL  3.  UEFI to increase to at least 70/80 Baseline: see obj Goal status: INITIAL  4.  Able to perform long arc UE motions with weight without incr pain Baseline: will be necessary for painting.  Goal status:  INITIAL     PLAN:  PT FREQUENCY: 1-2x/week  PT DURATION: 12 weeks  PLANNED INTERVENTIONS: Therapeutic exercises, Therapeutic activity, Neuromuscular re-education, Patient/Family education, Self Care, Joint mobilization, Aquatic Therapy, Dry Needling, Electrical stimulation, Spinal mobilization, Cryotherapy, Moist heat, scar mobilization, Taping, Vasopneumatic device, Ultrasound, Ionotophoresis 4mg /ml Dexamethasone, Manual therapy, and Re-evaluation.  PLAN FOR NEXT SESSION:  Cont per protocol. Wrist/hand motion.  Pt sees MD on Monday.   Riki Altes, PTA  03/20/23 10:02 AM

## 2023-03-22 ENCOUNTER — Ambulatory Visit (HOSPITAL_BASED_OUTPATIENT_CLINIC_OR_DEPARTMENT_OTHER): Payer: Medicaid Other | Attending: Orthopaedic Surgery | Admitting: Physical Therapy

## 2023-03-22 DIAGNOSIS — M25521 Pain in right elbow: Secondary | ICD-10-CM | POA: Insufficient documentation

## 2023-03-22 DIAGNOSIS — M25621 Stiffness of right elbow, not elsewhere classified: Secondary | ICD-10-CM | POA: Diagnosis present

## 2023-03-22 DIAGNOSIS — M6281 Muscle weakness (generalized): Secondary | ICD-10-CM | POA: Insufficient documentation

## 2023-03-22 DIAGNOSIS — R600 Localized edema: Secondary | ICD-10-CM | POA: Insufficient documentation

## 2023-03-22 NOTE — Therapy (Signed)
OUTPATIENT PHYSICAL THERAPY TREATMENT   Patient Name: Mark Miranda MRN: [id number] DOB:1970-12-30, 52 y.o., male Today's Date: 03/23/2023  END OF SESSION:  PT End of Session - 03/22/23 0943     Visit Number 5    Number of Visits 25    Date for PT Re-Evaluation 06/03/23    Authorization Type Workers Comp, Managed MCD    PT Start Time 0940    PT Stop Time 1012    PT Time Calculation (min) 32 min    Activity Tolerance Patient tolerated treatment well;No increased pain    Behavior During Therapy [facility] for tasks assessed/performed                Past Medical History:  Diagnosis Date   BK viremia    Closed fracture of right elbow 03/04/2023   Depression    Diabetic peripheral neuropathy (HCC) 11/24/2015   End stage renal disease (HCC)    Erectile dysfunction    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   Hypercholesterolemia    Hypertension    Metabolic acidosis    Migraines    Recurrent peritonitis (HCC)    Type 1 diabetes with renal manifestations, controlled (HCC)    Vitamin D deficiency    Past Surgical History:  Procedure Laterality Date   AV FISTULA PLACEMENT  01/10/2012   Procedure: ARTERIOVENOUS (AV) FISTULA CREATION;  Surgeon: Sherren Kerns, MD;  Location: [facility] OR;  Service: Vascular;  Laterality: Left;  Left Brachial Cephalic Arteriovenous Fistula   CAPD INSERTION  01/09/2012   Procedure: CONTINUOUS AMBULATORY PERITONEAL DIALYSIS  (CAPD) CATHETER INSERTION;  Surgeon: Ernestene Mention, MD;  Location: [facility] OR;  Service: General;  Laterality: N/A;  Exteriorization PD Cath   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   received left kidney, 3 renal arteries, 1 renal vein, ureter and pancreas from 52 year old   INSERTION OF DIALYSIS CATHETER  01/13/2012   Procedure: INSERTION OF DIALYSIS CATHETER;  Surgeon: Larina Earthly, MD;  Location: [facility] OR;  Service: Vascular;  Laterality: Right;  Internal Jugular   LAPAROSCOPY   03/13/2012   Procedure: LAPAROSCOPY DIAGNOSTIC;  Surgeon: Ardeth Sportsman, MD;  Location: [facility] OR;  Service: General;  Laterality: N/A;   LIVER BIOPSY  04/02/2013   open   ORIF HUMERUS FRACTURE Right 03/07/2023   Procedure: OPEN REDUCTION INTERNAL FIXATION (ORIF) DISTAL HUMERUS FRACTURE;  Surgeon: Huel Cote, MD;  Location: [facility] ORS;  Service: Orthopedics;  Laterality: Right;   PERITONEAL CATHETER INSERTION     PERITONEAL CATHETER REMOVAL     SMALL INTESTINE SURGERY  04/02/2013   TRICEPS TENDON REPAIR  03/07/2023   Procedure: TRICEPS TENDON REPAIR;  Surgeon: Huel Cote, MD;  Location: [facility] ORS;  Service: Orthopedics;;   ULNAR NERVE TRANSPOSITION Right 03/07/2023   Procedure: ULNAR NERVE EXPLORATION;  Surgeon: Huel Cote, MD;  Location: [facility] ORS;  Service: Orthopedics;  Laterality: Right;   Patient Active Problem List   Diagnosis Date Noted   Closed fracture of right distal humerus 03/07/2023   Closed bicondylar fracture of distal end of right humerus 03/04/2023   Type 2 diabetes mellitus with diabetic chronic kidney disease (HCC) 07/22/2022   Depression, major, 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/2022   Erectile dysfunction 07/24/2017   Vitamin D deficiency, unspecified 07/24/2017   Low back pain 02/25/2016   Chronic fatigue 01/25/2016   Diabetic peripheral neuropathy (HCC) 11/24/2015   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   Depression 11/02/2015  Tobacco abuse 11/02/2015   End stage renal disease (HCC) 12/29/2011   HTN (hypertension) 01/25/2011    REFERRING PROVIDER: Steward Drone, MD  REFERRING DIAG: S42.491A (ICD-10-CM) - Other closed displaced fracture of distal end of right humerus, initial encounter  Post op distal humeral ORIF, ulnar nerve neuroplasty, triceps tendon repair Rationale for Evaluation and Treatment: Rehabilitation  THERAPY DIAG:  Pain in right elbow  Stiffness of right elbow, not elsewhere classified  Muscle weakness  (generalized)  Localized edema  ONSET DATE: DOS 03/07/23   SUBJECTIVE:                                                                                                                                                                                           SUBJECTIVE STATEMENT: Pt is 2 weeks and 1 day s/p R distal humeral ORIF, R triceps tendon repair, and R ulnar nerve neuroplasty.  Pt saw MD on Monday and note indicates PROM as tolerated.  Pt had x rays.  Pt states he was instructed to work on elbow flexibility.  Pt reports having increased pain after prior Rx.  Pt states he has pain in elbow t/o the day which worsens as the day progresses.      PERTINENT HISTORY:  Vit D deficiency, h/o ESRD & kidney, pancreas transplant  PAIN:  Are you having pain? Yes: NPRS scale: 1-2/10 Pain location: Rt elbow Pain description: ache Aggravating factors: movement Relieving factors: pain meds  PRECAUTIONS:  None  RED FLAGS: None   WEIGHT BEARING RESTRICTIONS:  Yes NWB  FALLS:  Has patient fallen in last 6 months? Yes. 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  PATIENT GOALS:  Painting- steady hand, back to normal   OBJECTIVE:   HAND DOMINANCE:  Right     TREATMENT:                                                                                                                               Observation:  Pt has steri strips over incision.  Laceration in  distal UE is healing.  Pt has redness in proximal incision.  Incision intact with no drainage.  Pt has improved swelling.   PROM- elbow per tissue tolerance w/n protocol ranges, shoulder flex to 90deg, wrist flex/ext, hand (all within pain limitations) in supine  Pt performed wrist flexion and extension AROM in neutral with UE supported.   PATIENT EDUCATION:  Education details: Anatomy of condition, POC, HEP, exercise form/rationale, protocol restrictions.  Decreasing  frequency to 1x/wk.  PT explained to pt and pt verbalized agreement.   Person educated: Patient Education method: Explanation, Demonstration, Tactile cues, Verbal cues, and Handouts Education comprehension: verbalized understanding, returned demonstration, verbal cues required, tactile cues required, and needs further education  HOME EXERCISE PROGRAM: [id number]  Ice 3-5/day 10-15 min with hand elevated Keep shoulder moving, upright posture   ASSESSMENT:  CLINICAL IMPRESSION: Pt's incision is intact without any drainage.  The laceration in distal UE is healing and his swelling has much improved.  PT performed gentle PROM per pt and tissue tolerance within protocol ranges and pt tolerated PROM well.  He was educated concerning protocol restrictions and activity limitations.  Pt responded well to Rx having no increased pain after Rx.  He should benefit from cont skilled PT services per protocol to address ongoing goals and to assist in restoring desired level of function.    OBJECTIVE IMPAIRMENTS: decreased activity tolerance, decreased ROM, decreased strength, increased edema, impaired flexibility, impaired UE functional use, improper body mechanics, postural dysfunction, and pain.   ACTIVITY LIMITATIONS: carrying, lifting, sleeping, bathing, dressing, and reach over head  PARTICIPATION LIMITATIONS: meal prep, cleaning, laundry, shopping, community activity, occupation, and yard work  PERSONAL FACTORS:  see PMH  are also affecting patient's functional outcome.   REHAB POTENTIAL: Good  CLINICAL DECISION MAKING: Stable/uncomplicated  EVALUATION COMPLEXITY: Low   GOALS: Goals reviewed with patient? No  SHORT TERM GOALS: Target date: 8/24  Elbow extension to  neutral Baseline: see obj Goal status: INITIAL  2.  Able to make full fist Baseline: unable due to edema at eval Goal status: INITIAL    LONG TERM GOALS: Target date: POC date  Strength 80% of opp UE via hand held  dynamometry testing Baseline: not appropriate to test at eval Goal status: INITIAL  2.  Necessary ROM avail for all ADLs Baseline: very limited at eval Goal status: INITIAL  3.  UEFI to increase to at least 70/80 Baseline: see obj Goal status: INITIAL  4.  Able to perform long arc UE motions with weight without incr pain Baseline: will be necessary for painting.  Goal status: INITIAL     PLAN:  PT FREQUENCY: 1-2x/week  PT DURATION: 12 weeks  PLANNED INTERVENTIONS: Therapeutic exercises, Therapeutic activity, Neuromuscular re-education, Patient/Family education, Self Care, Joint mobilization, Aquatic Therapy, Dry Needling, Electrical stimulation, Spinal mobilization, Cryotherapy, Moist heat, scar mobilization, Taping, Vasopneumatic device, Ultrasound, Ionotophoresis 4mg /ml Dexamethasone, Manual therapy, and Re-evaluation.  PLAN FOR NEXT SESSION:  Cont with ROM and ther ex per protocol.  Decrease frequency to 1x/wk.   Audie Clear III PT, DPT 03/23/23 5:25 PM

## 2023-03-23 ENCOUNTER — Encounter (HOSPITAL_BASED_OUTPATIENT_CLINIC_OR_DEPARTMENT_OTHER): Payer: Self-pay | Admitting: Physical Therapy

## 2023-03-27 ENCOUNTER — Encounter (HOSPITAL_BASED_OUTPATIENT_CLINIC_OR_DEPARTMENT_OTHER): Payer: Self-pay | Admitting: Orthopaedic Surgery

## 2023-03-27 ENCOUNTER — Encounter (HOSPITAL_BASED_OUTPATIENT_CLINIC_OR_DEPARTMENT_OTHER): Payer: Self-pay

## 2023-03-27 ENCOUNTER — Other Ambulatory Visit: Payer: Self-pay | Admitting: Orthopaedic Surgery

## 2023-03-27 ENCOUNTER — Ambulatory Visit (HOSPITAL_BASED_OUTPATIENT_CLINIC_OR_DEPARTMENT_OTHER): Payer: Worker's Compensation | Attending: Orthopaedic Surgery

## 2023-03-27 DIAGNOSIS — R600 Localized edema: Secondary | ICD-10-CM | POA: Insufficient documentation

## 2023-03-27 DIAGNOSIS — M25521 Pain in right elbow: Secondary | ICD-10-CM | POA: Insufficient documentation

## 2023-03-27 DIAGNOSIS — M25621 Stiffness of right elbow, not elsewhere classified: Secondary | ICD-10-CM | POA: Insufficient documentation

## 2023-03-27 DIAGNOSIS — M6281 Muscle weakness (generalized): Secondary | ICD-10-CM | POA: Insufficient documentation

## 2023-03-27 NOTE — Therapy (Signed)
OUTPATIENT PHYSICAL THERAPY TREATMENT   Patient Name: Mark Miranda MRN: [id number] DOB:1971/04/10, 52 y.o., male Today's Date: 03/27/2023  END OF SESSION:  PT End of Session - 03/27/23 1040     Visit Number 6    Number of Visits 25    Date for PT Re-Evaluation 06/03/23    Authorization Type Workers Comp, Managed MCD    PT Start Time 1015    PT Stop Time 1100    PT Time Calculation (min) 45 min    Activity Tolerance Patient tolerated treatment well    Behavior During Therapy WFL for tasks assessed/performed                 Past Medical History:  Diagnosis Date   BK viremia    Closed fracture of right elbow 03/04/2023   Depression    Diabetic peripheral neuropathy (HCC) 11/24/2015   End stage renal disease (HCC)    Erectile dysfunction    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   Hypercholesterolemia    Hypertension    Metabolic acidosis    Migraines    Recurrent peritonitis (HCC)    Type 1 diabetes with renal manifestations, controlled (HCC)    Vitamin D deficiency    Past Surgical History:  Procedure Laterality Date   AV FISTULA PLACEMENT  01/10/2012   Procedure: ARTERIOVENOUS (AV) FISTULA CREATION;  Surgeon: Sherren Kerns, MD;  Location: [facility] OR;  Service: Vascular;  Laterality: Left;  Left Brachial Cephalic Arteriovenous Fistula   CAPD INSERTION  01/09/2012   Procedure: CONTINUOUS AMBULATORY PERITONEAL DIALYSIS  (CAPD) CATHETER INSERTION;  Surgeon: Ernestene Mention, MD;  Location: [facility] OR;  Service: General;  Laterality: N/A;  Exteriorization PD Cath   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   received left kidney, 3 renal arteries, 1 renal vein, ureter and pancreas from 52 year old   INSERTION OF DIALYSIS CATHETER  01/13/2012   Procedure: INSERTION OF DIALYSIS CATHETER;  Surgeon: Larina Earthly, MD;  Location: [facility] OR;  Service: Vascular;  Laterality: Right;  Internal Jugular   LAPAROSCOPY  03/13/2012   Procedure:  LAPAROSCOPY DIAGNOSTIC;  Surgeon: Ardeth Sportsman, MD;  Location: [facility] OR;  Service: General;  Laterality: N/A;   LIVER BIOPSY  04/02/2013   open   ORIF HUMERUS FRACTURE Right 03/07/2023   Procedure: OPEN REDUCTION INTERNAL FIXATION (ORIF) DISTAL HUMERUS FRACTURE;  Surgeon: Huel Cote, MD;  Location: [facility] ORS;  Service: Orthopedics;  Laterality: Right;   PERITONEAL CATHETER INSERTION     PERITONEAL CATHETER REMOVAL     SMALL INTESTINE SURGERY  04/02/2013   TRICEPS TENDON REPAIR  03/07/2023   Procedure: TRICEPS TENDON REPAIR;  Surgeon: Huel Cote, MD;  Location: [facility] ORS;  Service: Orthopedics;;   ULNAR NERVE TRANSPOSITION Right 03/07/2023   Procedure: ULNAR NERVE EXPLORATION;  Surgeon: Huel Cote, MD;  Location: [facility] ORS;  Service: Orthopedics;  Laterality: Right;   Patient Active Problem List   Diagnosis Date Noted   Closed fracture of right distal humerus 03/07/2023   Closed bicondylar fracture of distal end of right humerus 03/04/2023   Type 2 diabetes mellitus with diabetic chronic kidney disease (HCC) 07/22/2022   Depression, major, 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/2022   Erectile dysfunction 07/24/2017   Vitamin D deficiency, unspecified 07/24/2017   Low back pain 02/25/2016   Chronic fatigue 01/25/2016   Diabetic peripheral neuropathy (HCC) 11/24/2015   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   Depression 11/02/2015   Tobacco  abuse 11/02/2015   End stage renal disease (HCC) 12/29/2011   HTN (hypertension) 01/25/2011    REFERRING PROVIDER: Steward Drone, MD  REFERRING DIAG: [phone] (ICD-10-CM) - Other closed displaced fracture of distal end of right humerus, initial encounter  Post op distal humeral ORIF, ulnar nerve neuroplasty, triceps tendon repair Rationale for Evaluation and Treatment: Rehabilitation  THERAPY DIAG:  Pain in right elbow  Stiffness of right elbow, not elsewhere classified  Muscle weakness  (generalized)  Localized edema  ONSET DATE: DOS 03/07/23   SUBJECTIVE:                                                                                                                                                                                           SUBJECTIVE STATEMENT:  Pt will be 3 weeks s/p tomorrow. He reports he has pain throughout the day. "Do you think a brace would help me?"     PERTINENT HISTORY:  Vit D deficiency, h/o ESRD & kidney, pancreas transplant  PAIN:  Are you having pain? Yes: NPRS scale: 1-2/10 Pain location: Rt elbow Pain description: ache Aggravating factors: movement Relieving factors: pain meds  PRECAUTIONS:  None  RED FLAGS: None   WEIGHT BEARING RESTRICTIONS:  Yes NWB  FALLS:  Has patient fallen in last 6 months? Yes. 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  PATIENT GOALS:  Painting- steady hand, back to normal   OBJECTIVE:   HAND DOMINANCE:  Right     TREATMENT:                                                                                                                               Observation:  Redness and scabbing at mid incision. No drainage. No more steri strips present. "I took them off".  PROM- elbow per tissue tolerance w/n protocol ranges, shoulder flex to 90deg, wrist flex/ext, hand (all within pain limitations) in supine  Gentle passive stretch of wrist extensors   Pt performed wrist flexion and extension AROM in neutral with UE supported.  Seated  scap retraction 5" x20  STM to anterior forearm  PATIENT EDUCATION:  Education details: Anatomy of condition, POC, HEP, exercise form/rationale, protocol restrictions.  Decreasing frequency to 1x/wk.  PT explained to pt and pt verbalized agreement.   Person educated: Patient Education method: Explanation, Demonstration, Tactile cues, Verbal cues, and Handouts Education comprehension: verbalized  understanding, returned demonstration, verbal cues required, tactile cues required, and needs further education  HOME EXERCISE PROGRAM: [id number]  Ice 3-5/day 10-15 min with hand elevated Keep shoulder moving, upright posture   ASSESSMENT:  CLINICAL IMPRESSION:  Instructed pt to contact MD regarding brace. Small areas of redness and scabbing at proximal incision, will monitor. Pt states he is still taking antibiotics. Continued with gentle ROM interventions with good tolerance. Instructed pt to use ice on area as he reports he has not been doing so. Will continue to progress as tolerated.    OBJECTIVE IMPAIRMENTS: decreased activity tolerance, decreased ROM, decreased strength, increased edema, impaired flexibility, impaired UE functional use, improper body mechanics, postural dysfunction, and pain.   ACTIVITY LIMITATIONS: carrying, lifting, sleeping, bathing, dressing, and reach over head  PARTICIPATION LIMITATIONS: meal prep, cleaning, laundry, shopping, community activity, occupation, and yard work  PERSONAL FACTORS:  see PMH  are also affecting patient's functional outcome.   REHAB POTENTIAL: Good  CLINICAL DECISION MAKING: Stable/uncomplicated  EVALUATION COMPLEXITY: Low   GOALS: Goals reviewed with patient? No  SHORT TERM GOALS: Target date: 8/24  Elbow extension to  neutral Baseline: see obj Goal status: INITIAL  2.  Able to make full fist Baseline: unable due to edema at eval Goal status: INITIAL    LONG TERM GOALS: Target date: POC date  Strength 80% of opp UE via hand held dynamometry testing Baseline: not appropriate to test at eval Goal status: INITIAL  2.  Necessary ROM avail for all ADLs Baseline: very limited at eval Goal status: INITIAL  3.  UEFI to increase to at least 70/80 Baseline: see obj Goal status: INITIAL  4.  Able to perform long arc UE motions with weight without incr pain Baseline: will be necessary for painting.  Goal status:  INITIAL     PLAN:  PT FREQUENCY: 1-2x/week  PT DURATION: 12 weeks  PLANNED INTERVENTIONS: Therapeutic exercises, Therapeutic activity, Neuromuscular re-education, Patient/Family education, Self Care, Joint mobilization, Aquatic Therapy, Dry Needling, Electrical stimulation, Spinal mobilization, Cryotherapy, Moist heat, scar mobilization, Taping, Vasopneumatic device, Ultrasound, Ionotophoresis 4mg /ml Dexamethasone, Manual therapy, and Re-evaluation.  PLAN FOR NEXT SESSION:  Cont with ROM and ther ex per protocol.  Decrease frequency to 1x/wk.   Riki Altes, PTA  03/27/23 11:50 AM

## 2023-03-28 ENCOUNTER — Other Ambulatory Visit: Payer: Self-pay | Admitting: Orthopaedic Surgery

## 2023-03-28 DIAGNOSIS — S42491A Other displaced fracture of lower end of right humerus, initial encounter for closed fracture: Secondary | ICD-10-CM

## 2023-03-29 ENCOUNTER — Encounter (HOSPITAL_BASED_OUTPATIENT_CLINIC_OR_DEPARTMENT_OTHER): Payer: Medicaid Other | Admitting: Physical Therapy

## 2023-03-30 ENCOUNTER — Ambulatory Visit (INDEPENDENT_AMBULATORY_CARE_PROVIDER_SITE_OTHER): Payer: Worker's Compensation | Admitting: Student

## 2023-03-30 ENCOUNTER — Encounter (HOSPITAL_BASED_OUTPATIENT_CLINIC_OR_DEPARTMENT_OTHER): Payer: Self-pay | Admitting: Student

## 2023-03-30 DIAGNOSIS — S42491A Other displaced fracture of lower end of right humerus, initial encounter for closed fracture: Secondary | ICD-10-CM

## 2023-03-30 NOTE — Progress Notes (Signed)
Post Operative Evaluation    Procedure/Date of Surgery: Status post right distal humerus open reduction internal fixation 7/30  Interval History:   Patient presents today 3 weeks status post the above procedure.  States that he noticed a slight separation of the incision yesterday.  He is here today to have this evaluated.  Denies any fever or chills.  Denies worsening redness or drainage from the incision.   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   Hypercholesterolemia    Hypertension    Metabolic acidosis    Migraines    Recurrent peritonitis (HCC)    Type 1 diabetes with renal manifestations, controlled (HCC)    Vitamin D deficiency    Past Surgical History:  Procedure Laterality Date   AV FISTULA PLACEMENT  01/10/2012   Procedure: ARTERIOVENOUS (AV) FISTULA CREATION;  Surgeon: Sherren Kerns, MD;  Location: [facility] OR;  Service: Vascular;  Laterality: Left;  Left Brachial Cephalic Arteriovenous Fistula   CAPD INSERTION  01/09/2012   Procedure: CONTINUOUS AMBULATORY PERITONEAL DIALYSIS  (CAPD) CATHETER INSERTION;  Surgeon: Ernestene Mention, MD;  Location: [facility] OR;  Service: General;  Laterality: N/A;  Exteriorization PD Cath   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   received left kidney, 3 renal arteries, 1 renal vein, ureter and pancreas from 52 year old   INSERTION OF DIALYSIS CATHETER  01/13/2012   Procedure: INSERTION OF DIALYSIS CATHETER;  Surgeon: Larina Earthly, MD;  Location: [facility] OR;  Service: Vascular;  Laterality: Right;  Internal Jugular   LAPAROSCOPY  03/13/2012   Procedure: LAPAROSCOPY DIAGNOSTIC;  Surgeon: Ardeth Sportsman, MD;  Location: [facility] OR;  Service: General;  Laterality: N/A;   LIVER BIOPSY  04/02/2013   open   ORIF HUMERUS FRACTURE Right 03/07/2023   Procedure: OPEN REDUCTION INTERNAL FIXATION (ORIF) DISTAL HUMERUS FRACTURE;  Surgeon: Huel Cote, MD;  Location: [facility] ORS;  Service: Orthopedics;  Laterality: Right;   PERITONEAL CATHETER INSERTION     PERITONEAL CATHETER REMOVAL     SMALL INTESTINE SURGERY  04/02/2013   TRICEPS TENDON REPAIR  03/07/2023   Procedure: TRICEPS TENDON REPAIR;  Surgeon: Huel Cote, MD;  Location: [facility] ORS;  Service: Orthopedics;;   ULNAR NERVE TRANSPOSITION Right 03/07/2023   Procedure: ULNAR NERVE EXPLORATION;  Surgeon: Huel Cote, MD;  Location: [facility] ORS;  Service: Orthopedics;  Laterality: Right;   Social History   Socioeconomic History   Marital status: Married    Spouse name: Byrd Hesselbach   Number of children: 4   Years of education: 10   Highest education level: Not on file  Occupational History   Occupation: Education administrator  Tobacco Use   Smoking status: Every Day    Current packs/day: 1.00    Average packs/day: 1 pack/day for 22.0 years (22.0 ttl pk-yrs)    Types: Cigarettes   Smokeless tobacco: Never  Vaping Use   Vaping status: Every Day   Substances: Nicotine  Substance and Sexual Activity   Alcohol use: Yes    Alcohol/week: 0.0 standard  drinks of alcohol    Comment: socially   Drug use: No   Sexual activity: Yes  Other Topics Concern   Not on file  Social History Narrative   Paints houses   Wife works at a Retail banker at [employer]   4 children age 74 to 41   Regular exercise-no current exercise regimen   Caffeine Use-no   Social Determinants of Health   Financial Resource Strain: Not on file  Food Insecurity: Not on file  Transportation Needs: Not on file  Physical Activity: Not on file  Stress: Not on file  Social Connections: Not on file   Family History  Problem Relation Age of Onset   Diabetes Mother    Hypertension Mother     Hyperlipidemia Mother    Cancer Father        glioblastoma   No Known Allergies Current Outpatient Medications  Medication Sig Dispense Refill   aspirin EC 325 MG tablet Take 1 tablet (325 mg total) by mouth daily. 14 tablet 0   cephALEXin (KEFLEX) 500 MG capsule Take 1 capsule (500 mg total) by mouth 4 (four) times daily. 20 capsule 0   dapagliflozin propanediol (FARXIGA) 10 MG TABS tablet TAKE 1 TABLET BY MOUTH DAILY BEFORE BREAKFAST. 90 tablet 0   HYDROcodone-acetaminophen (NORCO/VICODIN) 5-325 MG tablet Take 1 tablet by mouth every 6 (six) hours as needed for moderate pain. 20 tablet 0   lisinopril (ZESTRIL) 10 MG tablet Take 1 tablet (10 mg total) by mouth daily. 90 tablet 0   mycophenolate (MYFORTIC) 180 MG EC tablet Take 4 tablets by mouth 2 (two) times daily.     oxyCODONE (ROXICODONE) 5 MG immediate release tablet Take 1 tablet (5 mg total) by mouth every 4 (four) hours as needed for severe pain or breakthrough pain. 20 tablet 0   oxyCODONE (ROXICODONE) 5 MG immediate release tablet Take 1 tablet (5 mg total) by mouth every 4 (four) hours as needed for severe pain or breakthrough pain. 20 tablet 0   sildenafil (VIAGRA) 100 MG tablet Take 0.5-1 tablets (50-100 mg total) by mouth daily as needed for erectile dysfunction. 15 tablet 6   sitaGLIPtin (JANUVIA) 50 MG tablet Take 50 mg by mouth daily.     tacrolimus (PROGRAF) 1 MG capsule Take 5 mg by mouth 2 (two) times daily. Pt take 5 mg in the morning and 4 mg at night     No current facility-administered medications for this visit.   No results found.  Review of Systems:   A ROS was performed including pertinent positives and negatives as documented in the HPI.   Musculoskeletal Exam:    There were no vitals taken for this visit.  Right elbow incision appears without evidence of erythema or drainage.  Slight loss of approximation in the middle of the incision over the olecranon, however no evidence of complete  dehiscence.  Imaging:    Assessment:   2 weeks status post right distal humeral open reduction internal fixation here today for a wound check.  No evidence or concern today for any infection.  There is some partial separation of the wound over the olecranon, however this does appear mild.  I did clean the area and placed 3 Steri-Strips with Dermabond which I believe will help correct this.  I will plan to see him back sometime next week to recheck this and ensure this goes on to adequate closure.  Plan :    -Return to clinic next week for repeat evaluation  I personally saw and evaluated the patient, and participated in the management and treatment plan.   Hazle Nordmann, PA-C Orthopedics   This document was dictated using Conservation officer, historic buildings. A reasonable attempt at proof reading has been made to minimize errors.

## 2023-04-05 ENCOUNTER — Encounter (HOSPITAL_BASED_OUTPATIENT_CLINIC_OR_DEPARTMENT_OTHER): Payer: Self-pay | Admitting: Student

## 2023-04-05 ENCOUNTER — Encounter (HOSPITAL_BASED_OUTPATIENT_CLINIC_OR_DEPARTMENT_OTHER): Payer: Self-pay

## 2023-04-05 ENCOUNTER — Ambulatory Visit (INDEPENDENT_AMBULATORY_CARE_PROVIDER_SITE_OTHER): Payer: Worker's Compensation | Admitting: Student

## 2023-04-05 ENCOUNTER — Ambulatory Visit (HOSPITAL_BASED_OUTPATIENT_CLINIC_OR_DEPARTMENT_OTHER): Payer: Worker's Compensation

## 2023-04-05 DIAGNOSIS — M25521 Pain in right elbow: Secondary | ICD-10-CM | POA: Diagnosis not present

## 2023-04-05 DIAGNOSIS — M6281 Muscle weakness (generalized): Secondary | ICD-10-CM

## 2023-04-05 DIAGNOSIS — S42491A Other displaced fracture of lower end of right humerus, initial encounter for closed fracture: Secondary | ICD-10-CM

## 2023-04-05 DIAGNOSIS — R600 Localized edema: Secondary | ICD-10-CM

## 2023-04-05 DIAGNOSIS — M25621 Stiffness of right elbow, not elsewhere classified: Secondary | ICD-10-CM

## 2023-04-05 MED ORDER — CEPHALEXIN 500 MG PO CAPS: 500.0000 mg | ORAL_CAPSULE | Freq: Four times a day (QID) | ORAL | 0 refills | Status: AC

## 2023-04-05 NOTE — Therapy (Signed)
OUTPATIENT PHYSICAL THERAPY TREATMENT   Patient Name: Mark Miranda MRN: [id number] DOB:Sep 22, 1970, 52 y.o., male Today's Date: 04/05/2023  END OF SESSION:  PT End of Session - 04/05/23 0851     Visit Number 7    Number of Visits 25    Date for PT Re-Evaluation 06/03/23    Authorization Type Workers Comp, Managed MCD    PT Start Time [phone]    PT Stop Time [phone]    PT Time Calculation (min) 36 min    Activity Tolerance Patient tolerated treatment well    Behavior During Therapy [facility] for tasks assessed/performed                 Past Medical History:  Diagnosis Date   BK viremia    Closed fracture of right elbow 03/04/2023   Depression    Diabetic peripheral neuropathy (HCC) 11/24/2015   End stage renal disease (HCC)    Erectile dysfunction    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   Hypercholesterolemia    Hypertension    Metabolic acidosis    Migraines    Recurrent peritonitis (HCC)    Type 1 diabetes with renal manifestations, controlled (HCC)    Vitamin D deficiency    Past Surgical History:  Procedure Laterality Date   AV FISTULA PLACEMENT  01/10/2012   Procedure: ARTERIOVENOUS (AV) FISTULA CREATION;  Surgeon: Sherren Kerns, MD;  Location: [facility] OR;  Service: Vascular;  Laterality: Left;  Left Brachial Cephalic Arteriovenous Fistula   CAPD INSERTION  01/09/2012   Procedure: CONTINUOUS AMBULATORY PERITONEAL DIALYSIS  (CAPD) CATHETER INSERTION;  Surgeon: Ernestene Mention, MD;  Location: [facility] OR;  Service: General;  Laterality: N/A;  Exteriorization PD Cath   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   received left kidney, 3 renal arteries, 1 renal vein, ureter and pancreas from 52 year old   INSERTION OF DIALYSIS CATHETER  01/13/2012   Procedure: INSERTION OF DIALYSIS CATHETER;  Surgeon: Larina Earthly, MD;  Location: [facility] OR;  Service: Vascular;  Laterality: Right;  Internal Jugular   LAPAROSCOPY  03/13/2012   Procedure:  LAPAROSCOPY DIAGNOSTIC;  Surgeon: Ardeth Sportsman, MD;  Location: [facility] OR;  Service: General;  Laterality: N/A;   LIVER BIOPSY  04/02/2013   open   ORIF HUMERUS FRACTURE Right 03/07/2023   Procedure: OPEN REDUCTION INTERNAL FIXATION (ORIF) DISTAL HUMERUS FRACTURE;  Surgeon: Huel Cote, MD;  Location: [facility] ORS;  Service: Orthopedics;  Laterality: Right;   PERITONEAL CATHETER INSERTION     PERITONEAL CATHETER REMOVAL     SMALL INTESTINE SURGERY  04/02/2013   TRICEPS TENDON REPAIR  03/07/2023   Procedure: TRICEPS TENDON REPAIR;  Surgeon: Huel Cote, MD;  Location: [facility] ORS;  Service: Orthopedics;;   ULNAR NERVE TRANSPOSITION Right 03/07/2023   Procedure: ULNAR NERVE EXPLORATION;  Surgeon: Huel Cote, MD;  Location: [facility] ORS;  Service: Orthopedics;  Laterality: Right;   Patient Active Problem List   Diagnosis Date Noted   Closed fracture of right distal humerus 03/07/2023   Closed bicondylar fracture of distal end of right humerus 03/04/2023   Type 2 diabetes mellitus with diabetic chronic kidney disease (HCC) 07/22/2022   Depression, major, 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/2022   Erectile dysfunction 07/24/2017   Vitamin D deficiency, unspecified 07/24/2017   Low back pain 02/25/2016   Chronic fatigue 01/25/2016   Diabetic peripheral neuropathy (HCC) 11/24/2015   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   Depression 11/02/2015   Tobacco  abuse 11/02/2015   End stage renal disease (HCC) 12/29/2011   HTN (hypertension) 01/25/2011    REFERRING PROVIDER: Steward Drone, MD  REFERRING DIAG: [phone] (ICD-10-CM) - Other closed displaced fracture of distal end of right humerus, initial encounter  Post op distal humeral ORIF, ulnar nerve neuroplasty, triceps tendon repair Rationale for Evaluation and Treatment: Rehabilitation  THERAPY DIAG:  Pain in right elbow  Stiffness of right elbow, not elsewhere classified  Muscle weakness  (generalized)  Localized edema  ONSET DATE: DOS 03/07/23   SUBJECTIVE:                                                                                                                                                                                           SUBJECTIVE STATEMENT:  2/10 pain level at entry. Pain with movement, though does have mild pain at rest. Has been trying to avoid excessive elbow movement. Reports climbing stairs can cause pain in elbow from the movement. Pt saw PA on 8/22 regarding incision opening over olecranon. Has f/u with PA today.      PERTINENT HISTORY:  Vit D deficiency, h/o ESRD & kidney, pancreas transplant  PAIN:  Are you having pain? Yes: NPRS scale: 1-2/10 Pain location: Rt elbow Pain description: ache Aggravating factors: movement Relieving factors: pain meds  PRECAUTIONS:  None  RED FLAGS: None   WEIGHT BEARING RESTRICTIONS:  Yes NWB  FALLS:  Has patient fallen in last 6 months? Yes. 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  PATIENT GOALS:  Painting- steady hand, back to normal   OBJECTIVE:   HAND DOMINANCE:  Right     TREATMENT:                                                                                                                               Observation:  Redness and scabbing at mid incision. Has steri strips over olecranon placed by PA. Mild drainage over mid incision area superior to olecranon.   PROM- elbow per tissue tolerance w/n protocol ranges, shoulder flex  to 90deg, wrist flex/ext, hand (all within pain limitations) in supine  Gentle passive stretch of wrist extensors/flexors   Pt performed wrist flexion and extension AROM in neutral with UE supported.  Seated scap retraction 5" x20  STM to forearm  PATIENT EDUCATION:  Education details: Anatomy of condition, POC, HEP, exercise form/rationale, protocol restrictions.  Decreasing frequency to  1x/wk.  PT explained to pt and pt verbalized agreement.   Person educated: Patient Education method: Explanation, Demonstration, Tactile cues, Verbal cues, and Handouts Education comprehension: verbalized understanding, returned demonstration, verbal cues required, tactile cues required, and needs further education  HOME EXERCISE PROGRAM: [id number]  Ice 3-5/day 10-15 min with hand elevated Keep shoulder moving, upright posture   ASSESSMENT:  CLINICAL IMPRESSION:  Pt with very mild brown drainage from mid incision during session. Pt to have PA look at this today. He tolerated gentle PROM for UE within protocol limits today. Tightness in forearm mm still noted, though improved tightness in biceps from previous sessions. Educated pt on continuing to avoid lifting. Will continue to progress as tolerated.   OBJECTIVE IMPAIRMENTS: decreased activity tolerance, decreased ROM, decreased strength, increased edema, impaired flexibility, impaired UE functional use, improper body mechanics, postural dysfunction, and pain.   ACTIVITY LIMITATIONS: carrying, lifting, sleeping, bathing, dressing, and reach over head  PARTICIPATION LIMITATIONS: meal prep, cleaning, laundry, shopping, community activity, occupation, and yard work  PERSONAL FACTORS:  see PMH  are also affecting patient's functional outcome.   REHAB POTENTIAL: Good  CLINICAL DECISION MAKING: Stable/uncomplicated  EVALUATION COMPLEXITY: Low   GOALS: Goals reviewed with patient? No  SHORT TERM GOALS: Target date: 8/24  Elbow extension to  neutral Baseline: see obj Goal status: INITIAL  2.  Able to make full fist Baseline: unable due to edema at eval Goal status: INITIAL    LONG TERM GOALS: Target date: POC date  Strength 80% of opp UE via hand held dynamometry testing Baseline: not appropriate to test at eval Goal status: INITIAL  2.  Necessary ROM avail for all ADLs Baseline: very limited at eval Goal status:  INITIAL  3.  UEFI to increase to at least 70/80 Baseline: see obj Goal status: INITIAL  4.  Able to perform long arc UE motions with weight without incr pain Baseline: will be necessary for painting.  Goal status: INITIAL     PLAN:  PT FREQUENCY: 1-2x/week  PT DURATION: 12 weeks  PLANNED INTERVENTIONS: Therapeutic exercises, Therapeutic activity, Neuromuscular re-education, Patient/Family education, Self Care, Joint mobilization, Aquatic Therapy, Dry Needling, Electrical stimulation, Spinal mobilization, Cryotherapy, Moist heat, scar mobilization, Taping, Vasopneumatic device, Ultrasound, Ionotophoresis 4mg /ml Dexamethasone, Manual therapy, and Re-evaluation.  PLAN FOR NEXT SESSION:  Cont with ROM and ther ex per protocol.  Decrease frequency to 1x/wk.   Riki Altes, PTA  04/05/23 10:37 AM

## 2023-04-05 NOTE — Progress Notes (Signed)
Post Operative Evaluation    Procedure/Date of Surgery: Status post right distal humerus open reduction internal fixation 7/30  Interval History:   Patient presents today for follow-up wound check.  He states that he has kept the Steri-Strips on and that the incision looks much better.  He does note that at the superior margin of the incision, he has had a small amount of pus drainage.  Denies fever, chills, or any other concerns today.   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   Hypercholesterolemia    Hypertension    Metabolic acidosis    Migraines    Recurrent peritonitis (HCC)    Type 1 diabetes with renal manifestations, controlled (HCC)    Vitamin D deficiency    Past Surgical History:  Procedure Laterality Date   AV FISTULA PLACEMENT  01/10/2012   Procedure: ARTERIOVENOUS (AV) FISTULA CREATION;  Surgeon: Sherren Kerns, MD;  Location: [facility] OR;  Service: Vascular;  Laterality: Left;  Left Brachial Cephalic Arteriovenous Fistula   CAPD INSERTION  01/09/2012   Procedure: CONTINUOUS AMBULATORY PERITONEAL DIALYSIS  (CAPD) CATHETER INSERTION;  Surgeon: Ernestene Mention, MD;  Location: [facility] OR;  Service: General;  Laterality: N/A;  Exteriorization PD Cath   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   received left kidney, 3 renal arteries, 1 renal vein, ureter and pancreas from 52 year old   INSERTION OF DIALYSIS CATHETER  01/13/2012   Procedure: INSERTION OF DIALYSIS CATHETER;  Surgeon: Larina Earthly, MD;  Location: [facility] OR;  Service: Vascular;  Laterality: Right;  Internal Jugular   LAPAROSCOPY  03/13/2012   Procedure: LAPAROSCOPY  DIAGNOSTIC;  Surgeon: Ardeth Sportsman, MD;  Location: [facility] OR;  Service: General;  Laterality: N/A;   LIVER BIOPSY  04/02/2013   open   ORIF HUMERUS FRACTURE Right 03/07/2023   Procedure: OPEN REDUCTION INTERNAL FIXATION (ORIF) DISTAL HUMERUS FRACTURE;  Surgeon: Huel Cote, MD;  Location: [facility] ORS;  Service: Orthopedics;  Laterality: Right;   PERITONEAL CATHETER INSERTION     PERITONEAL CATHETER REMOVAL     SMALL INTESTINE SURGERY  04/02/2013   TRICEPS TENDON REPAIR  03/07/2023   Procedure: TRICEPS TENDON REPAIR;  Surgeon: Huel Cote, MD;  Location: [facility] ORS;  Service: Orthopedics;;   ULNAR NERVE TRANSPOSITION Right 03/07/2023   Procedure: ULNAR NERVE EXPLORATION;  Surgeon: Huel Cote, MD;  Location: [facility] ORS;  Service: Orthopedics;  Laterality: Right;   Social History   Socioeconomic History   Marital status: Married    Spouse name: Byrd Hesselbach   Number of children: 4   Years of education: 10   Highest education level: Not on file  Occupational History   Occupation: Education administrator  Tobacco Use   Smoking status: Every Day    Current packs/day: 1.00    Average packs/day: 1 pack/day for 22.0 years (22.0 ttl pk-yrs)    Types: Cigarettes   Smokeless tobacco: Never  Vaping Use   Vaping status: Every Day   Substances: Nicotine  Substance and Sexual Activity   Alcohol  use: Yes    Alcohol/week: 0.0 standard drinks of alcohol    Comment: socially   Drug use: No   Sexual activity: Yes  Other Topics Concern   Not on file  Social History Narrative   Paints houses   Wife works at a Retail banker at [employer]   4 children age 76 to 25   Regular exercise-no current exercise regimen   Caffeine Use-no   Social Determinants of Health   Financial Resource Strain: Not on file  Food Insecurity: Not on file  Transportation Needs: Not on file  Physical Activity: Not on file  Stress: Not on file  Social Connections: Not on file   Family History  Problem Relation Age of Onset   Diabetes Mother     Hypertension Mother    Hyperlipidemia Mother    Cancer Father        glioblastoma   No Known Allergies Current Outpatient Medications  Medication Sig Dispense Refill   cephALEXin (KEFLEX) 500 MG capsule Take 1 capsule (500 mg total) by mouth 4 (four) times daily for 3 days. 12 capsule 0   aspirin EC 325 MG tablet Take 1 tablet (325 mg total) by mouth daily. 14 tablet 0   dapagliflozin propanediol (FARXIGA) 10 MG TABS tablet TAKE 1 TABLET BY MOUTH DAILY BEFORE BREAKFAST. 90 tablet 0   HYDROcodone-acetaminophen (NORCO/VICODIN) 5-325 MG tablet Take 1 tablet by mouth every 6 (six) hours as needed for moderate pain. 20 tablet 0   lisinopril (ZESTRIL) 10 MG tablet Take 1 tablet (10 mg total) by mouth daily. 90 tablet 0   mycophenolate (MYFORTIC) 180 MG EC tablet Take 4 tablets by mouth 2 (two) times daily.     oxyCODONE (ROXICODONE) 5 MG immediate release tablet Take 1 tablet (5 mg total) by mouth every 4 (four) hours as needed for severe pain or breakthrough pain. 20 tablet 0   oxyCODONE (ROXICODONE) 5 MG immediate release tablet Take 1 tablet (5 mg total) by mouth every 4 (four) hours as needed for severe pain or breakthrough pain. 20 tablet 0   sildenafil (VIAGRA) 100 MG tablet Take 0.5-1 tablets (50-100 mg total) by mouth daily as needed for erectile dysfunction. 15 tablet 6   sitaGLIPtin (JANUVIA) 50 MG tablet Take 50 mg by mouth daily.     tacrolimus (PROGRAF) 1 MG capsule Take 5 mg by mouth 2 (two) times daily. Pt take 5 mg in the morning and 4 mg at night     No current facility-administered medications for this visit.   No results found.  Review of Systems:   A ROS was performed including pertinent positives and negatives as documented in the HPI.   Musculoskeletal Exam:    There were no vitals taken for this visit.  Middle portion of right elbow incision over the olecranon is well-approximated without evidence of erythema, drainage, or separation.  Small area of crusting at the  superior margin of the incision with small amount of pus able to be expressed with pressure.  Otherwise incision is well-appearing and in proper stages of healing.  Imaging:    Assessment:   Garlon is 4 weeks status post right distal humerus ORIF.  I placed Steri-Strips over the middle portion of the incision at the olecranon last visit to help hold approximation which does appear to have improved today.  He does have what appears to be a small suture abscess at the superior incision with slight drainage, but unable to locate any protruding sutures.  This  should improve with short course of antibiotics so I will start him on Keflex today.  Will plan to have him keep [place] updated on the incision appearance, however we will plan to see him back as scheduled on 9/13 unless needed for evaluation sooner.  Plan :    -Start Keflex 500 mg 4 times daily for 3 days -Return to clinic as scheduled on 9/13 for postop visit with Dr. Steward Drone     I personally saw and evaluated the patient, and participated in the management and treatment plan.   Hazle Nordmann, PA-C Orthopedics

## 2023-04-07 ENCOUNTER — Encounter (HOSPITAL_BASED_OUTPATIENT_CLINIC_OR_DEPARTMENT_OTHER): Payer: Self-pay

## 2023-04-07 ENCOUNTER — Ambulatory Visit (HOSPITAL_BASED_OUTPATIENT_CLINIC_OR_DEPARTMENT_OTHER): Payer: Worker's Compensation

## 2023-04-07 DIAGNOSIS — M25521 Pain in right elbow: Secondary | ICD-10-CM

## 2023-04-07 DIAGNOSIS — M25621 Stiffness of right elbow, not elsewhere classified: Secondary | ICD-10-CM

## 2023-04-07 DIAGNOSIS — M6281 Muscle weakness (generalized): Secondary | ICD-10-CM

## 2023-04-07 DIAGNOSIS — R600 Localized edema: Secondary | ICD-10-CM

## 2023-04-07 NOTE — Therapy (Signed)
OUTPATIENT PHYSICAL THERAPY TREATMENT   Patient Name: Mark Miranda MRN: [id number] DOB:30-Nov-1970, 52 y.o., male Today's Date: 04/07/2023  END OF SESSION:  PT End of Session - 04/07/23 0857     Visit Number 8    Number of Visits 25    Date for PT Re-Evaluation 06/03/23    Authorization Type Workers Comp, Managed MCD    PT Start Time [phone]    PT Stop Time 0930    PT Time Calculation (min) 38 min    Activity Tolerance Patient tolerated treatment well    Behavior During Therapy [facility] for tasks assessed/performed                  Past Medical History:  Diagnosis Date   BK viremia    Closed fracture of right elbow 03/04/2023   Depression    Diabetic peripheral neuropathy (HCC) 11/24/2015   End stage renal disease (HCC)    Erectile dysfunction    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   Hypercholesterolemia    Hypertension    Metabolic acidosis    Migraines    Recurrent peritonitis (HCC)    Type 1 diabetes with renal manifestations, controlled (HCC)    Vitamin D deficiency    Past Surgical History:  Procedure Laterality Date   AV FISTULA PLACEMENT  01/10/2012   Procedure: ARTERIOVENOUS (AV) FISTULA CREATION;  Surgeon: Sherren Kerns, MD;  Location: [facility] OR;  Service: Vascular;  Laterality: Left;  Left Brachial Cephalic Arteriovenous Fistula   CAPD INSERTION  01/09/2012   Procedure: CONTINUOUS AMBULATORY PERITONEAL DIALYSIS  (CAPD) CATHETER INSERTION;  Surgeon: Ernestene Mention, MD;  Location: [facility] OR;  Service: General;  Laterality: N/A;  Exteriorization PD Cath   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   received left kidney, 3 renal arteries, 1 renal vein, ureter and pancreas from 52 year old   INSERTION OF DIALYSIS CATHETER  01/13/2012   Procedure: INSERTION OF DIALYSIS CATHETER;  Surgeon: Larina Earthly, MD;  Location: [facility] OR;  Service: Vascular;  Laterality: Right;  Internal Jugular   LAPAROSCOPY  03/13/2012    Procedure: LAPAROSCOPY DIAGNOSTIC;  Surgeon: Ardeth Sportsman, MD;  Location: [facility] OR;  Service: General;  Laterality: N/A;   LIVER BIOPSY  04/02/2013   open   ORIF HUMERUS FRACTURE Right 03/07/2023   Procedure: OPEN REDUCTION INTERNAL FIXATION (ORIF) DISTAL HUMERUS FRACTURE;  Surgeon: Huel Cote, MD;  Location: [facility] ORS;  Service: Orthopedics;  Laterality: Right;   PERITONEAL CATHETER INSERTION     PERITONEAL CATHETER REMOVAL     SMALL INTESTINE SURGERY  04/02/2013   TRICEPS TENDON REPAIR  03/07/2023   Procedure: TRICEPS TENDON REPAIR;  Surgeon: Huel Cote, MD;  Location: [facility] ORS;  Service: Orthopedics;;   ULNAR NERVE TRANSPOSITION Right 03/07/2023   Procedure: ULNAR NERVE EXPLORATION;  Surgeon: Huel Cote, MD;  Location: [facility] ORS;  Service: Orthopedics;  Laterality: Right;   Patient Active Problem List   Diagnosis Date Noted   Closed fracture of right distal humerus 03/07/2023   Closed bicondylar fracture of distal end of right humerus 03/04/2023   Type 2 diabetes mellitus with diabetic chronic kidney disease (HCC) 07/22/2022   Depression, major, 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/2022   Erectile dysfunction 07/24/2017   Vitamin D deficiency, unspecified 07/24/2017   Low back pain 02/25/2016   Chronic fatigue 01/25/2016   Diabetic peripheral neuropathy (HCC) 11/24/2015   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   Depression 11/02/2015  Tobacco abuse 11/02/2015   End stage renal disease (HCC) 12/29/2011   HTN (hypertension) 01/25/2011    REFERRING PROVIDER: Steward Drone, MD  REFERRING DIAG: S42.491A (ICD-10-CM) - Other closed displaced fracture of distal end of right humerus, initial encounter  Post op distal humeral ORIF, ulnar nerve neuroplasty, triceps tendon repair Rationale for Evaluation and Treatment: Rehabilitation  THERAPY DIAG:  Pain in right elbow  Stiffness of right elbow, not elsewhere classified  Muscle weakness  (generalized)  Localized edema  ONSET DATE: DOS 03/07/23   SUBJECTIVE:                                                                                                                                                                                           SUBJECTIVE STATEMENT:  Pt saw PA this week who prescribed antibiotics and expressed area of suspected suture abscess.      PERTINENT HISTORY:  Vit D deficiency, h/o ESRD & kidney, pancreas transplant  PAIN:  Are you having pain? Yes: NPRS scale: 1-2/10 Pain location: Rt elbow Pain description: ache Aggravating factors: movement Relieving factors: pain meds  PRECAUTIONS:  None  RED FLAGS: None   WEIGHT BEARING RESTRICTIONS:  Yes NWB  FALLS:  Has patient fallen in last 6 months? Yes. 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  PATIENT GOALS:  Painting- steady hand, back to normal   OBJECTIVE:   HAND DOMINANCE:  Right   UE ROM UPPER EXTREMITY ROM:   Active ROM Right eval Right 8/30   Shoulder flexion 120    Shoulder extension      Shoulder abduction      Shoulder adduction      Shoulder extension      Shoulder internal rotation      Shoulder external rotation      Elbow flexion 80 96   Elbow extension -30 -29   Wrist flexion      Wrist extension      Wrist ulnar deviation      Wrist radial deviation      Wrist pronation      Wrist supination       (Blank rows = not tested)    TREATMENT:  Observation:  scabbing over superior margin of incision. No steri strips present over olecranon at this time. Overall appears to be healing well.   PROM- elbow per tissue tolerance w/n protocol ranges, shoulder flex to 90deg, wrist flex/ext, hand (all within pain limitations) in supine  Gentle passive stretch of wrist  extensors/flexors   Pt performed wrist flexion and extension AROM in neutral with UE supported.  Seated scap retraction 5" x20  STM to forearm  PATIENT EDUCATION:  Education details: Anatomy of condition, POC, HEP, exercise form/rationale, protocol restrictions.  Decreasing frequency to 1x/wk.  PT explained to pt and pt verbalized agreement.   Person educated: Patient Education method: Explanation, Demonstration, Tactile cues, Verbal cues, and Handouts Education comprehension: verbalized understanding, returned demonstration, verbal cues required, tactile cues required, and needs further education  HOME EXERCISE PROGRAM: [id number]  Ice 3-5/day 10-15 min with hand elevated Keep shoulder moving, upright posture   ASSESSMENT:  CLINICAL IMPRESSION:  Pt is now 4 weeks and 3 days post op. Continued to work on gentle ROM within protocol limitations. He was measured at -29 elbow extension and 96deg elbow flexion, an improvement from IE. Careful to remain within pain limits with exercises. Area on scabbing did have very light discharge by end of session, so place bandage over scab to protect wound. Instructed pt to replace bandage before going to sleep to avoid removing scab in sleep. Will assess area next visit.   OBJECTIVE IMPAIRMENTS: decreased activity tolerance, decreased ROM, decreased strength, increased edema, impaired flexibility, impaired UE functional use, improper body mechanics, postural dysfunction, and pain.   ACTIVITY LIMITATIONS: carrying, lifting, sleeping, bathing, dressing, and reach over head  PARTICIPATION LIMITATIONS: meal prep, cleaning, laundry, shopping, community activity, occupation, and yard work  PERSONAL FACTORS:  see PMH  are also affecting patient's functional outcome.   REHAB POTENTIAL: Good  CLINICAL DECISION MAKING: Stable/uncomplicated  EVALUATION COMPLEXITY: Low   GOALS: Goals reviewed with patient? No  SHORT TERM GOALS: Target date:  8/24  Elbow extension to  neutral Baseline: see obj Goal status: INITIAL  2.  Able to make full fist Baseline: unable due to edema at eval Goal status: INITIAL    LONG TERM GOALS: Target date: POC date  Strength 80% of opp UE via hand held dynamometry testing Baseline: not appropriate to test at eval Goal status: INITIAL  2.  Necessary ROM avail for all ADLs Baseline: very limited at eval Goal status: INITIAL  3.  UEFI to increase to at least 70/80 Baseline: see obj Goal status: INITIAL  4.  Able to perform long arc UE motions with weight without incr pain Baseline: will be necessary for painting.  Goal status: INITIAL     PLAN:  PT FREQUENCY: 1-2x/week  PT DURATION: 12 weeks  PLANNED INTERVENTIONS: Therapeutic exercises, Therapeutic activity, Neuromuscular re-education, Patient/Family education, Self Care, Joint mobilization, Aquatic Therapy, Dry Needling, Electrical stimulation, Spinal mobilization, Cryotherapy, Moist heat, scar mobilization, Taping, Vasopneumatic device, Ultrasound, Ionotophoresis 4mg /ml Dexamethasone, Manual therapy, and Re-evaluation.  PLAN FOR NEXT SESSION:  Cont with ROM and ther ex per protocol.  Decrease frequency to 1x/wk.   Riki Altes, PTA  04/07/23 9:51 AM

## 2023-04-12 ENCOUNTER — Encounter (HOSPITAL_BASED_OUTPATIENT_CLINIC_OR_DEPARTMENT_OTHER): Payer: Self-pay

## 2023-04-12 ENCOUNTER — Ambulatory Visit (HOSPITAL_BASED_OUTPATIENT_CLINIC_OR_DEPARTMENT_OTHER): Payer: Worker's Compensation | Attending: Orthopaedic Surgery

## 2023-04-12 DIAGNOSIS — X58XXXA Exposure to other specified factors, initial encounter: Secondary | ICD-10-CM | POA: Insufficient documentation

## 2023-04-12 DIAGNOSIS — M6281 Muscle weakness (generalized): Secondary | ICD-10-CM | POA: Insufficient documentation

## 2023-04-12 DIAGNOSIS — M25521 Pain in right elbow: Secondary | ICD-10-CM | POA: Diagnosis not present

## 2023-04-12 DIAGNOSIS — M25621 Stiffness of right elbow, not elsewhere classified: Secondary | ICD-10-CM

## 2023-04-12 DIAGNOSIS — R600 Localized edema: Secondary | ICD-10-CM | POA: Diagnosis not present

## 2023-04-12 DIAGNOSIS — S42491A Other displaced fracture of lower end of right humerus, initial encounter for closed fracture: Secondary | ICD-10-CM | POA: Insufficient documentation

## 2023-04-12 NOTE — Therapy (Signed)
OUTPATIENT PHYSICAL THERAPY TREATMENT   Patient Name: Mark Miranda MRN: [id number] DOB:1971-02-23, 52 y.o., male Today's Date: 04/12/2023  END OF SESSION:  PT End of Session - 04/12/23 0926     Visit Number 9    Number of Visits 25    Date for PT Re-Evaluation 06/03/23    Authorization Type Workers Comp, Managed MCD    PT Start Time [phone]    PT Stop Time 1015    PT Time Calculation (min) 41 min    Activity Tolerance Patient tolerated treatment well    Behavior During Therapy [facility] for tasks assessed/performed                  Past Medical History:  Diagnosis Date   BK viremia    Closed fracture of right elbow 03/04/2023   Depression    Diabetic peripheral neuropathy (HCC) 11/24/2015   End stage renal disease (HCC)    Erectile dysfunction    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   Hypercholesterolemia    Hypertension    Metabolic acidosis    Migraines    Recurrent peritonitis (HCC)    Type 1 diabetes with renal manifestations, controlled (HCC)    Vitamin D deficiency    Past Surgical History:  Procedure Laterality Date   AV FISTULA PLACEMENT  01/10/2012   Procedure: ARTERIOVENOUS (AV) FISTULA CREATION;  Surgeon: Sherren Kerns, MD;  Location: [facility] OR;  Service: Vascular;  Laterality: Left;  Left Brachial Cephalic Arteriovenous Fistula   CAPD INSERTION  01/09/2012   Procedure: CONTINUOUS AMBULATORY PERITONEAL DIALYSIS  (CAPD) CATHETER INSERTION;  Surgeon: Ernestene Mention, MD;  Location: [facility] OR;  Service: General;  Laterality: N/A;  Exteriorization PD Cath   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   received left kidney, 3 renal arteries, 1 renal vein, ureter and pancreas from 52 year old   INSERTION OF DIALYSIS CATHETER  01/13/2012   Procedure: INSERTION OF DIALYSIS CATHETER;  Surgeon: Larina Earthly, MD;  Location: [facility] OR;  Service: Vascular;  Laterality: Right;  Internal Jugular   LAPAROSCOPY  03/13/2012   Procedure:  LAPAROSCOPY DIAGNOSTIC;  Surgeon: Ardeth Sportsman, MD;  Location: [facility] OR;  Service: General;  Laterality: N/A;   LIVER BIOPSY  04/02/2013   open   ORIF HUMERUS FRACTURE Right 03/07/2023   Procedure: OPEN REDUCTION INTERNAL FIXATION (ORIF) DISTAL HUMERUS FRACTURE;  Surgeon: Huel Cote, MD;  Location: [facility] ORS;  Service: Orthopedics;  Laterality: Right;   PERITONEAL CATHETER INSERTION     PERITONEAL CATHETER REMOVAL     SMALL INTESTINE SURGERY  04/02/2013   TRICEPS TENDON REPAIR  03/07/2023   Procedure: TRICEPS TENDON REPAIR;  Surgeon: Huel Cote, MD;  Location: [facility] ORS;  Service: Orthopedics;;   ULNAR NERVE TRANSPOSITION Right 03/07/2023   Procedure: ULNAR NERVE EXPLORATION;  Surgeon: Huel Cote, MD;  Location: [facility] ORS;  Service: Orthopedics;  Laterality: Right;   Patient Active Problem List   Diagnosis Date Noted   Closed fracture of right distal humerus 03/07/2023   Closed bicondylar fracture of distal end of right humerus 03/04/2023   Type 2 diabetes mellitus with diabetic chronic kidney disease (HCC) 07/22/2022   Depression, major, 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/2022   Erectile dysfunction 07/24/2017   Vitamin D deficiency, unspecified 07/24/2017   Low back pain 02/25/2016   Chronic fatigue 01/25/2016   Diabetic peripheral neuropathy (HCC) 11/24/2015   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   Depression 11/02/2015  Tobacco abuse 11/02/2015   End stage renal disease (HCC) 12/29/2011   HTN (hypertension) 01/25/2011    REFERRING PROVIDER: Steward Drone, MD  REFERRING DIAG: [phone] (ICD-10-CM) - Other closed displaced fracture of distal end of right humerus, initial encounter  Post op distal humeral ORIF, ulnar nerve neuroplasty, triceps tendon repair Rationale for Evaluation and Treatment: Rehabilitation  THERAPY DIAG:  Stiffness of right elbow, not elsewhere classified  Pain in right elbow  Localized edema  Muscle weakness  (generalized)  ONSET DATE: DOS 03/07/23   SUBJECTIVE:                                                                                                                                                                                           SUBJECTIVE STATEMENT:  Pt reports he just started taking his antibiotics yesterday. He had some increased discomfort [place]      PERTINENT HISTORY:  Vit D deficiency, h/o ESRD & kidney, pancreas transplant  PAIN:  Are you having pain? Yes: NPRS scale: 1-2/10 Pain location: Rt elbow Pain description: ache Aggravating factors: movement Relieving factors: pain meds  PRECAUTIONS:  None  RED FLAGS: None   WEIGHT BEARING RESTRICTIONS:  Yes NWB  FALLS:  Has patient fallen in last 6 months? Yes. 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  PATIENT GOALS:  Painting- steady hand, back to normal   OBJECTIVE:   HAND DOMINANCE:  Right   UE ROM UPPER EXTREMITY ROM:   Active ROM Right eval Right 8/30   Shoulder flexion 120    Shoulder extension      Shoulder abduction      Shoulder adduction      Shoulder extension      Shoulder internal rotation      Shoulder external rotation      Elbow flexion 80 96   Elbow extension -30 -29   Wrist flexion      Wrist extension      Wrist ulnar deviation      Wrist radial deviation      Wrist pronation      Wrist supination       (Blank rows = not tested)    TREATMENT:  Observation:  scabbing over olecranon. Small healing wound over superior margin. Mild redness. Overall appears to be healing well.   PROM- elbow per tissue tolerance w/n protocol ranges, shoulder flex to 90deg, wrist flex/ext, hand (all within pain limitations) in supine Supine wand flexion 2x10 Pulleys  flex/abd Submax shoulder iso-flexion and abd 5" x10ea   Seated scap retraction 5" x20 PATIENT EDUCATION:  Education details: Anatomy of condition, POC, HEP, exercise form/rationale, protocol restrictions.  Decreasing frequency to 1x/wk.  PT explained to pt and pt verbalized agreement.   Person educated: Patient Education method: Explanation, Demonstration, Tactile cues, Verbal cues, and Handouts Education comprehension: verbalized understanding, returned demonstration, verbal cues required, tactile cues required, and needs further education  HOME EXERCISE PROGRAM: [id number]  Ice 3-5/day 10-15 min with hand elevated Keep shoulder moving, upright posture   ASSESSMENT:  CLINICAL IMPRESSION:  Pt is now 5 weeks and 1 days s/p. Instructed pt to take antibiotics as prescribed. PTA placed steristrip over affected area for protection during exercises. Will continue to monitor the area each session. Overall appears to be healing. Initiated shoulder AAROM today per protocol as well as shoulder isometrics. Cues provided for pt to remain within pain limitations. Pt questions when he can begin light duty work. Will defer to MD for this. Will continue to progress as tolerated.   OBJECTIVE IMPAIRMENTS: decreased activity tolerance, decreased ROM, decreased strength, increased edema, impaired flexibility, impaired UE functional use, improper body mechanics, postural dysfunction, and pain.   ACTIVITY LIMITATIONS: carrying, lifting, sleeping, bathing, dressing, and reach over head  PARTICIPATION LIMITATIONS: meal prep, cleaning, laundry, shopping, community activity, occupation, and yard work  PERSONAL FACTORS:  see PMH  are also affecting patient's functional outcome.   REHAB POTENTIAL: Good  CLINICAL DECISION MAKING: Stable/uncomplicated  EVALUATION COMPLEXITY: Low   GOALS: Goals reviewed with patient? No  SHORT TERM GOALS: Target date: 8/24  Elbow extension to  neutral Baseline: see  obj Goal status: INITIAL  2.  Able to make full fist Baseline: unable due to edema at eval Goal status: INITIAL    LONG TERM GOALS: Target date: POC date  Strength 80% of opp UE via hand held dynamometry testing Baseline: not appropriate to test at eval Goal status: INITIAL  2.  Necessary ROM avail for all ADLs Baseline: very limited at eval Goal status: INITIAL  3.  UEFI to increase to at least 70/80 Baseline: see obj Goal status: INITIAL  4.  Able to perform long arc UE motions with weight without incr pain Baseline: will be necessary for painting.  Goal status: INITIAL     PLAN:  PT FREQUENCY: 1-2x/week  PT DURATION: 12 weeks  PLANNED INTERVENTIONS: Therapeutic exercises, Therapeutic activity, Neuromuscular re-education, Patient/Family education, Self Care, Joint mobilization, Aquatic Therapy, Dry Needling, Electrical stimulation, Spinal mobilization, Cryotherapy, Moist heat, scar mobilization, Taping, Vasopneumatic device, Ultrasound, Ionotophoresis 4mg /ml Dexamethasone, Manual therapy, and Re-evaluation.  PLAN FOR NEXT SESSION:  Cont with ROM and ther ex per protocol.  Decrease frequency to 1x/wk.   Riki Altes, PTA  04/12/23 10:24 AM

## 2023-04-14 ENCOUNTER — Ambulatory Visit (HOSPITAL_BASED_OUTPATIENT_CLINIC_OR_DEPARTMENT_OTHER): Payer: Worker's Compensation | Admitting: Physical Therapy

## 2023-04-14 ENCOUNTER — Encounter (HOSPITAL_BASED_OUTPATIENT_CLINIC_OR_DEPARTMENT_OTHER): Payer: Self-pay | Admitting: Physical Therapy

## 2023-04-14 DIAGNOSIS — M25621 Stiffness of right elbow, not elsewhere classified: Secondary | ICD-10-CM

## 2023-04-14 DIAGNOSIS — M25521 Pain in right elbow: Secondary | ICD-10-CM

## 2023-04-14 DIAGNOSIS — M6281 Muscle weakness (generalized): Secondary | ICD-10-CM

## 2023-04-14 DIAGNOSIS — S42491A Other displaced fracture of lower end of right humerus, initial encounter for closed fracture: Secondary | ICD-10-CM | POA: Diagnosis not present

## 2023-04-14 NOTE — Therapy (Signed)
OUTPATIENT PHYSICAL THERAPY TREATMENT / PROGRESS NOTE   Patient Name: Mark Miranda MRN: [id number] DOB:September 03, 1970, 52 y.o., male Today's Date: 04/14/2023  END OF SESSION:  PT End of Session - 04/14/23 0945     Visit Number 10    Number of Visits 25    Date for PT Re-Evaluation 06/03/23    Authorization Type Workers Comp, Managed MCD    PT Start Time [phone]    PT Stop Time 1018    PT Time Calculation (min) 35 min    Activity Tolerance Patient tolerated treatment well    Behavior During Therapy WFL for tasks assessed/performed                   Past Medical History:  Diagnosis Date   BK viremia    Closed fracture of right elbow 03/04/2023   Depression    Diabetic peripheral neuropathy (HCC) 11/24/2015   End stage renal disease (HCC)    Erectile dysfunction    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   Hypercholesterolemia    Hypertension    Metabolic acidosis    Migraines    Recurrent peritonitis (HCC)    Type 1 diabetes with renal manifestations, controlled (HCC)    Vitamin D deficiency    Past Surgical History:  Procedure Laterality Date   AV FISTULA PLACEMENT  01/10/2012   Procedure: ARTERIOVENOUS (AV) FISTULA CREATION;  Surgeon: Sherren Kerns, MD;  Location[facility] OR;  Service: Vascular;  Laterality: Left;  Left Brachial Cephalic Arteriovenous Fistula   CAPD INSERTION  01/09/2012   Procedure: CONTINUOUS AMBULATORY PERITONEAL DIALYSIS  (CAPD) CATHETER INSERTION;  Surgeon: Ernestene Mention, MD;  Location: [facility] OR;  Service: General;  Laterality: N/A;  Exteriorization PD Cath   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   received left kidney, 3 renal arteries, 1 renal vein, ureter and pancreas from 52 year old   INSERTION OF DIALYSIS CATHETER  01/13/2012   Procedure: INSERTION OF DIALYSIS CATHETER;  Surgeon: Larina Earthly, MD;  Location: [facility] OR;  Service: Vascular;  Laterality: Right;  Internal Jugular   LAPAROSCOPY   03/13/2012   Procedure: LAPAROSCOPY DIAGNOSTIC;  Surgeon: Ardeth Sportsman, MD;  Location: [facility] OR;  Service: General;  Laterality: N/A;   LIVER BIOPSY  04/02/2013   open   ORIF HUMERUS FRACTURE Right 03/07/2023   Procedure: OPEN REDUCTION INTERNAL FIXATION (ORIF) DISTAL HUMERUS FRACTURE;  Surgeon: Huel Cote, MD;  Location: [facility] ORS;  Service: Orthopedics;  Laterality: Right;   PERITONEAL CATHETER INSERTION     PERITONEAL CATHETER REMOVAL     SMALL INTESTINE SURGERY  04/02/2013   TRICEPS TENDON REPAIR  03/07/2023   Procedure: TRICEPS TENDON REPAIR;  Surgeon: Huel Cote, MD;  Location: [facility] ORS;  Service: Orthopedics;;   ULNAR NERVE TRANSPOSITION Right 03/07/2023   Procedure: ULNAR NERVE EXPLORATION;  Surgeon: Huel Cote, MD;  Location: [facility] ORS;  Service: Orthopedics;  Laterality: Right;   Patient Active Problem List   Diagnosis Date Noted   Closed fracture of right distal humerus 03/07/2023   Closed bicondylar fracture of distal end of right humerus 03/04/2023   Type 2 diabetes mellitus with diabetic chronic kidney disease (HCC) 07/22/2022   Depression, major, 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/2022   Erectile dysfunction 07/24/2017   Vitamin D deficiency, unspecified 07/24/2017   Low back pain 02/25/2016   Chronic fatigue 01/25/2016   Diabetic peripheral neuropathy (HCC) 11/24/2015   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  Depression 11/02/2015   Tobacco abuse 11/02/2015   End stage renal disease (HCC) 12/29/2011   HTN (hypertension) 01/25/2011    REFERRING PROVIDER: Steward Drone, MD  REFERRING DIAG: S42.491A (ICD-10-CM) - Other closed displaced fracture of distal end of right humerus, initial encounter  Post op distal humeral ORIF, ulnar nerve neuroplasty, triceps tendon repair Rationale for Evaluation and Treatment: Rehabilitation  THERAPY DIAG:  Stiffness of right elbow, not elsewhere classified  Pain in right elbow  Muscle weakness  (generalized)  ONSET DATE: DOS 03/07/23   SUBJECTIVE:                                                                                                                                                                                           SUBJECTIVE STATEMENT:  Pt is 5 weeks and 3 days post op.  Pt states he doesn't have anymore pain meds and hasn't had pain meds in 2-3 weeks.  He states he still has pain.  He has been taking tylenol and Ibuprofen some though doesn't help.  Pt is taking the antibiotics for his incision.  Pt states his elbow gets irritated with movement.  Pt had some pain after prior Rx.      PERTINENT HISTORY:  Vit D deficiency, h/o ESRD & kidney, pancreas transplant  PAIN:  NPRS:  2-3/10 current, 5/10 worst, 2/10 best Location:  post elbow  PRECAUTIONS:  None  RED FLAGS: None   WEIGHT BEARING RESTRICTIONS:  Yes NWB  FALLS:  Has patient fallen in last 6 months? Yes. 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  PATIENT GOALS:  Painting- steady hand, back to normal   OBJECTIVE:   HAND DOMINANCE:  Right  TODAY'S TREATMENT:     UPPER EXTREMITY ROM:   Active ROM Right eval Right 8/30  Right 9/6 (PROM)  Shoulder flexion 120     Shoulder extension       Shoulder abduction       Shoulder adduction       Shoulder extension       Shoulder internal rotation       Shoulder external rotation       Elbow flexion 80 96  98  Elbow extension -30 -29  -33  Wrist flexion       Wrist extension       Wrist ulnar deviation       Wrist radial deviation       Wrist pronation       Wrist supination        (Blank rows = not tested)   UEFI:  8/80                                                                                                                                Observation:  overall incision is healing.  He has one area on the incision that was not completely closed.  This area has a steri  strip over it and seems to be healing.  No redness noted.  He has scabbing over olecranon.   PROM- elbow per tissue tolerance w/n protocol ranges, shoulder flex per pt and tissue tolerance w/n protocol range in supine Pulleys flex    PATIENT EDUCATION:  Education details: Anatomy of condition, POC, HEP, exercise form/rationale, objective findings, protocol restrictions.  PT answered pt's questions. Person educated: Patient Education method: Explanation, Demonstration, Tactile cues, Verbal cues, and Handouts Education comprehension: verbalized understanding, returned demonstration, verbal cues required, tactile cues required, and needs further education  HOME EXERCISE PROGRAM: [id number]   ASSESSMENT:  CLINICAL IMPRESSION:  Pt has begun taking his antibiotics and his incision is improving.  Pt tolerates PROM well.  He is progressing appropriately with elbow flexion PROM per protocol though has stiffness and limitations with elbow extension PROM.  Pt is progressing with exercises per protocol.  Pt performed pulleys in shoulder flexion per protocol and was instructed in appropriate range and to not perform into a painful or pulling range.  Pt responded well to Rx and should benefit from cont skilled PT services per protocol to improve ROM and to restore desired level of function.   OBJECTIVE IMPAIRMENTS: decreased activity tolerance, decreased ROM, decreased strength, increased edema, impaired flexibility, impaired UE functional use, improper body mechanics, postural dysfunction, and pain.   ACTIVITY LIMITATIONS: carrying, lifting, sleeping, bathing, dressing, and reach over head  PARTICIPATION LIMITATIONS: meal prep, cleaning, laundry, shopping, community activity, occupation, and yard work  PERSONAL FACTORS:  see PMH  are also affecting patient's functional outcome.   REHAB POTENTIAL: Good  CLINICAL DECISION MAKING: Stable/uncomplicated  EVALUATION COMPLEXITY:  Low   GOALS: Goals reviewed with patient? No  SHORT TERM GOALS: Target date: 8/24  Elbow extension to  neutral Baseline: see obj Goal status: ONGOING  2.  Able to make full fist Baseline: unable due to edema at eval Goal status:  INITIAL    LONG TERM GOALS: Target date: POC date  Strength 80% of opp UE via hand held dynamometry testing Baseline: not appropriate to test at eval Goal status: INITIAL  2.  Necessary ROM avail for all ADLs Baseline: very limited at eval Goal status: INITIAL  3.  UEFI to increase to at least 70/80 Baseline: see obj Goal status: ONGOING  4.  Able to perform long arc UE motions with weight without incr pain Baseline: will be necessary for painting.  Goal status: INITIAL     PLAN:  PT FREQUENCY: 2x/wk  PT DURATION: 7 weeks  PLANNED INTERVENTIONS: Therapeutic exercises, Therapeutic activity, Neuromuscular re-education, Patient/Family education, Self Care,  Joint mobilization, Aquatic Therapy, Dry Needling, Electrical stimulation, Spinal mobilization, Cryotherapy, Moist heat, scar mobilization, Taping, Vasopneumatic device, Ultrasound, Ionotophoresis 4mg /ml Dexamethasone, Manual therapy, and Re-evaluation.  PLAN FOR NEXT SESSION:  Cont with ROM and ther ex per protocol.     Audie Clear III PT, DPT 04/14/23 10:22 PM

## 2023-04-18 ENCOUNTER — Encounter (HOSPITAL_BASED_OUTPATIENT_CLINIC_OR_DEPARTMENT_OTHER): Payer: Medicaid Other | Admitting: Physical Therapy

## 2023-04-21 ENCOUNTER — Ambulatory Visit (HOSPITAL_BASED_OUTPATIENT_CLINIC_OR_DEPARTMENT_OTHER): Payer: Worker's Compensation

## 2023-04-21 ENCOUNTER — Ambulatory Visit (INDEPENDENT_AMBULATORY_CARE_PROVIDER_SITE_OTHER): Payer: Worker's Compensation | Admitting: Orthopaedic Surgery

## 2023-04-21 ENCOUNTER — Ambulatory Visit (INDEPENDENT_AMBULATORY_CARE_PROVIDER_SITE_OTHER): Payer: Worker's Compensation

## 2023-04-21 DIAGNOSIS — S42491A Other displaced fracture of lower end of right humerus, initial encounter for closed fracture: Secondary | ICD-10-CM

## 2023-04-21 NOTE — Progress Notes (Signed)
Post Operative Evaluation    Procedure/Date of Surgery: Status post right distal humerus open reduction internal fixation 7/30  Interval History:   Presents today for follow-up of his right elbow.  He has still having some stiffness although is working through this with physical therapy.  Denies any pain in the elbow.  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   Hypercholesterolemia    Hypertension    Metabolic acidosis    Migraines    Recurrent peritonitis (HCC)    Type 1 diabetes with renal manifestations, controlled (HCC)    Vitamin D deficiency    Past Surgical History:  Procedure Laterality Date   AV FISTULA PLACEMENT  01/10/2012   Procedure: ARTERIOVENOUS (AV) FISTULA CREATION;  Surgeon: Sherren Kerns, MD;  Location: [facility] OR;  Service: Vascular;  Laterality: Left;  Left Brachial Cephalic Arteriovenous Fistula   CAPD INSERTION  01/09/2012   Procedure: CONTINUOUS AMBULATORY PERITONEAL DIALYSIS  (CAPD) CATHETER INSERTION;  Surgeon: Ernestene Mention, MD;  Location: [facility] OR;  Service: General;  Laterality: N/A;  Exteriorization PD Cath   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   received left kidney, 3 renal arteries, 1 renal vein, ureter and pancreas from 52 year old   INSERTION OF DIALYSIS CATHETER  01/13/2012   Procedure: INSERTION OF DIALYSIS CATHETER;  Surgeon: Larina Earthly, MD;  Location: [facility] OR;  Service: Vascular;  Laterality: Right;  Internal Jugular   LAPAROSCOPY  03/13/2012   Procedure: LAPAROSCOPY DIAGNOSTIC;  Surgeon: Ardeth Sportsman, MD;  Location: [facility] OR;  Service: General;  Laterality: N/A;   LIVER BIOPSY   04/02/2013   open   ORIF HUMERUS FRACTURE Right 03/07/2023   Procedure: OPEN REDUCTION INTERNAL FIXATION (ORIF) DISTAL HUMERUS FRACTURE;  Surgeon: Huel Cote, MD;  Location: [facility] ORS;  Service: Orthopedics;  Laterality: Right;   PERITONEAL CATHETER INSERTION     PERITONEAL CATHETER REMOVAL     SMALL INTESTINE SURGERY  04/02/2013   TRICEPS TENDON REPAIR  03/07/2023   Procedure: TRICEPS TENDON REPAIR;  Surgeon: Huel Cote, MD;  Location: [facility] ORS;  Service: Orthopedics;;   ULNAR NERVE TRANSPOSITION Right 03/07/2023   Procedure: ULNAR NERVE EXPLORATION;  Surgeon: Huel Cote, MD;  Location: [facility] ORS;  Service: Orthopedics;  Laterality: Right;   Social History   Socioeconomic History   Marital status: Married    Spouse name: Byrd Hesselbach   Number of children: 4   Years of education: 10   Highest education level: Not on file  Occupational History   Occupation: Education administrator  Tobacco Use   Smoking status: Every Day    Current packs/day: 1.00    Average packs/day: 1 pack/day for 22.0 years (22.0 ttl pk-yrs)    Types: Cigarettes   Smokeless tobacco: Never  Vaping Use   Vaping status: Every Day   Substances: Nicotine  Substance and Sexual Activity   Alcohol use: Yes    Alcohol/week: 0.0 standard drinks of alcohol    Comment: socially   Drug use: No   Sexual activity:  Yes  Other Topics Concern   Not on file  Social History Narrative   Paints houses   Wife works at a Retail banker at [employer]   4 children age 74 to 47   Regular exercise-no current exercise regimen   Caffeine Use-no   Social Determinants of Health   Financial Resource Strain: Not on file  Food Insecurity: Not on file  Transportation Needs: Not on file  Physical Activity: Not on file  Stress: Not on file  Social Connections: Not on file   Family History  Problem Relation Age of Onset   Diabetes Mother    Hypertension Mother    Hyperlipidemia Mother    Cancer Father        glioblastoma   No Known  Allergies Current Outpatient Medications  Medication Sig Dispense Refill   aspirin EC 325 MG tablet Take 1 tablet (325 mg total) by mouth daily. 14 tablet 0   dapagliflozin propanediol (FARXIGA) 10 MG TABS tablet TAKE 1 TABLET BY MOUTH DAILY BEFORE BREAKFAST. 90 tablet 0   HYDROcodone-acetaminophen (NORCO/VICODIN) 5-325 MG tablet Take 1 tablet by mouth every 6 (six) hours as needed for moderate pain. 20 tablet 0   lisinopril (ZESTRIL) 10 MG tablet Take 1 tablet (10 mg total) by mouth daily. 90 tablet 0   mycophenolate (MYFORTIC) 180 MG EC tablet Take 4 tablets by mouth 2 (two) times daily.     oxyCODONE (ROXICODONE) 5 MG immediate release tablet Take 1 tablet (5 mg total) by mouth every 4 (four) hours as needed for severe pain or breakthrough pain. 20 tablet 0   oxyCODONE (ROXICODONE) 5 MG immediate release tablet Take 1 tablet (5 mg total) by mouth every 4 (four) hours as needed for severe pain or breakthrough pain. 20 tablet 0   sildenafil (VIAGRA) 100 MG tablet Take 0.5-1 tablets (50-100 mg total) by mouth daily as needed for erectile dysfunction. 15 tablet 6   sitaGLIPtin (JANUVIA) 50 MG tablet Take 50 mg by mouth daily.     tacrolimus (PROGRAF) 1 MG capsule Take 5 mg by mouth 2 (two) times daily. Pt take 5 mg in the morning and 4 mg at night     No current facility-administered medications for this visit.   No results found.  Review of Systems:   A ROS was performed including pertinent positives and negatives as documented in the HPI.   Musculoskeletal Exam:    There were no vitals taken for this visit.  Right incision is well-appearing which is subsequently healed..  Range of motion is from 20 degrees to 120 degrees.  Sensation is intact to light touch throughout.  Fires ulnar interosseous nerve distribution as well as radial nerve with strong wrist extension and 2+ radial pulse  Imaging:    2 views right humerus: Status post open reduction internal fixation without evidence of  hardware complication  I personally reviewed and interpreted the radiographs.   Assessment:   6 weeks status post right distal humeral open reduction internal fixation overall doing well.  There is some interval healing at this time.  I would like him to begin working actively aggressively on range of motion.  I will plan to see him back in 6 weeks for reassessment.  We did discuss the possible need for lysis of adhesions and capsular release in the elbow if he is not able to improve with range of motion  Plan :    -Return to clinic in 6 weeks for reassessment and motion check  I personally saw and evaluated the patient, and participated in the management and treatment plan.  Huel Cote, MD Attending Physician, Orthopedic Surgery  This document was dictated using Dragon voice recognition software. A reasonable attempt at proof reading has been made to minimize errors.

## 2023-04-24 ENCOUNTER — Ambulatory Visit (HOSPITAL_BASED_OUTPATIENT_CLINIC_OR_DEPARTMENT_OTHER): Payer: Worker's Compensation | Attending: Orthopaedic Surgery

## 2023-04-24 ENCOUNTER — Encounter (HOSPITAL_BASED_OUTPATIENT_CLINIC_OR_DEPARTMENT_OTHER): Payer: Self-pay

## 2023-04-24 DIAGNOSIS — M25521 Pain in right elbow: Secondary | ICD-10-CM | POA: Insufficient documentation

## 2023-04-24 DIAGNOSIS — M25621 Stiffness of right elbow, not elsewhere classified: Secondary | ICD-10-CM | POA: Diagnosis present

## 2023-04-24 DIAGNOSIS — R600 Localized edema: Secondary | ICD-10-CM | POA: Insufficient documentation

## 2023-04-24 DIAGNOSIS — M6281 Muscle weakness (generalized): Secondary | ICD-10-CM | POA: Diagnosis present

## 2023-04-24 NOTE — Therapy (Signed)
OUTPATIENT PHYSICAL THERAPY TREATMENT    Patient Name: Mark Miranda MRN: [id number] DOB:Nov 02, 1970, 52 y.o., male Today's Date: 04/24/2023  END OF SESSION:  PT End of Session - 04/24/23 1420     Visit Number 11    Number of Visits 25    Date for PT Re-Evaluation 06/03/23    Authorization Type Workers Comp, Managed MCD    PT Start Time 1347    PT Stop Time 1431    PT Time Calculation (min) 44 min    Activity Tolerance Patient tolerated treatment well    Behavior During Therapy WFL for tasks assessed/performed                    Past Medical History:  Diagnosis Date   BK viremia    Closed fracture of right elbow 03/04/2023   Depression    Diabetic peripheral neuropathy (HCC) 11/24/2015   End stage renal disease (HCC)    Erectile dysfunction    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   Hypercholesterolemia    Hypertension    Metabolic acidosis    Migraines    Recurrent peritonitis (HCC)    Type 1 diabetes with renal manifestations, controlled (HCC)    Vitamin D deficiency    Past Surgical History:  Procedure Laterality Date   AV FISTULA PLACEMENT  01/10/2012   Procedure: ARTERIOVENOUS (AV) FISTULA CREATION;  Surgeon: Sherren Kerns, MD;  Location: [facility] OR;  Service: Vascular;  Laterality: Left;  Left Brachial Cephalic Arteriovenous Fistula   CAPD INSERTION  01/09/2012   Procedure: CONTINUOUS AMBULATORY PERITONEAL DIALYSIS  (CAPD) CATHETER INSERTION;  Surgeon: Ernestene Mention, MD;  Location: [facility] OR;  Service: General;  Laterality: N/A;  Exteriorization PD Cath   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   received left kidney, 3 renal arteries, 1 renal vein, ureter and pancreas from 52 year old   INSERTION OF DIALYSIS CATHETER  01/13/2012   Procedure: INSERTION OF DIALYSIS CATHETER;  Surgeon: Larina Earthly, MD;  Location: [facility] OR;  Service: Vascular;  Laterality: Right;  Internal Jugular   LAPAROSCOPY  03/13/2012    Procedure: LAPAROSCOPY DIAGNOSTIC;  Surgeon: Ardeth Sportsman, MD;  Location: [facility] OR;  Service: General;  Laterality: N/A;   LIVER BIOPSY  04/02/2013   open   ORIF HUMERUS FRACTURE Right 03/07/2023   Procedure: OPEN REDUCTION INTERNAL FIXATION (ORIF) DISTAL HUMERUS FRACTURE;  Surgeon: Huel Cote, MD;  Location: [facility] ORS;  Service: Orthopedics;  Laterality: Right;   PERITONEAL CATHETER INSERTION     PERITONEAL CATHETER REMOVAL     SMALL INTESTINE SURGERY  04/02/2013   TRICEPS TENDON REPAIR  03/07/2023   Procedure: TRICEPS TENDON REPAIR;  Surgeon: Huel Cote, MD;  Location: [facility] ORS;  Service: Orthopedics;;   ULNAR NERVE TRANSPOSITION Right 03/07/2023   Procedure: ULNAR NERVE EXPLORATION;  Surgeon: Huel Cote, MD;  Location: [facility] ORS;  Service: Orthopedics;  Laterality: Right;   Patient Active Problem List   Diagnosis Date Noted   Closed fracture of right distal humerus 03/07/2023   Closed bicondylar fracture of distal end of right humerus 03/04/2023   Type 2 diabetes mellitus with diabetic chronic kidney disease (HCC) 07/22/2022   Depression, major, 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/2022   Erectile dysfunction 07/24/2017   Vitamin D deficiency, unspecified 07/24/2017   Low back pain 02/25/2016   Chronic fatigue 01/25/2016   Diabetic peripheral neuropathy (HCC) 11/24/2015   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   Depression  11/02/2015   Tobacco abuse 11/02/2015   End stage renal disease (HCC) 12/29/2011   HTN (hypertension) 01/25/2011    REFERRING PROVIDER: Steward Drone, MD  REFERRING DIAG: [phone] (ICD-10-CM) - Other closed displaced fracture of distal end of right humerus, initial encounter  Post op distal humeral ORIF, ulnar nerve neuroplasty, triceps tendon repair Rationale for Evaluation and Treatment: Rehabilitation  THERAPY DIAG:  Localized edema  Pain in right elbow  Muscle weakness (generalized)  Stiffness of right elbow, not  elsewhere classified  ONSET DATE: DOS 03/07/23   SUBJECTIVE:                                                                                                                                                                                           SUBJECTIVE STATEMENT:  Pt will be 7 weeks s/p tomorrow. No pain at rest, but does have been when out and about.    PERTINENT HISTORY:  Vit D deficiency, h/o ESRD & kidney, pancreas transplant  PAIN:  NPRS:  0/10 current, 5/10 worst, 2/10 best Location:  post elbow  PRECAUTIONS:  None  RED FLAGS: None   WEIGHT BEARING RESTRICTIONS:  Yes NWB  FALLS:  Has patient fallen in last 6 months? Yes. 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  PATIENT GOALS:  Painting- steady hand, back to normal   OBJECTIVE:   HAND DOMINANCE:  Right      UPPER EXTREMITY ROM:   Active ROM Right eval Right 8/30  Right 9/6 (PROM)  Shoulder flexion 120     Shoulder extension       Shoulder abduction       Shoulder adduction       Shoulder extension       Shoulder internal rotation       Shoulder external rotation       Elbow flexion 80 96  98  Elbow extension -30 -29  -33  Wrist flexion       Wrist extension       Wrist ulnar deviation       Wrist radial deviation       Wrist pronation       Wrist supination        (Blank rows = not tested)   UEFI:  8/80  TODAY'S TREATMENT:   PROM for R elbow flexion/extension Supine cane flexion 2x10 Supine SA punch 1# 2x10 Supine ABC 1# x1 Pulleys flexion x53min Standing active shoulder flexion 2x10 Standing active abduction 2x10 Resisted IR/ER RTB 2x10eaR     PATIENT EDUCATION:  Education details: Anatomy of condition, POC, HEP, exercise form/rationale, objective findings, protocol restrictions.  PT answered  pt's questions. Person educated: Patient Education method: Explanation, Demonstration, Tactile cues, Verbal cues, and Handouts Education comprehension: verbalized understanding, returned demonstration, verbal cues required, tactile cues required, and needs further education  HOME EXERCISE PROGRAM: [id number]   ASSESSMENT:  CLINICAL IMPRESSION:  Incision continues to drain mildly per pt report. Localized swelling over the affected area. Will monitor. PROM remains stiff at end ranges, spent time on PROM to address. Mild non painful cavitation felt with passive elbow flexion.   OBJECTIVE IMPAIRMENTS: decreased activity tolerance, decreased ROM, decreased strength, increased edema, impaired flexibility, impaired UE functional use, improper body mechanics, postural dysfunction, and pain.   ACTIVITY LIMITATIONS: carrying, lifting, sleeping, bathing, dressing, and reach over head  PARTICIPATION LIMITATIONS: meal prep, cleaning, laundry, shopping, community activity, occupation, and yard work  PERSONAL FACTORS:  see PMH  are also affecting patient's functional outcome.   REHAB POTENTIAL: Good  CLINICAL DECISION MAKING: Stable/uncomplicated  EVALUATION COMPLEXITY: Low   GOALS: Goals reviewed with patient? No  SHORT TERM GOALS: Target date: 8/24  Elbow extension to  neutral Baseline: see obj Goal status: ONGOING  2.  Able to make full fist Baseline: unable due to edema at eval Goal status:  INITIAL    LONG TERM GOALS: Target date: POC date  Strength 80% of opp UE via hand held dynamometry testing Baseline: not appropriate to test at eval Goal status: INITIAL  2.  Necessary ROM avail for all ADLs Baseline: very limited at eval Goal status: INITIAL  3.  UEFI to increase to at least 70/80 Baseline: see obj Goal status: ONGOING  4.  Able to perform long arc UE motions with weight without incr pain Baseline: will be necessary for painting.  Goal status:  INITIAL     PLAN:  PT FREQUENCY: 2x/wk  PT DURATION: 7 weeks  PLANNED INTERVENTIONS: Therapeutic exercises, Therapeutic activity, Neuromuscular re-education, Patient/Family education, Self Care, Joint mobilization, Aquatic Therapy, Dry Needling, Electrical stimulation, Spinal mobilization, Cryotherapy, Moist heat, scar mobilization, Taping, Vasopneumatic device, Ultrasound, Ionotophoresis 4mg /ml Dexamethasone, Manual therapy, and Re-evaluation.  PLAN FOR NEXT SESSION:  Cont with ROM and ther ex per protocol.     Riki Altes, PTA  04/24/23 2:42 PM

## 2023-04-26 ENCOUNTER — Other Ambulatory Visit (HOSPITAL_BASED_OUTPATIENT_CLINIC_OR_DEPARTMENT_OTHER): Payer: Self-pay | Admitting: Orthopaedic Surgery

## 2023-04-26 ENCOUNTER — Encounter (HOSPITAL_BASED_OUTPATIENT_CLINIC_OR_DEPARTMENT_OTHER): Payer: Self-pay

## 2023-04-26 ENCOUNTER — Encounter (HOSPITAL_BASED_OUTPATIENT_CLINIC_OR_DEPARTMENT_OTHER): Payer: Self-pay | Admitting: Orthopaedic Surgery

## 2023-04-26 ENCOUNTER — Other Ambulatory Visit (HOSPITAL_BASED_OUTPATIENT_CLINIC_OR_DEPARTMENT_OTHER): Payer: Self-pay

## 2023-04-26 ENCOUNTER — Ambulatory Visit (HOSPITAL_BASED_OUTPATIENT_CLINIC_OR_DEPARTMENT_OTHER): Payer: Worker's Compensation

## 2023-04-26 DIAGNOSIS — M25621 Stiffness of right elbow, not elsewhere classified: Secondary | ICD-10-CM

## 2023-04-26 DIAGNOSIS — M25521 Pain in right elbow: Secondary | ICD-10-CM

## 2023-04-26 DIAGNOSIS — R600 Localized edema: Secondary | ICD-10-CM

## 2023-04-26 DIAGNOSIS — M6281 Muscle weakness (generalized): Secondary | ICD-10-CM

## 2023-04-26 DIAGNOSIS — S42491A Other displaced fracture of lower end of right humerus, initial encounter for closed fracture: Secondary | ICD-10-CM | POA: Diagnosis not present

## 2023-04-26 MED ORDER — TRAMADOL HCL 50 MG PO TABS
50.0000 mg | ORAL_TABLET | Freq: Four times a day (QID) | ORAL | 1 refills | Status: DC | PRN
Start: 2023-04-26 — End: 2023-11-07
  Filled 2023-04-26: qty 30, 8d supply, fill #0
  Filled 2023-05-20: qty 28, 7d supply, fill #0
  Filled 2023-06-05: qty 28, 7d supply, fill #1
  Filled 2023-06-27: qty 28, 7d supply, fill #2
  Filled 2023-07-04: qty 4, 1d supply, fill #2

## 2023-04-26 MED ORDER — MELOXICAM 15 MG PO TABS
15.0000 mg | ORAL_TABLET | Freq: Every day | ORAL | 0 refills | Status: DC
  Filled 2023-04-26 – 2023-05-20 (×2): qty 30, 30d supply, fill #0

## 2023-04-26 MED ORDER — TRAMADOL HCL 50 MG PO TABS: 50.0000 mg | ORAL_TABLET | Freq: Four times a day (QID) | ORAL | 0 refills | Status: DC | PRN

## 2023-04-26 NOTE — Therapy (Signed)
OUTPATIENT PHYSICAL THERAPY TREATMENT    Patient Name: Mark Miranda MRN: [id number] DOB:25-Sep-1970, 52 y.o., male Today's Date: 04/26/2023  END OF SESSION:  PT End of Session - 04/26/23 0828     Visit Number 12    Number of Visits 25    Date for PT Re-Evaluation 06/03/23    Authorization Type Workers Comp, Managed MCD    PT Start Time 0800    PT Stop Time [phone]    PT Time Calculation (min) 44 min    Activity Tolerance Patient tolerated treatment well    Behavior During Therapy [facility] for tasks assessed/performed                     Past Medical History:  Diagnosis Date   BK viremia    Closed fracture of right elbow 03/04/2023   Depression    Diabetic peripheral neuropathy (HCC) 11/24/2015   End stage renal disease (HCC)    Erectile dysfunction    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   Hypercholesterolemia    Hypertension    Metabolic acidosis    Migraines    Recurrent peritonitis (HCC)    Type 1 diabetes with renal manifestations, controlled (HCC)    Vitamin D deficiency    Past Surgical History:  Procedure Laterality Date   AV FISTULA PLACEMENT  01/10/2012   Procedure: ARTERIOVENOUS (AV) FISTULA CREATION;  Surgeon: Sherren Kerns, MD;  Location: [facility] OR;  Service: Vascular;  Laterality: Left;  Left Brachial Cephalic Arteriovenous Fistula   CAPD INSERTION  01/09/2012   Procedure: CONTINUOUS AMBULATORY PERITONEAL DIALYSIS  (CAPD) CATHETER INSERTION;  Surgeon: Ernestene Mention, MD;  Location: [facility] OR;  Service: General;  Laterality: N/A;  Exteriorization PD Cath   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   received left kidney, 3 renal arteries, 1 renal vein, ureter and pancreas from 52 year old   INSERTION OF DIALYSIS CATHETER  01/13/2012   Procedure: INSERTION OF DIALYSIS CATHETER;  Surgeon: Larina Earthly, MD;  Location: [facility] OR;  Service: Vascular;  Laterality: Right;  Internal Jugular   LAPAROSCOPY  03/13/2012    Procedure: LAPAROSCOPY DIAGNOSTIC;  Surgeon: Ardeth Sportsman, MD;  Location: [facility] OR;  Service: General;  Laterality: N/A;   LIVER BIOPSY  04/02/2013   open   ORIF HUMERUS FRACTURE Right 03/07/2023   Procedure: OPEN REDUCTION INTERNAL FIXATION (ORIF) DISTAL HUMERUS FRACTURE;  Surgeon: Huel Cote, MD;  Location: [facility] ORS;  Service: Orthopedics;  Laterality: Right;   PERITONEAL CATHETER INSERTION     PERITONEAL CATHETER REMOVAL     SMALL INTESTINE SURGERY  04/02/2013   TRICEPS TENDON REPAIR  03/07/2023   Procedure: TRICEPS TENDON REPAIR;  Surgeon: Huel Cote, MD;  Location: [facility] ORS;  Service: Orthopedics;;   ULNAR NERVE TRANSPOSITION Right 03/07/2023   Procedure: ULNAR NERVE EXPLORATION;  Surgeon: Huel Cote, MD;  Location: [facility] ORS;  Service: Orthopedics;  Laterality: Right;   Patient Active Problem List   Diagnosis Date Noted   Closed fracture of right distal humerus 03/07/2023   Closed bicondylar fracture of distal end of right humerus 03/04/2023   Type 2 diabetes mellitus with diabetic chronic kidney disease (HCC) 07/22/2022   Depression, major, 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/2022   Erectile dysfunction 07/24/2017   Vitamin D deficiency, unspecified 07/24/2017   Low back pain 02/25/2016   Chronic fatigue 01/25/2016   Diabetic peripheral neuropathy (HCC) 11/24/2015   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  Depression 11/02/2015   Tobacco abuse 11/02/2015   End stage renal disease (HCC) 12/29/2011   HTN (hypertension) 01/25/2011    REFERRING PROVIDER: Steward Drone, MD  REFERRING DIAG: [phone] (ICD-10-CM) - Other closed displaced fracture of distal end of right humerus, initial encounter  Post op distal humeral ORIF, ulnar nerve neuroplasty, triceps tendon repair Rationale for Evaluation and Treatment: Rehabilitation  THERAPY DIAG:  Localized edema  Pain in right elbow  Muscle weakness (generalized)  Stiffness of right elbow, not  elsewhere classified  ONSET DATE: DOS 03/07/23   SUBJECTIVE:                                                                                                                                                                                           SUBJECTIVE STATEMENT:  Pt now 7 weeks s/p. Waiting on hinge brace. Pt reports 2/10 pain in elbow over olecranon. Worse with movement.    PERTINENT HISTORY:  Vit D deficiency, h/o ESRD & kidney, pancreas transplant  PAIN:  NPRS:  2/10 current, 5/10 worst, 2/10 best Location:  post elbow  PRECAUTIONS:  None  RED FLAGS: None   WEIGHT BEARING RESTRICTIONS:  Yes NWB  FALLS:  Has patient fallen in last 6 months? Yes. 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  PATIENT GOALS:  Painting- steady hand, back to normal   OBJECTIVE:   HAND DOMINANCE:  Right      UPPER EXTREMITY ROM:   Active ROM Right eval Right 8/30  Right 9/6 (PROM)  Shoulder flexion 120     Shoulder extension       Shoulder abduction       Shoulder adduction       Shoulder extension       Shoulder internal rotation       Shoulder external rotation       Elbow flexion 80 96  98  Elbow extension -30 -29  -33  Wrist flexion       Wrist extension       Wrist ulnar deviation       Wrist radial deviation       Wrist pronation       Wrist supination        (Blank rows = not tested)   UEFI:  8/80  TODAY'S TREATMENT:   PROM for R elbow flexion/extension Supine cane flexion 2x10 Supine SA punch 1#/0# 2x10 Supine ABC 1# x1 Pulleys flexion x75min Standing active shoulder flexion 2x10 Standing active abduction 2x10    PATIENT EDUCATION:  Education details: Anatomy of condition, POC, HEP, exercise form/rationale, objective findings, protocol restrictions.  PT answered pt's  questions. Person educated: Patient Education method: Explanation, Demonstration, Tactile cues, Verbal cues, and Handouts Education comprehension: verbalized understanding, returned demonstration, verbal cues required, tactile cues required, and needs further education  HOME EXERCISE PROGRAM: [id number]   ASSESSMENT:  CLINICAL IMPRESSION:  Pt had increased elbow pain with SA punches today, so took away resistance with improved tolerance. He does not soreness over olecranon and hypersensitivity to touch. Pt able to complete all tasks without significant pain level, though he did have discomfort over olecranon area. Area of scabbing present over medial margin of incision with mild swelling. No worsening from previous session. Will continue to monitor.   OBJECTIVE IMPAIRMENTS: decreased activity tolerance, decreased ROM, decreased strength, increased edema, impaired flexibility, impaired UE functional use, improper body mechanics, postural dysfunction, and pain.   ACTIVITY LIMITATIONS: carrying, lifting, sleeping, bathing, dressing, and reach over head  PARTICIPATION LIMITATIONS: meal prep, cleaning, laundry, shopping, community activity, occupation, and yard work  PERSONAL FACTORS:  see PMH  are also affecting patient's functional outcome.   REHAB POTENTIAL: Good  CLINICAL DECISION MAKING: Stable/uncomplicated  EVALUATION COMPLEXITY: Low   GOALS: Goals reviewed with patient? No  SHORT TERM GOALS: Target date: 8/24  Elbow extension to  neutral Baseline: see obj Goal status: ONGOING  2.  Able to make full fist Baseline: unable due to edema at eval Goal status:  MET 9/18    LONG TERM GOALS: Target date: POC date  Strength 80% of opp UE via hand held dynamometry testing Baseline: not appropriate to test at eval Goal status: INITIAL  2.  Necessary ROM avail for all ADLs Baseline: very limited at eval Goal status: INITIAL  3.  UEFI to increase to at least 70/80 Baseline:  see obj Goal status: ONGOING  4.  Able to perform long arc UE motions with weight without incr pain Baseline: will be necessary for painting.  Goal status: INITIAL     PLAN:  PT FREQUENCY: 2x/wk  PT DURATION: 7 weeks  PLANNED INTERVENTIONS: Therapeutic exercises, Therapeutic activity, Neuromuscular re-education, Patient/Family education, Self Care, Joint mobilization, Aquatic Therapy, Dry Needling, Electrical stimulation, Spinal mobilization, Cryotherapy, Moist heat, scar mobilization, Taping, Vasopneumatic device, Ultrasound, Ionotophoresis 4mg /ml Dexamethasone, Manual therapy, and Re-evaluation.  PLAN FOR NEXT SESSION:  Cont with ROM and ther ex per protocol.     Riki Altes, PTA  04/26/23 8:47 AM

## 2023-05-02 NOTE — Therapy (Signed)
OUTPATIENT PHYSICAL THERAPY TREATMENT    Patient Name: Mark Miranda MRN: [id number] DOB:Aug 15, 1970, 52 y.o., male Today's Date: 05/04/2023  END OF SESSION:  PT End of Session - 05/03/23 0917     Visit Number 13    Number of Visits 25    Date for PT Re-Evaluation 06/03/23    Authorization Type Workers Comp, Managed MCD    PT Start Time [phone]    PT Stop Time [phone]    PT Time Calculation (min) 39 min    Activity Tolerance Patient tolerated treatment well    Behavior During Therapy [facility] for tasks assessed/performed                      Past Medical History:  Diagnosis Date   BK viremia    Closed fracture of right elbow 03/04/2023   Depression    Diabetic peripheral neuropathy (HCC) 11/24/2015   End stage renal disease (HCC)    Erectile dysfunction    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   Hypercholesterolemia    Hypertension    Metabolic acidosis    Migraines    Recurrent peritonitis (HCC)    Type 1 diabetes with renal manifestations, controlled (HCC)    Vitamin D deficiency    Past Surgical History:  Procedure Laterality Date   AV FISTULA PLACEMENT  01/10/2012   Procedure: ARTERIOVENOUS (AV) FISTULA CREATION;  Surgeon: Sherren Kerns, MD;  Location: [facility] OR;  Service: Vascular;  Laterality: Left;  Left Brachial Cephalic Arteriovenous Fistula   CAPD INSERTION  01/09/2012   Procedure: CONTINUOUS AMBULATORY PERITONEAL DIALYSIS  (CAPD) CATHETER INSERTION;  Surgeon: Ernestene Mention, MD;  Location: [facility] OR;  Service: General;  Laterality: N/A;  Exteriorization PD Cath   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   received left kidney, 3 renal arteries, 1 renal vein, ureter and pancreas from 52 year old   INSERTION OF DIALYSIS CATHETER  01/13/2012   Procedure: INSERTION OF DIALYSIS CATHETER;  Surgeon: Larina Earthly, MD;  Location: [facility] OR;  Service: Vascular;  Laterality: Right;  Internal Jugular   LAPAROSCOPY  03/13/2012    Procedure: LAPAROSCOPY DIAGNOSTIC;  Surgeon: Ardeth Sportsman, MD;  Location: [facility] OR;  Service: General;  Laterality: N/A;   LIVER BIOPSY  04/02/2013   open   ORIF HUMERUS FRACTURE Right 03/07/2023   Procedure: OPEN REDUCTION INTERNAL FIXATION (ORIF) DISTAL HUMERUS FRACTURE;  Surgeon: Huel Cote, MD;  Location: [facility] ORS;  Service: Orthopedics;  Laterality: Right;   PERITONEAL CATHETER INSERTION     PERITONEAL CATHETER REMOVAL     SMALL INTESTINE SURGERY  04/02/2013   TRICEPS TENDON REPAIR  03/07/2023   Procedure: TRICEPS TENDON REPAIR;  Surgeon: Huel Cote, MD;  Location: [facility] ORS;  Service: Orthopedics;;   ULNAR NERVE TRANSPOSITION Right 03/07/2023   Procedure: ULNAR NERVE EXPLORATION;  Surgeon: Huel Cote, MD;  Location: [facility] ORS;  Service: Orthopedics;  Laterality: Right;   Patient Active Problem List   Diagnosis Date Noted   Closed fracture of right distal humerus 03/07/2023   Closed bicondylar fracture of distal end of right humerus 03/04/2023   Type 2 diabetes mellitus with diabetic chronic kidney disease (HCC) 07/22/2022   Depression, major, 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/2022   Erectile dysfunction 07/24/2017   Vitamin D deficiency, unspecified 07/24/2017   Low back pain 02/25/2016   Chronic fatigue 01/25/2016   Diabetic peripheral neuropathy (HCC) 11/24/2015   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  Depression 11/02/2015   Tobacco abuse 11/02/2015   End stage renal disease (HCC) 12/29/2011   HTN (hypertension) 01/25/2011    REFERRING PROVIDER: Steward Drone, MD  REFERRING DIAG: [phone] (ICD-10-CM) - Other closed displaced fracture of distal end of right humerus, initial encounter  Post op distal humeral ORIF, ulnar nerve neuroplasty, triceps tendon repair Rationale for Evaluation and Treatment: Rehabilitation  THERAPY DIAG:  Pain in right elbow  Stiffness of right elbow, not elsewhere classified  Muscle weakness  (generalized)  ONSET DATE: DOS 03/07/23   SUBJECTIVE:                                                                                                                                                                                           SUBJECTIVE STATEMENT:  Pt is 8 weeks and 1 day s/p R distal humeral ORIF, R triceps tendon repair, and R ulnar nerve neuroplasty.  Pt states he should receive the hinged brace this week.  Pt reports no progress with ROM/stiffness.  Pt states his elbow feels more comfortable overall.  Pt states he did try to do a push up on his bed frame and reports having a little pain.  PT instructed him to not do that and to not perform any push ups or exercises that apply resistance to triceps.  Pt reports 1/10 pain in elbow over olecranon.  He had a little pain after prior Rx.     PERTINENT HISTORY:  Vit D deficiency, h/o ESRD & kidney, pancreas transplant  PAIN:  NPRS:  1/10 current, 5/10 worst, 2/10 best Location:  post elbow  PRECAUTIONS:  None  RED FLAGS: None   WEIGHT BEARING RESTRICTIONS:  Yes NWB  FALLS:  Has patient fallen in last 6 months? Yes. 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  PATIENT GOALS:  Painting- steady hand, back to normal   OBJECTIVE:   HAND DOMINANCE:  Right   TODAY'S TREATMENT:    UPPER EXTREMITY ROM:   Active ROM Right eval Right 8/30  Right 9/6 (PROM) Right 9/25 (PROM)  Shoulder flexion 120      Shoulder extension        Shoulder abduction        Shoulder adduction        Shoulder extension        Shoulder internal rotation        Shoulder external rotation        Elbow flexion 80 96  98 113  Elbow extension -30 -29  -33 -28  Wrist flexion        Wrist extension  Wrist ulnar deviation        Wrist radial deviation        Wrist pronation        Wrist supination         (Blank rows = not tested)                                                                                                                                  Shoulder flexion AROM:  R:  142, L:  164    PROM for R elbow flexion/extension and R shoulder flexion Supine SA punch 2x10 AROM/AAROM (with assistance from PT) Supine ABC x1 Pulleys flexion x53min Supine active elbow extension 2x10 Standing active shoulder flexion 2x10 Standing active scaption 2x10    PATIENT EDUCATION:  Education details: Anatomy of condition, POC, HEP, exercise form/rationale, objective findings, protocol restrictions.  PT instructed pt to not perform any push ups.   Person educated: Patient Education method: Explanation, Demonstration, Tactile cues, Verbal cues Education comprehension: verbalized understanding, returned demonstration, verbal cues required, tactile cues required, and needs further education  HOME EXERCISE PROGRAM: [id number]   ASSESSMENT:  CLINICAL IMPRESSION:  Pt continues to have tightness and limitations with elbow extension ROM.  Pt is improving with elbow flexion PROM and demonstrated some improvement in extension PROM as evidenced by goniometric measurements.  Pt tolerated elbow PROM well.  He had pain with supine serratus punch AROM and supine shoulder ABC today.  PT did not perform with resistance today and actually performed supine serratus punch with some assistance from PT which reduced pain.  PT worked on form with supine shoulder ABC and pt felt better with improved form.  Pt responded well to Rx reporting a slight increase in pain after Rx.  Pt should benefit from cont skilled PT services per protocol to improve ROM, address ongoing goals, and to assist in restoring desired level of function.     OBJECTIVE IMPAIRMENTS: decreased activity tolerance, decreased ROM, decreased strength, increased edema, impaired flexibility, impaired UE functional use, improper body mechanics, postural dysfunction, and pain.   ACTIVITY LIMITATIONS: carrying, lifting,  sleeping, bathing, dressing, and reach over head  PARTICIPATION LIMITATIONS: meal prep, cleaning, laundry, shopping, community activity, occupation, and yard work  PERSONAL FACTORS:  see PMH  are also affecting patient's functional outcome.   REHAB POTENTIAL: Good  CLINICAL DECISION MAKING: Stable/uncomplicated  EVALUATION COMPLEXITY: Low   GOALS: Goals reviewed with patient? No  SHORT TERM GOALS: Target date: 8/24  Elbow extension to  neutral Baseline: see obj Goal status: ONGOING  2.  Able to make full fist Baseline: unable due to edema at eval Goal status:  MET 9/18    LONG TERM GOALS: Target date: POC date  Strength 80% of opp UE via hand held dynamometry testing Baseline: not appropriate to test at eval Goal status: INITIAL  2.  Necessary ROM avail for all ADLs Baseline: very limited at eval Goal status:  INITIAL  3.  UEFI to increase to at least 70/80 Baseline: see obj Goal status: ONGOING  4.  Able to perform long arc UE motions with weight without incr pain Baseline: will be necessary for painting.  Goal status: INITIAL     PLAN:  PT FREQUENCY: 2x/wk  PT DURATION: 7 weeks  PLANNED INTERVENTIONS: Therapeutic exercises, Therapeutic activity, Neuromuscular re-education, Patient/Family education, Self Care, Joint mobilization, Aquatic Therapy, Dry Needling, Electrical stimulation, Spinal mobilization, Cryotherapy, Moist heat, scar mobilization, Taping, Vasopneumatic device, Ultrasound, Ionotophoresis 4mg /ml Dexamethasone, Manual therapy, and Re-evaluation.  PLAN FOR NEXT SESSION:  Cont with ROM and ther ex per protocol.     Audie Clear III PT, DPT 05/04/23 5:08 PM

## 2023-05-03 ENCOUNTER — Ambulatory Visit (HOSPITAL_BASED_OUTPATIENT_CLINIC_OR_DEPARTMENT_OTHER): Payer: Worker's Compensation | Admitting: Physical Therapy

## 2023-05-03 DIAGNOSIS — S42491A Other displaced fracture of lower end of right humerus, initial encounter for closed fracture: Secondary | ICD-10-CM | POA: Diagnosis not present

## 2023-05-03 DIAGNOSIS — M25621 Stiffness of right elbow, not elsewhere classified: Secondary | ICD-10-CM

## 2023-05-03 DIAGNOSIS — M6281 Muscle weakness (generalized): Secondary | ICD-10-CM

## 2023-05-03 DIAGNOSIS — M25521 Pain in right elbow: Secondary | ICD-10-CM

## 2023-05-04 ENCOUNTER — Encounter (HOSPITAL_BASED_OUTPATIENT_CLINIC_OR_DEPARTMENT_OTHER): Payer: Self-pay | Admitting: Physical Therapy

## 2023-05-05 ENCOUNTER — Encounter (HOSPITAL_BASED_OUTPATIENT_CLINIC_OR_DEPARTMENT_OTHER): Payer: Self-pay

## 2023-05-05 ENCOUNTER — Ambulatory Visit (HOSPITAL_BASED_OUTPATIENT_CLINIC_OR_DEPARTMENT_OTHER): Payer: Worker's Compensation | Attending: Orthopaedic Surgery

## 2023-05-05 DIAGNOSIS — M25521 Pain in right elbow: Secondary | ICD-10-CM | POA: Insufficient documentation

## 2023-05-05 DIAGNOSIS — M25621 Stiffness of right elbow, not elsewhere classified: Secondary | ICD-10-CM | POA: Insufficient documentation

## 2023-05-05 DIAGNOSIS — Z9889 Other specified postprocedural states: Secondary | ICD-10-CM | POA: Diagnosis not present

## 2023-05-05 DIAGNOSIS — R600 Localized edema: Secondary | ICD-10-CM | POA: Insufficient documentation

## 2023-05-05 DIAGNOSIS — Z4789 Encounter for other orthopedic aftercare: Secondary | ICD-10-CM | POA: Diagnosis not present

## 2023-05-05 DIAGNOSIS — M6281 Muscle weakness (generalized): Secondary | ICD-10-CM | POA: Diagnosis not present

## 2023-05-05 NOTE — Therapy (Signed)
OUTPATIENT PHYSICAL THERAPY TREATMENT    Patient Name: Mark Miranda MRN: [id number] DOB:[birth year], 52 y.o., male Today's Date: 05/05/2023  END OF SESSION:  PT End of Session - 05/05/23 0959     Visit Number 14    Number of Visits 25    Date for PT Re-Evaluation 06/03/23    Authorization Type Workers Comp, Managed MCD    PT Start Time [phone]    PT Stop Time 1008    PT Time Calculation (min) 43 min    Activity Tolerance Patient tolerated treatment well    Behavior During Therapy [facility] for tasks assessed/performed                       Past Medical History:  Diagnosis Date   BK viremia    Closed fracture of right elbow 03/04/2023   Depression    Diabetic peripheral neuropathy (HCC) 11/24/2015   End stage renal disease (HCC)    Erectile dysfunction    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   Hypercholesterolemia    Hypertension    Metabolic acidosis    Migraines    Recurrent peritonitis (HCC)    Type 1 diabetes with renal manifestations, controlled (HCC)    Vitamin D deficiency    Past Surgical History:  Procedure Laterality Date   AV FISTULA PLACEMENT  01/10/2012   Procedure: ARTERIOVENOUS (AV) FISTULA CREATION;  Surgeon: Sherren Kerns, MD;  Location: [facility] OR;  Service: Vascular;  Laterality: Left;  Left Brachial Cephalic Arteriovenous Fistula   CAPD INSERTION  01/09/2012   Procedure: CONTINUOUS AMBULATORY PERITONEAL DIALYSIS  (CAPD) CATHETER INSERTION;  Surgeon: Ernestene Mention, MD;  Location: [facility] OR;  Service: General;  Laterality: N/A;  Exteriorization PD Cath   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   received left kidney, 3 renal arteries, 1 renal vein, ureter and pancreas from 52 year old   INSERTION OF DIALYSIS CATHETER  01/13/2012   Procedure: INSERTION OF DIALYSIS CATHETER;  Surgeon: Larina Earthly, MD;  Location: [facility] OR;  Service: Vascular;  Laterality: Right;  Internal Jugular   LAPAROSCOPY  03/13/2012    Procedure: LAPAROSCOPY DIAGNOSTIC;  Surgeon: Ardeth Sportsman, MD;  Location: [facility] OR;  Service: General;  Laterality: N/A;   LIVER BIOPSY  04/02/2013   open   ORIF HUMERUS FRACTURE Right 03/07/2023   Procedure: OPEN REDUCTION INTERNAL FIXATION (ORIF) DISTAL HUMERUS FRACTURE;  Surgeon: Huel Cote, MD;  Location: [facility] ORS;  Service: Orthopedics;  Laterality: Right;   PERITONEAL CATHETER INSERTION     PERITONEAL CATHETER REMOVAL     SMALL INTESTINE SURGERY  04/02/2013   TRICEPS TENDON REPAIR  03/07/2023   Procedure: TRICEPS TENDON REPAIR;  Surgeon: Huel Cote, MD;  Location: [facility] ORS;  Service: Orthopedics;;   ULNAR NERVE TRANSPOSITION Right 03/07/2023   Procedure: ULNAR NERVE EXPLORATION;  Surgeon: Huel Cote, MD;  Location: [facility] ORS;  Service: Orthopedics;  Laterality: Right;   Patient Active Problem List   Diagnosis Date Noted   Closed fracture of right distal humerus 03/07/2023   Closed bicondylar fracture of distal end of right humerus 03/04/2023   Type 2 diabetes mellitus with diabetic chronic kidney disease (HCC) 07/22/2022   Depression, major, 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/2022   Erectile dysfunction 07/24/2017   Vitamin D deficiency, unspecified 07/24/2017   Low back pain 02/25/2016   Chronic fatigue 01/25/2016   Diabetic peripheral neuropathy (HCC) 11/24/2015   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  Depression 11/02/2015   Tobacco abuse 11/02/2015   End stage renal disease (HCC) 12/29/2011   HTN (hypertension) 01/25/2011    REFERRING PROVIDER: Steward Drone, MD  REFERRING DIAG: [phone] (ICD-10-CM) - Other closed displaced fracture of distal end of right humerus, initial encounter  Post op distal humeral ORIF, ulnar nerve neuroplasty, triceps tendon repair Rationale for Evaluation and Treatment: Rehabilitation  THERAPY DIAG:  Localized edema  Muscle weakness (generalized)  Stiffness of right elbow, not elsewhere  classified  Pain in right elbow  ONSET DATE: DOS 03/07/23   SUBJECTIVE:                                                                                                                                                                                           SUBJECTIVE STATEMENT:  Pt is 8 weeks and 3 days s/p R distal humeral ORIF, R triceps tendon repair, and R ulnar nerve neuroplasty.  Pt states he should receive the hinged brace on Monday. Reports continued discomfort at rest.  PERTINENT HISTORY:  Vit D deficiency, h/o ESRD & kidney, pancreas transplant  PAIN:  NPRS:  1/10 current, 5/10 worst, 2/10 best Location:  post elbow  PRECAUTIONS:  None  RED FLAGS: None   WEIGHT BEARING RESTRICTIONS:  Yes NWB  FALLS:  Has patient fallen in last 6 months? Yes. 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  PATIENT GOALS:  Painting- steady hand, back to normal   OBJECTIVE:   HAND DOMINANCE:  Right   TODAY'S TREATMENT:    UPPER EXTREMITY ROM:   Active ROM Right eval Right 8/30  Right 9/6 (PROM) Right 9/25 (PROM) Right 9/27 (PROM)  Shoulder flexion 120       Shoulder extension         Shoulder abduction         Shoulder adduction         Shoulder extension         Shoulder internal rotation         Shoulder external rotation         Elbow flexion 80 96  98 113 115  Elbow extension -30 -29  -33 -28 -20  Wrist flexion         Wrist extension         Wrist ulnar deviation         Wrist radial deviation         Wrist pronation         Wrist supination          (Blank rows = not tested)  Shoulder flexion AROM:  R:  142, L:  164    PROM for R elbow flexion/extension Supine SA punch 2x10 AROM Supine ABC x1 Supine active shoulder flexion 2x10 Pulleys flexion x67min, abd  Supine active elbow extension 2x10 Standing active shoulder flexion 2x10 Standing active scaption 2x10    PATIENT EDUCATION:  Education details: Anatomy of condition, POC, HEP, exercise form/rationale, objective findings, protocol restrictions.  PT instructed pt to not perform any push ups.   Person educated: Patient Education method: Explanation, Demonstration, Tactile cues, Verbal cues Education comprehension: verbalized understanding, returned demonstration, verbal cues required, tactile cues required, and needs further education  HOME EXERCISE PROGRAM: [id number]   ASSESSMENT:  CLINICAL IMPRESSION:  Improvement in PROM measurements today following PROM from clinician. Cavitations in elbow joint felt with passive extension returning from end range flexion. Pt described these as non painful. Will monitor this as we progress. Mild R shoulder stiffness with active flexion. Will continue to work on improving UE functional ROM and strength.    OBJECTIVE IMPAIRMENTS: decreased activity tolerance, decreased ROM, decreased strength, increased edema, impaired flexibility, impaired UE functional use, improper body mechanics, postural dysfunction, and pain.   ACTIVITY LIMITATIONS: carrying, lifting, sleeping, bathing, dressing, and reach over head  PARTICIPATION LIMITATIONS: meal prep, cleaning, laundry, shopping, community activity, occupation, and yard work  PERSONAL FACTORS:  see PMH  are also affecting patient's functional outcome.   REHAB POTENTIAL: Good  CLINICAL DECISION MAKING: Stable/uncomplicated  EVALUATION COMPLEXITY: Low   GOALS: Goals reviewed with patient? No  SHORT TERM GOALS: Target date: 8/24  Elbow extension to  neutral Baseline: see obj Goal status: ONGOING  2.  Able to make full fist Baseline: unable due to edema at eval Goal status:  MET 9/18    LONG TERM GOALS: Target date: POC date  Strength 80% of opp UE via hand held dynamometry  testing Baseline: not appropriate to test at eval Goal status: INITIAL  2.  Necessary ROM avail for all ADLs Baseline: very limited at eval Goal status: INITIAL  3.  UEFI to increase to at least 70/80 Baseline: see obj Goal status: ONGOING  4.  Able to perform long arc UE motions with weight without incr pain Baseline: will be necessary for painting.  Goal status: INITIAL     PLAN:  PT FREQUENCY: 2x/wk  PT DURATION: 7 weeks  PLANNED INTERVENTIONS: Therapeutic exercises, Therapeutic activity, Neuromuscular re-education, Patient/Family education, Self Care, Joint mobilization, Aquatic Therapy, Dry Needling, Electrical stimulation, Spinal mobilization, Cryotherapy, Moist heat, scar mobilization, Taping, Vasopneumatic device, Ultrasound, Ionotophoresis 4mg /ml Dexamethasone, Manual therapy, and Re-evaluation.  PLAN FOR NEXT SESSION:  Cont with ROM and ther ex per protocol.     Riki Altes, PTA  05/05/23 10:20 AM

## 2023-05-08 ENCOUNTER — Other Ambulatory Visit (HOSPITAL_BASED_OUTPATIENT_CLINIC_OR_DEPARTMENT_OTHER): Payer: Self-pay

## 2023-05-10 ENCOUNTER — Encounter (HOSPITAL_BASED_OUTPATIENT_CLINIC_OR_DEPARTMENT_OTHER): Payer: Self-pay

## 2023-05-10 ENCOUNTER — Ambulatory Visit (HOSPITAL_BASED_OUTPATIENT_CLINIC_OR_DEPARTMENT_OTHER): Payer: Worker's Compensation | Attending: Orthopaedic Surgery

## 2023-05-10 DIAGNOSIS — M25521 Pain in right elbow: Secondary | ICD-10-CM | POA: Insufficient documentation

## 2023-05-10 DIAGNOSIS — M25621 Stiffness of right elbow, not elsewhere classified: Secondary | ICD-10-CM | POA: Diagnosis present

## 2023-05-10 DIAGNOSIS — M6281 Muscle weakness (generalized): Secondary | ICD-10-CM | POA: Insufficient documentation

## 2023-05-10 DIAGNOSIS — R600 Localized edema: Secondary | ICD-10-CM | POA: Insufficient documentation

## 2023-05-10 NOTE — Therapy (Signed)
OUTPATIENT PHYSICAL THERAPY TREATMENT    Patient Name: Mark Miranda MRN: [id number] DOB:1970/11/22, 52 y.o., male Today's Date: 05/10/2023  END OF SESSION:  PT End of Session - 05/10/23 0919     Visit Number 15    Number of Visits 25    Date for PT Re-Evaluation 06/03/23    Authorization Type Workers Comp, Managed MCD    PT Start Time 0930    PT Stop Time 1015    PT Time Calculation (min) 45 min    Activity Tolerance Patient tolerated treatment well    Behavior During Therapy WFL for tasks assessed/performed                        Past Medical History:  Diagnosis Date   BK viremia    Closed fracture of right elbow 03/04/2023   Depression    Diabetic peripheral neuropathy (HCC) 11/24/2015   End stage renal disease (HCC)    Erectile dysfunction    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   Hypercholesterolemia    Hypertension    Metabolic acidosis    Migraines    Recurrent peritonitis (HCC)    Type 1 diabetes with renal manifestations, controlled (HCC)    Vitamin D deficiency    Past Surgical History:  Procedure Laterality Date   AV FISTULA PLACEMENT  01/10/2012   Procedure: ARTERIOVENOUS (AV) FISTULA CREATION;  Surgeon: Sherren Kerns, MD;  Location: [facility] OR;  Service: Vascular;  Laterality: Left;  Left Brachial Cephalic Arteriovenous Fistula   CAPD INSERTION  01/09/2012   Procedure: CONTINUOUS AMBULATORY PERITONEAL DIALYSIS  (CAPD) CATHETER INSERTION;  Surgeon: Ernestene Mention, MD;  Location: [facility] OR;  Service: General;  Laterality: N/A;  Exteriorization PD Cath   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   received left kidney, 3 renal arteries, 1 renal vein, ureter and pancreas from 52 year old   INSERTION OF DIALYSIS CATHETER  01/13/2012   Procedure: INSERTION OF DIALYSIS CATHETER;  Surgeon: Larina Earthly, MD;  Location: [facility] OR;  Service: Vascular;  Laterality: Right;  Internal Jugular   LAPAROSCOPY   03/13/2012   Procedure: LAPAROSCOPY DIAGNOSTIC;  Surgeon: Ardeth Sportsman, MD;  Location: [facility] OR;  Service: General;  Laterality: N/A;   LIVER BIOPSY  04/02/2013   open   ORIF HUMERUS FRACTURE Right 03/07/2023   Procedure: OPEN REDUCTION INTERNAL FIXATION (ORIF) DISTAL HUMERUS FRACTURE;  Surgeon: Huel Cote, MD;  Location: [facility] ORS;  Service: Orthopedics;  Laterality: Right;   PERITONEAL CATHETER INSERTION     PERITONEAL CATHETER REMOVAL     SMALL INTESTINE SURGERY  04/02/2013   TRICEPS TENDON REPAIR  03/07/2023   Procedure: TRICEPS TENDON REPAIR;  Surgeon: Huel Cote, MD;  Location: [facility] ORS;  Service: Orthopedics;;   ULNAR NERVE TRANSPOSITION Right 03/07/2023   Procedure: ULNAR NERVE EXPLORATION;  Surgeon: Huel Cote, MD;  Location: [facility] ORS;  Service: Orthopedics;  Laterality: Right;   Patient Active Problem List   Diagnosis Date Noted   Closed fracture of right distal humerus 03/07/2023   Closed bicondylar fracture of distal end of right humerus 03/04/2023   Type 2 diabetes mellitus with diabetic chronic kidney disease (HCC) 07/22/2022   Depression, major, 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/2022   Erectile dysfunction 07/24/2017   Vitamin D deficiency, unspecified 07/24/2017   Low back pain 02/25/2016   Chronic fatigue 01/25/2016   Diabetic peripheral neuropathy (HCC) 11/24/2015   History of simultaneous kidney and pancreas transplant (HCC)  11/02/2015   Depression 11/02/2015   Tobacco abuse 11/02/2015   End stage renal disease (HCC) 12/29/2011   HTN (hypertension) 01/25/2011    REFERRING PROVIDER: Steward Drone, MD  REFERRING DIAG: [phone] (ICD-10-CM) - Other closed displaced fracture of distal end of right humerus, initial encounter  Post op distal humeral ORIF, ulnar nerve neuroplasty, triceps tendon repair Rationale for Evaluation and Treatment: Rehabilitation  THERAPY DIAG:  Localized edema  Muscle weakness (generalized)  Stiffness of right elbow, not elsewhere  classified  Pain in right elbow  ONSET DATE: DOS 03/07/23   SUBJECTIVE:                                                                                                                                                                                           SUBJECTIVE STATEMENT:  Pt arrives with hinge brace. Has been locking it in end ranges to stretch throughout the day. Mild increase in soreness since starting brace on Monday.  PERTINENT HISTORY:  Vit D deficiency, h/o ESRD & kidney, pancreas transplant  PAIN:  NPRS:  1/10 current, 5/10 worst, 2/10 best Location:  post elbow  PRECAUTIONS:  None  RED FLAGS: None   WEIGHT BEARING RESTRICTIONS:  Yes NWB  FALLS:  Has patient fallen in last 6 months? Yes. 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  PATIENT GOALS:  Painting- steady hand, back to normal   OBJECTIVE:   HAND DOMINANCE:  Right      UPPER EXTREMITY ROM:   Active ROM Right eval Right 8/30  Right 9/6 (PROM) Right 9/25 (PROM) Right 9/27 (PROM)  Shoulder flexion 120       Shoulder extension         Shoulder abduction         Shoulder adduction         Shoulder extension         Shoulder internal rotation         Shoulder external rotation         Elbow flexion 80 96  98 113 115  Elbow extension -30 -29  -33 -28 -20  Wrist flexion         Wrist extension         Wrist ulnar deviation         Wrist radial deviation         Wrist pronation         Wrist supination          (Blank rows = not tested)   TODAY'S TREATMENT:  PROM for R elbow flexion/extension with forearm pronated/supinated/neutral Supine SA punch 2x15 AROM Supine ABC x1 Supine active shoulder flexion 2x10 Pulleys flexion x13min, abd Supine active elbow  flexion/extension 2x10 Standing  active shoulder flexion 2x10 Standing active scaption 2x10    PATIENT EDUCATION:  Education details: Anatomy of condition, POC, HEP, exercise form/rationale, objective findings, protocol restrictions.  PT instructed pt to not perform any push ups.   Person educated: Patient Education method: Explanation, Demonstration, Tactile cues, Verbal cues Education comprehension: verbalized understanding, returned demonstration, verbal cues required, tactile cues required, and needs further education  HOME EXERCISE PROGRAM: [id number]   ASSESSMENT:  CLINICAL IMPRESSION:  Pt with increased soreness with PROM of elbow today, so reduced intensity of overpressure. He does have crepitus with elbow extension towards end range. Will monitor pain level as we progress ROM and strength.   OBJECTIVE IMPAIRMENTS: decreased activity tolerance, decreased ROM, decreased strength, increased edema, impaired flexibility, impaired UE functional use, improper body mechanics, postural dysfunction, and pain.   ACTIVITY LIMITATIONS: carrying, lifting, sleeping, bathing, dressing, and reach over head  PARTICIPATION LIMITATIONS: meal prep, cleaning, laundry, shopping, community activity, occupation, and yard work  PERSONAL FACTORS:  see PMH  are also affecting patient's functional outcome.   REHAB POTENTIAL: Good  CLINICAL DECISION MAKING: Stable/uncomplicated  EVALUATION COMPLEXITY: Low   GOALS: Goals reviewed with patient? No  SHORT TERM GOALS: Target date: 8/24  Elbow extension to  neutral Baseline: see obj Goal status: ONGOING  2.  Able to make full fist Baseline: unable due to edema at eval Goal status:  MET 9/18    LONG TERM GOALS: Target date: POC date  Strength 80% of opp UE via hand held dynamometry testing Baseline: not appropriate to test at eval Goal status: INITIAL  2.  Necessary ROM avail for all ADLs Baseline: very limited at eval Goal status: IN PROGRESS 10/2  3.  UEFI to  increase to at least 70/80 Baseline: see obj Goal status: ONGOING  4.  Able to perform long arc UE motions with weight without incr pain Baseline: will be necessary for painting.  Goal status: INITIAL     PLAN:  PT FREQUENCY: 2x/wk  PT DURATION: 7 weeks  PLANNED INTERVENTIONS: Therapeutic exercises, Therapeutic activity, Neuromuscular re-education, Patient/Family education, Self Care, Joint mobilization, Aquatic Therapy, Dry Needling, Electrical stimulation, Spinal mobilization, Cryotherapy, Moist heat, scar mobilization, Taping, Vasopneumatic device, Ultrasound, Ionotophoresis 4mg /ml Dexamethasone, Manual therapy, and Re-evaluation.  PLAN FOR NEXT SESSION:  Cont with ROM and ther ex per protocol.     Riki Altes, PTA  05/10/23 11:13 AM

## 2023-05-12 ENCOUNTER — Ambulatory Visit (HOSPITAL_BASED_OUTPATIENT_CLINIC_OR_DEPARTMENT_OTHER): Payer: Worker's Compensation | Admitting: Physical Therapy

## 2023-05-12 DIAGNOSIS — R600 Localized edema: Secondary | ICD-10-CM | POA: Diagnosis not present

## 2023-05-12 DIAGNOSIS — M6281 Muscle weakness (generalized): Secondary | ICD-10-CM

## 2023-05-12 DIAGNOSIS — M25621 Stiffness of right elbow, not elsewhere classified: Secondary | ICD-10-CM

## 2023-05-12 DIAGNOSIS — M25521 Pain in right elbow: Secondary | ICD-10-CM

## 2023-05-12 NOTE — Therapy (Incomplete)
OUTPATIENT PHYSICAL THERAPY TREATMENT    Patient Name: Mark Miranda MRN: [id number] DOB:1971-03-14, 52 y.o., male Today's Date: 05/13/2023  END OF SESSION:   PT End of Session - 05/12/2023        Visit Number 16     Number of Visits 25     Date for PT Re-Evaluation 06/03/23     Authorization Type Workers Comp, Managed MCD     PT Start Time 1028     PT Stop Time 1108     PT Time Calculation (min) 40 min     Activity Tolerance Patient tolerated treatment well     Behavior During Therapy WFL for tasks assessed/performed                 Past Medical History:  Diagnosis Date   BK viremia    Closed fracture of right elbow 03/04/2023   Depression    Diabetic peripheral neuropathy (HCC) 11/24/2015   End stage renal disease (HCC)    Erectile dysfunction    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   Hypercholesterolemia    Hypertension    Metabolic acidosis    Migraines    Recurrent peritonitis (HCC)    Type 1 diabetes with renal manifestations, controlled (HCC)    Vitamin D deficiency    Past Surgical History:  Procedure Laterality Date   AV FISTULA PLACEMENT  01/10/2012   Procedure: ARTERIOVENOUS (AV) FISTULA CREATION;  Surgeon: Sherren Kerns, MD;  Location: [facility] OR;  Service: Vascular;  Laterality: Left;  Left Brachial Cephalic Arteriovenous Fistula   CAPD INSERTION  01/09/2012   Procedure: CONTINUOUS AMBULATORY PERITONEAL DIALYSIS  (CAPD) CATHETER INSERTION;  Surgeon: Ernestene Mention, MD;  Location: [facility] OR;  Service: General;  Laterality: N/A;  Exteriorization PD Cath   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   received left kidney, 3 renal arteries, 1 renal vein, ureter and pancreas from 52 year old   INSERTION OF DIALYSIS CATHETER  01/13/2012   Procedure: INSERTION OF DIALYSIS CATHETER;  Surgeon: Larina Earthly, MD;  Location: [facility] OR;  Service: Vascular;  Laterality: Right;  Internal Jugular   LAPAROSCOPY  03/13/2012    Procedure: LAPAROSCOPY DIAGNOSTIC;  Surgeon: Ardeth Sportsman, MD;  Location: [facility] OR;  Service: General;  Laterality: N/A;   LIVER BIOPSY  04/02/2013   open   ORIF HUMERUS FRACTURE Right 03/07/2023   Procedure: OPEN REDUCTION INTERNAL FIXATION (ORIF) DISTAL HUMERUS FRACTURE;  Surgeon: Huel Cote, MD;  Location: [facility] ORS;  Service: Orthopedics;  Laterality: Right;   PERITONEAL CATHETER INSERTION     PERITONEAL CATHETER REMOVAL     SMALL INTESTINE SURGERY  04/02/2013   TRICEPS TENDON REPAIR  03/07/2023   Procedure: TRICEPS TENDON REPAIR;  Surgeon: Huel Cote, MD;  Location: [facility] ORS;  Service: Orthopedics;;   ULNAR NERVE TRANSPOSITION Right 03/07/2023   Procedure: ULNAR NERVE EXPLORATION;  Surgeon: Huel Cote, MD;  Location: [facility] ORS;  Service: Orthopedics;  Laterality: Right;   Patient Active Problem List   Diagnosis Date Noted   Closed fracture of right distal humerus 03/07/2023   Closed bicondylar fracture of distal end of right humerus 03/04/2023   Type 2 diabetes mellitus with diabetic chronic kidney disease (HCC) 07/22/2022   Depression, major, 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/2022   Erectile dysfunction 07/24/2017   Vitamin D deficiency, unspecified 07/24/2017   Low back pain 02/25/2016   Chronic fatigue 01/25/2016   Diabetic peripheral neuropathy (HCC) 11/24/2015   History of simultaneous kidney  and pancreas transplant (HCC) 11/02/2015   Depression 11/02/2015   Tobacco abuse 11/02/2015   End stage renal disease (HCC) 12/29/2011   HTN (hypertension) 01/25/2011    REFERRING PROVIDER: Steward Drone, MD  REFERRING DIAG: S42.491A (ICD-10-CM) - Other closed displaced fracture of distal end of right humerus, initial encounter  Post op distal humeral ORIF, ulnar nerve neuroplasty, triceps tendon repair Rationale for Evaluation and Treatment: Rehabilitation  THERAPY DIAG:  Stiffness of right elbow, not elsewhere classified  Pain in right elbow  Muscle weakness  (generalized)  ONSET DATE: DOS 03/07/23   SUBJECTIVE:                                                                                                                                                                                           SUBJECTIVE STATEMENT:  Pt is 9 weeks and 3 days post op.  Pt has been wearing the hinged brace at home which helps with pain.  Pt states his elbow is very stiff though thinks extension is a little better.  Pt reports having a little more pain after Rx.  Pt reports compliance with HEP.     PERTINENT HISTORY:  Vit D deficiency, h/o ESRD & kidney, pancreas transplant  PAIN:  NPRS:  2/10 current, 5/10 worst, 2/10 best Location:  post elbow  PRECAUTIONS:  None  RED FLAGS: None   WEIGHT BEARING RESTRICTIONS:  Yes NWB  FALLS:  Has patient fallen in last 6 months? Yes. 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  PATIENT GOALS:  Painting- steady hand, back to normal   OBJECTIVE:   HAND DOMINANCE:  Right    TODAY'S TREATMENT   UPPER EXTREMITY ROM:   Active ROM Right eval Right 8/30  Right 9/6 (PROM) Right 9/25 (PROM) Right 9/27 (PROM) 10/4  Shoulder flexion 120        Shoulder extension          Shoulder abduction          Shoulder adduction          Shoulder extension          Shoulder internal rotation          Shoulder external rotation          Elbow flexion 80 96  98 113 115   Elbow extension -30 -29  -33 -28 -20 AROM/PROM:  30/21 deg  Wrist flexion          Wrist extension          Wrist ulnar deviation  Wrist radial deviation          Wrist pronation          Wrist supination           (Blank rows = not tested)                                                                                                                               PROM for R elbow flexion/extension with forearm neutral and supinated Supine static elbow extension stretch 2x1  min Supine SA punch 2x15 AROM Supine ABC x1 Supine active shoulder flexion 2x10 Pulleys flexion x63min, abd Supine active elbow  flexion/extension 2x10 Standing active shoulder flexion x10 Standing active scaption x10 Prone row 2x10   PATIENT EDUCATION:  Education details: Anatomy of condition, POC, HEP, exercise form/rationale, objective findings, protocol restrictions.  PT instructed pt to not perform any push ups.   Person educated: Patient Education method: Explanation, Demonstration, Tactile cues, Verbal cues Education comprehension: verbalized understanding, returned demonstration, verbal cues required, tactile cues required, and needs further education  HOME EXERCISE PROGRAM: [id number]   ASSESSMENT:  CLINICAL IMPRESSION:  Pt has been wearing his brace at home and reports improved pain with brace.  He continues to have stiffness and limited elbow ROM primarily in extension.  Pt tolerated extension PROM well.  Though he is limited in extension ROM, he seems to be making progress in extension PROM.  He performed exercises per protocol well with verbal, visual, and tactile cuing and instruction for correct form.  Pt responded well to Rx reporting a slight increase in pain to 2.5/10 after Rx.  Pt should benefit from cont skilled PT services to improve ROM and stiffness, address goals, and improve function.  OBJECTIVE IMPAIRMENTS: decreased activity tolerance, decreased ROM, decreased strength, increased edema, impaired flexibility, impaired UE functional use, improper body mechanics, postural dysfunction, and pain.   ACTIVITY LIMITATIONS: carrying, lifting, sleeping, bathing, dressing, and reach over head  PARTICIPATION LIMITATIONS: meal prep, cleaning, laundry, shopping, community activity, occupation, and yard work  PERSONAL FACTORS:  see PMH  are also affecting patient's functional outcome.   REHAB POTENTIAL: Good  CLINICAL DECISION MAKING:  Stable/uncomplicated  EVALUATION COMPLEXITY: Low   GOALS: Goals reviewed with patient? No  SHORT TERM GOALS: Target date: 8/24  Elbow extension to  neutral Baseline: see obj Goal status: ONGOING  2.  Able to make full fist Baseline: unable due to edema at eval Goal status:  MET 9/18    LONG TERM GOALS: Target date: POC date  Strength 80% of opp UE via hand held dynamometry testing Baseline: not appropriate to test at eval Goal status: INITIAL  2.  Necessary ROM avail for all ADLs Baseline: very limited at eval Goal status: IN PROGRESS 10/2  3.  UEFI to increase to at least 70/80 Baseline: see obj Goal status: ONGOING  4.  Able to perform long arc UE motions with weight without incr pain  Baseline: will be necessary for painting.  Goal status: INITIAL     PLAN:  PT FREQUENCY: 2x/wk  PT DURATION: 7 weeks  PLANNED INTERVENTIONS: Therapeutic exercises, Therapeutic activity, Neuromuscular re-education, Patient/Family education, Self Care, Joint mobilization, Aquatic Therapy, Dry Needling, Electrical stimulation, Spinal mobilization, Cryotherapy, Moist heat, scar mobilization, Taping, Vasopneumatic device, Ultrasound, Ionotophoresis 4mg /ml Dexamethasone, Manual therapy, and Re-evaluation.  PLAN FOR NEXT SESSION:  Cont with ROM and ther ex per protocol.     Audie Clear III PT, DPT 05/13/23 8:55 PM

## 2023-05-13 ENCOUNTER — Encounter (HOSPITAL_BASED_OUTPATIENT_CLINIC_OR_DEPARTMENT_OTHER): Payer: Self-pay | Admitting: Physical Therapy

## 2023-05-17 ENCOUNTER — Ambulatory Visit (HOSPITAL_BASED_OUTPATIENT_CLINIC_OR_DEPARTMENT_OTHER): Payer: Worker's Compensation | Attending: Family Medicine | Admitting: Physical Therapy

## 2023-05-17 DIAGNOSIS — M25521 Pain in right elbow: Secondary | ICD-10-CM | POA: Diagnosis present

## 2023-05-17 DIAGNOSIS — M25621 Stiffness of right elbow, not elsewhere classified: Secondary | ICD-10-CM | POA: Diagnosis present

## 2023-05-17 DIAGNOSIS — M6281 Muscle weakness (generalized): Secondary | ICD-10-CM | POA: Diagnosis present

## 2023-05-17 NOTE — Therapy (Signed)
OUTPATIENT PHYSICAL THERAPY TREATMENT    Patient Name: Mark Miranda MRN: [id number] DOB:1970/08/10, 52 y.o., male Today's Date: 05/17/2023  END OF SESSION:   PT End of Session - 05/12/2023        Visit Number 16     Number of Visits 25     Date for PT Re-Evaluation 06/03/23     Authorization Type Workers Comp, Managed MCD     PT Start Time 1028     PT Stop Time 1108     PT Time Calculation (min) 40 min     Activity Tolerance Patient tolerated treatment well     Behavior During Therapy WFL for tasks assessed/performed                 Past Medical History:  Diagnosis Date   BK viremia    Closed fracture of right elbow 03/04/2023   Depression    Diabetic peripheral neuropathy (HCC) 11/24/2015   End stage renal disease (HCC)    Erectile dysfunction    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   Hypercholesterolemia    Hypertension    Metabolic acidosis    Migraines    Recurrent peritonitis (HCC)    Type 1 diabetes with renal manifestations, controlled (HCC)    Vitamin D deficiency    Past Surgical History:  Procedure Laterality Date   AV FISTULA PLACEMENT  01/10/2012   Procedure: ARTERIOVENOUS (AV) FISTULA CREATION;  Surgeon: Sherren Kerns, MD;  Location: [facility] OR;  Service: Vascular;  Laterality: Left;  Left Brachial Cephalic Arteriovenous Fistula   CAPD INSERTION  01/09/2012   Procedure: CONTINUOUS AMBULATORY PERITONEAL DIALYSIS  (CAPD) CATHETER INSERTION;  Surgeon: Ernestene Mention, MD;  Location: [facility] OR;  Service: General;  Laterality: N/A;  Exteriorization PD Cath   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   received left kidney, 3 renal arteries, 1 renal vein, ureter and pancreas from 52 year old   INSERTION OF DIALYSIS CATHETER  01/13/2012   Procedure: INSERTION OF DIALYSIS CATHETER;  Surgeon: Larina Earthly, MD;  Location: [facility] OR;  Service: Vascular;  Laterality: Right;  Internal Jugular   LAPAROSCOPY  03/13/2012    Procedure: LAPAROSCOPY DIAGNOSTIC;  Surgeon: Ardeth Sportsman, MD;  Location: [facility] OR;  Service: General;  Laterality: N/A;   LIVER BIOPSY  04/02/2013   open   ORIF HUMERUS FRACTURE Right 03/07/2023   Procedure: OPEN REDUCTION INTERNAL FIXATION (ORIF) DISTAL HUMERUS FRACTURE;  Surgeon: Huel Cote, MD;  Location: [facility] ORS;  Service: Orthopedics;  Laterality: Right;   PERITONEAL CATHETER INSERTION     PERITONEAL CATHETER REMOVAL     SMALL INTESTINE SURGERY  04/02/2013   TRICEPS TENDON REPAIR  03/07/2023   Procedure: TRICEPS TENDON REPAIR;  Surgeon: Huel Cote, MD;  Location: [facility] ORS;  Service: Orthopedics;;   ULNAR NERVE TRANSPOSITION Right 03/07/2023   Procedure: ULNAR NERVE EXPLORATION;  Surgeon: Huel Cote, MD;  Location: [facility] ORS;  Service: Orthopedics;  Laterality: Right;   Patient Active Problem List   Diagnosis Date Noted   Closed fracture of right distal humerus 03/07/2023   Closed bicondylar fracture of distal end of right humerus 03/04/2023   Type 2 diabetes mellitus with diabetic chronic kidney disease (HCC) 07/22/2022   Depression, major, 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/2022   Erectile dysfunction 07/24/2017   Vitamin D deficiency, unspecified 07/24/2017   Low back pain 02/25/2016   Chronic fatigue 01/25/2016   Diabetic peripheral neuropathy (HCC) 11/24/2015   History of simultaneous kidney  and pancreas transplant (HCC) 11/02/2015   Depression 11/02/2015   Tobacco abuse 11/02/2015   End stage renal disease (HCC) 12/29/2011   HTN (hypertension) 01/25/2011    REFERRING PROVIDER: Steward Drone, MD  REFERRING DIAG: S42.491A (ICD-10-CM) - Other closed displaced fracture of distal end of right humerus, initial encounter  Post op distal humeral ORIF, ulnar nerve neuroplasty, triceps tendon repair Rationale for Evaluation and Treatment: Rehabilitation  THERAPY DIAG:  No diagnosis found.  ONSET DATE: DOS 03/07/23   SUBJECTIVE:                                                                                                                                                                                            SUBJECTIVE STATEMENT:  Pt is 10 weeks and 1 day post op.  Pt uses his brace at home to try and stretch his arm.  Pt states his elbow is very stiff though thinks extension is a little better.  Pt states he had a little increased pain after prior Rx which is normal.      PERTINENT HISTORY:  Vit D deficiency, h/o ESRD & kidney, pancreas transplant  PAIN:  NPRS:  2/10 current, 5/10 worst, 2/10 best Location:  post elbow  PRECAUTIONS:  None  RED FLAGS: None   WEIGHT BEARING RESTRICTIONS:  Yes NWB  FALLS:  Has patient fallen in last 6 months? Yes. 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  PATIENT GOALS:  Painting- steady hand, back to normal   OBJECTIVE:   HAND DOMINANCE:  Right    TODAY'S TREATMENT   UPPER EXTREMITY ROM:   Active ROM Right eval Right 8/30  Right 9/6 (PROM) Right 9/25 (PROM) Right 9/27 (PROM) 10/4 10/9  Shoulder flexion 120         Shoulder extension           Shoulder abduction           Shoulder adduction           Shoulder extension           Shoulder internal rotation           Shoulder external rotation           Elbow flexion 80 96  98 113 115    Elbow extension -30 -29  -33 -28 -20 AROM/PROM:  30/21 deg AROM/PROM:  30/23 deg  Wrist flexion           Wrist extension           Wrist ulnar deviation  Wrist radial deviation           Wrist pronation           Wrist supination            (Blank rows = not tested)                                                                                                                               PROM for R elbow flexion/extension with forearm neutral and supinated Supine static elbow extension stretch 2x2 min Supine SA punch 2x10-15, (1 set of AROM, 1 set  with assistance) Supine ABC  x1 Pulleys flexion x60min, abd Supine active elbow flexion/extension 3x10 Prone row 2x10 Prone horizontal abduction 2x10 Forearm pronation/supination 2x10   PATIENT EDUCATION:  Education details: Anatomy of condition, POC, HEP, exercise form/rationale, objective findings, protocol restrictions.  PT instructed pt to not perform any push ups.   Person educated: Patient Education method: Explanation, Demonstration, Tactile cues, Verbal cues Education comprehension: verbalized understanding, returned demonstration, verbal cues required, tactile cues required, and needs further education  HOME EXERCISE PROGRAM: [id number]   ASSESSMENT:  CLINICAL IMPRESSION:  Pt has been wearing his brace at home and reports improved pain with brace.  He continues to have stiffness and limited elbow ROM primarily in extension.  Pt tolerated extension PROM well.  Though he is limited in extension ROM, he seems to be making progress in extension PROM.  He performed exercises per protocol well with verbal, visual, and tactile cuing and instruction for correct form.  Pt responded well to Rx reporting a slight increase in pain to 2.5/10 after Rx.  Pt should benefit from cont skilled PT services to improve ROM and stiffness, address goals, and improve function.  Pt had pain with supine serratus punch and shoulder ABC which improved with instructions in correct form and assistance with form.  Little increase in pain after Rx  OBJECTIVE IMPAIRMENTS: decreased activity tolerance, decreased ROM, decreased strength, increased edema, impaired flexibility, impaired UE functional use, improper body mechanics, postural dysfunction, and pain.   ACTIVITY LIMITATIONS: carrying, lifting, sleeping, bathing, dressing, and reach over head  PARTICIPATION LIMITATIONS: meal prep, cleaning, laundry, shopping, community activity, occupation, and yard work  PERSONAL FACTORS:  see PMH  are also affecting patient's functional  outcome.   REHAB POTENTIAL: Good  CLINICAL DECISION MAKING: Stable/uncomplicated  EVALUATION COMPLEXITY: Low   GOALS: Goals reviewed with patient? No  SHORT TERM GOALS: Target date: 8/24  Elbow extension to  neutral Baseline: see obj Goal status: ONGOING  2.  Able to make full fist Baseline: unable due to edema at eval Goal status:  MET 9/18    LONG TERM GOALS: Target date: POC date  Strength 80% of opp UE via hand held dynamometry testing Baseline: not appropriate to test at eval Goal status: INITIAL  2.  Necessary ROM avail for all ADLs Baseline: very limited at eval Goal status: IN PROGRESS  10/2  3.  UEFI to increase to at least 70/80 Baseline: see obj Goal status: ONGOING  4.  Able to perform long arc UE motions with weight without incr pain Baseline: will be necessary for painting.  Goal status: INITIAL     PLAN:  PT FREQUENCY: 2x/wk  PT DURATION: 7 weeks  PLANNED INTERVENTIONS: Therapeutic exercises, Therapeutic activity, Neuromuscular re-education, Patient/Family education, Self Care, Joint mobilization, Aquatic Therapy, Dry Needling, Electrical stimulation, Spinal mobilization, Cryotherapy, Moist heat, scar mobilization, Taping, Vasopneumatic device, Ultrasound, Ionotophoresis 4mg /ml Dexamethasone, Manual therapy, and Re-evaluation.  PLAN FOR NEXT SESSION:  Cont with ROM and ther ex per protocol.     Audie Clear III PT, DPT 05/17/23 9:44 AM

## 2023-05-18 ENCOUNTER — Encounter (HOSPITAL_BASED_OUTPATIENT_CLINIC_OR_DEPARTMENT_OTHER): Payer: Self-pay | Admitting: Physical Therapy

## 2023-05-19 ENCOUNTER — Ambulatory Visit (HOSPITAL_BASED_OUTPATIENT_CLINIC_OR_DEPARTMENT_OTHER): Payer: Worker's Compensation | Admitting: Physical Therapy

## 2023-05-19 DIAGNOSIS — R600 Localized edema: Secondary | ICD-10-CM | POA: Diagnosis not present

## 2023-05-19 DIAGNOSIS — M6281 Muscle weakness (generalized): Secondary | ICD-10-CM

## 2023-05-19 DIAGNOSIS — M25521 Pain in right elbow: Secondary | ICD-10-CM

## 2023-05-19 DIAGNOSIS — M25621 Stiffness of right elbow, not elsewhere classified: Secondary | ICD-10-CM

## 2023-05-19 NOTE — Therapy (Signed)
OUTPATIENT PHYSICAL THERAPY TREATMENT    Patient Name: Mark Miranda MRN: [id number] DOB:02-13-1971, 52 y.o., male Today's Date: 05/20/2023  END OF SESSION:  PT End of Session - 05/19/23 0940     Visit Number 18    Number of Visits 25    Date for PT Re-Evaluation 06/03/23    Authorization Type Workers Comp, Managed MCD    PT Start Time 0935    PT Stop Time 1017    PT Time Calculation (min) 42 min    Activity Tolerance Patient tolerated treatment well    Behavior During Therapy WFL for tasks assessed/performed                          Past Medical History:  Diagnosis Date   BK viremia    Closed fracture of right elbow 03/04/2023   Depression    Diabetic peripheral neuropathy (HCC) 11/24/2015   End stage renal disease (HCC)    Erectile dysfunction    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   Hypercholesterolemia    Hypertension    Metabolic acidosis    Migraines    Recurrent peritonitis (HCC)    Type 1 diabetes with renal manifestations, controlled (HCC)    Vitamin D deficiency    Past Surgical History:  Procedure Laterality Date   AV FISTULA PLACEMENT  01/10/2012   Procedure: ARTERIOVENOUS (AV) FISTULA CREATION;  Surgeon: Sherren Kerns, MD;  Location: [facility] OR;  Service: Vascular;  Laterality: Left;  Left Brachial Cephalic Arteriovenous Fistula   CAPD INSERTION  01/09/2012   Procedure: CONTINUOUS AMBULATORY PERITONEAL DIALYSIS  (CAPD) CATHETER INSERTION;  Surgeon: Ernestene Mention, MD;  Location: [facility] OR;  Service: General;  Laterality: N/A;  Exteriorization PD Cath   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   received left kidney, 3 renal arteries, 1 renal vein, ureter and pancreas from 52 year old   INSERTION OF DIALYSIS CATHETER  01/13/2012   Procedure: INSERTION OF DIALYSIS CATHETER;  Surgeon: Larina Earthly, MD;  Location: [facility] OR;  Service: Vascular;  Laterality: Right;  Internal Jugular   LAPAROSCOPY   03/13/2012   Procedure: LAPAROSCOPY DIAGNOSTIC;  Surgeon: Ardeth Sportsman, MD;  Location: [facility] OR;  Service: General;  Laterality: N/A;   LIVER BIOPSY  04/02/2013   open   ORIF HUMERUS FRACTURE Right 03/07/2023   Procedure: OPEN REDUCTION INTERNAL FIXATION (ORIF) DISTAL HUMERUS FRACTURE;  Surgeon: Huel Cote, MD;  Location: [facility] ORS;  Service: Orthopedics;  Laterality: Right;   PERITONEAL CATHETER INSERTION     PERITONEAL CATHETER REMOVAL     SMALL INTESTINE SURGERY  04/02/2013   TRICEPS TENDON REPAIR  03/07/2023   Procedure: TRICEPS TENDON REPAIR;  Surgeon: Huel Cote, MD;  Location: [facility] ORS;  Service: Orthopedics;;   ULNAR NERVE TRANSPOSITION Right 03/07/2023   Procedure: ULNAR NERVE EXPLORATION;  Surgeon: Huel Cote, MD;  Location: [facility] ORS;  Service: Orthopedics;  Laterality: Right;   Patient Active Problem List   Diagnosis Date Noted   Closed fracture of right distal humerus 03/07/2023   Closed bicondylar fracture of distal end of right humerus 03/04/2023   Type 2 diabetes mellitus with diabetic chronic kidney disease (HCC) 07/22/2022   Depression, major, 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/2022   Erectile dysfunction 07/24/2017   Vitamin D deficiency, unspecified 07/24/2017   Low back pain 02/25/2016   Chronic fatigue 01/25/2016   Diabetic peripheral neuropathy (HCC) 11/24/2015   History of simultaneous kidney and pancreas  transplant (HCC) 11/02/2015   Depression 11/02/2015   Tobacco abuse 11/02/2015   End stage renal disease (HCC) 12/29/2011   HTN (hypertension) 01/25/2011    REFERRING PROVIDER: Steward Drone, MD  REFERRING DIAG: S42.491A (ICD-10-CM) - Other closed displaced fracture of distal end of right humerus, initial encounter  Post op distal humeral ORIF, ulnar nerve neuroplasty, triceps tendon repair Rationale for Evaluation and Treatment: Rehabilitation  THERAPY DIAG:  Stiffness of right elbow, not elsewhere classified  Pain in right elbow  Muscle weakness  (generalized)  ONSET DATE: DOS 03/07/23   SUBJECTIVE:                                                                                                                                                                                           SUBJECTIVE STATEMENT:  Pt is 10 weeks and 3 days post op.  Pt uses his brace at home to try and stretch his arm.  Pt has been working on his ROM and thinks it is improving some.  Pt reports he is able to reach his mouth better with R hand including feeding himself and brushing teeth.  Pt reports having soreness after Rx.      PERTINENT HISTORY:  Vit D deficiency, h/o ESRD & kidney, pancreas transplant  PAIN:  NPRS:  3/10 current, 5/10 worst, 2/10 best Location:  post elbow  PRECAUTIONS:  None  RED FLAGS: None   WEIGHT BEARING RESTRICTIONS:  Yes NWB  FALLS:  Has patient fallen in last 6 months? Yes. 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  PATIENT GOALS:  Painting- steady hand, back to normal   OBJECTIVE:   HAND DOMINANCE:  Right    TODAY'S TREATMENT   UPPER EXTREMITY ROM:   Active ROM Right eval Right 8/30  Right 9/6 (PROM) Right 9/25 (PROM) Right 9/27 (PROM) 10/4 10/9 10/11  Shoulder flexion 120          Shoulder extension            Shoulder abduction            Shoulder adduction            Shoulder extension            Shoulder internal rotation            Shoulder external rotation            Elbow flexion 80 96  98 113 115   125 AROM  Elbow extension -30 -29  -33 -28 -20 AROM/PROM:  30/21 deg AROM/PROM:  30/23 deg AROM/PROM:  28/19  Wrist flexion            Wrist extension            Wrist ulnar deviation            Wrist radial deviation            Wrist pronation            Wrist supination             (Blank rows = not tested)                                                                                                                                PROM for R elbow flexion/extension with forearm neutral and supinated Supine static elbow extension stretch 1x2 min Pulleys flexion x77min, abd Supine SA punch approx 15 with assistance at elbow for form, 1# 2x10 with assistance at elbow for form Supine ABC x1 Supine active elbow flexion/extension 3x10 Prone row 3x10 Prone horizontal abduction 2x10 Supine shoulder rhythmic stabs at 90 deg with proximal taps 3x30 sec Forearm pronation/supination 2x10   PATIENT EDUCATION:  Education details: Anatomy of condition, POC, HEP, exercise form/rationale, rationale of interventions, and objective findings.     Person educated: Patient Education method: Explanation, Demonstration, Tactile cues, Verbal cues Education comprehension: verbalized understanding, returned demonstration, verbal cues required, tactile cues required, and needs further education  HOME EXERCISE PROGRAM: [email or web address]   ASSESSMENT:  CLINICAL IMPRESSION:   Pt reports improved functional mobility with R UE including being able to reach his mouth to feed himself and brush teeth.  He continues to have stiffness and limited elbow extension ROM.  Pt tolerated extension PROM well.  PT assessed elbow ROM today.  Pt demonstrates a good improvement in elbow flexion AROM and minimal improvement in extension AROM and PROM.  Pt demonstrates improved form with exercises though did require assistance with maintaining correct form with supine serratus punch.  Pt responded well to Rx and did have increased pain from 3/10 before Rx to 4/10 after Rx.  Pt should benefit from cont skilled PT services per protocol to improve ROM and stiffness, address goals, and improve function.    OBJECTIVE IMPAIRMENTS: decreased activity tolerance, decreased ROM, decreased strength, increased edema, impaired flexibility, impaired UE functional use, improper body mechanics, postural dysfunction, and pain.   ACTIVITY LIMITATIONS: carrying, lifting,  sleeping, bathing, dressing, and reach over head  PARTICIPATION LIMITATIONS: meal prep, cleaning, laundry, shopping, community activity, occupation, and yard work  PERSONAL FACTORS:  see PMH  are also affecting patient's functional outcome.   REHAB POTENTIAL: Good  CLINICAL DECISION MAKING: Stable/uncomplicated  EVALUATION COMPLEXITY: Low   GOALS: Goals reviewed with patient? No  SHORT TERM GOALS: Target date: 8/24  Elbow extension to  neutral Baseline: see obj Goal status: ONGOING  2.  Able to make full fist Baseline: unable due to edema at eval Goal status:  MET 9/18  LONG TERM GOALS: Target date: POC date  Strength 80% of opp UE via hand held dynamometry testing Baseline: not appropriate to test at eval Goal status: INITIAL  2.  Necessary ROM avail for all ADLs Baseline: very limited at eval Goal status: IN PROGRESS 10/2  3.  UEFI to increase to at least 70/80 Baseline: see obj Goal status: ONGOING  4.  Able to perform long arc UE motions with weight without incr pain Baseline: will be necessary for painting.  Goal status: INITIAL     PLAN:  PT FREQUENCY: 2x/wk  PT DURATION: 7 weeks  PLANNED INTERVENTIONS: Therapeutic exercises, Therapeutic activity, Neuromuscular re-education, Patient/Family education, Self Care, Joint mobilization, Aquatic Therapy, Dry Needling, Electrical stimulation, Spinal mobilization, Cryotherapy, Moist heat, scar mobilization, Taping, Vasopneumatic device, Ultrasound, Ionotophoresis 4mg /ml Dexamethasone, Manual therapy, and Re-evaluation.  PLAN FOR NEXT SESSION:  Cont with ROM and ther ex per protocol.     Audie Clear III PT, DPT 05/20/23 7:33 AM

## 2023-05-20 ENCOUNTER — Other Ambulatory Visit (HOSPITAL_BASED_OUTPATIENT_CLINIC_OR_DEPARTMENT_OTHER): Payer: Self-pay

## 2023-05-20 ENCOUNTER — Encounter (HOSPITAL_BASED_OUTPATIENT_CLINIC_OR_DEPARTMENT_OTHER): Payer: Self-pay | Admitting: Physical Therapy

## 2023-05-24 ENCOUNTER — Encounter (HOSPITAL_BASED_OUTPATIENT_CLINIC_OR_DEPARTMENT_OTHER): Payer: Self-pay

## 2023-05-24 ENCOUNTER — Ambulatory Visit (HOSPITAL_BASED_OUTPATIENT_CLINIC_OR_DEPARTMENT_OTHER): Payer: Worker's Compensation

## 2023-05-24 DIAGNOSIS — R600 Localized edema: Secondary | ICD-10-CM

## 2023-05-24 DIAGNOSIS — M25521 Pain in right elbow: Secondary | ICD-10-CM

## 2023-05-24 DIAGNOSIS — M6281 Muscle weakness (generalized): Secondary | ICD-10-CM

## 2023-05-24 DIAGNOSIS — M25621 Stiffness of right elbow, not elsewhere classified: Secondary | ICD-10-CM

## 2023-05-24 NOTE — Therapy (Signed)
OUTPATIENT PHYSICAL THERAPY TREATMENT    Patient Name: Mark Miranda MRN: [id number] DOB:[birth year], 52 y.o., male Today's Date: 05/24/2023  END OF SESSION:  PT End of Session - 05/24/23 1353     Visit Number 19    Number of Visits 25    Date for PT Re-Evaluation 06/03/23    Authorization Type Workers Comp, Managed MCD    PT Start Time 1352    PT Stop Time 1430    PT Time Calculation (min) 38 min    Activity Tolerance Patient tolerated treatment well    Behavior During Therapy WFL for tasks assessed/performed                           Past Medical History:  Diagnosis Date   BK viremia    Closed fracture of right elbow 03/04/2023   Depression    Diabetic peripheral neuropathy (HCC) 11/24/2015   End stage renal disease (HCC)    Erectile dysfunction    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   Hypercholesterolemia    Hypertension    Metabolic acidosis    Migraines    Recurrent peritonitis (HCC)    Type 1 diabetes with renal manifestations, controlled (HCC)    Vitamin D deficiency    Past Surgical History:  Procedure Laterality Date   AV FISTULA PLACEMENT  01/10/2012   Procedure: ARTERIOVENOUS (AV) FISTULA CREATION;  Surgeon: Sherren Kerns, MD;  Location: [facility] OR;  Service: Vascular;  Laterality: Left;  Left Brachial Cephalic Arteriovenous Fistula   CAPD INSERTION  01/09/2012   Procedure: CONTINUOUS AMBULATORY PERITONEAL DIALYSIS  (CAPD) CATHETER INSERTION;  Surgeon: Ernestene Mention, MD;  Location: [facility] OR;  Service: General;  Laterality: N/A;  Exteriorization PD Cath   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   received left kidney, 3 renal arteries, 1 renal vein, ureter and pancreas from 52 year old   INSERTION OF DIALYSIS CATHETER  01/13/2012   Procedure: INSERTION OF DIALYSIS CATHETER;  Surgeon: Larina Earthly, MD;  Location: [facility] OR;  Service: Vascular;  Laterality: Right;  Internal Jugular   LAPAROSCOPY   03/13/2012   Procedure: LAPAROSCOPY DIAGNOSTIC;  Surgeon: Ardeth Sportsman, MD;  Location: [facility] OR;  Service: General;  Laterality: N/A;   LIVER BIOPSY  04/02/2013   open   ORIF HUMERUS FRACTURE Right 03/07/2023   Procedure: OPEN REDUCTION INTERNAL FIXATION (ORIF) DISTAL HUMERUS FRACTURE;  Surgeon: Huel Cote, MD;  Location: [facility] ORS;  Service: Orthopedics;  Laterality: Right;   PERITONEAL CATHETER INSERTION     PERITONEAL CATHETER REMOVAL     SMALL INTESTINE SURGERY  04/02/2013   TRICEPS TENDON REPAIR  03/07/2023   Procedure: TRICEPS TENDON REPAIR;  Surgeon: Huel Cote, MD;  Location: [facility] ORS;  Service: Orthopedics;;   ULNAR NERVE TRANSPOSITION Right 03/07/2023   Procedure: ULNAR NERVE EXPLORATION;  Surgeon: Huel Cote, MD;  Location: [facility] ORS;  Service: Orthopedics;  Laterality: Right;   Patient Active Problem List   Diagnosis Date Noted   Closed fracture of right distal humerus 03/07/2023   Closed bicondylar fracture of distal end of right humerus 03/04/2023   Type 2 diabetes mellitus with diabetic chronic kidney disease (HCC) 07/22/2022   Depression, major, 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/2022   Erectile dysfunction 07/24/2017   Vitamin D deficiency, unspecified 07/24/2017   Low back pain 02/25/2016   Chronic fatigue 01/25/2016   Diabetic peripheral neuropathy (HCC) 11/24/2015   History of simultaneous kidney and  pancreas transplant (HCC) 11/02/2015   Depression 11/02/2015   Tobacco abuse 11/02/2015   End stage renal disease (HCC) 12/29/2011   HTN (hypertension) 01/25/2011    REFERRING PROVIDER: Steward Drone, MD  REFERRING DIAG: S42.491A (ICD-10-CM) - Other closed displaced fracture of distal end of right humerus, initial encounter  Post op distal humeral ORIF, ulnar nerve neuroplasty, triceps tendon repair Rationale for Evaluation and Treatment: Rehabilitation  THERAPY DIAG:  Stiffness of right elbow, not elsewhere classified  Pain in right elbow  Muscle weakness  (generalized)  Localized edema  ONSET DATE: DOS 03/07/23   SUBJECTIVE:                                                                                                                                                                                           SUBJECTIVE STATEMENT:  Pt is 11 weeks s/p. He reports soreness yesterday after stretching elbow using brace for a long period of time.    PERTINENT HISTORY:  Vit D deficiency, h/o ESRD & kidney, pancreas transplant  PAIN:  NPRS:  3/10 current, 5/10 worst, 2/10 best Location:  post elbow  PRECAUTIONS:  None  RED FLAGS: None   WEIGHT BEARING RESTRICTIONS:  Yes NWB  FALLS:  Has patient fallen in last 6 months? Yes. 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  PATIENT GOALS:  Painting- steady hand, back to normal   OBJECTIVE:   HAND DOMINANCE:  Right      UPPER EXTREMITY ROM:   Active ROM Right eval Right 8/30  Right 9/6 (PROM) Right 9/25 (PROM) Right 9/27 (PROM) 10/4 10/9 10/11  Shoulder flexion 120          Shoulder extension            Shoulder abduction            Shoulder adduction            Shoulder extension            Shoulder internal rotation            Shoulder external rotation            Elbow flexion 80 96  98 113 115   125 AROM  Elbow extension -30 -29  -33 -28 -20 AROM/PROM:  30/21 deg AROM/PROM:  30/23 deg AROM/PROM:  28/19  Wrist flexion            Wrist extension            Wrist ulnar deviation            Wrist radial deviation  Wrist pronation            Wrist supination             (Blank rows = not tested)                                                                                                                             TODAY'S TREATMENT  PROM for R elbow flexion/extension with forearm neutral and supinated  Pulleys flexion x61min, abd Standing active elbow flexion/extension 2x10 Standing  active shoulder flexion Standing active shoulder abduction 2x10  Prone row 3x10 Prone horizontal abduction 2x10 Prone shoulder flexion 2x10 Prone shoulder extension 2x10 Triceps isometrics self resisted (50%) Forearm pronation/supination 2x15   PATIENT EDUCATION:  Education details: Anatomy of condition, POC, HEP, exercise form/rationale, rationale of interventions, and objective findings.     Person educated: Patient Education method: Explanation, Demonstration, Tactile cues, Verbal cues Education comprehension: verbalized understanding, returned demonstration, verbal cues required, tactile cues required, and needs further education  HOME EXERCISE PROGRAM: [id number]   ASSESSMENT:  CLINICAL IMPRESSION:  Pt continues to remain limited in end range elbow flexion and extension. Some discomfort at end ranges with overpressure. He also continues to experience crepitus in elbow with passive extension. Able to initiate self resisted triceps isometric strengthening today with good tolerance. Cued pt not to exceed 50% exertion. He may add this to HEP if pain free. Will continue to progress with protocol within pain limitations as pt approaches 12 weeks s/p.     OBJECTIVE IMPAIRMENTS: decreased activity tolerance, decreased ROM, decreased strength, increased edema, impaired flexibility, impaired UE functional use, improper body mechanics, postural dysfunction, and pain.   ACTIVITY LIMITATIONS: carrying, lifting, sleeping, bathing, dressing, and reach over head  PARTICIPATION LIMITATIONS: meal prep, cleaning, laundry, shopping, community activity, occupation, and yard work  PERSONAL FACTORS:  see PMH  are also affecting patient's functional outcome.   REHAB POTENTIAL: Good  CLINICAL DECISION MAKING: Stable/uncomplicated  EVALUATION COMPLEXITY: Low   GOALS: Goals reviewed with patient? No  SHORT TERM GOALS: Target date: 8/24  Elbow extension to  neutral Baseline: see obj Goal  status: ONGOING  2.  Able to make full fist Baseline: unable due to edema at eval Goal status:  MET 9/18    LONG TERM GOALS: Target date: POC date  Strength 80% of opp UE via hand held dynamometry testing Baseline: not appropriate to test at eval Goal status: INITIAL  2.  Necessary ROM avail for all ADLs Baseline: very limited at eval Goal status: IN PROGRESS 10/2  3.  UEFI to increase to at least 70/80 Baseline: see obj Goal status: ONGOING  4.  Able to perform long arc UE motions with weight without incr pain Baseline: will be necessary for painting.  Goal status: INITIAL     PLAN:  PT FREQUENCY: 2x/wk  PT DURATION: 7 weeks  PLANNED INTERVENTIONS: Therapeutic exercises, Therapeutic activity, Neuromuscular re-education, Patient/Family education, Self Care, Joint mobilization,  Aquatic Therapy, Dry Needling, Electrical stimulation, Spinal mobilization, Cryotherapy, Moist heat, scar mobilization, Taping, Vasopneumatic device, Ultrasound, Ionotophoresis 4mg /ml Dexamethasone, Manual therapy, and Re-evaluation.  PLAN FOR NEXT SESSION:  Cont with ROM and ther ex per protocol.     Riki Altes, PTA  05/24/23 2:40 PM

## 2023-05-26 ENCOUNTER — Ambulatory Visit (HOSPITAL_BASED_OUTPATIENT_CLINIC_OR_DEPARTMENT_OTHER): Payer: Worker's Compensation | Admitting: Physical Therapy

## 2023-05-26 DIAGNOSIS — M6281 Muscle weakness (generalized): Secondary | ICD-10-CM

## 2023-05-26 DIAGNOSIS — M25521 Pain in right elbow: Secondary | ICD-10-CM

## 2023-05-26 DIAGNOSIS — R600 Localized edema: Secondary | ICD-10-CM | POA: Diagnosis not present

## 2023-05-26 DIAGNOSIS — M25621 Stiffness of right elbow, not elsewhere classified: Secondary | ICD-10-CM

## 2023-05-26 NOTE — Therapy (Incomplete)
OUTPATIENT PHYSICAL THERAPY TREATMENT / PROGRESS NOTE  Progress Note Reporting Period 04/14/2023 to 05/26/2023  See note below for Objective Data and Assessment of Progress/Goals.       Patient Name: Mark Miranda MRN: [id number] DOB:01/27/1971, 52 y.o., male Today's Date: 05/27/2023  END OF SESSION:  PT End of Session - 05/26/23 1010     Visit Number 20    Number of Visits 36    Date for PT Re-Evaluation 07/21/23    Authorization Type Workers Comp, Managed MCD    PT Start Time [phone]    PT Stop Time 1024    PT Time Calculation (min) 42 min    Activity Tolerance Patient tolerated treatment well    Behavior During Therapy WFL for tasks assessed/performed                            Past Medical History:  Diagnosis Date   BK viremia    Closed fracture of right elbow 03/04/2023   Depression    Diabetic peripheral neuropathy (HCC) 11/24/2015   End stage renal disease (HCC)    Erectile dysfunction    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   Hypercholesterolemia    Hypertension    Metabolic acidosis    Migraines    Recurrent peritonitis (HCC)    Type 1 diabetes with renal manifestations, controlled (HCC)    Vitamin D deficiency    Past Surgical History:  Procedure Laterality Date   AV FISTULA PLACEMENT  01/10/2012   Procedure: ARTERIOVENOUS (AV) FISTULA CREATION;  Surgeon: Sherren Kerns, MD;  Location: [facility] OR;  Service: Vascular;  Laterality: Left;  Left Brachial Cephalic Arteriovenous Fistula   CAPD INSERTION  01/09/2012   Procedure: CONTINUOUS AMBULATORY PERITONEAL DIALYSIS  (CAPD) CATHETER INSERTION;  Surgeon: Ernestene Mention, MD;  Location: [facility] OR;  Service: General;  Laterality: N/A;  Exteriorization PD Cath   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   received left kidney, 3 renal arteries, 1 renal vein, ureter and pancreas from 52 year old   INSERTION OF DIALYSIS CATHETER  01/13/2012   Procedure:  INSERTION OF DIALYSIS CATHETER;  Surgeon: Larina Earthly, MD;  Location: [facility] OR;  Service: Vascular;  Laterality: Right;  Internal Jugular   LAPAROSCOPY  03/13/2012   Procedure: LAPAROSCOPY DIAGNOSTIC;  Surgeon: Ardeth Sportsman, MD;  Location: [facility] OR;  Service: General;  Laterality: N/A;   LIVER BIOPSY  04/02/2013   open   ORIF HUMERUS FRACTURE Right 03/07/2023   Procedure: OPEN REDUCTION INTERNAL FIXATION (ORIF) DISTAL HUMERUS FRACTURE;  Surgeon: Huel Cote, MD;  Location: [facility] ORS;  Service: Orthopedics;  Laterality: Right;   PERITONEAL CATHETER INSERTION     PERITONEAL CATHETER REMOVAL     SMALL INTESTINE SURGERY  04/02/2013   TRICEPS TENDON REPAIR  03/07/2023   Procedure: TRICEPS TENDON REPAIR;  Surgeon: Huel Cote, MD;  Location: [facility] ORS;  Service: Orthopedics;;   ULNAR NERVE TRANSPOSITION Right 03/07/2023   Procedure: ULNAR NERVE EXPLORATION;  Surgeon: Huel Cote, MD;  Location: [facility] ORS;  Service: Orthopedics;  Laterality: Right;   Patient Active Problem List   Diagnosis Date Noted   Closed fracture of right distal humerus 03/07/2023   Closed bicondylar fracture of distal end of right humerus 03/04/2023   Type 2 diabetes mellitus with diabetic chronic kidney disease (HCC) 07/22/2022   Depression, major, 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/2022   Erectile dysfunction 07/24/2017   Vitamin D deficiency, unspecified  07/24/2017   Low back pain 02/25/2016   Chronic fatigue 01/25/2016   Diabetic peripheral neuropathy (HCC) 11/24/2015   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   Depression 11/02/2015   Tobacco abuse 11/02/2015   End stage renal disease (HCC) 12/29/2011   HTN (hypertension) 01/25/2011    REFERRING PROVIDER: Huel Cote MD  REFERRING DIAG: [phone] (ICD-10-CM) - Other closed displaced fracture of distal end of right humerus, initial encounter  Post op distal humeral ORIF, ulnar nerve neuroplasty, triceps tendon  repair Rationale for Evaluation and Treatment: Rehabilitation  THERAPY DIAG:  Stiffness of right elbow, not elsewhere classified  Pain in right elbow  Muscle weakness (generalized)  ONSET DATE: DOS 03/07/23   SUBJECTIVE:                                                                                                                                                                                           SUBJECTIVE STATEMENT:  Pt is 11 weeks and 3 days post op.  Pt reports he avoids activities that increase his pain including lifting objects.  Pt reports having some increased pain after Rx.  He is using the brace to work on stretching, but has taken a break from using that brace due to overdoing it recently.   Pt able to move hand closer to his face and wash his hair.  Pt reports he feels stronger.  He is limited with ROM.  Pt has pain with washing dishes.  Pt tried to dry his car some with RUE though had pain and primarily use his L UE.  He is unable to perform work activities.  Pt is limited with lifting.     PERTINENT HISTORY:  Vit D deficiency, h/o ESRD & kidney, pancreas transplant  PAIN:  NPRS:  3/10 current, 3/10 worst, 2/10 best Location:  post elbow  PRECAUTIONS:  None  RED FLAGS: None   WEIGHT BEARING RESTRICTIONS:  Yes NWB  FALLS:  Has patient fallen in last 6 months? Yes. 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  PATIENT GOALS:  Painting- steady hand, back to normal   OBJECTIVE:   HAND DOMINANCE:  Right      UPPER EXTREMITY ROM:   Active ROM Right eval Right 8/30  Right 9/6 (PROM) Right 9/25 (PROM) Right 9/27 (PROM) 10/4 10/9 10/11 10/18  Shoulder flexion 120         AROM R:  152 L:  169  Shoulder extension             Shoulder abduction  Shoulder adduction             Shoulder extension             Shoulder internal rotation             Shoulder external rotation              Elbow flexion 80 96  98 113 115   125 AROM 131 AROM  Elbow extension -30 -29  -33 -28 -20 AROM/PROM:  30/21 deg AROM/PROM:  30/23 deg AROM/PROM:  28/19 AROM/PROM:  29/23  Wrist flexion             Wrist extension             Wrist ulnar deviation             Wrist radial deviation             Wrist pronation           WFL  Wrist supination           WNL   (Blank rows = not tested)                                                                                                                             TODAY'S TREATMENT  PROM for R elbow flexion/extension with forearm neutral and supinated  Pulleys flexion x41min, scaption Forearm pronation/supination 2x15 Supine/Standing active elbow flexion/extension 2x10 / 1x10 Prone row 2x15 Prone horizontal abduction 2x15 Prone shoulder extension x10, 4 reps Submax gentle tricep isometrics self resisted with 5 sec hold x 10 reps (with cuing for no > 50%)  UEFI:  Prior: 8/80, Current: 15/80    PATIENT EDUCATION:  Education details: Anatomy of condition, POC, HEP, exercise form/rationale, rationale of interventions, and objective findings.     Person educated: Patient Education method: Explanation, Demonstration, Tactile cues, Verbal cues Education comprehension: verbalized understanding, returned demonstration, verbal cues required, tactile cues required, and needs further education  HOME EXERCISE PROGRAM: [id number]   ASSESSMENT:  CLINICAL IMPRESSION:  Pt continues to have significant tightness and limitation with elbow extension ROM.  Pt has discomfort at extension end range with PROM.  He has made good progress with elbow flexion ROM and demonstrates good forearm pronation and supination.  He is improving with function as evidenced by subjective reports.  He reports being able to move hand closer to his face and wash his hair.  He has limitations with performing ADLs/IADLs and is unable to perform work activities.  He has  expected limitations with lifting.  Pt is progressing well with exercises per protocol.  He responded well to Rx and should benefit from cont skilled PT services per protocol for improved ROM and strength and to assist in restoring desired level of function.     OBJECTIVE IMPAIRMENTS: decreased activity tolerance, decreased ROM, decreased strength, increased edema, impaired flexibility, impaired UE functional use, improper body mechanics, postural dysfunction, and pain.  ACTIVITY LIMITATIONS: carrying, lifting, sleeping, bathing, dressing, and reach over head  PARTICIPATION LIMITATIONS: meal prep, cleaning, laundry, shopping, community activity, occupation, and yard work  PERSONAL FACTORS:  see PMH  are also affecting patient's functional outcome.   REHAB POTENTIAL: Good  CLINICAL DECISION MAKING: Stable/uncomplicated  EVALUATION COMPLEXITY: Low   GOALS: Goals reviewed with patient? No  SHORT TERM GOALS: Target date: 8/24  Elbow extension to  neutral Baseline: see obj Goal status: ONGOING  2.  Able to make full fist Baseline: unable due to edema at eval Goal status:  MET 9/18  3.  Pt will demo improved R elbow extension AROM/PROM to 15/10 deg for improved stiffness and mobility and performance of self care activities.   Goal Status:  INITIAL  Target date:  06/23/2023     LONG TERM GOALS: Target date: POC date  Strength 80% of opp UE via hand held dynamometry testing Baseline: not appropriate to test at eval Goal status: INITIAL  2.  Necessary ROM avail for all ADLs Baseline: very limited at eval Goal status: IN PROGRESS 10/18  3.  UEFI to increase to at least 70/80 Baseline: see obj Goal status: progressing  10/18  4.  Able to perform long arc UE motions with weight without incr pain Baseline: will be necessary for painting.  Goal status: INITIAL     PLAN:  PT FREQUENCY: 2x/wk  PT DURATION: 8 weeks  PLANNED INTERVENTIONS: Therapeutic exercises,  Therapeutic activity, Neuromuscular re-education, Patient/Family education, Self Care, Joint mobilization, Aquatic Therapy, Dry Needling, Electrical stimulation, Spinal mobilization, Cryotherapy, Moist heat, scar mobilization, Taping, Vasopneumatic device, Ultrasound, Ionotophoresis 4mg /ml Dexamethasone, Manual therapy, and Re-evaluation.  PLAN FOR NEXT SESSION:  Cont with ROM and ther ex per protocol.     Audie Clear III PT, DPT 05/27/23 11:12 AM

## 2023-05-27 ENCOUNTER — Encounter (HOSPITAL_BASED_OUTPATIENT_CLINIC_OR_DEPARTMENT_OTHER): Payer: Self-pay | Admitting: Physical Therapy

## 2023-05-31 ENCOUNTER — Ambulatory Visit (HOSPITAL_BASED_OUTPATIENT_CLINIC_OR_DEPARTMENT_OTHER): Payer: Worker's Compensation | Admitting: Physical Therapy

## 2023-05-31 DIAGNOSIS — M25621 Stiffness of right elbow, not elsewhere classified: Secondary | ICD-10-CM

## 2023-05-31 DIAGNOSIS — M6281 Muscle weakness (generalized): Secondary | ICD-10-CM

## 2023-05-31 DIAGNOSIS — R600 Localized edema: Secondary | ICD-10-CM | POA: Diagnosis not present

## 2023-05-31 DIAGNOSIS — M25521 Pain in right elbow: Secondary | ICD-10-CM

## 2023-05-31 NOTE — Therapy (Signed)
OUTPATIENT PHYSICAL THERAPY TREATMENT / PROGRESS NOTE  Progress Note Reporting Period 04/14/2023 to 05/26/2023  See note below for Objective Data and Assessment of Progress/Goals.       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   Hypercholesterolemia    Hypertension    Metabolic acidosis    Migraines    Recurrent peritonitis (HCC)    Type 1 diabetes with renal manifestations, controlled (HCC)    Vitamin D deficiency    Past Surgical History:  Procedure Laterality Date   AV FISTULA PLACEMENT  01/10/2012   Procedure: ARTERIOVENOUS (AV) FISTULA CREATION;  Surgeon: Sherren Kerns, MD;  Location: [facility] OR;  Service: Vascular;  Laterality: Left;  Left Brachial Cephalic Arteriovenous Fistula   CAPD INSERTION  01/09/2012   Procedure: CONTINUOUS AMBULATORY PERITONEAL DIALYSIS  (CAPD) CATHETER INSERTION;  Surgeon: Ernestene Mention, MD;  Location: [facility] OR;  Service: General;  Laterality: N/A;  Exteriorization PD Cath   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   received left kidney, 3 renal arteries, 1 renal vein, ureter and pancreas from 52 year old   INSERTION OF DIALYSIS CATHETER  01/13/2012   Procedure: INSERTION OF DIALYSIS CATHETER;  Surgeon: Larina Earthly, MD;  Location: [facility] OR;  Service: Vascular;  Laterality: Right;  Internal Jugular   LAPAROSCOPY  03/13/2012   Procedure: LAPAROSCOPY DIAGNOSTIC;  Surgeon: Ardeth Sportsman, MD;  Location: [facility] OR;  Service: General;  Laterality: N/A;   LIVER BIOPSY  04/02/2013   open   ORIF HUMERUS FRACTURE Right 03/07/2023   Procedure: OPEN  REDUCTION INTERNAL FIXATION (ORIF) DISTAL HUMERUS FRACTURE;  Surgeon: Huel Cote, MD;  Location: [facility] ORS;  Service: Orthopedics;  Laterality: Right;   PERITONEAL CATHETER INSERTION     PERITONEAL CATHETER REMOVAL     SMALL INTESTINE SURGERY  04/02/2013   TRICEPS TENDON REPAIR  03/07/2023   Procedure: TRICEPS TENDON REPAIR;  Surgeon: Huel Cote, MD;  Location: [facility] ORS;  Service: Orthopedics;;   ULNAR NERVE TRANSPOSITION Right 03/07/2023   Procedure: ULNAR NERVE EXPLORATION;  Surgeon: Huel Cote, MD;  Location: [facility] ORS;  Service: Orthopedics;  Laterality: Right;   Patient Active Problem List   Diagnosis Date Noted   Closed fracture of right distal humerus 03/07/2023   Closed bicondylar fracture of distal end of right humerus 03/04/2023   Type 2 diabetes mellitus with diabetic chronic kidney disease (HCC) 07/22/2022   Depression, major, 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/2022   Erectile dysfunction 07/24/2017   Vitamin D deficiency, unspecified 07/24/2017   Low back pain 02/25/2016   Chronic fatigue 01/25/2016   Diabetic peripheral neuropathy (HCC) 11/24/2015   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   Depression 11/02/2015   Tobacco abuse 11/02/2015   End stage renal disease (HCC) 12/29/2011   HTN (hypertension) 01/25/2011    REFERRING PROVIDER: Huel Cote MD  REFERRING DIAG: [phone] (ICD-10-CM) - Other closed displaced fracture of distal end of right humerus, initial encounter  Post op distal humeral ORIF, ulnar nerve neuroplasty, triceps tendon repair  Rationale for Evaluation and Treatment: Rehabilitation  THERAPY DIAG:  No diagnosis found.  ONSET DATE: DOS 03/07/23   SUBJECTIVE:                                                                                                                                                                                           SUBJECTIVE STATEMENT:  Pt is 12 weeks and 1 day post op.  Pt  reports he avoids activities that increase his pain including lifting objects.  Pt reports having a little more pain after prior Rx.  He is using the brace to work on stretching.  Pt able to move hand closer to his face and wash his hair.  Pt reports he feels stronger.  He is limited with ROM.  Pt states it's uncomfortable using R UE with washing dishes.  He is unable to perform work activities.  Pt is limited with lifting.     PERTINENT HISTORY:  Vit D deficiency, h/o ESRD & kidney, pancreas transplant  PAIN:  NPRS:  3/10 current, 3/10 worst, 2/10 best Location:  post elbow  PRECAUTIONS:  None  RED FLAGS: None   WEIGHT BEARING RESTRICTIONS:  Yes NWB  FALLS:  Has patient fallen in last 6 months? Yes. 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  PATIENT GOALS:  Painting- steady hand, back to normal   OBJECTIVE:   HAND DOMINANCE:  Right      UPPER EXTREMITY ROM:   Active ROM Right eval Right 8/30  Right 9/6 (PROM) Right 9/25 (PROM) Right 9/27 (PROM) 10/4 10/9 10/11 10/18 10/23   Shoulder flexion 120         AROM R:  152 L:  169   Shoulder extension              Shoulder abduction              Shoulder adduction              Shoulder extension              Shoulder internal rotation              Shoulder external rotation              Elbow flexion 80 96  98 113 115   125 AROM 131 AROM   Elbow extension -30 -29  -33 -28 -20 AROM/PROM:  30/21 deg AROM/PROM:  30/23 deg AROM/PROM:  28/19 AROM/PROM:  29/23 AROM:  28 deg  Wrist flexion  Wrist extension              Wrist ulnar deviation              Wrist radial deviation              Wrist pronation           WFL   Wrist supination           WNL    (Blank rows = not tested)                                                                                                                             TODAY'S TREATMENT  PROM for R elbow  flexion/extension with forearm supinated  Pulleys flexion x47min, abduction Standing active elbow flexion/extension 2x10  Supine static elbow extension stretch 2x2 min Prone row 2x15 Prone horizontal abduction 3x10 Prone shoulder extension 3x10 Submax tricep isometrics self resisted with 5 sec hold x 10 reps (25 - 50%) Supine shoulder ABC x 1 rep Supine rhythmic stab's at 90 deg flexion x30 sec proximally and 2x30 sec distally S/L ER 2x15     PATIENT EDUCATION:  Education details: Anatomy of condition, POC, HEP, exercise form/rationale, rationale of interventions, and objective findings.     Person educated: Patient Education method: Explanation, Demonstration, Tactile cues, Verbal cues Education comprehension: verbalized understanding, returned demonstration, verbal cues required, tactile cues required, and needs further education  HOME EXERCISE PROGRAM: [id number]   ASSESSMENT:  CLINICAL IMPRESSION:  Pt continues to have significant tightness and limitation with elbow extension ROM.  Pt has discomfort at extension end range with PROM.  He has made good progress with elbow flexion ROM and demonstrates good forearm pronation and supination.  He is improving with function as evidenced by subjective reports.  He reports being able to move hand closer to his face and wash his hair.  He has limitations with performing ADLs/IADLs and is unable to perform work activities.  He has expected limitations with lifting.  Pt is progressing well with exercises per protocol.  He responded well to Rx and should benefit from cont skilled PT services per protocol for improved ROM and strength and to assist in restoring desired level of function.  Pt attempted supin shouler ABC with 1# though had pain.  PT removed weight and felt better.   Pt had pain with elbow extension PROM.    OBJECTIVE IMPAIRMENTS: decreased activity tolerance, decreased ROM, decreased strength, increased edema, impaired  flexibility, impaired UE functional use, improper body mechanics, postural dysfunction, and pain.   ACTIVITY LIMITATIONS: carrying, lifting, sleeping, bathing, dressing, and reach over head  PARTICIPATION LIMITATIONS: meal prep, cleaning, laundry, shopping, community activity, occupation, and yard work  PERSONAL FACTORS:  see PMH  are also affecting patient's functional outcome.   REHAB POTENTIAL: Good  CLINICAL DECISION MAKING: Stable/uncomplicated  EVALUATION COMPLEXITY: Low   GOALS: Goals reviewed with patient? No  SHORT TERM GOALS: Target  date: 8/24  Elbow extension to  neutral Baseline: see obj Goal status: ONGOING  2.  Able to make full fist Baseline: unable due to edema at eval Goal status:  MET 9/18  3.  Pt will demo improved R elbow extension AROM/PROM to 15/10 deg for improved stiffness and mobility and performance of self care activities.   Goal Status:  INITIAL  Target date:  06/23/2023     LONG TERM GOALS: Target date: POC date  Strength 80% of opp UE via hand held dynamometry testing Baseline: not appropriate to test at eval Goal status: INITIAL  2.  Necessary ROM avail for all ADLs Baseline: very limited at eval Goal status: IN PROGRESS 10/18  3.  UEFI to increase to at least 70/80 Baseline: see obj Goal status: progressing  10/18  4.  Able to perform long arc UE motions with weight without incr pain Baseline: will be necessary for painting.  Goal status: INITIAL     PLAN:  PT FREQUENCY: 2x/wk  PT DURATION: 8 weeks  PLANNED INTERVENTIONS: Therapeutic exercises, Therapeutic activity, Neuromuscular re-education, Patient/Family education, Self Care, Joint mobilization, Aquatic Therapy, Dry Needling, Electrical stimulation, Spinal mobilization, Cryotherapy, Moist heat, scar mobilization, Taping, Vasopneumatic device, Ultrasound, Ionotophoresis 4mg /ml Dexamethasone, Manual therapy, and Re-evaluation.  PLAN FOR NEXT SESSION:  Cont with ROM and  ther ex per protocol.     Audie Clear III PT, DPT 05/31/23 9:43 AM

## 2023-06-01 ENCOUNTER — Encounter (HOSPITAL_BASED_OUTPATIENT_CLINIC_OR_DEPARTMENT_OTHER): Payer: Self-pay | Admitting: Physical Therapy

## 2023-06-02 ENCOUNTER — Ambulatory Visit (HOSPITAL_BASED_OUTPATIENT_CLINIC_OR_DEPARTMENT_OTHER): Payer: Worker's Compensation | Admitting: Physical Therapy

## 2023-06-02 ENCOUNTER — Ambulatory Visit (HOSPITAL_BASED_OUTPATIENT_CLINIC_OR_DEPARTMENT_OTHER): Payer: Medicaid Other | Admitting: Orthopaedic Surgery

## 2023-06-02 ENCOUNTER — Other Ambulatory Visit (HOSPITAL_BASED_OUTPATIENT_CLINIC_OR_DEPARTMENT_OTHER): Payer: Self-pay | Admitting: Orthopaedic Surgery

## 2023-06-02 ENCOUNTER — Ambulatory Visit (HOSPITAL_BASED_OUTPATIENT_CLINIC_OR_DEPARTMENT_OTHER)
Admission: RE | Admit: 2023-06-02 | Discharge: 2023-06-02 | Disposition: A | Discharge status: Home / Self Care | Payer: Worker's Compensation | Source: Ambulatory Visit | Attending: Orthopaedic Surgery | Admitting: Orthopaedic Surgery

## 2023-06-02 DIAGNOSIS — S42491A Other displaced fracture of lower end of right humerus, initial encounter for closed fracture: Secondary | ICD-10-CM

## 2023-06-02 DIAGNOSIS — M25521 Pain in right elbow: Secondary | ICD-10-CM

## 2023-06-02 DIAGNOSIS — M25621 Stiffness of right elbow, not elsewhere classified: Secondary | ICD-10-CM

## 2023-06-02 DIAGNOSIS — M6281 Muscle weakness (generalized): Secondary | ICD-10-CM

## 2023-06-02 MED ORDER — LIDOCAINE HCL 1 % IJ SOLN
4.0000 mL | INTRAMUSCULAR | Status: AC | PRN
  Administered 2023-06-02: 4 mL

## 2023-06-02 MED ORDER — TRIAMCINOLONE ACETONIDE 40 MG/ML IJ SUSP
80.0000 mg | INTRAMUSCULAR | Status: AC | PRN
  Administered 2023-06-02: 80 mg via INTRA_ARTICULAR

## 2023-06-02 NOTE — Therapy (Signed)
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   Hypercholesterolemia    Hypertension    Metabolic acidosis    Migraines    Recurrent peritonitis (HCC)    Type 1 diabetes with renal manifestations, controlled (HCC)    Vitamin D deficiency    Past Surgical History:  Procedure Laterality Date   AV FISTULA PLACEMENT  01/10/2012   Procedure: ARTERIOVENOUS (AV) FISTULA CREATION;  Surgeon: Sherren Kerns, MD;  Location: [facility] OR;  Service: Vascular;  Laterality: Left;  Left Brachial Cephalic Arteriovenous Fistula   CAPD INSERTION  01/09/2012   Procedure: CONTINUOUS AMBULATORY PERITONEAL DIALYSIS  (CAPD) CATHETER INSERTION;  Surgeon: Ernestene Mention, MD;  Location: [facility] OR;  Service: General;  Laterality: N/A;  Exteriorization PD Cath   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   received left kidney, 3 renal arteries, 1 renal vein, ureter and pancreas from 52 year old   INSERTION OF DIALYSIS CATHETER  01/13/2012   Procedure: INSERTION OF DIALYSIS CATHETER;  Surgeon: Larina Earthly, MD;  Location: [facility] OR;  Service: Vascular;  Laterality: Right;  Internal Jugular   LAPAROSCOPY  03/13/2012   Procedure: LAPAROSCOPY DIAGNOSTIC;  Surgeon: Ardeth Sportsman, MD;  Location: [facility] OR;  Service: General;  Laterality: N/A;   LIVER BIOPSY  04/02/2013   open   ORIF HUMERUS FRACTURE Right 03/07/2023   Procedure: OPEN REDUCTION INTERNAL FIXATION (ORIF) DISTAL HUMERUS FRACTURE;  Surgeon:  Huel Cote, MD;  Location: [facility] ORS;  Service: Orthopedics;  Laterality: Right;   PERITONEAL CATHETER INSERTION     PERITONEAL CATHETER REMOVAL     SMALL INTESTINE SURGERY  04/02/2013   TRICEPS TENDON REPAIR  03/07/2023   Procedure: TRICEPS TENDON REPAIR;  Surgeon: Huel Cote, MD;  Location: [facility] ORS;  Service: Orthopedics;;   ULNAR NERVE TRANSPOSITION Right 03/07/2023   Procedure: ULNAR NERVE EXPLORATION;  Surgeon: Huel Cote, MD;  Location: [facility] ORS;  Service: Orthopedics;  Laterality: Right;   Patient Active Problem List   Diagnosis Date Noted   Closed fracture of right distal humerus 03/07/2023   Closed bicondylar fracture of distal end of right humerus 03/04/2023   Type 2 diabetes mellitus with diabetic chronic kidney disease (HCC) 07/22/2022   Depression, major, 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/2022   Erectile dysfunction 07/24/2017   Vitamin D deficiency, unspecified 07/24/2017   Low back pain 02/25/2016   Chronic fatigue 01/25/2016   Diabetic peripheral neuropathy (HCC) 11/24/2015   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   Depression 11/02/2015   Tobacco abuse 11/02/2015   End stage renal disease (HCC) 12/29/2011   HTN (hypertension) 01/25/2011    REFERRING PROVIDER: Huel Cote MD  REFERRING DIAG: [phone] (ICD-10-CM) - Other closed displaced fracture of distal end of right humerus, initial encounter  Post op distal humeral ORIF, ulnar nerve  neuroplasty, triceps tendon repair Rationale for Evaluation and Treatment: Rehabilitation  THERAPY DIAG:  Stiffness of right elbow, not elsewhere classified  Pain in right elbow  Muscle weakness (generalized)  ONSET DATE: DOS 03/07/23   SUBJECTIVE:                                                                                                                                                                                           SUBJECTIVE STATEMENT:  Pt saw MD this AM and  received an injection.  MD wrote a note for pt to be out of work for 6 weeks.  Pt reports having soreness after prior Rx.     PERTINENT HISTORY:  Vit D deficiency, h/o ESRD & kidney, pancreas transplant  PAIN:  NPRS:  2/10 current, 3/10 worst, 2/10 best Location:  post elbow  PRECAUTIONS:  None  RED FLAGS: None   WEIGHT BEARING RESTRICTIONS:  Yes NWB  FALLS:  Has patient fallen in last 6 months? Yes. 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       PATIENT EDUCATION:  Education details:  POC.     Person educated: Patient Education method: Explanation Education comprehension:  verbalized understanding,  HOME EXERCISE PROGRAM: [id number]   ASSESSMENT:  CLINICAL IMPRESSION:  Pt saw MD this AM and received an elbow injection.  PT deferred treatment today due to pt just receiving an injection prior to PT appointment.  Pt agreed with deferring treatment today.  PT spoke with pt about scheduling and worked on scheduling future appointments.      OBJECTIVE IMPAIRMENTS: decreased activity tolerance, decreased ROM, decreased strength, increased edema, impaired flexibility, impaired UE functional use, improper body mechanics, postural dysfunction, and  pain.   ACTIVITY LIMITATIONS: carrying, lifting, sleeping, bathing, dressing, and reach over head  PARTICIPATION LIMITATIONS: meal prep, cleaning, laundry, shopping, community activity, occupation, and yard work  PERSONAL FACTORS:  see PMH  are also affecting patient's functional outcome.   REHAB POTENTIAL: Good  CLINICAL DECISION MAKING: Stable/uncomplicated  EVALUATION COMPLEXITY:  Low   GOALS: Goals reviewed with patient? No  SHORT TERM GOALS: Target date: 8/24  Elbow extension to  neutral Baseline: see obj Goal status: ONGOING  2.  Able to make full fist Baseline: unable due to edema at eval Goal status:  MET 9/18  3.  Pt will demo improved R elbow extension AROM/PROM to 15/10 deg for improved stiffness and mobility and performance of self care activities.   Goal Status:  INITIAL  Target date:  06/23/2023     LONG TERM GOALS: Target date: POC date  Strength 80% of opp UE via hand held dynamometry testing Baseline: not appropriate to test at eval Goal status: INITIAL  2.  Necessary ROM avail for all ADLs Baseline: very limited at eval Goal status: IN PROGRESS 10/18  3.  UEFI to increase to at least 70/80 Baseline: see obj Goal status: progressing  10/18  4.  Able to perform long arc UE motions with weight without incr pain Baseline: will be necessary for painting.  Goal status: INITIAL     PLAN:  PT FREQUENCY: 2x/wk  PT DURATION: 8 weeks  PLANNED INTERVENTIONS: Therapeutic exercises, Therapeutic activity, Neuromuscular re-education, Patient/Family education, Self Care, Joint mobilization, Aquatic Therapy, Dry Needling, Electrical stimulation, Spinal mobilization, Cryotherapy, Moist heat, scar mobilization, Taping, Vasopneumatic device, Ultrasound, Ionotophoresis 4mg /ml Dexamethasone, Manual therapy, and Re-evaluation.  PLAN FOR NEXT SESSION:  Cont with ROM and ther ex per protocol.  Focus on improving extension ROM   Audie Clear III PT, DPT 06/03/23 5:43 PM

## 2023-06-02 NOTE — Progress Notes (Signed)
Post Operative Evaluation    Procedure/Date of Surgery: Status post right distal humerus open reduction internal fixation 7/30  Interval History:   Presents today for follow-up of his right elbow.  At this time he is still continuing to diligently work through physical therapy on range of motion.  He is still experiencing soreness about the elbow.  PMH/PSH/Family History/Social History/Meds/Allergies:    Past Medical History:  Diagnosis Date  . BK viremia   . Closed fracture of right elbow 03/04/2023  . Depression   . Diabetic peripheral neuropathy (HCC) 11/24/2015  . End stage renal disease (HCC)   . Erectile dysfunction   . 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 Hypercholesterolemia   . Hypertension   . Metabolic acidosis   . Migraines   . Recurrent peritonitis (HCC)   . Type 1 diabetes with renal manifestations, controlled (HCC)   . Vitamin D deficiency    Past Surgical History:  Procedure Laterality Date  . AV FISTULA PLACEMENT  01/10/2012   Procedure: ARTERIOVENOUS (AV) FISTULA CREATION;  Surgeon: Sherren Kerns, MD;  Location: [facility] OR;  Service: Vascular;  Laterality: Left;  Left Brachial Cephalic Arteriovenous Fistula  . CAPD INSERTION  01/09/2012   Procedure: CONTINUOUS AMBULATORY PERITONEAL DIALYSIS  (CAPD) CATHETER INSERTION;  Surgeon: Ernestene Mention, MD;  Location: [facility] OR;  Service: General;  Laterality: N/A;  Exteriorization PD Cath  . 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 INSERTION OF DIALYSIS CATHETER  01/13/2012   Procedure: INSERTION OF DIALYSIS CATHETER;  Surgeon: Larina Earthly, MD;  Location: [facility] OR;  Service: Vascular;  Laterality: Right;  Internal Jugular  . LAPAROSCOPY  03/13/2012   Procedure: LAPAROSCOPY DIAGNOSTIC;  Surgeon: Ardeth Sportsman, MD;  Location: [facility] OR;  Service:  General;  Laterality: N/A;  . LIVER BIOPSY  04/02/2013   open  . ORIF HUMERUS FRACTURE Right 03/07/2023   Procedure: OPEN REDUCTION INTERNAL FIXATION (ORIF) DISTAL HUMERUS FRACTURE;  Surgeon: Huel Cote, MD;  Location: [facility] ORS;  Service: Orthopedics;  Laterality: Right;  . PERITONEAL CATHETER INSERTION    . PERITONEAL CATHETER REMOVAL    . SMALL INTESTINE SURGERY  04/02/2013  . TRICEPS TENDON REPAIR  03/07/2023   Procedure: TRICEPS TENDON REPAIR;  Surgeon: Huel Cote, MD;  Location: [facility] ORS;  Service: Orthopedics;;  . ULNAR NERVE TRANSPOSITION Right 03/07/2023   Procedure: ULNAR NERVE EXPLORATION;  Surgeon: Huel Cote, MD;  Location: [facility] ORS;  Service: Orthopedics;  Laterality: Right;   Social History   Socioeconomic History  . Marital status: Married    Spouse name: Mark Miranda  . Number of children: 4  . Years of education: 10  . Highest education level: Not on file  Occupational History  . Occupation: Painter  Tobacco Use  . Smoking status: Every Day    Current packs/day: 1.00    Average packs/day: 1 pack/day for 22.0 years (22.0 ttl pk-yrs)    Types: Cigarettes  . Smokeless tobacco: Never  Vaping Use  . Vaping status: Every Day  . Substances: Nicotine  Substance and Sexual Activity  . Alcohol use: Yes    Alcohol/week: 0.0 standard drinks of alcohol    Comment: socially  . Drug use:  No  . Sexual activity: Yes  Other Topics Concern  . Not on file  Social History Narrative   Paints houses   Wife works at a Retail banker at [employer]   4 children age 68 to 9   Regular exercise-no current exercise regimen   Caffeine Use-no   Social Determinants of Health   Financial Resource Strain: Not on file  Food Insecurity: Not on file  Transportation Needs: Not on file  Physical Activity: Not on file  Stress: Not on file  Social Connections: Not on file   Family History  Problem Relation Age of Onset  . Diabetes Mother   . Hypertension Mother   . Hyperlipidemia  Mother   . Cancer Father        glioblastoma   No Known Allergies Current Outpatient Medications  Medication Sig Dispense Refill  . aspirin EC 325 MG tablet Take 1 tablet (325 mg total) by mouth daily. 14 tablet 0  . dapagliflozin propanediol (FARXIGA) 10 MG TABS tablet TAKE 1 TABLET BY MOUTH DAILY BEFORE BREAKFAST. 90 tablet 0  . HYDROcodone-acetaminophen (NORCO/VICODIN) 5-325 MG tablet Take 1 tablet by mouth every 6 (six) hours as needed for moderate pain. 20 tablet 0  . lisinopril (ZESTRIL) 10 MG tablet Take 1 tablet (10 mg total) by mouth daily. 90 tablet 0  . meloxicam (MOBIC) 15 MG tablet Take 1 tablet (15 mg total) by mouth daily. 30 tablet 0  . mycophenolate (MYFORTIC) 180 MG EC tablet Take 4 tablets by mouth 2 (two) times daily.    Marland Kitchen oxyCODONE (ROXICODONE) 5 MG immediate release tablet Take 1 tablet (5 mg total) by mouth every 4 (four) hours as needed for severe pain or breakthrough pain. 20 tablet 0  . oxyCODONE (ROXICODONE) 5 MG immediate release tablet Take 1 tablet (5 mg total) by mouth every 4 (four) hours as needed for severe pain or breakthrough pain. 20 tablet 0  . sildenafil (VIAGRA) 100 MG tablet Take 0.5-1 tablets (50-100 mg total) by mouth daily as needed for erectile dysfunction. 15 tablet 6  . sitaGLIPtin (JANUVIA) 50 MG tablet Take 50 mg by mouth daily.    . tacrolimus (PROGRAF) 1 MG capsule Take 5 mg by mouth 2 (two) times daily. Pt take 5 mg in the morning and 4 mg at night    . traMADol (ULTRAM) 50 MG tablet Take 1 tablet (50 mg total) by mouth every 6 (six) hours as needed. 30 tablet 1  . traMADol (ULTRAM) 50 MG tablet Take 1 tablet (50 mg total) by mouth every 6 (six) hours as needed. 30 tablet 0   No current facility-administered medications for this visit.   No results found.  Review of Systems:   A ROS was performed including pertinent positives and negatives as documented in the HPI.   Musculoskeletal Exam:    There were no vitals taken for this  visit.  Right incision is well-appearing which is subsequently healed..  Range of motion is from 15 degrees to 140 degrees.  Sensation is intact to light touch throughout.  Fires ulnar interosseous nerve distribution as well as radial nerve with strong wrist extension and 2+ radial pulse  Imaging:    2 views right humerus: Status post open reduction internal fixation without evidence of hardware complication, there is callus formation  I personally reviewed and interpreted the radiographs.   Assessment:   3 months status post right distal humeral open reduction internal fixation overall doing well.  There is some interval healing  at this time.  At this time I would like him to continue working on aggressive range of motion for extension as he still does have an extension deficit.  I recommended ultrasound-guided injection of the elbow at today's visit in order to get him some improved motion and pain.  I will plan to see him back in 6 weeks for reassessment.  At this time he will remain out of work as he still does have pain and soreness about the elbow that I do not believe he would be ready to work through a full day at this point  Plan :    -Return to clinic in 6 weeks for reassessment and motion check     Procedure Note  Patient: Mark Miranda             Date of Birth: February 12, 1971           MRN: [id number]             Visit Date: 06/02/2023  Procedures: Visit Diagnoses:  1. Other closed displaced fracture of distal end of right humerus, initial encounter     Medium Joint Inj: R elbow on 06/02/2023 9:16 AM Indications: pain Details: 22 G 1.5 in needle, ultrasound-guided lateral approach Medications: 4 mL lidocaine 1 %; 80 mg triamcinolone acetonide 40 MG/ML Outcome: tolerated well, no immediate complications Consent was given by the patient. Immediately prior to procedure a time out was called to verify the correct patient, procedure, equipment, support staff and site/side  marked as required. Patient was prepped and draped in the usual sterile fashion.        I personally saw and evaluated the patient, and participated in the management and treatment plan.  Huel Cote, MD Attending Physician, Orthopedic Surgery  This document was dictated using Dragon voice recognition software. A reasonable attempt at proof reading has been made to minimize errors.

## 2023-06-03 ENCOUNTER — Encounter (HOSPITAL_BASED_OUTPATIENT_CLINIC_OR_DEPARTMENT_OTHER): Payer: Self-pay | Admitting: Physical Therapy

## 2023-06-05 ENCOUNTER — Other Ambulatory Visit: Payer: Self-pay

## 2023-06-07 ENCOUNTER — Ambulatory Visit (HOSPITAL_BASED_OUTPATIENT_CLINIC_OR_DEPARTMENT_OTHER): Payer: Worker's Compensation | Admitting: Physical Therapy

## 2023-06-07 DIAGNOSIS — M6281 Muscle weakness (generalized): Secondary | ICD-10-CM

## 2023-06-07 DIAGNOSIS — R600 Localized edema: Secondary | ICD-10-CM | POA: Diagnosis not present

## 2023-06-07 DIAGNOSIS — M25521 Pain in right elbow: Secondary | ICD-10-CM

## 2023-06-07 DIAGNOSIS — M25621 Stiffness of right elbow, not elsewhere classified: Secondary | ICD-10-CM

## 2023-06-07 NOTE — Therapy (Signed)
OUTPATIENT PHYSICAL THERAPY TREATMENT       Patient Name: Mark Miranda MRN: [id number] DOB:06-23-1971, 52 y.o., male Today's Date: 06/08/2023  END OF SESSION:  PT End of Session - 06/07/23 1031     Visit Number 22    Number of Visits 36    Date for PT Re-Evaluation 07/21/23    Authorization Type Workers Comp, Managed MCD    PT Start Time [phone]    PT Stop Time 1022    PT Time Calculation (min) 45 min    Activity Tolerance Patient tolerated treatment well    Behavior During Therapy WFL for tasks assessed/performed                              Past Medical History:  Diagnosis Date   BK viremia    Closed fracture of right elbow 03/04/2023   Depression    Diabetic peripheral neuropathy (HCC) 11/24/2015   End stage renal disease (HCC)    Erectile dysfunction    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   Hypercholesterolemia    Hypertension    Metabolic acidosis    Migraines    Recurrent peritonitis (HCC)    Type 1 diabetes with renal manifestations, controlled (HCC)    Vitamin D deficiency    Past Surgical History:  Procedure Laterality Date   AV FISTULA PLACEMENT  01/10/2012   Procedure: ARTERIOVENOUS (AV) FISTULA CREATION;  Surgeon: Sherren Kerns, MD;  Location: [facility] OR;  Service: Vascular;  Laterality: Left;  Left Brachial Cephalic Arteriovenous Fistula   CAPD INSERTION  01/09/2012   Procedure: CONTINUOUS AMBULATORY PERITONEAL DIALYSIS  (CAPD) CATHETER INSERTION;  Surgeon: Ernestene Mention, MD;  Location: [facility] OR;  Service: General;  Laterality: N/A;  Exteriorization PD Cath   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   received left kidney, 3 renal arteries, 1 renal vein, ureter and pancreas from 52 year old   INSERTION OF DIALYSIS CATHETER  01/13/2012   Procedure: INSERTION OF DIALYSIS CATHETER;  Surgeon: Larina Earthly, MD;  Location: [facility] OR;  Service: Vascular;  Laterality: Right;  Internal Jugular    LAPAROSCOPY  03/13/2012   Procedure: LAPAROSCOPY DIAGNOSTIC;  Surgeon: Ardeth Sportsman, MD;  Location: [facility] OR;  Service: General;  Laterality: N/A;   LIVER BIOPSY  04/02/2013   open   ORIF HUMERUS FRACTURE Right 03/07/2023   Procedure: OPEN REDUCTION INTERNAL FIXATION (ORIF) DISTAL HUMERUS FRACTURE;  Surgeon: Huel Cote, MD;  Location: [facility] ORS;  Service: Orthopedics;  Laterality: Right;   PERITONEAL CATHETER INSERTION     PERITONEAL CATHETER REMOVAL     SMALL INTESTINE SURGERY  04/02/2013   TRICEPS TENDON REPAIR  03/07/2023   Procedure: TRICEPS TENDON REPAIR;  Surgeon: Huel Cote, MD;  Location: [facility] ORS;  Service: Orthopedics;;   ULNAR NERVE TRANSPOSITION Right 03/07/2023   Procedure: ULNAR NERVE EXPLORATION;  Surgeon: Huel Cote, MD;  Location: [facility] ORS;  Service: Orthopedics;  Laterality: Right;   Patient Active Problem List   Diagnosis Date Noted   Closed fracture of right distal humerus 03/07/2023   Closed bicondylar fracture of distal end of right humerus 03/04/2023   Type 2 diabetes mellitus with diabetic chronic kidney disease (HCC) 07/22/2022   Depression, major, 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/2022   Erectile dysfunction 07/24/2017   Vitamin D deficiency, unspecified 07/24/2017   Low back pain 02/25/2016   Chronic fatigue 01/25/2016   Diabetic peripheral neuropathy (HCC) 11/24/2015  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   Depression 11/02/2015   Tobacco abuse 11/02/2015   End stage renal disease (HCC) 12/29/2011   HTN (hypertension) 01/25/2011    REFERRING PROVIDER: Huel Cote MD  REFERRING DIAG: [phone] (ICD-10-CM) - Other closed displaced fracture of distal end of right humerus, initial encounter  Post op distal humeral ORIF, ulnar nerve neuroplasty, triceps tendon repair Rationale for Evaluation and Treatment: Rehabilitation  THERAPY DIAG:  Stiffness of right elbow, not elsewhere classified  Pain in right  elbow  Muscle weakness (generalized)  ONSET DATE: DOS 03/07/23   SUBJECTIVE:                                                                                                                                                                                           SUBJECTIVE STATEMENT:  Pt is 13 weeks and 1 day post op.  Pt had an injection in R elbow on Friday and reports no improvement in pain or sx's at this time.  He states it feels the same.  Pt has noticed more discomfort in the muscles around the elbow.   PERTINENT HISTORY:  Vit D deficiency, h/o ESRD & kidney, pancreas transplant  PAIN:  NPRS:  2/10 current, 3/10 worst, 2/10 best Location:  post elbow  PRECAUTIONS:  None  RED FLAGS: None   WEIGHT BEARING RESTRICTIONS:  Yes NWB  FALLS:  Has patient fallen in last 6 months? Yes. 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  PATIENT GOALS:  Painting- steady hand, back to normal   OBJECTIVE:   HAND DOMINANCE:  Right                                                                                                                               TODAY'S TREATMENT  PROM for R elbow flexion/extension with forearm supinated and neutral Pt received gentle R elbow extension stretch in supine  Pulleys flexion x50min, abduction Standing active elbow flexion/extension 2x10  Supine static elbow extension stretch x2.5 min  Prone row 2x15 Prone horizontal abduction 2x10 (1/10 shoulder pain) Prone shoulder extension 2x15  (2/10 pain) Submax tricep isometrics self resisted with 5 sec hold x 10 reps (25 - 50%) Supine shoulder ABC x 1 rep Supine rhythmic stab's at 90 deg flexion 2x30 sec proximally and x30 sec distally S/L ER 3x15     PATIENT EDUCATION:  Education details: Anatomy of condition, POC, HEP, exercise form/rationale, rationale of interventions, and ROM findings.     Person educated: Patient Education  method: Explanation, Demonstration, Tactile cues, Verbal cues Education comprehension: verbalized understanding, returned demonstration, verbal cues required, tactile cues required, and needs further education  HOME EXERCISE PROGRAM: [id number]   ASSESSMENT:  CLINICAL IMPRESSION:  Pt continues to have significant tightness and limitation with elbow extension ROM.  Pt has pain with end range elbow extension PROM.  Pt seemed to have more c/o's of discomfort with exercises today.  Pt reports having some pain with elbow popping/grinding with mobility.  PT instructed pt to inform MD about those sx's.  Pt has visible and palpable triceps contraction with submax isometrics.  He reports minimally increased pain from 2/10 before Rx to 2.5-3/10 after Rx.  Pt should benefit from cont skilled PT services per protocol to address ongoing goals and impairments and to assist in restoring desired level of function.   OBJECTIVE IMPAIRMENTS: decreased activity tolerance, decreased ROM, decreased strength, increased edema, impaired flexibility, impaired UE functional use, improper body mechanics, postural dysfunction, and pain.   ACTIVITY LIMITATIONS: carrying, lifting, sleeping, bathing, dressing, and reach over head  PARTICIPATION LIMITATIONS: meal prep, cleaning, laundry, shopping, community activity, occupation, and yard work  PERSONAL FACTORS:  see PMH  are also affecting patient's functional outcome.   REHAB POTENTIAL: Good  CLINICAL DECISION MAKING: Stable/uncomplicated  EVALUATION COMPLEXITY: Low   GOALS: Goals reviewed with patient? No  SHORT TERM GOALS: Target date: 8/24  Elbow extension to  neutral Baseline: see obj Goal status: ONGOING  2.  Able to make full fist Baseline: unable due to edema at eval Goal status:  MET 9/18  3.  Pt will demo improved R elbow extension AROM/PROM to 15/10 deg for improved stiffness and mobility and performance of self care activities.   Goal Status:   INITIAL  Target date:  06/23/2023     LONG TERM GOALS: Target date: POC date  Strength 80% of opp UE via hand held dynamometry testing Baseline: not appropriate to test at eval Goal status: INITIAL  2.  Necessary ROM avail for all ADLs Baseline: very limited at eval Goal status: IN PROGRESS 10/18  3.  UEFI to increase to at least 70/80 Baseline: see obj Goal status: progressing  10/18  4.  Able to perform long arc UE motions with weight without incr pain Baseline: will be necessary for painting.  Goal status: INITIAL     PLAN:  PT FREQUENCY: 2x/wk  PT DURATION: 8 weeks  PLANNED INTERVENTIONS: Therapeutic exercises, Therapeutic activity, Neuromuscular re-education, Patient/Family education, Self Care, Joint mobilization, Aquatic Therapy, Dry Needling, Electrical stimulation, Spinal mobilization, Cryotherapy, Moist heat, scar mobilization, Taping, Vasopneumatic device, Ultrasound, Ionotophoresis 4mg /ml Dexamethasone, Manual therapy, and Re-evaluation.  PLAN FOR NEXT SESSION:  Cont with ROM and ther ex per protocol.  Focus on elbow extension ROM.   Audie Clear III PT, DPT 06/08/23 2:49 PM

## 2023-06-08 ENCOUNTER — Encounter (HOSPITAL_BASED_OUTPATIENT_CLINIC_OR_DEPARTMENT_OTHER): Payer: Self-pay | Admitting: Physical Therapy

## 2023-06-10 ENCOUNTER — Other Ambulatory Visit: Payer: Self-pay | Admitting: Family Medicine

## 2023-06-11 NOTE — Therapy (Signed)
OUTPATIENT PHYSICAL THERAPY TREATMENT       Patient Name: Mark Miranda MRN: [id number] DOB:May 26, 1971, 52 y.o., male Today's Date: 06/12/2023  END OF SESSION:  PT End of Session - 06/12/23 1028     Visit Number 23    Number of Visits 36    Date for PT Re-Evaluation 07/21/23    Authorization Type Workers Comp, Managed MCD    PT Start Time [phone]    PT Stop Time 1030    PT Time Calculation (min) 41 min    Activity Tolerance Patient tolerated treatment well    Behavior During Therapy WFL for tasks assessed/performed                               Past Medical History:  Diagnosis Date   BK viremia    Closed fracture of right elbow 03/04/2023   Depression    Diabetic peripheral neuropathy (HCC) 11/24/2015   End stage renal disease (HCC)    Erectile dysfunction    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   Hypercholesterolemia    Hypertension    Metabolic acidosis    Migraines    Recurrent peritonitis (HCC)    Type 1 diabetes with renal manifestations, controlled (HCC)    Vitamin D deficiency    Past Surgical History:  Procedure Laterality Date   AV FISTULA PLACEMENT  01/10/2012   Procedure: ARTERIOVENOUS (AV) FISTULA CREATION;  Surgeon: Sherren Kerns, MD;  Location: [facility] OR;  Service: Vascular;  Laterality: Left;  Left Brachial Cephalic Arteriovenous Fistula   CAPD INSERTION  01/09/2012   Procedure: CONTINUOUS AMBULATORY PERITONEAL DIALYSIS  (CAPD) CATHETER INSERTION;  Surgeon: Ernestene Mention, MD;  Location: [facility] OR;  Service: General;  Laterality: N/A;  Exteriorization PD Cath   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   received left kidney, 3 renal arteries, 1 renal vein, ureter and pancreas from 52 year old   INSERTION OF DIALYSIS CATHETER  01/13/2012   Procedure: INSERTION OF DIALYSIS CATHETER;  Surgeon: Larina Earthly, MD;  Location: [facility] OR;  Service: Vascular;  Laterality: Right;  Internal Jugular    LAPAROSCOPY  03/13/2012   Procedure: LAPAROSCOPY DIAGNOSTIC;  Surgeon: Ardeth Sportsman, MD;  Location: [facility] OR;  Service: General;  Laterality: N/A;   LIVER BIOPSY  04/02/2013   open   ORIF HUMERUS FRACTURE Right 03/07/2023   Procedure: OPEN REDUCTION INTERNAL FIXATION (ORIF) DISTAL HUMERUS FRACTURE;  Surgeon: Huel Cote, MD;  Location: [facility] ORS;  Service: Orthopedics;  Laterality: Right;   PERITONEAL CATHETER INSERTION     PERITONEAL CATHETER REMOVAL     SMALL INTESTINE SURGERY  04/02/2013   TRICEPS TENDON REPAIR  03/07/2023   Procedure: TRICEPS TENDON REPAIR;  Surgeon: Huel Cote, MD;  Location: [facility] ORS;  Service: Orthopedics;;   ULNAR NERVE TRANSPOSITION Right 03/07/2023   Procedure: ULNAR NERVE EXPLORATION;  Surgeon: Huel Cote, MD;  Location: [facility] ORS;  Service: Orthopedics;  Laterality: Right;   Patient Active Problem List   Diagnosis Date Noted   Closed fracture of right distal humerus 03/07/2023   Closed bicondylar fracture of distal end of right humerus 03/04/2023   Type 2 diabetes mellitus with diabetic chronic kidney disease (HCC) 07/22/2022   Depression, major, 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/2022   Erectile dysfunction 07/24/2017   Vitamin D deficiency, unspecified 07/24/2017   Low back pain 02/25/2016   Chronic fatigue 01/25/2016   Diabetic peripheral neuropathy (HCC) 11/24/2015  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   Depression 11/02/2015   Tobacco abuse 11/02/2015   End stage renal disease (HCC) 12/29/2011   HTN (hypertension) 01/25/2011    REFERRING PROVIDER: Huel Cote MD  REFERRING DIAG: [phone] (ICD-10-CM) - Other closed displaced fracture of distal end of right humerus, initial encounter  Post op distal humeral ORIF, ulnar nerve neuroplasty, triceps tendon repair Rationale for Evaluation and Treatment: Rehabilitation  THERAPY DIAG:  Stiffness of right elbow, not elsewhere classified  Pain in right  elbow  Muscle weakness (generalized)  ONSET DATE: DOS 03/07/23   SUBJECTIVE:                                                                                                                                                                                           SUBJECTIVE STATEMENT:  Pt is 13 weeks and 6 days post op.  Pt continues to have pain.  He states it feels worse since having the injection.  Pt states it feels more uncomfortable at the elbow.  Pt states he had a little more pain and soreness after prior treatment.   PERTINENT HISTORY:  Vit D deficiency, h/o ESRD & kidney, pancreas transplant  PAIN:  NPRS:  3/10 current, 3/10 worst, 2/10 best Location:  post elbow  PRECAUTIONS:  None  RED FLAGS: None   WEIGHT BEARING RESTRICTIONS:  Yes NWB  FALLS:  Has patient fallen in last 6 months? Yes. 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  PATIENT GOALS:  Painting- steady hand, back to normal   OBJECTIVE:   HAND DOMINANCE:  Right                                                                                                                               TODAY'S TREATMENT  R elbow AROM:  32 - 135 deg R elbow extension AROM (after PROM):  28 deg R elbow extension AROM/PROM (after stretching):  27/18  PROM for R elbow flexion/extension with forearm supinated and neutral Pt received  gentle R elbow extension stretch in supine 4x20 sec  Pulleys flexion x55min, abduction Standing active elbow flexion/extension x 15, 10 in neutral and x10 in supination Supine static elbow extension stretch x 2 min with 1# ankle weight around wrist Prone row 2x15 Prone shoulder extension 2x15  Supine rhythmic stab's at 90 deg flexion x30 sec proximally and x30 sec distally      PATIENT EDUCATION:  Education details: Anatomy of condition, POC, HEP, exercise form/rationale, rationale of interventions, and ROM findings.      Person educated: Patient Education method: Explanation, Demonstration, Tactile cues, Verbal cues Education comprehension: verbalized understanding, returned demonstration, verbal cues required, tactile cues required, and needs further education  HOME EXERCISE PROGRAM: [id number]   ASSESSMENT:  CLINICAL IMPRESSION:  Pt continues to have pain and states his elbow is not improving including with pain and ROM.  He continues to have significant tightness and limitation with elbow extension ROM.  Pt has pain with end range elbow extension PROM.  PT focused on improving extension ROM today.  Pt demonstrated a 5 deg improvement with extension AROM after PROM and stretching.  He demonstrated a slight improvement in extension PROM.  Pt reports having some popping/grinding in lateral elbow with elbow AROM which PT can feel at times.  He reports pain with this and PT instructed him to inform MD.  Pt reports 3/10 pain at beginning of Rx and after Rx.  Pt should benefit from cont skilled PT services per protocol to address ongoing goals and impairments and to assist in restoring desired level of function.    OBJECTIVE IMPAIRMENTS: decreased activity tolerance, decreased ROM, decreased strength, increased edema, impaired flexibility, impaired UE functional use, improper body mechanics, postural dysfunction, and pain.   ACTIVITY LIMITATIONS: carrying, lifting, sleeping, bathing, dressing, and reach over head  PARTICIPATION LIMITATIONS: meal prep, cleaning, laundry, shopping, community activity, occupation, and yard work  PERSONAL FACTORS:  see PMH  are also affecting patient's functional outcome.   REHAB POTENTIAL: Good  CLINICAL DECISION MAKING: Stable/uncomplicated  EVALUATION COMPLEXITY: Low   GOALS: Goals reviewed with patient? No  SHORT TERM GOALS: Target date: 8/24  Elbow extension to  neutral Baseline: see obj Goal status: ONGOING  2.  Able to make full fist Baseline: unable due to  edema at eval Goal status:  MET 9/18  3.  Pt will demo improved R elbow extension AROM/PROM to 15/10 deg for improved stiffness and mobility and performance of self care activities.   Goal Status:  INITIAL  Target date:  06/23/2023     LONG TERM GOALS: Target date: POC date  Strength 80% of opp UE via hand held dynamometry testing Baseline: not appropriate to test at eval Goal status: INITIAL  2.  Necessary ROM avail for all ADLs Baseline: very limited at eval Goal status: IN PROGRESS 10/18  3.  UEFI to increase to at least 70/80 Baseline: see obj Goal status: progressing  10/18  4.  Able to perform long arc UE motions with weight without incr pain Baseline: will be necessary for painting.  Goal status: INITIAL     PLAN:  PT FREQUENCY: 2x/wk  PT DURATION: 8 weeks  PLANNED INTERVENTIONS: Therapeutic exercises, Therapeutic activity, Neuromuscular re-education, Patient/Family education, Self Care, Joint mobilization, Aquatic Therapy, Dry Needling, Electrical stimulation, Spinal mobilization, Cryotherapy, Moist heat, scar mobilization, Taping, Vasopneumatic device, Ultrasound, Ionotophoresis 4mg /ml Dexamethasone, Manual therapy, and Re-evaluation.  PLAN FOR NEXT SESSION:  Cont with ROM and ther ex per protocol.  Focus on  elbow extension ROM.  PT will send message to MD.     Audie Clear III PT, DPT 06/12/23 4:19 PM

## 2023-06-12 ENCOUNTER — Encounter (HOSPITAL_BASED_OUTPATIENT_CLINIC_OR_DEPARTMENT_OTHER): Payer: Self-pay | Admitting: Physical Therapy

## 2023-06-12 ENCOUNTER — Ambulatory Visit (HOSPITAL_BASED_OUTPATIENT_CLINIC_OR_DEPARTMENT_OTHER): Payer: Worker's Compensation | Attending: Orthopaedic Surgery | Admitting: Physical Therapy

## 2023-06-12 DIAGNOSIS — M25621 Stiffness of right elbow, not elsewhere classified: Secondary | ICD-10-CM | POA: Insufficient documentation

## 2023-06-12 DIAGNOSIS — M25521 Pain in right elbow: Secondary | ICD-10-CM | POA: Insufficient documentation

## 2023-06-12 DIAGNOSIS — M6281 Muscle weakness (generalized): Secondary | ICD-10-CM | POA: Insufficient documentation

## 2023-06-12 DIAGNOSIS — R600 Localized edema: Secondary | ICD-10-CM | POA: Insufficient documentation

## 2023-06-16 ENCOUNTER — Ambulatory Visit (HOSPITAL_BASED_OUTPATIENT_CLINIC_OR_DEPARTMENT_OTHER): Payer: Worker's Compensation | Admitting: Physical Therapy

## 2023-06-16 ENCOUNTER — Encounter (HOSPITAL_BASED_OUTPATIENT_CLINIC_OR_DEPARTMENT_OTHER): Payer: Self-pay | Admitting: Physical Therapy

## 2023-06-16 DIAGNOSIS — M25521 Pain in right elbow: Secondary | ICD-10-CM

## 2023-06-16 DIAGNOSIS — M25621 Stiffness of right elbow, not elsewhere classified: Secondary | ICD-10-CM | POA: Diagnosis not present

## 2023-06-16 DIAGNOSIS — M6281 Muscle weakness (generalized): Secondary | ICD-10-CM

## 2023-06-16 NOTE — Therapy (Signed)
OUTPATIENT PHYSICAL THERAPY TREATMENT       Patient Name: Mark Miranda MRN: [id number] DOB:April 22, 1971, 52 y.o., male Today's Date: 06/16/2023  END OF SESSION:  PT End of Session - 06/16/23 1237     Visit Number 24    Number of Visits 36    Date for PT Re-Evaluation 07/21/23    Authorization Type Workers Comp, Managed MCD    PT Start Time 1234    PT Stop Time 1317    PT Time Calculation (min) 43 min    Activity Tolerance Patient tolerated treatment well    Behavior During Therapy WFL for tasks assessed/performed                               Past Medical History:  Diagnosis Date   BK viremia    Closed fracture of right elbow 03/04/2023   Depression    Diabetic peripheral neuropathy (HCC) 11/24/2015   End stage renal disease (HCC)    Erectile dysfunction    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   Hypercholesterolemia    Hypertension    Metabolic acidosis    Migraines    Recurrent peritonitis (HCC)    Type 1 diabetes with renal manifestations, controlled (HCC)    Vitamin D deficiency    Past Surgical History:  Procedure Laterality Date   AV FISTULA PLACEMENT  01/10/2012   Procedure: ARTERIOVENOUS (AV) FISTULA CREATION;  Surgeon: Sherren Kerns, MD;  Location: [facility] OR;  Service: Vascular;  Laterality: Left;  Left Brachial Cephalic Arteriovenous Fistula   CAPD INSERTION  01/09/2012   Procedure: CONTINUOUS AMBULATORY PERITONEAL DIALYSIS  (CAPD) CATHETER INSERTION;  Surgeon: Ernestene Mention, MD;  Location: [facility] OR;  Service: General;  Laterality: N/A;  Exteriorization PD Cath   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   received left kidney, 3 renal arteries, 1 renal vein, ureter and pancreas from 52 year old   INSERTION OF DIALYSIS CATHETER  01/13/2012   Procedure: INSERTION OF DIALYSIS CATHETER;  Surgeon: Larina Earthly, MD;  Location: [facility] OR;  Service: Vascular;  Laterality: Right;  Internal Jugular    LAPAROSCOPY  03/13/2012   Procedure: LAPAROSCOPY DIAGNOSTIC;  Surgeon: Ardeth Sportsman, MD;  Location: [facility] OR;  Service: General;  Laterality: N/A;   LIVER BIOPSY  04/02/2013   open   ORIF HUMERUS FRACTURE Right 03/07/2023   Procedure: OPEN REDUCTION INTERNAL FIXATION (ORIF) DISTAL HUMERUS FRACTURE;  Surgeon: Huel Cote, MD;  Location: [facility] ORS;  Service: Orthopedics;  Laterality: Right;   PERITONEAL CATHETER INSERTION     PERITONEAL CATHETER REMOVAL     SMALL INTESTINE SURGERY  04/02/2013   TRICEPS TENDON REPAIR  03/07/2023   Procedure: TRICEPS TENDON REPAIR;  Surgeon: Huel Cote, MD;  Location: [facility] ORS;  Service: Orthopedics;;   ULNAR NERVE TRANSPOSITION Right 03/07/2023   Procedure: ULNAR NERVE EXPLORATION;  Surgeon: Huel Cote, MD;  Location: [facility] ORS;  Service: Orthopedics;  Laterality: Right;   Patient Active Problem List   Diagnosis Date Noted   Closed fracture of right distal humerus 03/07/2023   Closed bicondylar fracture of distal end of right humerus 03/04/2023   Type 2 diabetes mellitus with diabetic chronic kidney disease (HCC) 07/22/2022   Depression, major, 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/2022   Erectile dysfunction 07/24/2017   Vitamin D deficiency, unspecified 07/24/2017   Low back pain 02/25/2016   Chronic fatigue 01/25/2016   Diabetic peripheral neuropathy (HCC) 11/24/2015  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   Depression 11/02/2015   Tobacco abuse 11/02/2015   End stage renal disease (HCC) 12/29/2011   HTN (hypertension) 01/25/2011    REFERRING PROVIDER: Huel Cote MD  REFERRING DIAG: [phone] (ICD-10-CM) - Other closed displaced fracture of distal end of right humerus, initial encounter  Post op distal humeral ORIF, ulnar nerve neuroplasty, triceps tendon repair Rationale for Evaluation and Treatment: Rehabilitation  THERAPY DIAG:  Stiffness of right elbow, not elsewhere classified  Pain in right  elbow  Muscle weakness (generalized)  ONSET DATE: DOS 03/07/23   SUBJECTIVE:                                                                                                                                                                                           SUBJECTIVE STATEMENT:  Pt is 14 weeks and 3 days post op.  Pt denies any functional improvements and states "everything is pretty much the same".  Pt continues to have pain.  Pt had a little bit more pain after prior Rx.  He states it feels worse since having the injection.  Pt states it feels more uncomfortable at the elbow.  Pt states he had a little more pain and soreness after prior treatment.   PERTINENT HISTORY:  Vit D deficiency, h/o ESRD & kidney, pancreas transplant  PAIN:  NPRS:  2-3/10 current, 3/10 worst, 2/10 best Location:  post and posterolateral elbow  PRECAUTIONS:  None  RED FLAGS: None   WEIGHT BEARING RESTRICTIONS:  Yes NWB  FALLS:  Has patient fallen in last 6 months? Yes. 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  PATIENT GOALS:  Painting- steady hand, back to normal   OBJECTIVE:   HAND DOMINANCE:  Right                                                                                                                               TODAY'S TREATMENT   R elbow extension AROM/PROM at end of Rx:  25/16  PROM for R elbow flexion/extension with forearm supinated and neutral   Pulleys flexion x41min, abduction Standing active elbow flexion/extension x10 in neutral Standing elbow extension with YTB 3x10 Prone row 2x15 with 1# Prone shoulder horizontal abduction 2x15  ER/IR with arm at side 2x10 / 2x10-15 respectively with YTB Supine rhythmic stab's at 90 deg flexion 3x30 sec distally Standing rows with YTB 2x10 Supine static elbow extension stretch x 2 min with 1# ankle weight around wrist    PATIENT EDUCATION:  Education  details: Anatomy of condition, POC, HEP, exercise form/rationale, rationale of interventions, and ROM findings.     Person educated: Patient Education method: Explanation, Demonstration, Tactile cues, Verbal cues Education comprehension: verbalized understanding, returned demonstration, verbal cues required, tactile cues required, and needs further education  HOME EXERCISE PROGRAM: [id number]   ASSESSMENT:  CLINICAL IMPRESSION:  PT sent a message to MD asking about resistance training and MD sent a message back that he is ok to perform resistance training.  PT progressed therapy with adding light resistance exercises per protocol.  Pt tolerated progression well.  He performed exercises well with cuing and instruction in correct form.  PT performed elbow PROM and pt had pain when coming out of extension initiating flexion when therapist performed extension into a greater range.  He continues to have significant tightness and limitation with elbow extension ROM.  Pt demonstrates a 2 deg improvement in elbow extension AROM and PROM after Rx compared to the end of prior Rx.  Pt hasn't noticed any functional improvements or ROM improvements recently.  Pt reports minimally increased pain from 2-3/10 before Rx to 3-4/10 after Rx.  Pt should benefit from cont skilled PT services per protocol to address ongoing goals and impairments and to assist in restoring desired level of function.      OBJECTIVE IMPAIRMENTS: decreased activity tolerance, decreased ROM, decreased strength, increased edema, impaired flexibility, impaired UE functional use, improper body mechanics, postural dysfunction, and pain.   ACTIVITY LIMITATIONS: carrying, lifting, sleeping, bathing, dressing, and reach over head  PARTICIPATION LIMITATIONS: meal prep, cleaning, laundry, shopping, community activity, occupation, and yard work  PERSONAL FACTORS:  see PMH  are also affecting patient's functional outcome.   REHAB POTENTIAL:  Good  CLINICAL DECISION MAKING: Stable/uncomplicated  EVALUATION COMPLEXITY: Low   GOALS: Goals reviewed with patient? No  SHORT TERM GOALS: Target date: 8/24  Elbow extension to  neutral Baseline: see obj Goal status: ONGOING  2.  Able to make full fist Baseline: unable due to edema at eval Goal status:  MET 9/18  3.  Pt will demo improved R elbow extension AROM/PROM to 15/10 deg for improved stiffness and mobility and performance of self care activities.   Goal Status:  INITIAL  Target date:  06/23/2023     LONG TERM GOALS: Target date: POC date  Strength 80% of opp UE via hand held dynamometry testing Baseline: not appropriate to test at eval Goal status: INITIAL  2.  Necessary ROM avail for all ADLs Baseline: very limited at eval Goal status: IN PROGRESS 10/18  3.  UEFI to increase to at least 70/80 Baseline: see obj Goal status: progressing  10/18  4.  Able to perform long arc UE motions with weight without incr pain Baseline: will be necessary for painting.  Goal status: INITIAL     PLAN:  PT FREQUENCY: 2x/wk  PT DURATION: 8 weeks  PLANNED INTERVENTIONS: Therapeutic exercises, Therapeutic activity, Neuromuscular re-education, Patient/Family education, Self Care, Joint mobilization, Aquatic Therapy,  Dry Needling, Electrical stimulation, Spinal mobilization, Cryotherapy, Moist heat, scar mobilization, Taping, Vasopneumatic device, Ultrasound, Ionotophoresis 4mg /ml Dexamethasone, Manual therapy, and Re-evaluation.  PLAN FOR NEXT SESSION:  Cont with ROM and ther ex per protocol.  Focus on elbow extension ROM.  Assess tolerance to strengthening and cont with strengthening per protocol if pt tolerates well.   Audie Clear III PT, DPT 06/16/23 9:43 PM

## 2023-06-21 ENCOUNTER — Ambulatory Visit (HOSPITAL_BASED_OUTPATIENT_CLINIC_OR_DEPARTMENT_OTHER): Payer: Worker's Compensation | Admitting: Physical Therapy

## 2023-06-21 DIAGNOSIS — M6281 Muscle weakness (generalized): Secondary | ICD-10-CM

## 2023-06-21 DIAGNOSIS — M25621 Stiffness of right elbow, not elsewhere classified: Secondary | ICD-10-CM | POA: Diagnosis not present

## 2023-06-21 DIAGNOSIS — M25521 Pain in right elbow: Secondary | ICD-10-CM

## 2023-06-21 NOTE — Therapy (Signed)
OUTPATIENT PHYSICAL THERAPY TREATMENT       Patient Name: Mark Miranda MRN: [id number] DOB:26-Oct-1970, 52 y.o., male Today's Date: 06/22/2023  END OF SESSION:  PT End of Session - 06/21/23 1155     Visit Number 25    Number of Visits 36    Date for PT Re-Evaluation 07/21/23    Authorization Type Workers Comp, Managed MCD    PT Start Time 1150    PT Stop Time 1234    PT Time Calculation (min) 44 min    Activity Tolerance Patient tolerated treatment well    Behavior During Therapy WFL for tasks assessed/performed                               Past Medical History:  Diagnosis Date   BK viremia    Closed fracture of right elbow 03/04/2023   Depression    Diabetic peripheral neuropathy (HCC) 11/24/2015   End stage renal disease (HCC)    Erectile dysfunction    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   Hypercholesterolemia    Hypertension    Metabolic acidosis    Migraines    Recurrent peritonitis (HCC)    Type 1 diabetes with renal manifestations, controlled (HCC)    Vitamin D deficiency    Past Surgical History:  Procedure Laterality Date   AV FISTULA PLACEMENT  01/10/2012   Procedure: ARTERIOVENOUS (AV) FISTULA CREATION;  Surgeon: Sherren Kerns, MD;  Location: [facility] OR;  Service: Vascular;  Laterality: Left;  Left Brachial Cephalic Arteriovenous Fistula   CAPD INSERTION  01/09/2012   Procedure: CONTINUOUS AMBULATORY PERITONEAL DIALYSIS  (CAPD) CATHETER INSERTION;  Surgeon: Ernestene Mention, MD;  Location: [facility] OR;  Service: General;  Laterality: N/A;  Exteriorization PD Cath   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   received left kidney, 3 renal arteries, 1 renal vein, ureter and pancreas from 52 year old   INSERTION OF DIALYSIS CATHETER  01/13/2012   Procedure: INSERTION OF DIALYSIS CATHETER;  Surgeon: Larina Earthly, MD;  Location: [facility] OR;  Service: Vascular;  Laterality: Right;  Internal Jugular    LAPAROSCOPY  03/13/2012   Procedure: LAPAROSCOPY DIAGNOSTIC;  Surgeon: Ardeth Sportsman, MD;  Location: [facility] OR;  Service: General;  Laterality: N/A;   LIVER BIOPSY  04/02/2013   open   ORIF HUMERUS FRACTURE Right 03/07/2023   Procedure: OPEN REDUCTION INTERNAL FIXATION (ORIF) DISTAL HUMERUS FRACTURE;  Surgeon: Huel Cote, MD;  Location: [facility] ORS;  Service: Orthopedics;  Laterality: Right;   PERITONEAL CATHETER INSERTION     PERITONEAL CATHETER REMOVAL     SMALL INTESTINE SURGERY  04/02/2013   TRICEPS TENDON REPAIR  03/07/2023   Procedure: TRICEPS TENDON REPAIR;  Surgeon: Huel Cote, MD;  Location: [facility] ORS;  Service: Orthopedics;;   ULNAR NERVE TRANSPOSITION Right 03/07/2023   Procedure: ULNAR NERVE EXPLORATION;  Surgeon: Huel Cote, MD;  Location: [facility] ORS;  Service: Orthopedics;  Laterality: Right;   Patient Active Problem List   Diagnosis Date Noted   Closed fracture of right distal humerus 03/07/2023   Closed bicondylar fracture of distal end of right humerus 03/04/2023   Type 2 diabetes mellitus with diabetic chronic kidney disease (HCC) 07/22/2022   Depression, major, 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/2022   Erectile dysfunction 07/24/2017   Vitamin D deficiency, unspecified 07/24/2017   Low back pain 02/25/2016   Chronic fatigue 01/25/2016   Diabetic peripheral neuropathy (HCC) 11/24/2015  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   Depression 11/02/2015   Tobacco abuse 11/02/2015   End stage renal disease (HCC) 12/29/2011   HTN (hypertension) 01/25/2011    REFERRING PROVIDER: Huel Cote MD  REFERRING DIAG: [phone] (ICD-10-CM) - Other closed displaced fracture of distal end of right humerus, initial encounter  Post op distal humeral ORIF, ulnar nerve neuroplasty, triceps tendon repair Rationale for Evaluation and Treatment: Rehabilitation  THERAPY DIAG:  Stiffness of right elbow, not elsewhere classified  Pain in right  elbow  Muscle weakness (generalized)  ONSET DATE: DOS 03/07/23   SUBJECTIVE:                                                                                                                                                                                           SUBJECTIVE STATEMENT:  Pt is 15 weeks and 1 day post op.  Pt states he was a little sore after prior Rx though had no adverse effects after prior Rx.  Pt denies any functional improvement and states everything is about the the same.  Pt states his elbow feels more sore with his stretches at home.     PERTINENT HISTORY:  Vit D deficiency, h/o ESRD & kidney, pancreas transplant  PAIN:  NPRS:  2/10 current, 3/10 worst, 2/10 best Location:  post and posterolateral elbow  PRECAUTIONS:  None  RED FLAGS: None   WEIGHT BEARING RESTRICTIONS:  Yes NWB  FALLS:  Has patient fallen in last 6 months? Yes. 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  PATIENT GOALS:  Painting- steady hand, back to normal   OBJECTIVE:   HAND DOMINANCE:  Right                                                                                                                               TODAY'S TREATMENT  R elbow extension AROM/PROM at beginning of Rx:  32/24 R elbow extension AROM/PROM at end of Rx:  29/22  PROM for R elbow flexion/extension with forearm  supinated and neutral   Pulleys flexion x69min, abduction Standing active elbow flexion/extension x10 in neutral Standing elbow extension with YTB 3x10 Prone row x15 with 1#, 2x10 with 2# Prone shoulder horizontal abduction 3x10  ER/IR with arm at side 2x10 each with YTB Standing rows with YTB x10 and with RTB 2x10 Supine static elbow extension stretch x 2 min with 1# ankle weight around wrist    PATIENT EDUCATION:  Education details: Anatomy of condition, POC, HEP, exercise form/rationale, rationale of interventions, and  ROM findings.     Person educated: Patient Education method: Explanation, Demonstration, Tactile cues, Verbal cues Education comprehension: verbalized understanding, returned demonstration, verbal cues required, tactile cues required, and needs further education  HOME EXERCISE PROGRAM: [id number]   ASSESSMENT:  CLINICAL IMPRESSION:  Pt denies any functional improvement and states everything is about the the same.   Pt has pain when coming out of extension initiating flexion when therapist performed extension into a greater range.  He continues to have significant tightness and limitation with elbow extension ROM.  Overall pt had no improvement with elbow extension ROM though did demonstrate a 3/2 deg improvement in elbow extension AROM/PROM after Rx compared to the beginning of Rx.  Pt performed light strengthening exercises today per protocol and had good tolerance with exercises.  Pt responded well to Rx reporting some soreness in elbow after Rx though no increased pain after Rx.    OBJECTIVE IMPAIRMENTS: decreased activity tolerance, decreased ROM, decreased strength, increased edema, impaired flexibility, impaired UE functional use, improper body mechanics, postural dysfunction, and pain.   ACTIVITY LIMITATIONS: carrying, lifting, sleeping, bathing, dressing, and reach over head  PARTICIPATION LIMITATIONS: meal prep, cleaning, laundry, shopping, community activity, occupation, and yard work  PERSONAL FACTORS:  see PMH  are also affecting patient's functional outcome.   REHAB POTENTIAL: Good  CLINICAL DECISION MAKING: Stable/uncomplicated  EVALUATION COMPLEXITY: Low   GOALS: Goals reviewed with patient? No  SHORT TERM GOALS: Target date: 8/24  Elbow extension to  neutral Baseline: see obj Goal status: ONGOING  2.  Able to make full fist Baseline: unable due to edema at eval Goal status:  MET 9/18  3.  Pt will demo improved R elbow extension AROM/PROM to 15/10 deg for  improved stiffness and mobility and performance of self care activities.   Goal Status:  INITIAL  Target date:  06/23/2023     LONG TERM GOALS: Target date: POC date  Strength 80% of opp UE via hand held dynamometry testing Baseline: not appropriate to test at eval Goal status: INITIAL  2.  Necessary ROM avail for all ADLs Baseline: very limited at eval Goal status: IN PROGRESS 10/18  3.  UEFI to increase to at least 70/80 Baseline: see obj Goal status: progressing  10/18  4.  Able to perform long arc UE motions with weight without incr pain Baseline: will be necessary for painting.  Goal status: INITIAL     PLAN:  PT FREQUENCY: 2x/wk  PT DURATION: 8 weeks  PLANNED INTERVENTIONS: Therapeutic exercises, Therapeutic activity, Neuromuscular re-education, Patient/Family education, Self Care, Joint mobilization, Aquatic Therapy, Dry Needling, Electrical stimulation, Spinal mobilization, Cryotherapy, Moist heat, scar mobilization, Taping, Vasopneumatic device, Ultrasound, Ionotophoresis 4mg /ml Dexamethasone, Manual therapy, and Re-evaluation.  PLAN FOR NEXT SESSION:  Cont with ROM and ther ex per protocol.  Focus on elbow extension ROM.  Assess tolerance to strengthening and cont with strengthening per protocol if pt tolerates well.   Audie Clear III PT, DPT 06/22/23 6:38 PM

## 2023-06-22 ENCOUNTER — Other Ambulatory Visit: Payer: Self-pay | Admitting: Family Medicine

## 2023-06-22 DIAGNOSIS — E1122 Type 2 diabetes mellitus with diabetic chronic kidney disease: Secondary | ICD-10-CM

## 2023-06-26 ENCOUNTER — Encounter (HOSPITAL_BASED_OUTPATIENT_CLINIC_OR_DEPARTMENT_OTHER): Payer: Self-pay

## 2023-06-26 ENCOUNTER — Ambulatory Visit (HOSPITAL_BASED_OUTPATIENT_CLINIC_OR_DEPARTMENT_OTHER): Payer: Worker's Compensation

## 2023-06-26 DIAGNOSIS — M25621 Stiffness of right elbow, not elsewhere classified: Secondary | ICD-10-CM

## 2023-06-26 DIAGNOSIS — R600 Localized edema: Secondary | ICD-10-CM

## 2023-06-26 DIAGNOSIS — M6281 Muscle weakness (generalized): Secondary | ICD-10-CM

## 2023-06-26 DIAGNOSIS — M25521 Pain in right elbow: Secondary | ICD-10-CM

## 2023-06-26 NOTE — Therapy (Signed)
OUTPATIENT PHYSICAL THERAPY TREATMENT       Patient Name: Mark Miranda MRN: [id number] DOB:1971/01/04, 52 y.o., male Today's Date: 06/26/2023  END OF SESSION:  PT End of Session - 06/26/23 1311     Visit Number 26    Number of Visits 36    Date for PT Re-Evaluation 07/21/23    Authorization Type Workers Comp, Managed MCD    PT Start Time 1150    PT Stop Time 1230    PT Time Calculation (min) 40 min    Activity Tolerance Patient tolerated treatment well    Behavior During Therapy WFL for tasks assessed/performed                                Past Medical History:  Diagnosis Date   BK viremia    Closed fracture of right elbow 03/04/2023   Depression    Diabetic peripheral neuropathy (HCC) 11/24/2015   End stage renal disease (HCC)    Erectile dysfunction    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   Hypercholesterolemia    Hypertension    Metabolic acidosis    Migraines    Recurrent peritonitis (HCC)    Type 1 diabetes with renal manifestations, controlled (HCC)    Vitamin D deficiency    Past Surgical History:  Procedure Laterality Date   AV FISTULA PLACEMENT  01/10/2012   Procedure: ARTERIOVENOUS (AV) FISTULA CREATION;  Surgeon: Sherren Kerns, MD;  Location: [facility] OR;  Service: Vascular;  Laterality: Left;  Left Brachial Cephalic Arteriovenous Fistula   CAPD INSERTION  01/09/2012   Procedure: CONTINUOUS AMBULATORY PERITONEAL DIALYSIS  (CAPD) CATHETER INSERTION;  Surgeon: Ernestene Mention, MD;  Location: [facility] OR;  Service: General;  Laterality: N/A;  Exteriorization PD Cath   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   received left kidney, 3 renal arteries, 1 renal vein, ureter and pancreas from 52 year old   INSERTION OF DIALYSIS CATHETER  01/13/2012   Procedure: INSERTION OF DIALYSIS CATHETER;  Surgeon: Larina Earthly, MD;  Location: [facility] OR;  Service: Vascular;  Laterality: Right;  Internal Jugular    LAPAROSCOPY  03/13/2012   Procedure: LAPAROSCOPY DIAGNOSTIC;  Surgeon: Ardeth Sportsman, MD;  Location: [facility] OR;  Service: General;  Laterality: N/A;   LIVER BIOPSY  04/02/2013   open   ORIF HUMERUS FRACTURE Right 03/07/2023   Procedure: OPEN REDUCTION INTERNAL FIXATION (ORIF) DISTAL HUMERUS FRACTURE;  Surgeon: Huel Cote, MD;  Location: [facility] ORS;  Service: Orthopedics;  Laterality: Right;   PERITONEAL CATHETER INSERTION     PERITONEAL CATHETER REMOVAL     SMALL INTESTINE SURGERY  04/02/2013   TRICEPS TENDON REPAIR  03/07/2023   Procedure: TRICEPS TENDON REPAIR;  Surgeon: Huel Cote, MD;  Location: [facility] ORS;  Service: Orthopedics;;   ULNAR NERVE TRANSPOSITION Right 03/07/2023   Procedure: ULNAR NERVE EXPLORATION;  Surgeon: Huel Cote, MD;  Location: [facility] ORS;  Service: Orthopedics;  Laterality: Right;   Patient Active Problem List   Diagnosis Date Noted   Closed fracture of right distal humerus 03/07/2023   Closed bicondylar fracture of distal end of right humerus 03/04/2023   Type 2 diabetes mellitus with diabetic chronic kidney disease (HCC) 07/22/2022   Depression, major, 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/2022   Erectile dysfunction 07/24/2017   Vitamin D deficiency, unspecified 07/24/2017   Low back pain 02/25/2016   Chronic fatigue 01/25/2016   Diabetic peripheral neuropathy (HCC)  11/24/2015   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   Depression 11/02/2015   Tobacco abuse 11/02/2015   End stage renal disease (HCC) 12/29/2011   HTN (hypertension) 01/25/2011    REFERRING PROVIDER: Huel Cote MD  REFERRING DIAG: [phone] (ICD-10-CM) - Other closed displaced fracture of distal end of right humerus, initial encounter  Post op distal humeral ORIF, ulnar nerve neuroplasty, triceps tendon repair Rationale for Evaluation and Treatment: Rehabilitation  THERAPY DIAG:  Localized edema  Muscle weakness (generalized)  Pain in right  elbow  Stiffness of right elbow, not elsewhere classified  ONSET DATE: DOS 03/07/23   SUBJECTIVE:                                                                                                                                                                                           SUBJECTIVE STATEMENT:  Pt reports increase in elbow soreness with PT. "I feel more mobility, but more pain too."    PERTINENT HISTORY:  Vit D deficiency, h/o ESRD & kidney, pancreas transplant  PAIN:  NPRS:  2/10 current, 3/10 worst, 2/10 best Location:  post and posterolateral elbow  PRECAUTIONS:  None  RED FLAGS: None   WEIGHT BEARING RESTRICTIONS:  Yes NWB  FALLS:  Has patient fallen in last 6 months? Yes. 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  PATIENT GOALS:  Painting- steady hand, back to normal   OBJECTIVE:   HAND DOMINANCE:  Right                                                                                                                               TODAY'S TREATMENT  R elbow extension AROM at beginning of Rx:  22 R elbow extension AROM at end of Rx:  19  PROM for R elbow flexion/extension with forearm supinated and neutral   Pulleys flexion x46min, abduction Standing active elbow flexion/extension x10 in neutral Standing elbow extension with YTB 3x10 Elbow flexion 3#2x10 Prone shoulder flexion/abd/ext 2# 2x10ea Supine static elbow  extension stretch x 2 min with 1# ankle weight around wrist    PATIENT EDUCATION:  Education details: Anatomy of condition, POC, HEP, exercise form/rationale, rationale of interventions, and ROM findings.     Person educated: Patient Education method: Explanation, Demonstration, Tactile cues, Verbal cues Education comprehension: verbalized understanding, returned demonstration, verbal cues required, tactile cues required, and needs further education  HOME EXERCISE  PROGRAM: [email or web address]   ASSESSMENT:  CLINICAL IMPRESSION:  Pt tolerates stretching well, though does have pain when returning to flexion. Also experiences crepitus with this which is painful. Pt demonstrates improved elbow extension compared to last session per ROM measurements. He did report some shoulder pain with prone flexion. Also notes difficulty with IR behind back.     OBJECTIVE IMPAIRMENTS: decreased activity tolerance, decreased ROM, decreased strength, increased edema, impaired flexibility, impaired UE functional use, improper body mechanics, postural dysfunction, and pain.   ACTIVITY LIMITATIONS: carrying, lifting, sleeping, bathing, dressing, and reach over head  PARTICIPATION LIMITATIONS: meal prep, cleaning, laundry, shopping, community activity, occupation, and yard work  PERSONAL FACTORS:  see PMH  are also affecting patient's functional outcome.   REHAB POTENTIAL: Good  CLINICAL DECISION MAKING: Stable/uncomplicated  EVALUATION COMPLEXITY: Low   GOALS: Goals reviewed with patient? No  SHORT TERM GOALS: Target date: 8/24  Elbow extension to  neutral Baseline: see obj Goal status: ONGOING  2.  Able to make full fist Baseline: unable due to edema at eval Goal status:  MET 9/18  3.  Pt will demo improved R elbow extension AROM/PROM to 15/10 deg for improved stiffness and mobility and performance of self care activities.   Goal Status:  INITIAL  Target date:  06/23/2023     LONG TERM GOALS: Target date: POC date  Strength 80% of opp UE via hand held dynamometry testing Baseline: not appropriate to test at eval Goal status: INITIAL  2.  Necessary ROM avail for all ADLs Baseline: very limited at eval Goal status: IN PROGRESS 10/18  3.  UEFI to increase to at least 70/80 Baseline: see obj Goal status: progressing  10/18  4.  Able to perform long arc UE motions with weight without incr pain Baseline: will be necessary for painting.  Goal status:  INITIAL     PLAN:  PT FREQUENCY: 2x/wk  PT DURATION: 8 weeks  PLANNED INTERVENTIONS: Therapeutic exercises, Therapeutic activity, Neuromuscular re-education, Patient/Family education, Self Care, Joint mobilization, Aquatic Therapy, Dry Needling, Electrical stimulation, Spinal mobilization, Cryotherapy, Moist heat, scar mobilization, Taping, Vasopneumatic device, Ultrasound, Ionotophoresis 4mg /ml Dexamethasone, Manual therapy, and Re-evaluation.  PLAN FOR NEXT SESSION:  Cont with ROM and ther ex per protocol.  Focus on elbow extension ROM.  Assess tolerance to strengthening and cont with strengthening per protocol if pt tolerates well.   Riki Altes, PTA  06/26/23 1:25 PM

## 2023-06-27 ENCOUNTER — Other Ambulatory Visit (HOSPITAL_BASED_OUTPATIENT_CLINIC_OR_DEPARTMENT_OTHER): Payer: Self-pay

## 2023-06-27 ENCOUNTER — Other Ambulatory Visit (HOSPITAL_BASED_OUTPATIENT_CLINIC_OR_DEPARTMENT_OTHER): Payer: Self-pay | Admitting: Orthopaedic Surgery

## 2023-06-27 ENCOUNTER — Other Ambulatory Visit: Payer: Self-pay

## 2023-06-29 ENCOUNTER — Ambulatory Visit (HOSPITAL_BASED_OUTPATIENT_CLINIC_OR_DEPARTMENT_OTHER): Payer: Worker's Compensation

## 2023-06-29 ENCOUNTER — Encounter (HOSPITAL_BASED_OUTPATIENT_CLINIC_OR_DEPARTMENT_OTHER): Payer: Self-pay

## 2023-06-29 DIAGNOSIS — M25621 Stiffness of right elbow, not elsewhere classified: Secondary | ICD-10-CM | POA: Diagnosis not present

## 2023-06-29 DIAGNOSIS — R600 Localized edema: Secondary | ICD-10-CM

## 2023-06-29 DIAGNOSIS — M25521 Pain in right elbow: Secondary | ICD-10-CM

## 2023-06-29 DIAGNOSIS — M6281 Muscle weakness (generalized): Secondary | ICD-10-CM

## 2023-06-29 NOTE — Therapy (Signed)
OUTPATIENT PHYSICAL THERAPY TREATMENT       Patient Name: Mark Miranda MRN: [id number] DOB:Aug 10, 1970, 52 y.o., male Today's Date: 06/30/2023  END OF SESSION:  PT End of Session - 06/29/23 1538     Visit Number 27    Number of Visits 36    Date for PT Re-Evaluation 07/21/23    Authorization Type Workers Comp, Managed MCD    PT Start Time 1519    PT Stop Time 1604    PT Time Calculation (min) 45 min    Activity Tolerance Patient tolerated treatment well    Behavior During Therapy WFL for tasks assessed/performed                                 Past Medical History:  Diagnosis Date   BK viremia    Closed fracture of right elbow 03/04/2023   Depression    Diabetic peripheral neuropathy (HCC) 11/24/2015   End stage renal disease (HCC)    Erectile dysfunction    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   Hypercholesterolemia    Hypertension    Metabolic acidosis    Migraines    Recurrent peritonitis (HCC)    Type 1 diabetes with renal manifestations, controlled (HCC)    Vitamin D deficiency    Past Surgical History:  Procedure Laterality Date   AV FISTULA PLACEMENT  01/10/2012   Procedure: ARTERIOVENOUS (AV) FISTULA CREATION;  Surgeon: Sherren Kerns, MD;  Location: [facility] OR;  Service: Vascular;  Laterality: Left;  Left Brachial Cephalic Arteriovenous Fistula   CAPD INSERTION  01/09/2012   Procedure: CONTINUOUS AMBULATORY PERITONEAL DIALYSIS  (CAPD) CATHETER INSERTION;  Surgeon: Ernestene Mention, MD;  Location: [facility] OR;  Service: General;  Laterality: N/A;  Exteriorization PD Cath   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   received left kidney, 3 renal arteries, 1 renal vein, ureter and pancreas from 52 year old   INSERTION OF DIALYSIS CATHETER  01/13/2012   Procedure: INSERTION OF DIALYSIS CATHETER;  Surgeon: Larina Earthly, MD;  Location: [facility] OR;  Service: Vascular;  Laterality: Right;  Internal Jugular    LAPAROSCOPY  03/13/2012   Procedure: LAPAROSCOPY DIAGNOSTIC;  Surgeon: Ardeth Sportsman, MD;  Location: [facility] OR;  Service: General;  Laterality: N/A;   LIVER BIOPSY  04/02/2013   open   ORIF HUMERUS FRACTURE Right 03/07/2023   Procedure: OPEN REDUCTION INTERNAL FIXATION (ORIF) DISTAL HUMERUS FRACTURE;  Surgeon: Huel Cote, MD;  Location: [facility] ORS;  Service: Orthopedics;  Laterality: Right;   PERITONEAL CATHETER INSERTION     PERITONEAL CATHETER REMOVAL     SMALL INTESTINE SURGERY  04/02/2013   TRICEPS TENDON REPAIR  03/07/2023   Procedure: TRICEPS TENDON REPAIR;  Surgeon: Huel Cote, MD;  Location: [facility] ORS;  Service: Orthopedics;;   ULNAR NERVE TRANSPOSITION Right 03/07/2023   Procedure: ULNAR NERVE EXPLORATION;  Surgeon: Huel Cote, MD;  Location: [facility] ORS;  Service: Orthopedics;  Laterality: Right;   Patient Active Problem List   Diagnosis Date Noted   Closed fracture of right distal humerus 03/07/2023   Closed bicondylar fracture of distal end of right humerus 03/04/2023   Type 2 diabetes mellitus with diabetic chronic kidney disease (HCC) 07/22/2022   Depression, major, 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/2022   Erectile dysfunction 07/24/2017   Vitamin D deficiency, unspecified 07/24/2017   Low back pain 02/25/2016   Chronic fatigue 01/25/2016   Diabetic peripheral neuropathy (  HCC) 11/24/2015   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   Depression 11/02/2015   Tobacco abuse 11/02/2015   End stage renal disease (HCC) 12/29/2011   HTN (hypertension) 01/25/2011    REFERRING PROVIDER: Huel Cote MD  REFERRING DIAG: [phone] (ICD-10-CM) - Other closed displaced fracture of distal end of right humerus, initial encounter  Post op distal humeral ORIF, ulnar nerve neuroplasty, triceps tendon repair Rationale for Evaluation and Treatment: Rehabilitation  THERAPY DIAG:  Localized edema  Pain in right elbow  Stiffness of right elbow,  not elsewhere classified  Muscle weakness (generalized)  ONSET DATE: DOS 03/07/23   SUBJECTIVE:                                                                                                                                                                                           SUBJECTIVE STATEMENT:  Pt reports no new complaints at entry.     PERTINENT HISTORY:  Vit D deficiency, h/o ESRD & kidney, pancreas transplant  PAIN:  NPRS:  2/10 current, 3/10 worst, 2/10 best Location:  post and posterolateral elbow  PRECAUTIONS:  None  RED FLAGS: None   WEIGHT BEARING RESTRICTIONS:  Yes NWB  FALLS:  Has patient fallen in last 6 months? Yes. 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  PATIENT GOALS:  Painting- steady hand, back to normal   OBJECTIVE:   HAND DOMINANCE:  Right                                                                                                                               TODAY'S TREATMENT   PROM for R elbow flexion/extension   Therabar U, N, and twists x20ea Seated R shoulder flexion with triceps extension at end range 2x10 Supine  Pec stretching at door way Lat stretch at doorway Standing elbow extension with RTB 2x10 Elbow flexion 4#2x10 Bent over shoulder extension 3# 2x10 Weight shifts on wall x10ea Supine static elbow extension stretch x 2 min with 2lb dumbbell  PATIENT EDUCATION:  Education details: Anatomy of condition, POC, HEP, exercise form/rationale, rationale of interventions, and ROM findings.     Person educated: Patient Education method: Explanation, Demonstration, Tactile cues, Verbal cues Education comprehension: verbalized understanding, returned demonstration, verbal cues required, tactile cues required, and needs further education  HOME EXERCISE PROGRAM: [id number]   ASSESSMENT:  CLINICAL IMPRESSION:  Progressed with forearm/arm strengthening  today with good tolerance overall. He remains imited in elbow extension. Pt c/o sholder tightness and discomfort at end ranges with >limitation with functional rotation compared to L UE. Added doorway shoulder stretching to help improve this. Will continue UE ROM and strengthening program as tolerated.    OBJECTIVE IMPAIRMENTS: decreased activity tolerance, decreased ROM, decreased strength, increased edema, impaired flexibility, impaired UE functional use, improper body mechanics, postural dysfunction, and pain.   ACTIVITY LIMITATIONS: carrying, lifting, sleeping, bathing, dressing, and reach over head  PARTICIPATION LIMITATIONS: meal prep, cleaning, laundry, shopping, community activity, occupation, and yard work  PERSONAL FACTORS:  see PMH  are also affecting patient's functional outcome.   REHAB POTENTIAL: Good  CLINICAL DECISION MAKING: Stable/uncomplicated  EVALUATION COMPLEXITY: Low   GOALS: Goals reviewed with patient? No  SHORT TERM GOALS: Target date: 8/24  Elbow extension to  neutral Baseline: see obj Goal status: ONGOING  2.  Able to make full fist Baseline: unable due to edema at eval Goal status:  MET 9/18  3.  Pt will demo improved R elbow extension AROM/PROM to 15/10 deg for improved stiffness and mobility and performance of self care activities.   Goal Status:  INITIAL  Target date:  06/23/2023     LONG TERM GOALS: Target date: POC date  Strength 80% of opp UE via hand held dynamometry testing Baseline: not appropriate to test at eval Goal status: INITIAL  2.  Necessary ROM avail for all ADLs Baseline: very limited at eval Goal status: IN PROGRESS 10/18  3.  UEFI to increase to at least 70/80 Baseline: see obj Goal status: progressing  10/18  4.  Able to perform long arc UE motions with weight without incr pain Baseline: will be necessary for painting.  Goal status: INITIAL     PLAN:  PT FREQUENCY: 2x/wk  PT DURATION: 8 weeks  PLANNED  INTERVENTIONS: Therapeutic exercises, Therapeutic activity, Neuromuscular re-education, Patient/Family education, Self Care, Joint mobilization, Aquatic Therapy, Dry Needling, Electrical stimulation, Spinal mobilization, Cryotherapy, Moist heat, scar mobilization, Taping, Vasopneumatic device, Ultrasound, Ionotophoresis 4mg /ml Dexamethasone, Manual therapy, and Re-evaluation.  PLAN FOR NEXT SESSION:  Cont with ROM and ther ex per protocol.  Focus on elbow extension ROM.  Assess tolerance to strengthening and cont with strengthening per protocol if pt tolerates well.   Riki Altes, PTA  06/30/23 7:54 AM

## 2023-07-04 ENCOUNTER — Other Ambulatory Visit (HOSPITAL_BASED_OUTPATIENT_CLINIC_OR_DEPARTMENT_OTHER): Payer: Self-pay

## 2023-07-04 ENCOUNTER — Other Ambulatory Visit: Payer: Self-pay

## 2023-07-09 NOTE — Therapy (Signed)
OUTPATIENT PHYSICAL THERAPY TREATMENT       Patient Name: Mark Miranda MRN: [id number] DOB:Dec 07, 1970, 52 y.o., male Today's Date: 07/11/2023  END OF SESSION:  PT End of Session - 07/10/23 0952     Visit Number 28    Number of Visits 36    Date for PT Re-Evaluation 07/21/23    Authorization Type Workers Comp, Managed MCD    PT Start Time [phone]    PT Stop Time 1021    PT Time Calculation (min) 43 min    Activity Tolerance Patient tolerated treatment well    Behavior During Therapy [facility] for tasks assessed/performed                                  Past Medical History:  Diagnosis Date   BK viremia    Closed fracture of right elbow 03/04/2023   Depression    Diabetic peripheral neuropathy (HCC) 11/24/2015   End stage renal disease (HCC)    Erectile dysfunction    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   Hypercholesterolemia    Hypertension    Metabolic acidosis    Migraines    Recurrent peritonitis (HCC)    Type 1 diabetes with renal manifestations, controlled (HCC)    Vitamin D deficiency    Past Surgical History:  Procedure Laterality Date   AV FISTULA PLACEMENT  01/10/2012   Procedure: ARTERIOVENOUS (AV) FISTULA CREATION;  Surgeon: Sherren Kerns, MD;  Location: [facility] OR;  Service: Vascular;  Laterality: Left;  Left Brachial Cephalic Arteriovenous Fistula   CAPD INSERTION  01/09/2012   Procedure: CONTINUOUS AMBULATORY PERITONEAL DIALYSIS  (CAPD) CATHETER INSERTION;  Surgeon: Ernestene Mention, MD;  Location: [facility] OR;  Service: General;  Laterality: N/A;  Exteriorization PD Cath   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   received left kidney, 3 renal arteries, 1 renal vein, ureter and pancreas from 52 year old   INSERTION OF DIALYSIS CATHETER  01/13/2012   Procedure: INSERTION OF DIALYSIS CATHETER;  Surgeon: Larina Earthly, MD;  Location: [facility] OR;  Service: Vascular;  Laterality: Right;  Internal Jugular    LAPAROSCOPY  03/13/2012   Procedure: LAPAROSCOPY DIAGNOSTIC;  Surgeon: Ardeth Sportsman, MD;  Location: [facility] OR;  Service: General;  Laterality: N/A;   LIVER BIOPSY  04/02/2013   open   ORIF HUMERUS FRACTURE Right 03/07/2023   Procedure: OPEN REDUCTION INTERNAL FIXATION (ORIF) DISTAL HUMERUS FRACTURE;  Surgeon: Huel Cote, MD;  Location: [facility] ORS;  Service: Orthopedics;  Laterality: Right;   PERITONEAL CATHETER INSERTION     PERITONEAL CATHETER REMOVAL     SMALL INTESTINE SURGERY  04/02/2013   TRICEPS TENDON REPAIR  03/07/2023   Procedure: TRICEPS TENDON REPAIR;  Surgeon: Huel Cote, MD;  Location: [facility] ORS;  Service: Orthopedics;;   ULNAR NERVE TRANSPOSITION Right 03/07/2023   Procedure: ULNAR NERVE EXPLORATION;  Surgeon: Huel Cote, MD;  Location: [facility] ORS;  Service: Orthopedics;  Laterality: Right;   Patient Active Problem List   Diagnosis Date Noted   Closed fracture of right distal humerus 03/07/2023   Closed bicondylar fracture of distal end of right humerus 03/04/2023   Type 2 diabetes mellitus with diabetic chronic kidney disease (HCC) 07/22/2022   Depression, major, 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/2022   Erectile dysfunction 07/24/2017   Vitamin D deficiency, unspecified 07/24/2017   Low back pain 02/25/2016   Chronic fatigue 01/25/2016   Diabetic peripheral  neuropathy (HCC) 11/24/2015   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   Depression 11/02/2015   Tobacco abuse 11/02/2015   End stage renal disease (HCC) 12/29/2011   HTN (hypertension) 01/25/2011    REFERRING PROVIDER: Huel Cote MD  REFERRING DIAG: [phone] (ICD-10-CM) - Other closed displaced fracture of distal end of right humerus, initial encounter  Post op distal humeral ORIF, ulnar nerve neuroplasty, triceps tendon repair  Rationale for Evaluation and Treatment: Rehabilitation  THERAPY DIAG:  Stiffness of right elbow, not elsewhere classified  Pain in right  elbow  Muscle weakness (generalized)  ONSET DATE: DOS 03/07/23   SUBJECTIVE:                                                                                                                                                                                           SUBJECTIVE STATEMENT:  Pt is 17 weeks and 6 days post op.  Pt denies any adverse effects after prior Rx.  Pt doesn't notice any improvement in elbow extension ROM.     PERTINENT HISTORY:  Vit D deficiency, h/o ESRD & kidney, pancreas transplant  PAIN:  NPRS:  2/10 current, 3/10 worst, 2/10 best Location:  post and posterolateral elbow  PRECAUTIONS:  None  RED FLAGS: None   WEIGHT BEARING RESTRICTIONS:  Yes NWB  FALLS:  Has patient fallen in last 6 months? Yes. 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  PATIENT GOALS:  Painting- steady hand, back to normal   OBJECTIVE:   HAND DOMINANCE:  Right                                                                                                                               TODAY'S TREATMENT   PROM for R elbow flexion/extension in supine Supine manual elbow extension stretch 3x20 sec   Therabar U, N, and twists x20ea Prone row 2# x 15, 3# 2x10 Elbow flexion 4# 2x10 Standing elbow extension with RTB 3x10 Weight shifts on wall x10ea Ball rolls on table  cw and ccw 2x10 each (cw and ccw) Supine static elbow extension stretch x 2 min with 2# ankle weight around wrist     PATIENT EDUCATION:  Education details: Anatomy of condition, POC, HEP, exercise form/rationale, and rationale of interventions.     Person educated: Patient Education method: Explanation, Demonstration, Tactile cues, Verbal cues Education comprehension: verbalized understanding, returned demonstration, verbal cues required, tactile cues required, and needs further education  HOME EXERCISE  PROGRAM: [id number]   ASSESSMENT:  CLINICAL IMPRESSION:  Pt continues to have limitations and tightness in elbow extension ROM.  He does have some pain in elbow with end range extension ROM.  Pt is progressing with strengthening exercises per protocol.  Pt performed exercises well and had good tolerance with exercises.  He reports having some elbow pain with weight shifts at wall.  He responded well to Rx reporting some soreness after Rx.  He should benefit from cont skilled PT services to improve ROM, strength, and function and to address ongoing goals.  OBJECTIVE IMPAIRMENTS: decreased activity tolerance, decreased ROM, decreased strength, increased edema, impaired flexibility, impaired UE functional use, improper body mechanics, postural dysfunction, and pain.   ACTIVITY LIMITATIONS: carrying, lifting, sleeping, bathing, dressing, and reach over head  PARTICIPATION LIMITATIONS: meal prep, cleaning, laundry, shopping, community activity, occupation, and yard work  PERSONAL FACTORS:  see PMH  are also affecting patient's functional outcome.   REHAB POTENTIAL: Good  CLINICAL DECISION MAKING: Stable/uncomplicated  EVALUATION COMPLEXITY: Low   GOALS: Goals reviewed with patient? No  SHORT TERM GOALS: Target date: 8/24  Elbow extension to  neutral Baseline: see obj Goal status: ONGOING  2.  Able to make full fist Baseline: unable due to edema at eval Goal status:  MET 9/18  3.  Pt will demo improved R elbow extension AROM/PROM to 15/10 deg for improved stiffness and mobility and performance of self care activities.   Goal Status:  INITIAL  Target date:  06/23/2023     LONG TERM GOALS: Target date: POC date  Strength 80% of opp UE via hand held dynamometry testing Baseline: not appropriate to test at eval Goal status: INITIAL  2.  Necessary ROM avail for all ADLs Baseline: very limited at eval Goal status: IN PROGRESS 10/18  3.  UEFI to increase to at least  70/80 Baseline: see obj Goal status: progressing  10/18  4.  Able to perform long arc UE motions with weight without incr pain Baseline: will be necessary for painting.  Goal status: INITIAL     PLAN:  PT FREQUENCY: 2x/wk  PT DURATION: 8 weeks  PLANNED INTERVENTIONS: Therapeutic exercises, Therapeutic activity, Neuromuscular re-education, Patient/Family education, Self Care, Joint mobilization, Aquatic Therapy, Dry Needling, Electrical stimulation, Spinal mobilization, Cryotherapy, Moist heat, scar mobilization, Taping, Vasopneumatic device, Ultrasound, Ionotophoresis 4mg /ml Dexamethasone, Manual therapy, and Re-evaluation.  PLAN FOR NEXT SESSION:  Cont with ROM and ther ex per protocol.  Focus on elbow extension ROM.  Cont with strengthening per protocol and pt tolerance.   Audie Clear III PT, DPT 07/11/23 8:39 AM

## 2023-07-10 ENCOUNTER — Other Ambulatory Visit (HOSPITAL_BASED_OUTPATIENT_CLINIC_OR_DEPARTMENT_OTHER): Payer: Self-pay

## 2023-07-10 ENCOUNTER — Ambulatory Visit (HOSPITAL_BASED_OUTPATIENT_CLINIC_OR_DEPARTMENT_OTHER): Payer: Worker's Compensation | Attending: Orthopaedic Surgery | Admitting: Physical Therapy

## 2023-07-10 DIAGNOSIS — M25521 Pain in right elbow: Secondary | ICD-10-CM | POA: Diagnosis present

## 2023-07-10 DIAGNOSIS — M25621 Stiffness of right elbow, not elsewhere classified: Secondary | ICD-10-CM

## 2023-07-10 DIAGNOSIS — R600 Localized edema: Secondary | ICD-10-CM | POA: Diagnosis present

## 2023-07-10 DIAGNOSIS — M6281 Muscle weakness (generalized): Secondary | ICD-10-CM | POA: Diagnosis present

## 2023-07-11 ENCOUNTER — Encounter (HOSPITAL_BASED_OUTPATIENT_CLINIC_OR_DEPARTMENT_OTHER): Payer: Self-pay | Admitting: Physical Therapy

## 2023-07-12 ENCOUNTER — Ambulatory Visit (HOSPITAL_BASED_OUTPATIENT_CLINIC_OR_DEPARTMENT_OTHER): Payer: Medicaid Other

## 2023-07-12 ENCOUNTER — Ambulatory Visit (HOSPITAL_BASED_OUTPATIENT_CLINIC_OR_DEPARTMENT_OTHER): Payer: Self-pay | Admitting: Orthopaedic Surgery

## 2023-07-12 ENCOUNTER — Encounter (HOSPITAL_BASED_OUTPATIENT_CLINIC_OR_DEPARTMENT_OTHER): Payer: Self-pay

## 2023-07-12 ENCOUNTER — Ambulatory Visit (HOSPITAL_BASED_OUTPATIENT_CLINIC_OR_DEPARTMENT_OTHER): Payer: Worker's Compensation

## 2023-07-12 ENCOUNTER — Ambulatory Visit (HOSPITAL_BASED_OUTPATIENT_CLINIC_OR_DEPARTMENT_OTHER): Payer: Medicaid Other | Admitting: Orthopaedic Surgery

## 2023-07-12 DIAGNOSIS — R600 Localized edema: Secondary | ICD-10-CM

## 2023-07-12 DIAGNOSIS — S42491A Other displaced fracture of lower end of right humerus, initial encounter for closed fracture: Secondary | ICD-10-CM | POA: Diagnosis not present

## 2023-07-12 DIAGNOSIS — M25521 Pain in right elbow: Secondary | ICD-10-CM

## 2023-07-12 DIAGNOSIS — M25621 Stiffness of right elbow, not elsewhere classified: Secondary | ICD-10-CM | POA: Diagnosis not present

## 2023-07-12 DIAGNOSIS — M6281 Muscle weakness (generalized): Secondary | ICD-10-CM

## 2023-07-12 MED ORDER — IBUPROFEN 800 MG PO TABS: 800.0000 mg | ORAL_TABLET | Freq: Three times a day (TID) | ORAL | 0 refills | Status: AC

## 2023-07-12 MED ORDER — ACETAMINOPHEN 500 MG PO TABS
500.0000 mg | ORAL_TABLET | Freq: Three times a day (TID) | ORAL | 0 refills | Status: AC
Start: 2023-07-12 — End: 2023-07-22

## 2023-07-12 MED ORDER — OXYCODONE HCL 5 MG PO TABS: 5.0000 mg | ORAL_TABLET | ORAL | 0 refills | Status: DC | PRN

## 2023-07-12 NOTE — Progress Notes (Signed)
Post Operative Evaluation    Procedure/Date of Surgery: Status post right distal humerus open reduction internal fixation 7/30  Interval History:   Presents today for follow-up of his right elbow.  He has not had any dramatic change in range of motion status post right elbow injection.  He is here today for further discussion.  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   Hypercholesterolemia    Hypertension    Metabolic acidosis    Migraines    Recurrent peritonitis (HCC)    Type 1 diabetes with renal manifestations, controlled (HCC)    Vitamin D deficiency    Past Surgical History:  Procedure Laterality Date   AV FISTULA PLACEMENT  01/10/2012   Procedure: ARTERIOVENOUS (AV) FISTULA CREATION;  Surgeon: Sherren Kerns, MD;  Location: [facility] OR;  Service: Vascular;  Laterality: Left;  Left Brachial Cephalic Arteriovenous Fistula   CAPD INSERTION  01/09/2012   Procedure: CONTINUOUS AMBULATORY PERITONEAL DIALYSIS  (CAPD) CATHETER INSERTION;  Surgeon: Ernestene Mention, MD;  Location: [facility] OR;  Service: General;  Laterality: N/A;  Exteriorization PD Cath   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   received left kidney, 3 renal arteries, 1 renal vein, ureter and pancreas from 52 year old   INSERTION OF DIALYSIS CATHETER  01/13/2012   Procedure: INSERTION OF DIALYSIS CATHETER;  Surgeon: Larina Earthly, MD;  Location: [facility] OR;  Service: Vascular;  Laterality: Right;  Internal Jugular   LAPAROSCOPY  03/13/2012   Procedure: LAPAROSCOPY DIAGNOSTIC;  Surgeon: Ardeth Sportsman, MD;  Location: [facility] OR;  Service: General;  Laterality: N/A;   LIVER BIOPSY   04/02/2013   open   ORIF HUMERUS FRACTURE Right 03/07/2023   Procedure: OPEN REDUCTION INTERNAL FIXATION (ORIF) DISTAL HUMERUS FRACTURE;  Surgeon: Huel Cote, MD;  Location: [facility] ORS;  Service: Orthopedics;  Laterality: Right;   PERITONEAL CATHETER INSERTION     PERITONEAL CATHETER REMOVAL     SMALL INTESTINE SURGERY  04/02/2013   TRICEPS TENDON REPAIR  03/07/2023   Procedure: TRICEPS TENDON REPAIR;  Surgeon: Huel Cote, MD;  Location: [facility] ORS;  Service: Orthopedics;;   ULNAR NERVE TRANSPOSITION Right 03/07/2023   Procedure: ULNAR NERVE EXPLORATION;  Surgeon: Huel Cote, MD;  Location: [facility] ORS;  Service: Orthopedics;  Laterality: Right;   Social History   Socioeconomic History   Marital status: Married    Spouse name: Byrd Hesselbach   Number of children: 4   Years of education: 10   Highest education level: Not on file  Occupational History   Occupation: Education administrator  Tobacco Use   Smoking status: Every Day    Current packs/day: 1.00    Average packs/day: 1 pack/day for 22.0 years (22.0 ttl pk-yrs)    Types: Cigarettes   Smokeless tobacco: Never  Vaping Use   Vaping status: Every Day   Substances: Nicotine  Substance and Sexual Activity   Alcohol use: Yes    Alcohol/week: 0.0 standard drinks of alcohol    Comment: socially   Drug use: No  Sexual activity: Yes  Other Topics Concern   Not on file  Social History Narrative   Paints houses   Wife works at a Retail banker at [employer]   4 children age 7 to 36   Regular exercise-no current exercise regimen   Caffeine Use-no   Social Determinants of Health   Financial Resource Strain: Not on file  Food Insecurity: Not on file  Transportation Needs: Not on file  Physical Activity: Not on file  Stress: Not on file  Social Connections: Not on file   Family History  Problem Relation Age of Onset   Diabetes Mother    Hypertension Mother    Hyperlipidemia Mother    Cancer Father        glioblastoma   No Known  Allergies Current Outpatient Medications  Medication Sig Dispense Refill   aspirin EC 325 MG tablet Take 1 tablet (325 mg total) by mouth daily. 14 tablet 0   dapagliflozin propanediol (FARXIGA) 10 MG TABS tablet TAKE 1 TABLET BY MOUTH DAILY BEFORE BREAKFAST. 30 tablet 0   HYDROcodone-acetaminophen (NORCO/VICODIN) 5-325 MG tablet Take 1 tablet by mouth every 6 (six) hours as needed for moderate pain. 20 tablet 0   lisinopril (ZESTRIL) 10 MG tablet TAKE 1 TABLET BY MOUTH EVERY DAY 90 tablet 0   meloxicam (MOBIC) 15 MG tablet Take 1 tablet (15 mg total) by mouth daily. 30 tablet 0   mycophenolate (MYFORTIC) 180 MG EC tablet Take 4 tablets by mouth 2 (two) times daily.     oxyCODONE (ROXICODONE) 5 MG immediate release tablet Take 1 tablet (5 mg total) by mouth every 4 (four) hours as needed for severe pain or breakthrough pain. 20 tablet 0   oxyCODONE (ROXICODONE) 5 MG immediate release tablet Take 1 tablet (5 mg total) by mouth every 4 (four) hours as needed for severe pain or breakthrough pain. 20 tablet 0   sildenafil (VIAGRA) 100 MG tablet Take 0.5-1 tablets (50-100 mg total) by mouth daily as needed for erectile dysfunction. 15 tablet 6   sitaGLIPtin (JANUVIA) 50 MG tablet Take 50 mg by mouth daily.     tacrolimus (PROGRAF) 1 MG capsule Take 5 mg by mouth 2 (two) times daily. Pt take 5 mg in the morning and 4 mg at night     traMADol (ULTRAM) 50 MG tablet Take 1 tablet (50 mg total) by mouth every 6 (six) hours as needed. 30 tablet 1   traMADol (ULTRAM) 50 MG tablet Take 1 tablet (50 mg total) by mouth every 6 (six) hours as needed. 30 tablet 0   No current facility-administered medications for this visit.   No results found.  Review of Systems:   A ROS was performed including pertinent positives and negatives as documented in the HPI.   Musculoskeletal Exam:    There were no vitals taken for this visit.  Right incision is well-appearing which is subsequently healed..  Range of motion  is from 15 degrees to 140 degrees.  Sensation is intact to light touch throughout.  Fires ulnar interosseous nerve distribution as well as radial nerve with strong wrist extension and 2+ radial pulse  Imaging:    2 views right humerus: Status post open reduction internal fixation without evidence of hardware complication, there is callus formation  I personally reviewed and interpreted the radiographs.   Assessment:   4 months status post right distal humeral open reduction internal fixation with limited range of motion and loss of extension.  At this point he  did not get any relief from an intra-articular injection of the elbow.  He does have sensitivity and tenderness over his incision as well as the hardware.  Given this we did discuss the possibility of removal of hardware.  He would like to proceed with this.  I did discuss the possibility of a capsular release as well in order to improve his extension.  He would like to proceed with this. Plan :    -Plan for right elbow removal of hardware with open capsular debridement   After a lengthy discussion of treatment options, including risks, benefits, alternatives, complications of surgical and nonsurgical conservative options, the patient elected surgical repair.   The patient  is aware of the material risks  and complications including, but not limited to injury to adjacent structures, neurovascular injury, infection, numbness, bleeding, implant failure, thermal burns, stiffness, persistent pain, failure to heal, disease transmission from allograft, need for further surgery, dislocation, anesthetic risks, blood clots, risks of death,and others. The probabilities of surgical success and failure discussed with patient given their particular co-morbidities.The time and nature of expected rehabilitation and recovery was discussed.The patient's questions were all answered preoperatively.  No barriers to understanding were noted. I explained the  natural history of the disease process and Rx rationale.  I explained to the patient what I considered to be reasonable expectations given their personal situation.  The final treatment plan was arrived at through a shared patient decision making process model.   I personally saw and evaluated the patient, and participated in the management and treatment plan.  Huel Cote, MD Attending Physician, Orthopedic Surgery  This document was dictated using Dragon voice recognition software. A reasonable attempt at proof reading has been made to minimize errors.

## 2023-07-12 NOTE — Therapy (Addendum)
 OUTPATIENT PHYSICAL THERAPY TREATMENT  PHYSICAL THERAPY DISCHARGE SUMMARY  Visits from Start of Care: 29  Current functional level related to goals / functional outcomes: improved   Remaining deficits: Decreased elbow ROM   Education / Equipment: Management of condition; HEP   Patient agrees to discharge. Patient goals were partially met. Patient is being discharged due to not returning since the last visit.  Addend Corrie Dandy Tomma Lightning) Ziemba MPT 09/29/23 10:46 AM [facility] [address] [place], [place], [address] Phone: [phone]   Fax:  [phone]       Patient Name: Mark Miranda MRN: [id number] DOB:1970-08-29, 52 y.o., male Today's Date: 07/12/2023  END OF SESSION:  PT End of Session - 07/12/23 1035     Visit Number 29    Number of Visits 36    Date for PT Re-Evaluation 07/21/23    Authorization Type Workers Comp, Managed MCD    PT Start Time [phone]    PT Stop Time 1014    PT Time Calculation (min) 40 min    Activity Tolerance Patient tolerated treatment well    Behavior During Therapy [facility] for tasks assessed/performed                                   Past Medical History:  Diagnosis Date   BK viremia    Closed fracture of right elbow 03/04/2023   Depression    Diabetic peripheral neuropathy (HCC) 11/24/2015   End stage renal disease (HCC)    Erectile dysfunction    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   Hypercholesterolemia    Hypertension    Metabolic acidosis    Migraines    Recurrent peritonitis (HCC)    Type 1 diabetes with renal manifestations, controlled (HCC)    Vitamin D deficiency    Past Surgical History:  Procedure Laterality Date   AV FISTULA PLACEMENT  01/10/2012   Procedure: ARTERIOVENOUS (AV) FISTULA CREATION;  Surgeon: Sherren Kerns, MD;  Location: [facility] OR;  Service: Vascular;  Laterality: Left;  Left Brachial  Cephalic Arteriovenous Fistula   CAPD INSERTION  01/09/2012   Procedure: CONTINUOUS AMBULATORY PERITONEAL DIALYSIS  (CAPD) CATHETER INSERTION;  Surgeon: Ernestene Mention, MD;  Location: [facility] OR;  Service: General;  Laterality: N/A;  Exteriorization PD Cath   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   received left kidney, 3 renal arteries, 1 renal vein, ureter and pancreas from 52 year old   INSERTION OF DIALYSIS CATHETER  01/13/2012   Procedure: INSERTION OF DIALYSIS CATHETER;  Surgeon: Larina Earthly, MD;  Location: [facility] OR;  Service: Vascular;  Laterality: Right;  Internal Jugular   LAPAROSCOPY  03/13/2012   Procedure: LAPAROSCOPY DIAGNOSTIC;  Surgeon: Ardeth Sportsman, MD;  Location: [facility] OR;  Service: General;  Laterality: N/A;   LIVER BIOPSY  04/02/2013   open   ORIF HUMERUS FRACTURE Right 03/07/2023   Procedure: OPEN REDUCTION INTERNAL FIXATION (ORIF) DISTAL HUMERUS FRACTURE;  Surgeon: Huel Cote, MD;  Location: [facility] ORS;  Service: Orthopedics;  Laterality: Right;   PERITONEAL CATHETER INSERTION     PERITONEAL CATHETER REMOVAL     SMALL INTESTINE SURGERY  04/02/2013   TRICEPS TENDON REPAIR  03/07/2023   Procedure: TRICEPS TENDON REPAIR;  Surgeon: Huel Cote, MD;  Location: [facility] ORS;  Service: Orthopedics;;   ULNAR NERVE TRANSPOSITION Right 03/07/2023   Procedure: ULNAR NERVE EXPLORATION;  Surgeon: Huel Cote,  MD;  Location: [facility] ORS;  Service: Orthopedics;  Laterality: Right;   Patient Active Problem List   Diagnosis Date Noted   Closed fracture of right distal humerus 03/07/2023   Closed bicondylar fracture of distal end of right humerus 03/04/2023   Type 2 diabetes mellitus with diabetic chronic kidney disease (HCC) 07/22/2022   Depression, major, 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/2022   Erectile dysfunction 07/24/2017   Vitamin D deficiency, unspecified 07/24/2017   Low back pain 02/25/2016   Chronic fatigue 01/25/2016   Diabetic peripheral neuropathy (HCC)  11/24/2015   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   Depression 11/02/2015   Tobacco abuse 11/02/2015   End stage renal disease (HCC) 12/29/2011   HTN (hypertension) 01/25/2011    REFERRING PROVIDER: Huel Cote MD  REFERRING DIAG: [phone] (ICD-10-CM) - Other closed displaced fracture of distal end of right humerus, initial encounter  Post op distal humeral ORIF, ulnar nerve neuroplasty, triceps tendon repair  Rationale for Evaluation and Treatment: Rehabilitation  THERAPY DIAG:  Localized edema  Pain in right elbow  Muscle weakness (generalized)  Stiffness of right elbow, not elsewhere classified  ONSET DATE: DOS 03/07/23   SUBJECTIVE:                                                                                                                                                                                           SUBJECTIVE STATEMENT:  Pt is 17 weeks and 6 days post op.  Pt denies any adverse effects after prior Rx.  Pt doesn't notice any improvement in elbow extension ROM.     PERTINENT HISTORY:  Vit D deficiency, h/o ESRD & kidney, pancreas transplant  PAIN:  NPRS:  2/10 current, 3/10 worst, 2/10 best Location:  post and posterolateral elbow  PRECAUTIONS:  None  RED FLAGS: None   WEIGHT BEARING RESTRICTIONS:  Yes NWB  FALLS:  Has patient fallen in last 6 months? Yes. 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  PATIENT GOALS:  Painting- steady hand, back to normal   OBJECTIVE:   HAND DOMINANCE:  Right  TODAY'S TREATMENT   PROM for R elbow flexion/extension in supine 20deg elbow extension, 124deg elbow flexion .   Therabar U, N, and twists x20ea Prone row 2# x 15, 3# 2x10 Elbow flexion 5# 2x10 supine Prone triceps kick back  with 2# 2x10 Seated elow supination 2# attached to end of roller- 2x10 Seated resisted pronation with blue TB x20 Standing elbow extension with RTB 3x10 Weight shifts on plinth x20 Standing shoulder flexion x10 Standing abduction x10 IR behind back 5x 10seconds Abd wall slide 2x10    PATIENT EDUCATION:  Education details: Anatomy of condition, POC, HEP, exercise form/rationale, and rationale of interventions.     Person educated: Patient Education method: Explanation, Demonstration, Tactile cues, Verbal cues Education comprehension: verbalized understanding, returned demonstration, verbal cues required, tactile cues required, and needs further education  HOME EXERCISE PROGRAM: [id number]   ASSESSMENT:  CLINICAL IMPRESSION:  Lacking 20deg elbow extension. Achieved 124deg active elbow flexion. Continues to have crepitus on elbow with flexion/extension. Fatigued with prone triceps kick backs, though no pain reported. Able to tolerate increased resistance for pronation/supination strengthening. Felt challenged by weight shifts on tall plinth. Some limitation in active abduction ROM for R shoulder. Also slightly limited with R IR behind back, so instructed pt in stretches to improve these.  Pt to have MD f/u later today.    OBJECTIVE IMPAIRMENTS: decreased activity tolerance, decreased ROM, decreased strength, increased edema, impaired flexibility, impaired UE functional use, improper body mechanics, postural dysfunction, and pain.   ACTIVITY LIMITATIONS: carrying, lifting, sleeping, bathing, dressing, and reach over head  PARTICIPATION LIMITATIONS: meal prep, cleaning, laundry, shopping, community activity, occupation, and yard work  PERSONAL FACTORS:  see PMH  are also affecting patient's functional outcome.   REHAB POTENTIAL: Good  CLINICAL DECISION MAKING: Stable/uncomplicated  EVALUATION COMPLEXITY: Low   GOALS: Goals reviewed with patient? No  SHORT TERM GOALS: Target  date: 8/24  Elbow extension to  neutral Baseline: see obj Goal status: ONGOING  2.  Able to make full fist Baseline: unable due to edema at eval Goal status:  MET 9/18  3.  Pt will demo improved R elbow extension AROM/PROM to 15/10 deg for improved stiffness and mobility and performance of self care activities.   Goal Status:  INITIAL  Target date:  06/23/2023     LONG TERM GOALS: Target date: POC date  Strength 80% of opp UE via hand held dynamometry testing Baseline: not appropriate to test at eval Goal status: INITIAL  2.  Necessary ROM avail for all ADLs Baseline: very limited at eval Goal status: IN PROGRESS 10/18  3.  UEFI to increase to at least 70/80 Baseline: see obj Goal status: progressing  10/18  4.  Able to perform long arc UE motions with weight without incr pain Baseline: will be necessary for painting.  Goal status: INITIAL     PLAN:  PT FREQUENCY: 2x/wk  PT DURATION: 8 weeks  PLANNED INTERVENTIONS: Therapeutic exercises, Therapeutic activity, Neuromuscular re-education, Patient/Family education, Self Care, Joint mobilization, Aquatic Therapy, Dry Needling, Electrical stimulation, Spinal mobilization, Cryotherapy, Moist heat, scar mobilization, Taping, Vasopneumatic device, Ultrasound, Ionotophoresis 4mg /ml Dexamethasone, Manual therapy, and Re-evaluation.  PLAN FOR NEXT SESSION:  Cont with ROM and ther ex per protocol.  Focus on elbow extension ROM.  Cont with strengthening per protocol and pt tolerance.   Riki Altes, PTA  07/12/23 10:38 AM

## 2023-07-13 ENCOUNTER — Other Ambulatory Visit (HOSPITAL_BASED_OUTPATIENT_CLINIC_OR_DEPARTMENT_OTHER): Payer: Self-pay

## 2023-07-14 ENCOUNTER — Encounter (HOSPITAL_BASED_OUTPATIENT_CLINIC_OR_DEPARTMENT_OTHER): Payer: Medicaid Other | Admitting: Orthopaedic Surgery

## 2023-07-17 ENCOUNTER — Encounter (HOSPITAL_BASED_OUTPATIENT_CLINIC_OR_DEPARTMENT_OTHER): Payer: Medicaid Other

## 2023-07-19 ENCOUNTER — Encounter (HOSPITAL_BASED_OUTPATIENT_CLINIC_OR_DEPARTMENT_OTHER): Payer: Medicaid Other | Admitting: Physical Therapy

## 2023-07-19 ENCOUNTER — Telehealth: Payer: Self-pay | Admitting: Orthopaedic Surgery

## 2023-07-19 NOTE — Telephone Encounter (Signed)
Maudie Mercury called from [employer] asking for a doctor note for the 07/12/2023. [phone] GUY#[id number]

## 2023-07-22 ENCOUNTER — Other Ambulatory Visit: Payer: Self-pay | Admitting: Family Medicine

## 2023-07-24 ENCOUNTER — Encounter (HOSPITAL_BASED_OUTPATIENT_CLINIC_OR_DEPARTMENT_OTHER): Payer: Medicaid Other

## 2023-07-26 ENCOUNTER — Encounter (HOSPITAL_BASED_OUTPATIENT_CLINIC_OR_DEPARTMENT_OTHER): Payer: Medicaid Other | Admitting: Physical Therapy

## 2023-08-12 ENCOUNTER — Other Ambulatory Visit: Payer: Self-pay | Admitting: Family Medicine

## 2023-08-13 ENCOUNTER — Other Ambulatory Visit: Payer: Self-pay | Admitting: Family Medicine

## 2023-08-13 DIAGNOSIS — E1122 Type 2 diabetes mellitus with diabetic chronic kidney disease: Secondary | ICD-10-CM

## 2023-08-23 ENCOUNTER — Ambulatory Visit (HOSPITAL_BASED_OUTPATIENT_CLINIC_OR_DEPARTMENT_OTHER): Payer: Medicaid Other | Admitting: Orthopaedic Surgery

## 2023-08-23 ENCOUNTER — Ambulatory Visit (HOSPITAL_BASED_OUTPATIENT_CLINIC_OR_DEPARTMENT_OTHER): Payer: Worker's Compensation | Admitting: Orthopaedic Surgery

## 2023-08-23 DIAGNOSIS — S42491A Other displaced fracture of lower end of right humerus, initial encounter for closed fracture: Secondary | ICD-10-CM | POA: Diagnosis not present

## 2023-08-23 MED ORDER — VARENICLINE TARTRATE 0.5 MG PO TABS: 0.5000 mg | ORAL_TABLET | Freq: Two times a day (BID) | ORAL | 1 refills | Status: DC

## 2023-08-23 NOTE — Progress Notes (Signed)
 Post Operative Evaluation    Procedure/Date of Surgery: Status post right distal humerus open reduction internal fixation 7/30  Interval History:   Presents today for follow-up of his right elbow.  He has subsequently had a CT scan done for persistent distal humeral pain.  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   Hypercholesterolemia    Hypertension    Metabolic acidosis    Migraines    Recurrent peritonitis (HCC)    Type 1 diabetes with renal manifestations, controlled (HCC)    Vitamin D  deficiency    Past Surgical History:  Procedure Laterality Date   AV FISTULA PLACEMENT  01/10/2012   Procedure: ARTERIOVENOUS (AV) FISTULA CREATION;  Surgeon: Richrd Char, MD;  Location: [facility] OR;  Service: Vascular;  Laterality: Left;  Left Brachial Cephalic Arteriovenous Fistula   CAPD INSERTION  01/09/2012   Procedure: CONTINUOUS AMBULATORY PERITONEAL DIALYSIS  (CAPD) CATHETER INSERTION;  Surgeon: Levert Ready, MD;  Location: [facility] OR;  Service: General;  Laterality: N/A;  Exteriorization PD Cath   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   received left kidney, 3 renal arteries, 1 renal vein, ureter and pancreas from 53 year old   INSERTION OF DIALYSIS CATHETER  01/13/2012   Procedure: INSERTION OF DIALYSIS CATHETER;  Surgeon: Mayo Speck, MD;  Location: [facility] OR;  Service: Vascular;  Laterality: Right;  Internal Jugular   LAPAROSCOPY  03/13/2012   Procedure: LAPAROSCOPY DIAGNOSTIC;  Surgeon: Eddye Goodie, MD;  Location: [facility] OR;  Service: General;  Laterality: N/A;   LIVER BIOPSY   04/02/2013   open   ORIF HUMERUS FRACTURE Right 03/07/2023   Procedure: OPEN REDUCTION INTERNAL FIXATION (ORIF) DISTAL HUMERUS FRACTURE;  Surgeon: Wilhelmenia Harada, MD;  Location: [facility] ORS;  Service: Orthopedics;  Laterality: Right;   PERITONEAL CATHETER INSERTION     PERITONEAL CATHETER REMOVAL     SMALL INTESTINE SURGERY  04/02/2013   TRICEPS TENDON REPAIR  03/07/2023   Procedure: TRICEPS TENDON REPAIR;  Surgeon: Wilhelmenia Harada, MD;  Location: [facility] ORS;  Service: Orthopedics;;   ULNAR NERVE TRANSPOSITION Right 03/07/2023   Procedure: ULNAR NERVE EXPLORATION;  Surgeon: Wilhelmenia Harada, MD;  Location: [facility] ORS;  Service: Orthopedics;  Laterality: Right;   Social History   Socioeconomic History   Marital status: Married    Spouse name: Shelagh Derrick   Number of children: 4   Years of education: 10   Highest education level: Not on file  Occupational History   Occupation: Education administrator  Tobacco Use   Smoking status: Every Day    Current packs/day: 1.00    Average packs/day: 1 pack/day for 22.0 years (22.0 ttl pk-yrs)    Types: Cigarettes   Smokeless tobacco: Never  Vaping Use   Vaping status: Every Day   Substances: Nicotine   Substance and Sexual Activity   Alcohol  use: Yes    Alcohol /week: 0.0 standard drinks of alcohol     Comment: socially   Drug use: No   Sexual activity:  Yes  Other Topics Concern   Not on file  Social History Narrative   Paints houses   Wife works at a Retail banker at [employer]   4 children age 20 to 76   Regular exercise-no current exercise regimen   Caffeine Use-no   Social Drivers of Corporate investment banker Strain: Not on file  Food Insecurity: Not on file  Transportation Needs: Not on file  Physical Activity: Not on file  Stress: Not on file  Social Connections: Not on file   Family History  Problem Relation Age of Onset   Diabetes Mother    Hypertension Mother    Hyperlipidemia Mother    Cancer Father        glioblastoma   No Known Allergies Current  Outpatient Medications  Medication Sig Dispense Refill   varenicline  (CHANTIX ) 0.5 MG tablet Take 1 tablet (0.5 mg total) by mouth 2 (two) times daily. 60 tablet 1   aspirin  EC 325 MG tablet Take 1 tablet (325 mg total) by mouth daily. 14 tablet 0   dapagliflozin  propanediol (FARXIGA ) 10 MG TABS tablet TAKE 1 TABLET BY MOUTH DAILY BEFORE BREAKFAST. 30 tablet 0   HYDROcodone -acetaminophen  (NORCO/VICODIN) 5-325 MG tablet Take 1 tablet by mouth every 6 (six) hours as needed for moderate pain. 20 tablet 0   lisinopril  (ZESTRIL ) 10 MG tablet TAKE 1 TABLET BY MOUTH EVERY DAY 30 tablet 0   meloxicam  (MOBIC ) 15 MG tablet Take 1 tablet (15 mg total) by mouth daily. 30 tablet 0   mycophenolate (MYFORTIC) 180 MG EC tablet Take 4 tablets by mouth 2 (two) times daily.     oxyCODONE  (ROXICODONE ) 5 MG immediate release tablet Take 1 tablet (5 mg total) by mouth every 4 (four) hours as needed for severe pain or breakthrough pain. 20 tablet 0   oxyCODONE  (ROXICODONE ) 5 MG immediate release tablet Take 1 tablet (5 mg total) by mouth every 4 (four) hours as needed for severe pain or breakthrough pain. 20 tablet 0   oxyCODONE  (ROXICODONE ) 5 MG immediate release tablet Take 1 tablet (5 mg total) by mouth every 4 (four) hours as needed for severe pain (pain score 7-10) or breakthrough pain. 30 tablet 0   sildenafil  (VIAGRA ) 100 MG tablet Take 0.5-1 tablets (50-100 mg total) by mouth daily as needed for erectile dysfunction. 15 tablet 6   sitaGLIPtin (JANUVIA) 50 MG tablet Take 50 mg by mouth daily.     tacrolimus  (PROGRAF ) 1 MG capsule Take 5 mg by mouth 2 (two) times daily. Pt take 5 mg in the morning and 4 mg at night     traMADol  (ULTRAM ) 50 MG tablet Take 1 tablet (50 mg total) by mouth every 6 (six) hours as needed. 30 tablet 1   traMADol  (ULTRAM ) 50 MG tablet Take 1 tablet (50 mg total) by mouth every 6 (six) hours as needed. 30 tablet 0   No current facility-administered medications for this visit.   No results  found.  Review of Systems:   A ROS was performed including pertinent positives and negatives as documented in the HPI.   Musculoskeletal Exam:    There were no vitals taken for this visit.  Right incision is well-appearing which is subsequently healed..  Range of motion is from 15 degrees to 140 degrees.  Sensation is intact to light touch throughout.  Fires ulnar interosseous nerve distribution as well as radial nerve with strong wrist extension and 2+ radial pulse    Imaging:  CT scan right elbow: Status post open reduction internal fixation with evidence of nonunion of the fracture site  I personally reviewed and interpreted the radiographs.   Assessment:   6 months status post right distal humeral open reduction internal fixation with limited range of motion and loss of extension.  Unfortunately I did describe that his CT scan thin cuts today do show evidence of distal humeral nonunion.  I do believe that this is related to his smoking approximately a pack of cigarettes per day.  At this time I do believe he may ultimately require revision hardware and bone grafting although I would be reluctant to do this while he is smoking.  I did counsel him on smoking cessation.  We will plan on a course of Chantix  to help him with this.  I will plan to see him back in 3 weeks to assess his progress.  I did discuss that he would be a candidate for this surgery pending the ability to stop smoking Plan :    -Return to clinic 3 weeks  Wilhelmenia Harada, MD Attending Physician, Orthopedic Surgery  This document was dictated using Dragon voice recognition software. A reasonable attempt at proof reading has been made to minimize errors.

## 2023-09-01 ENCOUNTER — Ambulatory Visit (HOSPITAL_BASED_OUTPATIENT_CLINIC_OR_DEPARTMENT_OTHER): Payer: Medicaid Other | Admitting: Orthopaedic Surgery

## 2023-09-13 ENCOUNTER — Ambulatory Visit (HOSPITAL_BASED_OUTPATIENT_CLINIC_OR_DEPARTMENT_OTHER): Payer: Worker's Compensation | Admitting: Orthopaedic Surgery

## 2023-09-13 DIAGNOSIS — S42491A Other displaced fracture of lower end of right humerus, initial encounter for closed fracture: Secondary | ICD-10-CM

## 2023-09-13 MED ORDER — VARENICLINE TARTRATE 0.5 MG PO TABS: 0.5000 mg | ORAL_TABLET | Freq: Two times a day (BID) | ORAL | 0 refills | Status: DC

## 2023-09-13 NOTE — Progress Notes (Signed)
 Post Operative Evaluation    Procedure/Date of Surgery: Status post right distal humerus open reduction internal fixation 7/30  Interval History:   Presents today for follow-up of his right elbow.  He has been able to cut down smoking to approximately half a pack per day.  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   Hypercholesterolemia    Hypertension    Metabolic acidosis    Migraines    Recurrent peritonitis (HCC)    Type 1 diabetes with renal manifestations, controlled (HCC)    Vitamin D  deficiency    Past Surgical History:  Procedure Laterality Date   AV FISTULA PLACEMENT  01/10/2012   Procedure: ARTERIOVENOUS (AV) FISTULA CREATION;  Surgeon: Carlin FORBES Haddock, MD;  Location: [facility] OR;  Service: Vascular;  Laterality: Left;  Left Brachial Cephalic Arteriovenous Fistula   CAPD INSERTION  01/09/2012   Procedure: CONTINUOUS AMBULATORY PERITONEAL DIALYSIS  (CAPD) CATHETER INSERTION;  Surgeon: Elon CHRISTELLA Pacini, MD;  Location: [facility] OR;  Service: General;  Laterality: N/A;  Exteriorization PD Cath   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   received left kidney, 3 renal arteries, 1 renal vein, ureter and pancreas from 53 year old   INSERTION OF DIALYSIS CATHETER  01/13/2012   Procedure: INSERTION OF DIALYSIS CATHETER;  Surgeon: Krystal JULIANNA Doing, MD;  Location: [facility] OR;  Service: Vascular;  Laterality: Right;  Internal Jugular   LAPAROSCOPY  03/13/2012   Procedure: LAPAROSCOPY DIAGNOSTIC;  Surgeon: Elspeth KYM Schultze, MD;  Location: [facility] OR;  Service: General;  Laterality: N/A;   LIVER BIOPSY  04/02/2013   open   ORIF HUMERUS FRACTURE Right  03/07/2023   Procedure: OPEN REDUCTION INTERNAL FIXATION (ORIF) DISTAL HUMERUS FRACTURE;  Surgeon: Genelle Elspeth, MD;  Location: [facility] ORS;  Service: Orthopedics;  Laterality: Right;   PERITONEAL CATHETER INSERTION     PERITONEAL CATHETER REMOVAL     SMALL INTESTINE SURGERY  04/02/2013   TRICEPS TENDON REPAIR  03/07/2023   Procedure: TRICEPS TENDON REPAIR;  Surgeon: Genelle Elspeth, MD;  Location: [facility] ORS;  Service: Orthopedics;;   ULNAR NERVE TRANSPOSITION Right 03/07/2023   Procedure: ULNAR NERVE EXPLORATION;  Surgeon: Genelle Elspeth, MD;  Location: [facility] ORS;  Service: Orthopedics;  Laterality: Right;   Social History   Socioeconomic History   Marital status: Married    Spouse name: [name]   Number of children: 4   Years of education: 10   Highest education level: Not on file  Occupational History   Occupation: Education Administrator  Tobacco Use   Smoking status: Every Day    Current packs/day: 1.00    Average packs/day: 1 pack/day for 22.0 years (22.0 ttl pk-yrs)    Types: Cigarettes   Smokeless tobacco: Never  Vaping Use   Vaping status: Every Day   Substances: Nicotine   Substance and Sexual Activity   Alcohol  use: Yes    Alcohol /week: 0.0 standard drinks of alcohol     Comment: socially   Drug use: No   Sexual activity: Yes  Other Topics Concern  Not on file  Social History Narrative   Paints houses   Wife works at a retail banker at [employer]   4 children age 78 to 49   Regular exercise-no current exercise regimen   Caffeine Use-no   Social Drivers of Corporate Investment Banker Strain: Not on file  Food Insecurity: Not on file  Transportation Needs: Not on file  Physical Activity: Not on file  Stress: Not on file  Social Connections: Not on file   Family History  Problem Relation Age of Onset   Diabetes Mother    Hypertension Mother    Hyperlipidemia Mother    Cancer Father        glioblastoma   No Known Allergies Current Outpatient Medications  Medication Sig Dispense  Refill   aspirin  EC 325 MG tablet Take 1 tablet (325 mg total) by mouth daily. 14 tablet 0   dapagliflozin  propanediol (FARXIGA ) 10 MG TABS tablet TAKE 1 TABLET BY MOUTH DAILY BEFORE BREAKFAST. 30 tablet 0   HYDROcodone -acetaminophen  (NORCO/VICODIN) 5-325 MG tablet Take 1 tablet by mouth every 6 (six) hours as needed for moderate pain. 20 tablet 0   lisinopril  (ZESTRIL ) 10 MG tablet TAKE 1 TABLET BY MOUTH EVERY DAY 30 tablet 0   meloxicam  (MOBIC ) 15 MG tablet Take 1 tablet (15 mg total) by mouth daily. 30 tablet 0   mycophenolate (MYFORTIC) 180 MG EC tablet Take 4 tablets by mouth 2 (two) times daily.     oxyCODONE  (ROXICODONE ) 5 MG immediate release tablet Take 1 tablet (5 mg total) by mouth every 4 (four) hours as needed for severe pain or breakthrough pain. 20 tablet 0   oxyCODONE  (ROXICODONE ) 5 MG immediate release tablet Take 1 tablet (5 mg total) by mouth every 4 (four) hours as needed for severe pain or breakthrough pain. 20 tablet 0   oxyCODONE  (ROXICODONE ) 5 MG immediate release tablet Take 1 tablet (5 mg total) by mouth every 4 (four) hours as needed for severe pain (pain score 7-10) or breakthrough pain. 30 tablet 0   sildenafil  (VIAGRA ) 100 MG tablet Take 0.5-1 tablets (50-100 mg total) by mouth daily as needed for erectile dysfunction. 15 tablet 6   sitaGLIPtin (JANUVIA) 50 MG tablet Take 50 mg by mouth daily.     tacrolimus  (PROGRAF ) 1 MG capsule Take 5 mg by mouth 2 (two) times daily. Pt take 5 mg in the morning and 4 mg at night     traMADol  (ULTRAM ) 50 MG tablet Take 1 tablet (50 mg total) by mouth every 6 (six) hours as needed. 30 tablet 1   traMADol  (ULTRAM ) 50 MG tablet Take 1 tablet (50 mg total) by mouth every 6 (six) hours as needed. 30 tablet 0   varenicline  (CHANTIX ) 0.5 MG tablet Take 1 tablet (0.5 mg total) by mouth 2 (two) times daily. 60 tablet 1   No current facility-administered medications for this visit.   No results found.  Review of Systems:   A ROS was  performed including pertinent positives and negatives as documented in the HPI.   Musculoskeletal Exam:    There were no vitals taken for this visit.  Right incision is well-appearing which is subsequently healed..  Range of motion is from 15 degrees to 140 degrees.  Sensation is intact to light touch throughout.  Fires ulnar interosseous nerve distribution as well as radial nerve with strong wrist extension and 2+ radial pulse    Imaging:    CT scan right elbow: Status post open  reduction internal fixation with evidence of nonunion of the fracture site  I personally reviewed and interpreted the radiographs.   Assessment:   6 months status post right distal humeral open reduction internal fixation with limited range of motion and loss of extension.  Unfortunately I did describe that his CT scan thin cuts today do show evidence of distal humeral nonunion.  I do believe that this is related to his smoking approximately a pack of cigarettes per day.  At this time I do believe he may ultimately require revision hardware and bone grafting although I would be reluctant to do this while he is smoking.  I did counsel him on smoking cessation.  At this visit he is down to half a pack per day.  I would like him to continue to quit smoking completely prior to any type of surgical intervention.  He will continue to work on this and I will see him back in 4 weeks for reassessment Plan :    -Return to clinic 4 weeks  Elspeth Parker, MD Attending Physician, Orthopedic Surgery  This document was dictated using Dragon voice recognition software. A reasonable attempt at proof reading has been made to minimize errors.

## 2023-09-14 ENCOUNTER — Other Ambulatory Visit (HOSPITAL_BASED_OUTPATIENT_CLINIC_OR_DEPARTMENT_OTHER): Payer: Self-pay | Admitting: Orthopaedic Surgery

## 2023-09-21 ENCOUNTER — Other Ambulatory Visit: Payer: Self-pay | Admitting: Family Medicine

## 2023-09-21 DIAGNOSIS — E1122 Type 2 diabetes mellitus with diabetic chronic kidney disease: Secondary | ICD-10-CM

## 2023-10-11 ENCOUNTER — Ambulatory Visit (HOSPITAL_BASED_OUTPATIENT_CLINIC_OR_DEPARTMENT_OTHER): Payer: Worker's Compensation | Admitting: Orthopaedic Surgery

## 2023-10-11 ENCOUNTER — Ambulatory Visit (INDEPENDENT_AMBULATORY_CARE_PROVIDER_SITE_OTHER): Payer: Worker's Compensation

## 2023-10-11 ENCOUNTER — Other Ambulatory Visit (HOSPITAL_BASED_OUTPATIENT_CLINIC_OR_DEPARTMENT_OTHER): Payer: Self-pay | Admitting: Orthopaedic Surgery

## 2023-10-11 ENCOUNTER — Ambulatory Visit (HOSPITAL_BASED_OUTPATIENT_CLINIC_OR_DEPARTMENT_OTHER): Payer: Self-pay | Admitting: Orthopaedic Surgery

## 2023-10-11 DIAGNOSIS — S42491A Other displaced fracture of lower end of right humerus, initial encounter for closed fracture: Secondary | ICD-10-CM

## 2023-10-11 NOTE — Progress Notes (Signed)
 Post Operative Evaluation    Procedure/Date of Surgery: Status post right distal humerus open reduction internal fixation 7/30  Interval History:   Presents today for follow-up of his right elbow.  He was able to discontinue smoking on February 23.  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   Hypercholesterolemia    Hypertension    Metabolic acidosis    Migraines    Recurrent peritonitis (HCC)    Type 1 diabetes with renal manifestations, controlled (HCC)    Vitamin D deficiency    Past Surgical History:  Procedure Laterality Date   AV FISTULA PLACEMENT  01/10/2012   Procedure: ARTERIOVENOUS (AV) FISTULA CREATION;  Surgeon: Sherren Kerns, MD;  Location: [facility] OR;  Service: Vascular;  Laterality: Left;  Left Brachial Cephalic Arteriovenous Fistula   CAPD INSERTION  01/09/2012   Procedure: CONTINUOUS AMBULATORY PERITONEAL DIALYSIS  (CAPD) CATHETER INSERTION;  Surgeon: Ernestene Mention, MD;  Location: [facility] OR;  Service: General;  Laterality: N/A;  Exteriorization PD Cath   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   received left kidney, 3 renal arteries, 1 renal vein, ureter and pancreas from 53 year old   INSERTION OF DIALYSIS CATHETER  01/13/2012   Procedure: INSERTION OF DIALYSIS CATHETER;  Surgeon: Larina Earthly, MD;  Location: [facility] OR;  Service: Vascular;  Laterality: Right;  Internal Jugular   LAPAROSCOPY  03/13/2012   Procedure: LAPAROSCOPY DIAGNOSTIC;  Surgeon: Ardeth Sportsman, MD;  Location: [facility] OR;  Service: General;  Laterality: N/A;   LIVER BIOPSY  04/02/2013   open   ORIF HUMERUS FRACTURE Right 03/07/2023   Procedure: OPEN  REDUCTION INTERNAL FIXATION (ORIF) DISTAL HUMERUS FRACTURE;  Surgeon: Huel Cote, MD;  Location: [facility] ORS;  Service: Orthopedics;  Laterality: Right;   PERITONEAL CATHETER INSERTION     PERITONEAL CATHETER REMOVAL     SMALL INTESTINE SURGERY  04/02/2013   TRICEPS TENDON REPAIR  03/07/2023   Procedure: TRICEPS TENDON REPAIR;  Surgeon: Huel Cote, MD;  Location: [facility] ORS;  Service: Orthopedics;;   ULNAR NERVE TRANSPOSITION Right 03/07/2023   Procedure: ULNAR NERVE EXPLORATION;  Surgeon: Huel Cote, MD;  Location: [facility] ORS;  Service: Orthopedics;  Laterality: Right;   Social History   Socioeconomic History   Marital status: Married    Spouse name: Byrd Hesselbach   Number of children: 4   Years of education: 10   Highest education level: Not on file  Occupational History   Occupation: Education administrator  Tobacco Use   Smoking status: Every Day    Current packs/day: 1.00    Average packs/day: 1 pack/day for 22.0 years (22.0 ttl pk-yrs)    Types: Cigarettes   Smokeless tobacco: Never  Vaping Use   Vaping status: Every Day   Substances: Nicotine  Substance and Sexual Activity   Alcohol use: Yes    Alcohol/week: 0.0 standard drinks of alcohol    Comment: socially   Drug use: No   Sexual activity: Yes  Other Topics Concern   Not on file  Social  History Narrative   Paints houses   Wife works at a Retail banker at [employer]   4 children age 56 to 38   Regular exercise-no current exercise regimen   Caffeine Use-no   Social Drivers of Corporate investment banker Strain: Not on file  Food Insecurity: Not on file  Transportation Needs: Not on file  Physical Activity: Not on file  Stress: Not on file  Social Connections: Not on file   Family History  Problem Relation Age of Onset   Diabetes Mother    Hypertension Mother    Hyperlipidemia Mother    Cancer Father        glioblastoma   No Known Allergies Current Outpatient Medications  Medication Sig Dispense Refill   aspirin EC 325 MG  tablet Take 1 tablet (325 mg total) by mouth daily. 14 tablet 0   dapagliflozin propanediol (FARXIGA) 10 MG TABS tablet TAKE 1 TABLET BY MOUTH DAILY BEFORE BREAKFAST. 30 tablet 0   HYDROcodone-acetaminophen (NORCO/VICODIN) 5-325 MG tablet Take 1 tablet by mouth every 6 (six) hours as needed for moderate pain. 20 tablet 0   lisinopril (ZESTRIL) 10 MG tablet TAKE 1 TABLET BY MOUTH EVERY DAY 30 tablet 0   meloxicam (MOBIC) 15 MG tablet Take 1 tablet (15 mg total) by mouth daily. 30 tablet 0   mycophenolate (MYFORTIC) 180 MG EC tablet Take 4 tablets by mouth 2 (two) times daily.     oxyCODONE (ROXICODONE) 5 MG immediate release tablet Take 1 tablet (5 mg total) by mouth every 4 (four) hours as needed for severe pain or breakthrough pain. 20 tablet 0   oxyCODONE (ROXICODONE) 5 MG immediate release tablet Take 1 tablet (5 mg total) by mouth every 4 (four) hours as needed for severe pain or breakthrough pain. 20 tablet 0   oxyCODONE (ROXICODONE) 5 MG immediate release tablet Take 1 tablet (5 mg total) by mouth every 4 (four) hours as needed for severe pain (pain score 7-10) or breakthrough pain. 30 tablet 0   sildenafil (VIAGRA) 100 MG tablet Take 0.5-1 tablets (50-100 mg total) by mouth daily as needed for erectile dysfunction. 15 tablet 6   sitaGLIPtin (JANUVIA) 50 MG tablet Take 50 mg by mouth daily.     tacrolimus (PROGRAF) 1 MG capsule Take 5 mg by mouth 2 (two) times daily. Pt take 5 mg in the morning and 4 mg at night     traMADol (ULTRAM) 50 MG tablet Take 1 tablet (50 mg total) by mouth every 6 (six) hours as needed. 30 tablet 1   traMADol (ULTRAM) 50 MG tablet Take 1 tablet (50 mg total) by mouth every 6 (six) hours as needed. 30 tablet 0   varenicline (CHANTIX) 0.5 MG tablet Take 1 tablet (0.5 mg total) by mouth 2 (two) times daily. 60 tablet 0   varenicline (CHANTIX) 0.5 MG tablet TAKE 1 TABLET BY MOUTH 2 TIMES DAILY. 180 tablet 1   No current facility-administered medications for this visit.    No results found.  Review of Systems:   A ROS was performed including pertinent positives and negatives as documented in the HPI.   Musculoskeletal Exam:    There were no vitals taken for this visit.  Right incision is well-appearing which is subsequently healed..  Range of motion is from 15 degrees to 140 degrees.  Sensation is intact to light touch throughout.  Fires ulnar interosseous nerve distribution as well as radial nerve with strong wrist extension and 2+ radial pulse  Imaging:    X-rays right elbow 3 views: There is loosening of one of the medial screws with improved callus across the fracture line  I personally reviewed and interpreted the radiographs.   Assessment:   Status post right distal humeral open reduction internal fixation with limited range of motion and loss of extension.  At this time he does have some evidence of increased callus formation on x-ray today.  This is consistent with healing.  I did describe that at this point he has having some loosening of one of the screws of the elbow.  I did discuss that I would like to remove his hardware as this appears to be significantly symptomatic.  I did discuss that if there is concern for persistent fracture delayed union that we may need to utilize an additional plate for temporary stabilization although this would be in a much simpler fracture plane than his original injury.  That being said I do feel confident that there is enough healing on his x-ray today that we can proceed with removal of hardware.  I am confident that there is no evidence of infection given his negative inflammatory labs Plan :    -Plan for right elbow distal humerus removal of hardware with ulnar nerve neurolysis   After a lengthy discussion of treatment options, including risks, benefits, alternatives, complications of surgical and nonsurgical conservative options, the patient elected surgical repair.   The patient  is aware of the  material risks  and complications including, but not limited to injury to adjacent structures, neurovascular injury, infection, numbness, bleeding, implant failure, thermal burns, stiffness, persistent pain, failure to heal, disease transmission from allograft, need for further surgery, dislocation, anesthetic risks, blood clots, risks of death,and others. The probabilities of surgical success and failure discussed with patient given their particular co-morbidities.The time and nature of expected rehabilitation and recovery was discussed.The patient's questions were all answered preoperatively.  No barriers to understanding were noted. I explained the natural history of the disease process and Rx rationale.  I explained to the patient what I considered to be reasonable expectations given their personal situation.  The final treatment plan was arrived at through a shared patient decision making process model.   Huel Cote, MD Attending Physician, Orthopedic Surgery  This document was dictated using Dragon voice recognition software. A reasonable attempt at proof reading has been made to minimize errors.

## 2023-10-11 NOTE — H&P (View-Only) (Signed)
 Post Operative Evaluation    Procedure/Date of Surgery: Status post right distal humerus open reduction internal fixation 7/30  Interval History:   Presents today for follow-up of his right elbow.  He was able to discontinue smoking on February 23.  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   Hypercholesterolemia    Hypertension    Metabolic acidosis    Migraines    Recurrent peritonitis (HCC)    Type 1 diabetes with renal manifestations, controlled (HCC)    Vitamin D deficiency    Past Surgical History:  Procedure Laterality Date   AV FISTULA PLACEMENT  01/10/2012   Procedure: ARTERIOVENOUS (AV) FISTULA CREATION;  Surgeon: Sherren Kerns, MD;  Location: [facility] OR;  Service: Vascular;  Laterality: Left;  Left Brachial Cephalic Arteriovenous Fistula   CAPD INSERTION  01/09/2012   Procedure: CONTINUOUS AMBULATORY PERITONEAL DIALYSIS  (CAPD) CATHETER INSERTION;  Surgeon: Ernestene Mention, MD;  Location: [facility] OR;  Service: General;  Laterality: N/A;  Exteriorization PD Cath   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   received left kidney, 3 renal arteries, 1 renal vein, ureter and pancreas from 53 year old   INSERTION OF DIALYSIS CATHETER  01/13/2012   Procedure: INSERTION OF DIALYSIS CATHETER;  Surgeon: Larina Earthly, MD;  Location: [facility] OR;  Service: Vascular;  Laterality: Right;  Internal Jugular   LAPAROSCOPY  03/13/2012   Procedure: LAPAROSCOPY DIAGNOSTIC;  Surgeon: Ardeth Sportsman, MD;  Location: [facility] OR;  Service: General;  Laterality: N/A;   LIVER BIOPSY  04/02/2013   open   ORIF HUMERUS FRACTURE Right 03/07/2023   Procedure: OPEN  REDUCTION INTERNAL FIXATION (ORIF) DISTAL HUMERUS FRACTURE;  Surgeon: Huel Cote, MD;  Location: [facility] ORS;  Service: Orthopedics;  Laterality: Right;   PERITONEAL CATHETER INSERTION     PERITONEAL CATHETER REMOVAL     SMALL INTESTINE SURGERY  04/02/2013   TRICEPS TENDON REPAIR  03/07/2023   Procedure: TRICEPS TENDON REPAIR;  Surgeon: Huel Cote, MD;  Location: [facility] ORS;  Service: Orthopedics;;   ULNAR NERVE TRANSPOSITION Right 03/07/2023   Procedure: ULNAR NERVE EXPLORATION;  Surgeon: Huel Cote, MD;  Location: [facility] ORS;  Service: Orthopedics;  Laterality: Right;   Social History   Socioeconomic History   Marital status: Married    Spouse name: Byrd Hesselbach   Number of children: 4   Years of education: 10   Highest education level: Not on file  Occupational History   Occupation: Education administrator  Tobacco Use   Smoking status: Every Day    Current packs/day: 1.00    Average packs/day: 1 pack/day for 22.0 years (22.0 ttl pk-yrs)    Types: Cigarettes   Smokeless tobacco: Never  Vaping Use   Vaping status: Every Day   Substances: Nicotine  Substance and Sexual Activity   Alcohol use: Yes    Alcohol/week: 0.0 standard drinks of alcohol    Comment: socially   Drug use: No   Sexual activity: Yes  Other Topics Concern   Not on file  Social  History Narrative   Paints houses   Wife works at a Retail banker at [employer]   4 children age 56 to 38   Regular exercise-no current exercise regimen   Caffeine Use-no   Social Drivers of Corporate investment banker Strain: Not on file  Food Insecurity: Not on file  Transportation Needs: Not on file  Physical Activity: Not on file  Stress: Not on file  Social Connections: Not on file   Family History  Problem Relation Age of Onset   Diabetes Mother    Hypertension Mother    Hyperlipidemia Mother    Cancer Father        glioblastoma   No Known Allergies Current Outpatient Medications  Medication Sig Dispense Refill   aspirin EC 325 MG  tablet Take 1 tablet (325 mg total) by mouth daily. 14 tablet 0   dapagliflozin propanediol (FARXIGA) 10 MG TABS tablet TAKE 1 TABLET BY MOUTH DAILY BEFORE BREAKFAST. 30 tablet 0   HYDROcodone-acetaminophen (NORCO/VICODIN) 5-325 MG tablet Take 1 tablet by mouth every 6 (six) hours as needed for moderate pain. 20 tablet 0   lisinopril (ZESTRIL) 10 MG tablet TAKE 1 TABLET BY MOUTH EVERY DAY 30 tablet 0   meloxicam (MOBIC) 15 MG tablet Take 1 tablet (15 mg total) by mouth daily. 30 tablet 0   mycophenolate (MYFORTIC) 180 MG EC tablet Take 4 tablets by mouth 2 (two) times daily.     oxyCODONE (ROXICODONE) 5 MG immediate release tablet Take 1 tablet (5 mg total) by mouth every 4 (four) hours as needed for severe pain or breakthrough pain. 20 tablet 0   oxyCODONE (ROXICODONE) 5 MG immediate release tablet Take 1 tablet (5 mg total) by mouth every 4 (four) hours as needed for severe pain or breakthrough pain. 20 tablet 0   oxyCODONE (ROXICODONE) 5 MG immediate release tablet Take 1 tablet (5 mg total) by mouth every 4 (four) hours as needed for severe pain (pain score 7-10) or breakthrough pain. 30 tablet 0   sildenafil (VIAGRA) 100 MG tablet Take 0.5-1 tablets (50-100 mg total) by mouth daily as needed for erectile dysfunction. 15 tablet 6   sitaGLIPtin (JANUVIA) 50 MG tablet Take 50 mg by mouth daily.     tacrolimus (PROGRAF) 1 MG capsule Take 5 mg by mouth 2 (two) times daily. Pt take 5 mg in the morning and 4 mg at night     traMADol (ULTRAM) 50 MG tablet Take 1 tablet (50 mg total) by mouth every 6 (six) hours as needed. 30 tablet 1   traMADol (ULTRAM) 50 MG tablet Take 1 tablet (50 mg total) by mouth every 6 (six) hours as needed. 30 tablet 0   varenicline (CHANTIX) 0.5 MG tablet Take 1 tablet (0.5 mg total) by mouth 2 (two) times daily. 60 tablet 0   varenicline (CHANTIX) 0.5 MG tablet TAKE 1 TABLET BY MOUTH 2 TIMES DAILY. 180 tablet 1   No current facility-administered medications for this visit.    No results found.  Review of Systems:   A ROS was performed including pertinent positives and negatives as documented in the HPI.   Musculoskeletal Exam:    There were no vitals taken for this visit.  Right incision is well-appearing which is subsequently healed..  Range of motion is from 15 degrees to 140 degrees.  Sensation is intact to light touch throughout.  Fires ulnar interosseous nerve distribution as well as radial nerve with strong wrist extension and 2+ radial pulse  Imaging:    X-rays right elbow 3 views: There is loosening of one of the medial screws with improved callus across the fracture line  I personally reviewed and interpreted the radiographs.   Assessment:   Status post right distal humeral open reduction internal fixation with limited range of motion and loss of extension.  At this time he does have some evidence of increased callus formation on x-ray today.  This is consistent with healing.  I did describe that at this point he has having some loosening of one of the screws of the elbow.  I did discuss that I would like to remove his hardware as this appears to be significantly symptomatic.  I did discuss that if there is concern for persistent fracture delayed union that we may need to utilize an additional plate for temporary stabilization although this would be in a much simpler fracture plane than his original injury.  That being said I do feel confident that there is enough healing on his x-ray today that we can proceed with removal of hardware.  I am confident that there is no evidence of infection given his negative inflammatory labs Plan :    -Plan for right elbow distal humerus removal of hardware with ulnar nerve neurolysis   After a lengthy discussion of treatment options, including risks, benefits, alternatives, complications of surgical and nonsurgical conservative options, the patient elected surgical repair.   The patient  is aware of the  material risks  and complications including, but not limited to injury to adjacent structures, neurovascular injury, infection, numbness, bleeding, implant failure, thermal burns, stiffness, persistent pain, failure to heal, disease transmission from allograft, need for further surgery, dislocation, anesthetic risks, blood clots, risks of death,and others. The probabilities of surgical success and failure discussed with patient given their particular co-morbidities.The time and nature of expected rehabilitation and recovery was discussed.The patient's questions were all answered preoperatively.  No barriers to understanding were noted. I explained the natural history of the disease process and Rx rationale.  I explained to the patient what I considered to be reasonable expectations given their personal situation.  The final treatment plan was arrived at through a shared patient decision making process model.   Huel Cote, MD Attending Physician, Orthopedic Surgery  This document was dictated using Dragon voice recognition software. A reasonable attempt at proof reading has been made to minimize errors.

## 2023-10-16 ENCOUNTER — Encounter (HOSPITAL_BASED_OUTPATIENT_CLINIC_OR_DEPARTMENT_OTHER): Payer: Self-pay | Admitting: Orthopaedic Surgery

## 2023-10-19 ENCOUNTER — Telehealth: Payer: Self-pay | Admitting: Orthopaedic Surgery

## 2023-10-19 NOTE — Telephone Encounter (Signed)
 10/16/23 work note faxed to [employer] [phone]/claim number [id number]

## 2023-10-21 LAB — LAB REPORT - SCANNED
A1c: 8.3
Creatinine, POC: 235.4 mg/dL
EGFR: 87

## 2023-10-31 ENCOUNTER — Encounter (HOSPITAL_BASED_OUTPATIENT_CLINIC_OR_DEPARTMENT_OTHER): Payer: Self-pay | Admitting: Orthopaedic Surgery

## 2023-10-31 ENCOUNTER — Other Ambulatory Visit: Payer: Self-pay

## 2023-10-31 NOTE — Progress Notes (Signed)
   10/31/23 1019  PAT Phone Screen  Is the patient taking a GLP-1 receptor agonist? No  Do You Have Diabetes? (S)  Yes (Type 1, had left kidney/pancreas transplant 2014)  Do You Have Hypertension? Yes  Have You Ever Been to the ER for Asthma? No  Have You Taken Oral Steroids in the Past 3 Months? No  Do you Take Phenteramine or any Other Diet Drugs? No  Recent  Lab Work, EKG, CXR? Yes  Where was this test performed? 03-04-23 EKG  Do you have a history of heart problems? (S)  No (2013 ECHO EF 60-65%)  Any Recent Hospitalizations? No  Height 5\' 8"  (1.727 m)  Weight 72.6 kg  Pat Appointment Scheduled (S)  Yes (BMP)

## 2023-11-01 ENCOUNTER — Other Ambulatory Visit: Payer: Self-pay | Admitting: Family Medicine

## 2023-11-02 ENCOUNTER — Encounter (HOSPITAL_BASED_OUTPATIENT_CLINIC_OR_DEPARTMENT_OTHER)
Admission: RE | Admit: 2023-11-02 | Discharge: 2023-11-02 | Disposition: A | Discharge status: Home / Self Care | Source: Ambulatory Visit | Attending: Orthopaedic Surgery | Admitting: Orthopaedic Surgery

## 2023-11-02 DIAGNOSIS — Z01812 Encounter for preprocedural laboratory examination: Secondary | ICD-10-CM | POA: Diagnosis present

## 2023-11-02 LAB — BASIC METABOLIC PANEL WITH GFR
Anion gap: 8 (ref 5–15)
BUN: 24 mg/dL — ABNORMAL HIGH (ref 6–20)
CO2: 23 mmol/L (ref 22–32)
Calcium: 9.6 mg/dL (ref 8.9–10.3)
Chloride: 103 mmol/L (ref 98–111)
Creatinine, Ser: 1.2 mg/dL (ref 0.61–1.24)
GFR, Estimated: >60 mL/min (ref >60)
Glucose, Bld: 186 mg/dL — ABNORMAL HIGH (ref 70–99)
Potassium: 5.7 mmol/L — ABNORMAL HIGH (ref 3.5–5.1)
Sodium: 134 mmol/L — ABNORMAL LOW (ref 135–145)

## 2023-11-02 NOTE — Progress Notes (Signed)

## 2023-11-06 NOTE — Progress Notes (Signed)
 K 5.7 in recent labwork. Spoke with Dr Desmond Lope (anesthesia) who wants to repeat stat on DOS. Discussed/approved by Shanda Bumps [facility] supervisor) for DOS labs. Will notify patient to arrive 30 minutes earlier.

## 2023-11-07 ENCOUNTER — Other Ambulatory Visit: Payer: Self-pay

## 2023-11-07 ENCOUNTER — Ambulatory Visit (HOSPITAL_BASED_OUTPATIENT_CLINIC_OR_DEPARTMENT_OTHER): Payer: Self-pay | Admitting: Anesthesiology

## 2023-11-07 ENCOUNTER — Encounter (HOSPITAL_BASED_OUTPATIENT_CLINIC_OR_DEPARTMENT_OTHER)
Admission: RE | Disposition: A | Discharge status: Home / Self Care | Payer: Self-pay | Source: Home / Self Care | Attending: Orthopaedic Surgery

## 2023-11-07 ENCOUNTER — Ambulatory Visit (HOSPITAL_BASED_OUTPATIENT_CLINIC_OR_DEPARTMENT_OTHER): Payer: Worker's Compensation

## 2023-11-07 ENCOUNTER — Other Ambulatory Visit: Payer: Self-pay | Admitting: Family Medicine

## 2023-11-07 ENCOUNTER — Ambulatory Visit (HOSPITAL_BASED_OUTPATIENT_CLINIC_OR_DEPARTMENT_OTHER)
Admission: RE | Admit: 2023-11-07 | Discharge: 2023-11-07 | Disposition: A | Discharge status: Home / Self Care | Payer: Worker's Compensation | Attending: Orthopaedic Surgery | Admitting: Orthopaedic Surgery

## 2023-11-07 ENCOUNTER — Encounter (HOSPITAL_BASED_OUTPATIENT_CLINIC_OR_DEPARTMENT_OTHER): Payer: Self-pay | Admitting: Orthopaedic Surgery

## 2023-11-07 DIAGNOSIS — Z419 Encounter for procedure for purposes other than remedying health state, unspecified: Secondary | ICD-10-CM

## 2023-11-07 DIAGNOSIS — E109 Type 1 diabetes mellitus without complications: Secondary | ICD-10-CM

## 2023-11-07 DIAGNOSIS — I120 Hypertensive chronic kidney disease with stage 5 chronic kidney disease or end stage renal disease: Secondary | ICD-10-CM | POA: Diagnosis not present

## 2023-11-07 DIAGNOSIS — T84038A Mechanical loosening of other internal prosthetic joint, initial encounter: Secondary | ICD-10-CM | POA: Diagnosis present

## 2023-11-07 DIAGNOSIS — Z9483 Pancreas transplant status: Secondary | ICD-10-CM | POA: Diagnosis not present

## 2023-11-07 DIAGNOSIS — S42491A Other displaced fracture of lower end of right humerus, initial encounter for closed fracture: Secondary | ICD-10-CM | POA: Diagnosis not present

## 2023-11-07 DIAGNOSIS — E119 Type 2 diabetes mellitus without complications: Secondary | ICD-10-CM

## 2023-11-07 DIAGNOSIS — I10 Essential (primary) hypertension: Secondary | ICD-10-CM | POA: Diagnosis not present

## 2023-11-07 DIAGNOSIS — E1022 Type 1 diabetes mellitus with diabetic chronic kidney disease: Secondary | ICD-10-CM | POA: Diagnosis not present

## 2023-11-07 DIAGNOSIS — Z940 Kidney transplant status: Secondary | ICD-10-CM | POA: Diagnosis not present

## 2023-11-07 DIAGNOSIS — E1042 Type 1 diabetes mellitus with diabetic polyneuropathy: Secondary | ICD-10-CM | POA: Insufficient documentation

## 2023-11-07 DIAGNOSIS — N186 End stage renal disease: Secondary | ICD-10-CM | POA: Diagnosis not present

## 2023-11-07 DIAGNOSIS — Z7984 Long term (current) use of oral hypoglycemic drugs: Secondary | ICD-10-CM | POA: Insufficient documentation

## 2023-11-07 DIAGNOSIS — Y831 Surgical operation with implant of artificial internal device as the cause of abnormal reaction of the patient, or of later complication, without mention of misadventure at the time of the procedure: Secondary | ICD-10-CM | POA: Insufficient documentation

## 2023-11-07 DIAGNOSIS — F17210 Nicotine dependence, cigarettes, uncomplicated: Secondary | ICD-10-CM | POA: Diagnosis not present

## 2023-11-07 HISTORY — PX: HARDWARE REMOVAL: SHX979

## 2023-11-07 HISTORY — PX: CAPSULOTOMY: SHX379

## 2023-11-07 LAB — GLUCOSE, CAPILLARY
Glucose-Capillary: 165 mg/dL — ABNORMAL HIGH (ref 70–99)
Glucose-Capillary: 178 mg/dL — ABNORMAL HIGH (ref 70–99)
Glucose-Capillary: 160 mg/dL — ABNORMAL HIGH (ref 70–99)

## 2023-11-07 LAB — BASIC METABOLIC PANEL WITH GFR
Anion gap: 13 (ref 5–15)
BUN: 22 mg/dL — ABNORMAL HIGH (ref 6–20)
CO2: 18 mmol/L — ABNORMAL LOW (ref 22–32)
Calcium: 9.4 mg/dL (ref 8.9–10.3)
Chloride: 105 mmol/L (ref 98–111)
Creatinine, Ser: 1.16 mg/dL (ref 0.61–1.24)
GFR, Estimated: >60 mL/min (ref >60)
Glucose, Bld: 162 mg/dL — ABNORMAL HIGH (ref 70–99)
Potassium: 4.9 mmol/L (ref 3.5–5.1)
Sodium: 136 mmol/L (ref 135–145)

## 2023-11-07 SURGERY — REMOVAL, HARDWARE
Anesthesia: General | Site: Elbow | Laterality: Right

## 2023-11-07 MED ORDER — CEFAZOLIN SODIUM-DEXTROSE 2-4 GM/100ML-% IV SOLN
2.0000 g | INTRAVENOUS | Status: AC
  Administered 2023-11-07: 2 g via INTRAVENOUS

## 2023-11-07 MED ORDER — AMISULPRIDE (ANTIEMETIC) 5 MG/2ML IV SOLN: 10.0000 mg | Freq: Once | INTRAVENOUS | Status: DC | PRN

## 2023-11-07 MED ORDER — ROCURONIUM BROMIDE 10 MG/ML (PF) SYRINGE
PREFILLED_SYRINGE | INTRAVENOUS | Status: AC
  Filled 2023-11-07: qty 10

## 2023-11-07 MED ORDER — FENTANYL CITRATE (PF) 100 MCG/2ML IJ SOLN: 25.0000 ug | INTRAMUSCULAR | Status: DC | PRN

## 2023-11-07 MED ORDER — OXYCODONE HCL 5 MG/5ML PO SOLN: 5.0000 mg | Freq: Once | ORAL | Status: DC | PRN

## 2023-11-07 MED ORDER — LIDOCAINE HCL (CARDIAC) PF 100 MG/5ML IV SOSY
PREFILLED_SYRINGE | INTRAVENOUS | Status: DC | PRN
  Administered 2023-11-07: 60 mg via INTRAVENOUS

## 2023-11-07 MED ORDER — LIDOCAINE 2% (20 MG/ML) 5 ML SYRINGE
INTRAMUSCULAR | Status: AC
  Filled 2023-11-07: qty 20

## 2023-11-07 MED ORDER — EPHEDRINE SULFATE (PRESSORS) 50 MG/ML IJ SOLN
INTRAMUSCULAR | Status: DC | PRN
  Administered 2023-11-07: 10 mg via INTRAVENOUS

## 2023-11-07 MED ORDER — ROCURONIUM BROMIDE 100 MG/10ML IV SOLN
INTRAVENOUS | Status: DC | PRN
  Administered 2023-11-07: 40 mg via INTRAVENOUS

## 2023-11-07 MED ORDER — BUPIVACAINE-EPINEPHRINE (PF) 0.5% -1:200000 IJ SOLN
INTRAMUSCULAR | Status: DC | PRN
Start: 2023-11-07 — End: 2023-11-07
  Administered 2023-11-07: 30 mL via PERINEURAL

## 2023-11-07 MED ORDER — 0.9 % SODIUM CHLORIDE (POUR BTL) OPTIME
TOPICAL | Status: DC | PRN
  Administered 2023-11-07: 1000 mL

## 2023-11-07 MED ORDER — IBUPROFEN 800 MG PO TABS: 800.0000 mg | ORAL_TABLET | Freq: Three times a day (TID) | ORAL | 0 refills | Status: AC

## 2023-11-07 MED ORDER — SODIUM CHLORIDE 0.9 % IV SOLN: INTRAVENOUS | Status: DC | PRN

## 2023-11-07 MED ORDER — FENTANYL CITRATE (PF) 100 MCG/2ML IJ SOLN
INTRAMUSCULAR | Status: AC
  Filled 2023-11-07: qty 2

## 2023-11-07 MED ORDER — DEXAMETHASONE SODIUM PHOSPHATE 10 MG/ML IJ SOLN
INTRAMUSCULAR | Status: AC
  Filled 2023-11-07: qty 2

## 2023-11-07 MED ORDER — MIDAZOLAM HCL 2 MG/2ML IJ SOLN
INTRAMUSCULAR | Status: AC
  Filled 2023-11-07: qty 2

## 2023-11-07 MED ORDER — ACETAMINOPHEN 500 MG PO TABS
ORAL_TABLET | ORAL | Status: AC
  Filled 2023-11-07: qty 2

## 2023-11-07 MED ORDER — GABAPENTIN 300 MG PO CAPS
ORAL_CAPSULE | ORAL | Status: AC
  Filled 2023-11-07: qty 1

## 2023-11-07 MED ORDER — OXYCODONE HCL 5 MG PO TABS: 5.0000 mg | ORAL_TABLET | Freq: Once | ORAL | Status: DC | PRN

## 2023-11-07 MED ORDER — LACTATED RINGERS IV SOLN: INTRAVENOUS | Status: DC

## 2023-11-07 MED ORDER — CEFAZOLIN SODIUM-DEXTROSE 2-4 GM/100ML-% IV SOLN
INTRAVENOUS | Status: AC
  Filled 2023-11-07: qty 100

## 2023-11-07 MED ORDER — TRANEXAMIC ACID-NACL 1000-0.7 MG/100ML-% IV SOLN
INTRAVENOUS | Status: AC
  Filled 2023-11-07: qty 100

## 2023-11-07 MED ORDER — ACETAMINOPHEN 500 MG PO TABS
1000.0000 mg | ORAL_TABLET | Freq: Once | ORAL | Status: AC
  Administered 2023-11-07: 1000 mg via ORAL

## 2023-11-07 MED ORDER — GABAPENTIN 300 MG PO CAPS
300.0000 mg | ORAL_CAPSULE | Freq: Once | ORAL | Status: AC
  Administered 2023-11-07: 300 mg via ORAL

## 2023-11-07 MED ORDER — ONDANSETRON HCL 4 MG/2ML IJ SOLN
INTRAMUSCULAR | Status: AC
  Filled 2023-11-07: qty 6

## 2023-11-07 MED ORDER — PROPOFOL 10 MG/ML IV BOLUS
INTRAVENOUS | Status: DC | PRN
  Administered 2023-11-07: 200 mg via INTRAVENOUS
  Administered 2023-11-07: 20 mg via INTRAVENOUS

## 2023-11-07 MED ORDER — MIDAZOLAM HCL 2 MG/2ML IJ SOLN
2.0000 mg | Freq: Once | INTRAMUSCULAR | Status: AC
  Administered 2023-11-07: 2 mg via INTRAVENOUS

## 2023-11-07 MED ORDER — ACETAMINOPHEN 500 MG PO TABS: 500.0000 mg | ORAL_TABLET | Freq: Three times a day (TID) | ORAL | 0 refills | Status: DC

## 2023-11-07 MED ORDER — FENTANYL CITRATE (PF) 100 MCG/2ML IJ SOLN
INTRAMUSCULAR | Status: DC | PRN
  Administered 2023-11-07: 25 ug via INTRAVENOUS
  Administered 2023-11-07: 50 ug via INTRAVENOUS
  Administered 2023-11-07: 25 ug via INTRAVENOUS

## 2023-11-07 MED ORDER — OXYCODONE HCL 5 MG PO TABS: 5.0000 mg | ORAL_TABLET | ORAL | 0 refills | Status: DC | PRN

## 2023-11-07 MED ORDER — TRANEXAMIC ACID-NACL 1000-0.7 MG/100ML-% IV SOLN
1000.0000 mg | INTRAVENOUS | Status: AC
  Administered 2023-11-07: 1000 mg via INTRAVENOUS

## 2023-11-07 SURGICAL SUPPLY — 39 items
BENZOIN TINCTURE PRP APPL 2/3 (GAUZE/BANDAGES/DRESSINGS) IMPLANT
BLADE SURG 15 STRL LF DISP TIS (BLADE) ×1 IMPLANT
BNDG ELASTIC 4INX 5YD STR LF (GAUZE/BANDAGES/DRESSINGS) ×1 IMPLANT
CHLORAPREP W/TINT 26 (MISCELLANEOUS) ×1 IMPLANT
CLSR STERI-STRIP ANTIMIC 1/2X4 (GAUZE/BANDAGES/DRESSINGS) IMPLANT
DRAPE EXTREMITY T 121X128X90 (DISPOSABLE) IMPLANT
DRAPE IMP U-DRAPE 54X76 (DRAPES) IMPLANT
DRAPE INCISE IOBAN 66X45 STRL (DRAPES) IMPLANT
DRAPE OEC MINIVIEW 54X84 (DRAPES) IMPLANT
DRAPE U-SHAPE 47X51 STRL (DRAPES) ×2 IMPLANT
DRSG TEGADERM 4X4.75 (GAUZE/BANDAGES/DRESSINGS) IMPLANT
ELECT REM PT RETURN 9FT ADLT (ELECTROSURGICAL) ×1 IMPLANT
ELECTRODE REM PT RTRN 9FT ADLT (ELECTROSURGICAL) ×1 IMPLANT
GAUZE PAD ABD 8X10 STRL (GAUZE/BANDAGES/DRESSINGS) ×1 IMPLANT
GAUZE SPONGE 4X4 12PLY STRL (GAUZE/BANDAGES/DRESSINGS) ×1 IMPLANT
GAUZE XEROFORM 1X8 LF (GAUZE/BANDAGES/DRESSINGS) ×1 IMPLANT
GLOVE BIO SURGEON STRL SZ 6 (GLOVE) ×2 IMPLANT
GLOVE BIO SURGEON STRL SZ7.5 (GLOVE) ×1 IMPLANT
GLOVE BIOGEL PI IND STRL 6.5 (GLOVE) ×1 IMPLANT
GLOVE BIOGEL PI IND STRL 8 (GLOVE) ×1 IMPLANT
GOWN STRL REUS W/ TWL LRG LVL3 (GOWN DISPOSABLE) ×2 IMPLANT
GOWN STRL REUS W/TWL XL LVL3 (GOWN DISPOSABLE) ×1 IMPLANT
PACK ARTHROSCOPY DSU (CUSTOM PROCEDURE TRAY) ×1 IMPLANT
PACK BASIN DAY SURGERY FS (CUSTOM PROCEDURE TRAY) ×1 IMPLANT
PENCIL SMOKE EVACUATOR (MISCELLANEOUS) ×1 IMPLANT
SHEET MEDIUM DRAPE 40X70 STRL (DRAPES) ×1 IMPLANT
SLEEVE SCD COMPRESS KNEE MED (STOCKING) ×1 IMPLANT
SLING ARM FOAM STRAP LRG (SOFTGOODS) IMPLANT
SPONGE T-LAP 18X18 ~~LOC~~+RFID (SPONGE) IMPLANT
SPONGE T-LAP 4X18 ~~LOC~~+RFID (SPONGE) ×1 IMPLANT
SUCTION TUBE FRAZIER 10FR DISP (SUCTIONS) ×1 IMPLANT
SUT ETHILON 3 0 PS 1 (SUTURE) IMPLANT
SUT MNCRL AB 3-0 PS2 27 (SUTURE) IMPLANT
SUT VIC AB 0 CT1 27XBRD ANBCTR (SUTURE) IMPLANT
SUT VIC AB 2-0 SH 27XBRD (SUTURE) IMPLANT
SYR BULB EAR ULCER 3OZ GRN STR (SYRINGE) ×1 IMPLANT
TOWEL GREEN STERILE FF (TOWEL DISPOSABLE) ×2 IMPLANT
TUBE CONNECTING 20X1/4 (TUBING) IMPLANT
YANKAUER SUCT BULB TIP NO VENT (SUCTIONS) IMPLANT

## 2023-11-07 NOTE — Op Note (Signed)
 Date of Surgery: 11/07/2023  INDICATIONS: Mark Miranda is a 53 y.o.-year-old male with right distal humeral symptomatic hardware.  The risk and benefits of the procedure were discussed in detail and documented in the pre-operative evaluation.   PREOPERATIVE DIAGNOSIS: 1. Right symptomatic distal humeral hardware  POSTOPERATIVE DIAGNOSIS: Same.  PROCEDURE: 1. Right distal humeral removal of hardware 2. Right elbow ulnar nerve neurolysis  SURGEON: Benancio Deeds MD  ASSISTANT: Ardeen Fillers, ATC  ANESTHESIA:  general  IV FLUIDS AND URINE: See anesthesia record.  ANTIBIOTICS: Ancef  ESTIMATED BLOOD LOSS: 25 mL.  IMPLANTS:  Implant Name Type Inv. Item Serial No. Manufacturer Lot No. LRB No. Used Action  PLATE HUM EVOS 1.6X09 3H - [id number] Plate PLATE HUM EVOS 9.1Y78 3H  [employer]  Right 1 Explanted  PLATE HUM EVOS 6H R 2.7X85 - [id number] Plate PLATE HUM EVOS 6H R 2.7X85  [employer]  Right 1 Explanted  SCREW CORT 2.7X18 T8 ST EVOS - [id number] Screw SCREW CORT 2.7X18 T8 ST EVOS  [employer]  Right 1 Explanted  SCREW CORT 2.7X22 T8 ST EVOS - [id number] Screw SCREW CORT 2.7X22 T8 ST EVOS  [employer]  Right 3 Explanted  SCREW CORT 2.7X38 T8 ST EVOS - [id number] Screw SCREW CORT 2.7X38 T8 ST EVOS  [employer]  Right 1 Explanted  SCREW CORT 2.7X42 ST VA EVOS - [id number] Screw SCREW CORT 2.7X42 ST VA EVOS  [employer]  Right 1 Explanted  SCREW CORT 3.5X20 ST EVOS - [id number] Screw SCREW CORT 3.5X20 ST EVOS  [employer]  Right 2 Explanted  SCREW CORT EVOS ST 3.5X28 - [id number] Screw SCREW CORT EVOS ST 3.5X28  [employer]  Right 1 Explanted  SCREW CORT 3.5X22 ST EVOS - [id number] Screw SCREW CORT 3.5X22 ST EVOS  [employer]  Right 1 Explanted  SCREW CORT 3.5X30 ST EVOS - [id number] Screw SCREW CORT 3.5X30 ST EVOS  [employer]  Right 1 Explanted  SCREW CORT VA EVOS 2.7X36 - [id number] Screw SCREW CORT VA EVOS 2.7X36  [employer]  Right 1 Explanted  SCREW CORT VA EVOS 2.7X44 - [id number] Screw SCREW CORT VA EVOS 2.7X44  [employer]  Right 1 Explanted  SCREW CORT VA EVOS 2.7X50 - [id number] Screw SCREW CORT VA EVOS 2.7X50  [employer]  Right 1 Explanted    DRAINS: None  CULTURES: None  COMPLICATIONS: none  DESCRIPTION OF PROCEDURE:   Patient was identified in the preoperative holding area.  Correct site was marked according universal protocol with nursing.  He is subsequently taken back to the operating room.  Antibiotics were given 1 hour prior to skin incision.  He was subsequently transferred over the operating room table.  Anesthesia was induced.  He was placed in the lateral decubitus position with all bony prominences padded.  The right arm was placed over the arm holder radiolucent.  Final timeout was performed.  15 blade was used to size through skin over the previous midline incision.  Electrocautery was used to dissect through the deep dermal tissue.  Layer by layer dissection was performed with Metzenbaum scissors.  The medial aspect and interval was developed.  The ulnar nerve was identified with significant scar tissue around this.  Neurolysis was performed by using Metzenbaum scissors to circumferentially remove the scar tissue around the ulnar nerve.  This  was identified and mobilized.  This time the medial hardware was identified.  The screws were removed.  The plate was removed.  I was happy with this.  Laterally the interval was identified and electrocautery was used to remove scar tissue for the plate.  The screws were removed and the plate was removed.  The free lateral screw was also removed.  This time the wounds were thoroughly irrigated and closed in layers of 0 Vicryl 2-0 Vicryl and 3-0 nylon and all counts were correct at the end the  case.  Dressing was applied with Xeroform gauze Webril Ace wrap he is taken the PACU without complication.    POSTOPERATIVE PLAN: He will be weightbearing and activity as tolerated on the right arm.  I will see him back in 2 weeks for suture removal.   Benancio Deeds, MD 2:15 PM

## 2023-11-07 NOTE — Brief Op Note (Signed)
   Brief Op Note  Date of Surgery: 11/07/2023  Preoperative Diagnosis: RIGHT ELBOW SYMPTOMATIC HARDWARE  Postoperative Diagnosis: same  Procedure: Procedure(s): REMOVAL, HARDWARE CAPSULOTOMY, ELBOW  Implants: Implant Name Type Inv. Item Serial No. Manufacturer Lot No. LRB No. Used Action  PLATE HUM EVOS 1.6X09 3H - [id number] Plate PLATE HUM EVOS 9.1Y78 3H  [employer]  Right 1 Explanted  PLATE HUM EVOS 6H R 2.7X85 - [id number] Plate PLATE HUM EVOS 6H R 2.7X85  [employer]  Right 1 Explanted  SCREW CORT 2.7X18 T8 ST EVOS - [id number] Screw SCREW CORT 2.7X18 T8 ST EVOS  [employer]  Right 1 Explanted  SCREW CORT 2.7X22 T8 ST EVOS - [id number] Screw SCREW CORT 2.7X22 T8 ST EVOS  [employer]  Right 3 Explanted  SCREW CORT 2.7X38 T8 ST EVOS - [id number] Screw SCREW CORT 2.7X38 T8 ST EVOS  [employer]  Right 1 Explanted  SCREW CORT 2.7X42 ST VA EVOS - [id number] Screw SCREW CORT 2.7X42 ST VA EVOS  [employer]  Right 1 Explanted  SCREW CORT 3.5X20 ST EVOS - [id number] Screw SCREW CORT 3.5X20 ST EVOS  [employer]  Right 2 Explanted  SCREW CORT EVOS ST 3.5X28 - [id number] Screw SCREW CORT EVOS ST 3.5X28  [employer]  Right 1 Explanted  SCREW CORT 3.5X22 ST EVOS - [id number] Screw SCREW CORT 3.5X22 ST EVOS  [employer]  Right 1 Explanted  SCREW CORT 3.5X30 ST EVOS - [id number] Screw SCREW CORT 3.5X30 ST EVOS  [employer]  Right 1 Explanted  SCREW CORT VA EVOS 2.7X36 - [id number] Screw SCREW CORT VA EVOS 2.7X36  [employer]  Right 1 Explanted  SCREW CORT VA EVOS 2.7X44 - [id number] Screw SCREW CORT VA EVOS 2.7X44  [employer]  Right 1 Explanted  SCREW CORT VA EVOS 2.7X50 - [id number] Screw SCREW CORT VA EVOS 2.7X50  [employer]  Right 1 Explanted     Surgeons: Surgeon(s): Huel Cote, MD  Anesthesia: General    Estimated Blood Loss: See anesthesia record  Complications: None  Condition to PACU: Stable  Benancio Deeds, MD 11/07/2023 2:15 PM

## 2023-11-07 NOTE — Anesthesia Procedure Notes (Signed)
 Anesthesia Regional Block: Supraclavicular block   Pre-Anesthetic Checklist: , timeout performed,  Correct Patient, Correct Site, Correct Laterality,  Correct Procedure, Correct Position, site marked,  Risks and benefits discussed,  Surgical consent,  Pre-op evaluation,  At surgeon's request and post-op pain management  Laterality: Right  Prep: chloraprep       Needles:  Injection technique: Single-shot  Needle Type: Echogenic Stimulator Needle     Needle Length: 9cm  Needle Gauge: 21     Additional Needles:   Procedures:,,,, ultrasound used (permanent image in chart),,    Narrative:  Start time: 11/07/2023 11:20 AM End time: 11/07/2023 11:30 AM Injection made incrementally with aspirations every 5 mL.  Performed by: Personally  Anesthesiologist: Leonides Grills, MD  Additional Notes: Functioning IV was confirmed and monitors were applied.  A timeout was performed. Sterile prep, hand hygiene and sterile gloves were used. A 90mm 21ga Arrow echogenic stimulator needle was used. Negative aspiration and negative test dose prior to incremental administration of local anesthetic. The patient tolerated the procedure well.  Ultrasound guidance: relevent anatomy identified, needle position confirmed, local anesthetic spread visualized around nerve(s), vascular puncture avoided.  Image printed for medical record.

## 2023-11-07 NOTE — Discharge Instructions (Addendum)
 Discharge Instructions    Attending Surgeon: Huel Cote, MD Office Phone Number: [phone]   Diagnosis and Procedures:    Surgeries Performed: Right distal humeral removal of hardware  Discharge Plan:    Diet: Resume usual diet. Begin with light or bland foods.  Drink plenty of fluids.  Activity:  Keep sling and dressing in place until your nerve block wears off You are advised to go home directly from the hospital or surgical center. Restrict your activities.  GENERAL INSTRUCTIONS: 1.  Keep your surgical site elevated above your heart for at least 5-7 days or longer to prevent swelling. This will improve your comfort and your overall recovery following surgery.     2. Please call Dr. Serena Croissant office at [phone] with questions Monday-Friday during business hours. If no one answers, please leave a message and someone should get back to the patient within 24 hours. For emergencies please call 911 or proceed to the emergency room.   3. Patient to notify surgical team if experiences any of the following: Bowel/Bladder dysfunction, uncontrolled pain, nerve/muscle weakness, incision with increased drainage or redness, nausea/vomiting and Fever greater than 101.0 F.  Be alert for signs of infection including redness, streaking, odor, fever or chills. Be alert for excessive pain or bleeding and notify your surgeon immediately.  WOUND INSTRUCTIONS:   Leave your dressing/cast/splint in place until your post operative visit.  Keep it clean and dry.  Always keep the incision clean and dry until the staples/sutures are removed. If there is no drainage from the incision you should keep it open to air. If there is drainage from the incision you must keep it covered at all times until the drainage stops  Do not soak in a bath tub, hot tub, pool, lake or other body of water until 21 days after your surgery and your incision is completely dry and healed.  If you have removable  sutures (or staples) they must be removed 10-14 days (unless otherwise instructed) from the day of your surgery.     1)  Elevate the extremity as much as possible.  2)  Keep the dressing clean and dry.  3)  Please call us if the dressing becomes wet or dirty.  4)  If you are experiencing worsening pain or worsening swelling, please call.     MEDICATIONS: Resume all previous home medications at the previous prescribed dose and frequency unless otherwise noted Start taking the  pain medications on an as-needed basis as prescribed  Please taper down pain medication over the next week following surgery.  Ideally you should not require a refill of any narcotic pain medication.  Take pain medication with food to minimize nausea. In addition to the prescribed pain medication, you may take over-the-counter pain relievers such as Tylenol.  Do NOT take additional tylenol if your pain medication already has tylenol in it.  Aspirin 325mg  daily per bottle instructions. Narcotic Policy: Per [facility] clinic policy, our goal is ensure optimal postoperative pain control with a multimodal pain management strategy. For all [facility] patients, our goal is to wean post-operative narcotic medications by 6 weeks post-operatively, and many times sooner. If this is not possible due to utilization of pain medication prior to surgery, your [facility] doctor will support your acute post-operative pain control for the first 6 weeks postoperatively, with a plan to transition you back to your primary pain team following that. Cyndia Skeeters will work to ensure a Therapist, occupational.  FOLLOWUP INSTRUCTIONS: 1. Follow up at the Physical Therapy Clinic 3-4 days following surgery. This appointment should be scheduled unless other arrangements have been made.The Physical Therapy scheduling number is [phone] if an appointment has not already been arranged.  2. Contact Dr. Serena Croissant office during office hours at [phone] or  the practice after hours line at [phone] for non-emergencies. For medical emergencies call 911.   Discharge Location: Home  No Tylenol until 2:30pm, if needed

## 2023-11-07 NOTE — Interval H&P Note (Signed)
 History and Physical Interval Note:  11/07/2023 10:39 AM  Mark Miranda  has presented today for surgery, with the diagnosis of RIGHT ELBOW SYMPTOMATIC HARDWARE.  The various methods of treatment have been discussed with the patient and family. After consideration of risks, benefits and other options for treatment, the patient has consented to  Procedure(s) with comments: REMOVAL, HARDWARE (Right) CAPSULOTOMY, ELBOW (Right) - RIGHT ELBOW CAPSULAR RELEASE as a surgical intervention.  The patient's history has been reviewed, patient examined, no change in status, stable for surgery.  I have reviewed the patient's chart and labs.  Questions were answered to the patient's satisfaction.     Huel Cote

## 2023-11-07 NOTE — Transfer of Care (Signed)
 Immediate Anesthesia Transfer of Care Note  Patient: Mark Miranda  Procedure(s) Performed: REMOVAL, HARDWARE (Right: Elbow) CAPSULOTOMY, ELBOW (Right: Elbow)  Patient Location: PACU  Anesthesia Type:General  Level of Consciousness: awake, alert , and patient cooperative  Airway & Oxygen Therapy: Patient Spontanous Breathing and Patient connected to nasal cannula oxygen  Post-op Assessment: Report given to RN and Post -op Vital signs reviewed and stable  Post vital signs: Reviewed and stable  Last Vitals:  Vitals Value Taken Time  BP 155/86 11/07/23 1411  Temp    Pulse 83 11/07/23 1413  Resp 19 11/07/23 1413  SpO2 93 % 11/07/23 1413  Vitals shown include unfiled device data.  Last Pain:  Vitals:   11/07/23 0829  TempSrc: Temporal  PainSc: 3       Patients Stated Pain Goal: 6 (11/07/23 0829)  Complications: No notable events documented.

## 2023-11-07 NOTE — Anesthesia Preprocedure Evaluation (Addendum)
 Anesthesia Evaluation  Patient identified by MRN, date of birth, ID band Patient awake    Reviewed: Allergy & Precautions, NPO status , Patient's Chart, lab work & pertinent test results  Airway Mallampati: II  TM Distance: >3 FB Neck ROM: Full    Dental  (+) Chipped, Missing   Pulmonary Current SmokerPatient did not abstain from smoking.   Pulmonary exam normal        Cardiovascular hypertension, Pt. on medications Normal cardiovascular exam     Neuro/Psych  PSYCHIATRIC DISORDERS  Depression     Neuromuscular disease    GI/Hepatic negative GI ROS, Neg liver ROS,,,  Endo/Other  diabetes, Oral Hypoglycemic Agents    Renal/GU Renal diseaseHistory of simultaneous kidney and pancreas transplant     Musculoskeletal Motor and sensory function intact to the right hand   Abdominal   Peds  Hematology negative hematology ROS (+)   Anesthesia Other Findings RIGHT ELBOW SYMPTOMATIC HARDWARE  Reproductive/Obstetrics                             Anesthesia Physical Anesthesia Plan  ASA: 3  Anesthesia Plan: General and Regional   Post-op Pain Management: Regional block*   Induction: Intravenous  PONV Risk Score and Plan: 1 and Ondansetron, Dexamethasone, Midazolam and Treatment may vary due to age or medical condition  Airway Management Planned: LMA  Additional Equipment:   Intra-op Plan:   Post-operative Plan: Extubation in OR  Informed Consent: I have reviewed the patients History and Physical, chart, labs and discussed the procedure including the risks, benefits and alternatives for the proposed anesthesia with the patient or authorized representative who has indicated his/her understanding and acceptance.     Dental advisory given  Plan Discussed with: CRNA and Surgeon  Anesthesia Plan Comments: (Regional anesthesia performed per surgeon request)       Anesthesia Quick  Evaluation

## 2023-11-07 NOTE — Progress Notes (Signed)
Assisted Dr. Ellender with right, interscalene , ultrasound guided block. Side rails up, monitors on throughout procedure. See vital signs in flow sheet. Tolerated Procedure well. ?

## 2023-11-07 NOTE — Anesthesia Postprocedure Evaluation (Signed)
 Anesthesia Post Note  Patient: Rajat Staver  Procedure(s) Performed: REMOVAL, HARDWARE (Right: Elbow) CAPSULOTOMY, ELBOW (Right: Elbow)     Patient location during evaluation: PACU Anesthesia Type: General Level of consciousness: sedated and patient cooperative Pain management: pain level controlled Vital Signs Assessment: post-procedure vital signs reviewed and stable Respiratory status: spontaneous breathing Cardiovascular status: stable Anesthetic complications: no   No notable events documented.  Last Vitals:  Vitals:   11/07/23 1430 11/07/23 1445  BP: (!) 155/89 (!) 151/83  Pulse: 79   Resp: 18 16  Temp:  (!) 36.4 C  SpO2: 94% 95%    Last Pain:  Vitals:   11/07/23 1445  TempSrc:   PainSc: 0-No pain                 Lewie Loron

## 2023-11-07 NOTE — Anesthesia Procedure Notes (Signed)
 Procedure Name: Intubation Date/Time: 11/07/2023 12:52 PM  Performed by: Earmon Phoenix, CRNAPre-anesthesia Checklist: Patient identified, Emergency Drugs available, Suction available, Timeout performed and Patient being monitored Patient Re-evaluated:Patient Re-evaluated prior to induction Oxygen Delivery Method: Circle system utilized Preoxygenation: Pre-oxygenation with 100% oxygen Induction Type: IV induction Ventilation: Mask ventilation without difficulty Laryngoscope Size: Mac and 3 Grade View: Grade I Tube type: Oral Tube size: 7.0 mm Number of attempts: 1 Airway Equipment and Method: Stylet Placement Confirmation: ETT inserted through vocal cords under direct vision, positive ETCO2 and breath sounds checked- equal and bilateral Secured at: 22 cm Tube secured with: Tape Dental Injury: Teeth and Oropharynx as per pre-operative assessment

## 2023-11-08 ENCOUNTER — Encounter (HOSPITAL_BASED_OUTPATIENT_CLINIC_OR_DEPARTMENT_OTHER): Payer: Self-pay | Admitting: Orthopaedic Surgery

## 2023-11-10 NOTE — Progress Notes (Signed)
 HPI: [name] is a 53 y.o. male with a PMHx significant for  vitamin D deficiency, HTN, DM II, chronic fatigue, chronic pain, ESRD S/P kidney/pancreas transplant in 2014, who is here today for chronic disease management.  Last seen on 07/22/2022  He is still following with the transplant clinic (Dr. Zetta Bills) and sees them every 6 months. He last saw them in February.   Hypertension:  Medications: Currently on lisinopril 10 mg daily. BP readings at home: Not checking his blood pressure regularly at home.  Negative for unusual or severe headache, visual changes, exertional chest pain, dyspnea,  focal weakness, or edema.  Lab Results  Component Value Date   CREATININE 1.16 11/07/2023   BUN 22 (H) 11/07/2023   NA 136 11/07/2023   K 4.9 11/07/2023   CL 105 11/07/2023   CO2 18 (L) 11/07/2023   Hyperlipidemia: Not currently on pharmacologic treatment.  Side effects from medication: none Lab Results  Component Value Date   CHOL 207 (A) 08/06/2021   HDL 29.50 (L) 07/17/2012   LDLCALC 143 08/06/2021   LDLDIRECT 142.0 07/17/2012   TRIG 97 08/06/2021   CHOLHDL 7 07/17/2012   Diabetes Mellitus II: Initially type I S/P pancreatic transplant.Initially he was normoglycemic but HgA1C has gradually increased. - Checking BG at home: He is not checking his blood sugar at home.  - Medications: Currently on Januvia 100 mg daily and Glipizide 5 mg daily. He has only been taking the Glipizide for about 3weeks ago. - He is not longer following with endocrinologist. - eye exam: overdue - foot exam: Performed today. He mentions he has occasional tingling in his feet. - Negative for symptoms of hypoglycemia, polyuria, polydipsia,foot ulcers/trauma  Last HgA1C done on 10/21/23, it was 8.3, so Glipizide was added. Lab Results  Component Value Date   HGBA1C 6.6 (A) 07/22/2022   Lab Results  Component Value Date   MICROALBUR 207.00 (H) 04/15/2008   Smoking hx:  He has smoked for 22  years. At the maximum (~8 years) he was smoking two packs per day. Currently he is smoking less than one pack per day Currently taking Chantix.  Concerns today:   He mentions he had a recent orthopedic surgery on his right arm, hardware removal of elbow.  Review of Systems  Constitutional:  Negative for activity change, appetite change and fever.  HENT:  Negative for nosebleeds and sore throat.   Respiratory:  Negative for cough and wheezing.   Gastrointestinal:  Negative for abdominal pain, nausea and vomiting.  Endocrine: Negative for cold intolerance and heat intolerance.  Genitourinary:  Negative for decreased urine volume, dysuria and hematuria.  Skin:  Negative for rash.  Neurological:  Negative for syncope and facial asymmetry.  Psychiatric/Behavioral:  Negative for confusion and hallucinations.   See other pertinent positives and negatives in HPI.  Current Outpatient Medications on File Prior to Visit  Medication Sig Dispense Refill   acetaminophen (TYLENOL) 500 MG tablet Take 1 tablet (500 mg total) by mouth every 8 (eight) hours for 10 days. 30 tablet 0   glipiZIDE (GLUCOTROL) 5 MG tablet Take by mouth 2 (two) times daily before a meal.     ibuprofen (ADVIL) 800 MG tablet Take 1 tablet (800 mg total) by mouth every 8 (eight) hours for 10 days. Please take with food, please alternate with acetaminophen 30 tablet 0   lisinopril (ZESTRIL) 10 MG tablet TAKE 1 TABLET BY MOUTH EVERY DAY 30 tablet 0   mycophenolate (MYFORTIC) 180  MG EC tablet Take 4 tablets by mouth 2 (two) times daily.     oxyCODONE (ROXICODONE) 5 MG immediate release tablet Take 1 tablet (5 mg total) by mouth every 4 (four) hours as needed for severe pain (pain score 7-10) or breakthrough pain. 20 tablet 0   sildenafil (VIAGRA) 100 MG tablet Take 0.5-1 tablets (50-100 mg total) by mouth daily as needed for erectile dysfunction. 15 tablet 6   sitaGLIPtin (JANUVIA) 50 MG tablet Take 100 mg by mouth daily.      tacrolimus (PROGRAF) 1 MG capsule Take 5 mg by mouth 2 (two) times daily. Pt take 5 mg in the morning and 4 mg at night     varenicline (CHANTIX) 0.5 MG tablet Take 1 tablet (0.5 mg total) by mouth 2 (two) times daily. 60 tablet 0   [DISCONTINUED] diltiazem (CARDIZEM) 30 MG tablet Take 30 mg by mouth 4 (four) times daily. Ask patient to verify dosage and frequency. Not indicated on medical history form dated 10/23/10.      [DISCONTINUED] insulin lispro (HUMALOG) 100 UNIT/ML injection Inject into the skin 3 (three) times daily before meals. Ask patient to verify name/dosage/. History form noted dosage of 34 units during day and 22 units at night.      No current facility-administered medications on file prior to visit.    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   Hypercholesterolemia    Hypertension    Metabolic acidosis    Recurrent peritonitis (HCC)    Type 1 diabetes with renal manifestations, controlled (HCC)    Vitamin D deficiency    No Known Allergies  Social History   Socioeconomic History   Marital status: Married    Spouse name: Mark Miranda   Number of children: 4   Years of education: 10   Highest education level: Not on file  Occupational History   Occupation: Education administrator  Tobacco Use   Smoking status: Every Day    Current packs/day: 1.00    Average packs/day: 1 pack/day for 22.0 years (22.0 ttl pk-yrs)    Types: Cigarettes   Smokeless tobacco: Never   Tobacco comments:    Down to 1/2 ppd with chantix  Vaping Use   Vaping status: Former   Substances: Nicotine  Substance and Sexual Activity   Alcohol use: Yes    Alcohol/week: 0.0 standard drinks of alcohol    Comment: socially    Drug use: No   Sexual activity: Yes  Other Topics Concern   Not on file  Social History Narrative   Paints houses   Wife works at a Retail banker at [employer]   4 children age 55 to 32   Regular exercise-no current exercise regimen   Caffeine Use-no   Social Drivers of Health   Financial Resource Strain: Not on file  Food Insecurity: Not on file  Transportation Needs: Not on file  Physical Activity: Not on file  Stress: Not on file  Social Connections: Not on file   Today's Vitals   11/13/23 0808 11/13/23 0854  BP: (!) 156/80 (!) 160/85  Pulse: 75   Resp: 12   SpO2: 97%   Weight: 163 lb 2 oz (74 kg)  Height: 5\' 8"  (1.727 m)    Body mass index is 24.8 kg/m.  Physical Exam Vitals and nursing note reviewed.  Constitutional:      General: He is not in acute distress.    Appearance: He is well-developed.  HENT:     Head: Normocephalic and atraumatic.     Mouth/Throat:     Mouth: Mucous membranes are moist.     Pharynx: Oropharynx is clear. Uvula midline.  Eyes:     Conjunctiva/sclera: Conjunctivae normal.  Cardiovascular:     Rate and Rhythm: Normal rate and regular rhythm.     Pulses:          Dorsalis pedis pulses are 2+ on the right side and 2+ on the left side.     Heart sounds: No murmur heard. Pulmonary:     Effort: Pulmonary effort is normal. No respiratory distress.     Breath sounds: Normal breath sounds.  Abdominal:     Palpations: Abdomen is soft. There is no hepatomegaly or mass.     Tenderness: There is no abdominal tenderness.  Lymphadenopathy:     Cervical: No cervical adenopathy.  Skin:    General: Skin is warm.     Findings: No erythema or rash.  Neurological:     Mental Status: He is alert and oriented to person, place, and time.     Cranial Nerves: No cranial nerve deficit.     Gait: Gait normal.  Psychiatric:        Mood and Affect: Mood and affect normal.   ASSESSMENT AND PLAN:  Mr. Dowland was seen today for chronic disease management.     Type 2 diabetes mellitus with chronic kidney disease, without long-term current use of insulin, unspecified CKD stage (HCC) Assessment & Plan: Type I s/p pancreatic transplant. Last hemoglobin A1c 8.3 on 10/21/2023. About 3 weeks ago glipizide 5 mg was added.  He is also on Januvia 50 mg daily. No changes today but will consider changing to [place]. Instructed to monitor glucose at home. Annual eye exam, periodic dental and foot care recommended.  Orders: -     Dexcom G7 Receiver; 1 Device by Does not apply route daily.  Dispense: 1 each; Refill: 1 -     Dexcom G7 Sensor; 1 Device by Does not apply route daily.  Dispense: 12 each; Refill: 1 -     Accu-Chek Aviva Plus; As directed.  Dispense: 1 kit; Refill: 0  Hypertension, unspecified type Assessment & Plan: BP is not adequately controlled. We discussed possible complications of poorly controlled hypertension. Continue lisinopril 10 mg daily. Amlodipine 5 mg added today, we discussed some side effects and the risk of interaction with tacrolimus, may need levels check earlier. I will send message to Dr Allena Katz. Continue low-salt diet. Instructed to monitor BP at home. He is overdue for eye exam. Follow-up in 6 weeks.  Orders: -     amLODIPine Besylate; Take 1 tablet (5 mg total) by mouth at bedtime.  Dispense: 90 tablet; Refill: 1 -     Lisinopril; Take 1 tablet (10 mg total) by mouth daily.  Dispense: 90 tablet; Refill: 2  Hyperlipidemia LDL goal <70 He is not on statin at this time. Last LDL 143 in 06/2022. He is not fasting today. Will plan on FLP next visit.  Lung cancer screening declined by patient Encouraged smoking cessation. Discussed guideline recommendations.  Return in about 6 weeks (around 12/25/2023).  Trula Ore, acting as a Neurosurgeon for [employer]  [name], MD., have documented all relevant documentation on the behalf of Mauriana Dann [name], MD, as directed by  Vadis Slabach [name], MD while in the presence of Niels Cranshaw [name],  MD.   I, Ziare Orrick [name], MD, have reviewed all documentation for this visit. The documentation on 11/10/23 for the exam, diagnosis, procedures, and orders are all accurate and complete.  Okey Zelek G. [name], MD  [facility]. [place] office.

## 2023-11-13 ENCOUNTER — Encounter: Payer: Self-pay | Admitting: Family Medicine

## 2023-11-13 ENCOUNTER — Ambulatory Visit: Admitting: Family Medicine

## 2023-11-13 VITALS — BP 160/85 | HR 75 | Resp 12 | Ht 68.0 in | Wt 163.1 lb

## 2023-11-13 DIAGNOSIS — Z5320 Procedure and treatment not carried out because of patient's decision for unspecified reasons: Secondary | ICD-10-CM | POA: Diagnosis not present

## 2023-11-13 DIAGNOSIS — Z7984 Long term (current) use of oral hypoglycemic drugs: Secondary | ICD-10-CM

## 2023-11-13 DIAGNOSIS — E785 Hyperlipidemia, unspecified: Secondary | ICD-10-CM | POA: Diagnosis not present

## 2023-11-13 DIAGNOSIS — E1122 Type 2 diabetes mellitus with diabetic chronic kidney disease: Secondary | ICD-10-CM

## 2023-11-13 DIAGNOSIS — I10 Essential (primary) hypertension: Secondary | ICD-10-CM | POA: Diagnosis not present

## 2023-11-13 MED ORDER — DEXCOM G7 RECEIVER DEVI: 1.0000 | Freq: Every day | 1 refills | Status: DC

## 2023-11-13 MED ORDER — LISINOPRIL 10 MG PO TABS: 10.0000 mg | ORAL_TABLET | Freq: Every day | ORAL | 2 refills | Status: DC

## 2023-11-13 MED ORDER — DEXCOM G7 SENSOR MISC: 1.0000 | Freq: Every day | 1 refills | Status: DC

## 2023-11-13 MED ORDER — AMLODIPINE BESYLATE 5 MG PO TABS: 5.0000 mg | ORAL_TABLET | Freq: Every day | ORAL | 1 refills | Status: DC

## 2023-11-13 MED ORDER — ACCU-CHEK AVIVA PLUS W/DEVICE KIT: PACK | 0 refills | Status: AC

## 2023-11-13 NOTE — Assessment & Plan Note (Signed)
 BP is not adequately controlled. We discussed possible complications of poorly controlled hypertension. Continue lisinopril 10 mg daily. Amlodipine 5 mg added today, we discussed son side effects and the risk of interaction with tacrolimus. Continue low-salt diet. Instructed to monitor BP at home. He is overdue for eye exam. Follow-up in 6 weeks.

## 2023-11-13 NOTE — Patient Instructions (Addendum)
 A few things to remember from today's visit:  Hypertension, unspecified type - Plan: amLODipine (NORVASC) 5 MG tablet, lisinopril (ZESTRIL) 10 MG tablet  Type 2 diabetes mellitus with chronic kidney disease, without long-term current use of insulin, unspecified CKD stage (HCC) - Plan: Continuous Glucose Receiver (DEXCOM G7 RECEIVER) DEVI, Continuous Glucose Sensor (DEXCOM G7 SENSOR) MISC, Blood Glucose Monitoring Suppl (ACCU-CHEK AVIVA PLUS) w/Device KIT  Amlodipine added today. No changes in Lisinopril. No changes in diabetes medications. Monitor blood pressure at home and blood sugars. Will plan for fasting labs next visit.  If you need refills for medications you take chronically, please call your pharmacy. Do not use My Chart to request refills or for acute issues that need immediate attention. If you send a my chart message, it may take a few days to be addressed, specially if I am not in the office.  Please be sure medication list is accurate. If a new problem present, please set up appointment sooner than planned today.

## 2023-11-13 NOTE — Assessment & Plan Note (Signed)
 Type I s/p pancreatic transplant. Last hemoglobin A1c 8.3 on 10/21/2023. About 3 weeks ago glipizide 5 mg was added.  He is also on Januvia 50 mg daily. No changes today. Instructed to monitor glucose at home. Annual eye exam, periodic dental and foot care recommended.

## 2023-11-14 ENCOUNTER — Telehealth: Payer: Self-pay

## 2023-11-14 ENCOUNTER — Other Ambulatory Visit: Payer: Self-pay | Admitting: Orthopaedic Surgery

## 2023-11-14 ENCOUNTER — Other Ambulatory Visit (HOSPITAL_COMMUNITY): Payer: Self-pay

## 2023-11-14 MED ORDER — OXYCODONE HCL 5 MG PO TABS
5.0000 mg | ORAL_TABLET | ORAL | 0 refills | Status: DC | PRN
Start: 2023-11-14 — End: 2024-05-30

## 2023-11-14 NOTE — Telephone Encounter (Signed)
 Pharmacy Patient Advocate Encounter   Received notification from Onbase that prior authorization for Dexcom G7 Sensor is required/requested.   Insurance verification completed.   The patient is insured through [employer]  .   Per test claim: PA required; PA submitted to above mentioned insurance via CoverMyMeds Key/confirmation #/EOC [id number] Status is pending

## 2023-11-15 NOTE — Telephone Encounter (Addendum)
 Pharmacy Patient Advocate Encounter  Received notification from [facility] that Prior Authorization for [facility] G7 SENSOR has been DENIED.  Full denial letter will be uploaded to the media tab. See denial reason below.   PA #/Case ID/Reference #: [id number]

## 2023-11-15 NOTE — Telephone Encounter (Signed)
 ERROR

## 2023-11-16 ENCOUNTER — Ambulatory Visit (HOSPITAL_BASED_OUTPATIENT_CLINIC_OR_DEPARTMENT_OTHER): Admitting: Orthopaedic Surgery

## 2023-11-16 DIAGNOSIS — S42491A Other displaced fracture of lower end of right humerus, initial encounter for closed fracture: Secondary | ICD-10-CM

## 2023-11-16 NOTE — Progress Notes (Signed)
 Post Operative Evaluation    Procedure/Date of Surgery: Right distal humerus removal of hardware with ulnar neurolysis 4/1  Interval History:   Presents today 2 weeks status post above procedure.  Overall he is doing very well.  He states that the hard stop to extension with pinching is now gone.  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   Hypercholesterolemia    Hypertension    Metabolic acidosis    Recurrent peritonitis (HCC)    Type 1 diabetes with renal manifestations, controlled (HCC)    Vitamin D deficiency    Past Surgical History:  Procedure Laterality Date   AV FISTULA PLACEMENT  01/10/2012   Procedure: ARTERIOVENOUS (AV) FISTULA CREATION;  Surgeon: Sherren Kerns, MD;  Location: [facility] OR;  Service: Vascular;  Laterality: Left;  Left Brachial Cephalic Arteriovenous Fistula   CAPD INSERTION  01/09/2012   Procedure: CONTINUOUS AMBULATORY PERITONEAL DIALYSIS  (CAPD) CATHETER INSERTION;  Surgeon: Ernestene Mention, MD;  Location: [facility] OR;  Service: General;  Laterality: N/A;  Exteriorization PD Cath   CAPSULOTOMY Right 11/07/2023   Procedure: CAPSULOTOMY, ELBOW;  Surgeon: Huel Cote, MD;  Location: [facility];  Service: Orthopedics;  Laterality: Right;  RIGHT ELBOW CAPSULAR RELEASE   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   received left kidney, 3 renal arteries, 1 renal vein, ureter and pancreas from 53 year old   HARDWARE REMOVAL Right 11/07/2023   Procedure: REMOVAL, HARDWARE;  Surgeon: Huel Cote, MD;  Location: [facility];  Service:  Orthopedics;  Laterality: Right;   INSERTION OF DIALYSIS CATHETER  01/13/2012   Procedure: INSERTION OF DIALYSIS CATHETER;  Surgeon: Larina Earthly, MD;  Location: [facility] OR;  Service: Vascular;  Laterality: Right;  Internal Jugular   LAPAROSCOPY  03/13/2012   Procedure: LAPAROSCOPY DIAGNOSTIC;  Surgeon: Ardeth Sportsman, MD;  Location: [facility] OR;  Service: General;  Laterality: N/A;   LIVER BIOPSY  04/02/2013   open   ORIF HUMERUS FRACTURE Right 03/07/2023   Procedure: OPEN REDUCTION INTERNAL FIXATION (ORIF) DISTAL HUMERUS FRACTURE;  Surgeon: Huel Cote, MD;  Location: [facility] ORS;  Service: Orthopedics;  Laterality: Right;   PERITONEAL CATHETER INSERTION     PERITONEAL CATHETER REMOVAL     SMALL INTESTINE SURGERY  04/02/2013   TRICEPS TENDON REPAIR  03/07/2023   Procedure: TRICEPS TENDON REPAIR;  Surgeon: Huel Cote, MD;  Location: [facility] ORS;  Service: Orthopedics;;   ULNAR NERVE TRANSPOSITION Right 03/07/2023   Procedure: ULNAR NERVE EXPLORATION;  Surgeon: Huel Cote, MD;  Location: [facility] ORS;  Service: Orthopedics;  Laterality: Right;   Social History   Socioeconomic History   Marital status: Married    Spouse name: Byrd Hesselbach   Number of children: 4   Years of education: 10   Highest education level: Not on file  Occupational History   Occupation: Education administrator  Tobacco Use   Smoking status: Every Day    Current packs/day: 1.00    Average packs/day: 1 pack/day  for 22.0 years (22.0 ttl pk-yrs)    Types: Cigarettes   Smokeless tobacco: Never   Tobacco comments:    Down to 1/2 ppd with chantix  Vaping Use   Vaping status: Former   Substances: Nicotine  Substance and Sexual Activity   Alcohol use: Yes    Alcohol/week: 0.0 standard drinks of alcohol    Comment: socially   Drug use: No   Sexual activity: Yes  Other Topics Concern   Not on file  Social History Narrative   Paints houses   Wife works at a Retail banker at [employer]   4 children age 30 to 24   Regular exercise-no current  exercise regimen   Caffeine Use-no   Social Drivers of Corporate investment banker Strain: Not on file  Food Insecurity: Not on file  Transportation Needs: Not on file  Physical Activity: Not on file  Stress: Not on file  Social Connections: Not on file   Family History  Problem Relation Age of Onset   Diabetes Mother    Hypertension Mother    Hyperlipidemia Mother    Cancer Father        glioblastoma   No Known Allergies Current Outpatient Medications  Medication Sig Dispense Refill   amLODipine (NORVASC) 5 MG tablet Take 1 tablet (5 mg total) by mouth at bedtime. 90 tablet 1   Blood Glucose Monitoring Suppl (ACCU-CHEK AVIVA PLUS) w/Device KIT As directed. 1 kit 0   Continuous Glucose Receiver (DEXCOM G7 RECEIVER) DEVI 1 Device by Does not apply route daily. 1 each 1   Continuous Glucose Sensor (DEXCOM G7 SENSOR) MISC 1 Device by Does not apply route daily. 12 each 1   glipiZIDE (GLUCOTROL) 5 MG tablet Take by mouth 2 (two) times daily before a meal.     ibuprofen (ADVIL) 800 MG tablet Take 1 tablet (800 mg total) by mouth every 8 (eight) hours for 10 days. Please take with food, please alternate with acetaminophen 30 tablet 0   lisinopril (ZESTRIL) 10 MG tablet Take 1 tablet (10 mg total) by mouth daily. 90 tablet 2   mycophenolate (MYFORTIC) 180 MG EC tablet Take 4 tablets by mouth 2 (two) times daily.     oxyCODONE (ROXICODONE) 5 MG immediate release tablet Take 1 tablet (5 mg total) by mouth every 4 (four) hours as needed for severe pain (pain score 7-10) or breakthrough pain. 20 tablet 0   sitaGLIPtin (JANUVIA) 50 MG tablet Take 100 mg by mouth daily.     tacrolimus (PROGRAF) 1 MG capsule Take 5 mg by mouth 2 (two) times daily. Pt take 5 mg in the morning and 4 mg at night     varenicline (CHANTIX) 0.5 MG tablet Take 1 tablet (0.5 mg total) by mouth 2 (two) times daily. 60 tablet 0   No current facility-administered medications for this visit.   No results found.  Review  of Systems:   A ROS was performed including pertinent positives and negatives as documented in the HPI.   Musculoskeletal Exam:    There were no vitals taken for this visit.  Right incision is well-appearing which is subsequently healed..  Range of motion is from 10 degrees to 140 degrees.  Sensation is intact to light touch throughout.  Fires ulnar interosseous nerve distribution as well as radial nerve with strong wrist extension and 2+ radial pulse    Imaging:     I personally reviewed and interpreted the radiographs.   Assessment:   Status  post right distal humeral elbow hardware removal.  At this time he is progressing with range of motion.  I would like him to work with physical therapy to progress his flexion and extension.  I will plan to see him back in 1 month for reassessment Plan :    - Return to clinic 1 month for reassessment   Huel Cote, MD Attending Physician, Orthopedic Surgery  This document was dictated using Dragon voice recognition software. A reasonable attempt at proof reading has been made to minimize errors.

## 2023-11-21 ENCOUNTER — Ambulatory Visit (HOSPITAL_BASED_OUTPATIENT_CLINIC_OR_DEPARTMENT_OTHER): Payer: Worker's Compensation | Attending: Orthopaedic Surgery | Admitting: Physical Therapy

## 2023-11-21 ENCOUNTER — Other Ambulatory Visit: Payer: Self-pay

## 2023-11-21 ENCOUNTER — Encounter (HOSPITAL_BASED_OUTPATIENT_CLINIC_OR_DEPARTMENT_OTHER): Payer: Self-pay | Admitting: Physical Therapy

## 2023-11-21 DIAGNOSIS — M25521 Pain in right elbow: Secondary | ICD-10-CM | POA: Diagnosis present

## 2023-11-21 DIAGNOSIS — M6281 Muscle weakness (generalized): Secondary | ICD-10-CM | POA: Insufficient documentation

## 2023-11-21 DIAGNOSIS — S42491A Other displaced fracture of lower end of right humerus, initial encounter for closed fracture: Secondary | ICD-10-CM | POA: Insufficient documentation

## 2023-11-21 DIAGNOSIS — R600 Localized edema: Secondary | ICD-10-CM | POA: Insufficient documentation

## 2023-11-21 DIAGNOSIS — M25621 Stiffness of right elbow, not elsewhere classified: Secondary | ICD-10-CM | POA: Insufficient documentation

## 2023-11-21 NOTE — Therapy (Unsigned)
 OUTPATIENT PHYSICAL THERAPY UPPER EXTREMITY EVALUATION   Patient Name: Mark Miranda MRN: [id number] DOB:[birth year], 53 y.o., male Today's Date: 11/22/2023  END OF SESSION:  PT End of Session - 11/21/23 1014     Visit Number 1    Number of Visits 12    Date for PT Re-Evaluation 01/03/24    Authorization Type Worker's Comp    PT Start Time 1030   late arrival   PT Stop Time 1100    PT Time Calculation (min) 30 min    Activity Tolerance Patient tolerated treatment well    Behavior During Therapy WFL for tasks assessed/performed             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   Hypercholesterolemia    Hypertension    Metabolic acidosis    Recurrent peritonitis (HCC)    Type 1 diabetes with renal manifestations, controlled (HCC)    Vitamin D deficiency    Past Surgical History:  Procedure Laterality Date   AV FISTULA PLACEMENT  01/10/2012   Procedure: ARTERIOVENOUS (AV) FISTULA CREATION;  Surgeon: Sherren Kerns, MD;  Location: [facility] OR;  Service: Vascular;  Laterality: Left;  Left Brachial Cephalic Arteriovenous Fistula   CAPD INSERTION  01/09/2012   Procedure: CONTINUOUS AMBULATORY PERITONEAL DIALYSIS  (CAPD) CATHETER INSERTION;  Surgeon: Ernestene Mention, MD;  Location: [facility] OR;  Service: General;  Laterality: N/A;  Exteriorization PD Cath   CAPSULOTOMY Right 11/07/2023   Procedure: CAPSULOTOMY, ELBOW;  Surgeon: Huel Cote, MD;  Location: [facility];  Service: Orthopedics;  Laterality: Right;  RIGHT ELBOW CAPSULAR RELEASE   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   received left kidney, 3 renal arteries, 1 renal vein, ureter and pancreas from  53 year old   HARDWARE REMOVAL Right 11/07/2023   Procedure: REMOVAL, HARDWARE;  Surgeon: Huel Cote, MD;  Location: [facility];  Service: Orthopedics;  Laterality: Right;   INSERTION OF DIALYSIS CATHETER  01/13/2012   Procedure: INSERTION OF DIALYSIS CATHETER;  Surgeon: Larina Earthly, MD;  Location: [facility] OR;  Service: Vascular;  Laterality: Right;  Internal Jugular   LAPAROSCOPY  03/13/2012   Procedure: LAPAROSCOPY DIAGNOSTIC;  Surgeon: Ardeth Sportsman, MD;  Location: [facility] OR;  Service: General;  Laterality: N/A;   LIVER BIOPSY  04/02/2013   open   ORIF HUMERUS FRACTURE Right 03/07/2023   Procedure: OPEN REDUCTION INTERNAL FIXATION (ORIF) DISTAL HUMERUS FRACTURE;  Surgeon: Huel Cote, MD;  Location: [facility];  Service: Orthopedics;  Laterality: Right;   PERITONEAL CATHETER INSERTION     PERITONEAL CATHETER REMOVAL     SMALL INTESTINE SURGERY  04/02/2013   TRICEPS TENDON REPAIR  03/07/2023   Procedure: TRICEPS TENDON REPAIR;  Surgeon: Huel Cote, MD;  Location: [facility];  Service: Orthopedics;;   ULNAR NERVE TRANSPOSITION Right 03/07/2023   Procedure: ULNAR NERVE EXPLORATION;  Surgeon: Huel Cote, MD;  Location: [facility];  Service: Orthopedics;  Laterality: Right;   Patient Active Problem List   Diagnosis Date Noted   Hyperlipidemia LDL goal <70 11/13/2023   Closed fracture of right distal humerus 03/07/2023   Closed bicondylar fracture of distal end of right humerus 03/04/2023  Type 2 diabetes mellitus with diabetic chronic kidney disease (HCC) 07/22/2022   Depression, major, 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/2022   Erectile dysfunction 07/24/2017   Vitamin D deficiency, unspecified 07/24/2017   Low back pain 02/25/2016   Chronic fatigue 01/25/2016   Diabetic peripheral neuropathy (HCC) 11/24/2015   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   Depression 11/02/2015   Tobacco abuse 11/02/2015   End stage renal disease (HCC)  12/29/2011   HTN (hypertension) 01/25/2011    PCP: [name], [name], MD  REFERRING PROVIDER: Huel Cote, MD  REFERRING DIAG: [phone] (ICD-10-CM) - Other closed displaced fracture of distal end of right humerus, initial encounter  THERAPY DIAG:  Stiffness of right elbow, not elsewhere classified - Plan: PT plan of care cert/re-cert  Localized edema - Plan: PT plan of care cert/re-cert  Pain in right elbow - Plan: PT plan of care cert/re-cert  Muscle weakness (generalized) - Plan: PT plan of care cert/re-cert  Rationale for Evaluation and Treatment: Rehabilitation  ONSET DATE: 11/07/23 R distal humeral elbow hardware removal  SUBJECTIVE:   SUBJECTIVE STATEMENT: Pt is supposed to get stitches removed but will get them removed on Thursday. Pt states his elbow is feeling stiff and still needs a lot of motion. A lot of pain in his R shoulder (worse currently). Blames the block.   PERTINENT HISTORY: Per 11/16/23: Status post right distal humeral elbow hardware removal.  At this time he is progressing with range of motion.  I would like him to work with physical therapy to progress his flexion and extension.   PAIN:  Are you having pain? Yes: NPRS scale: 3/10 Pain location: postero/medial R shoulder Pain description: sharp Aggravating factors: Sleeping, leaning Relieving factors: massage  3/10 in the back of the elbow "More like a stretch"  PRECAUTIONS: None  RED FLAGS: None   WEIGHT BEARING RESTRICTIONS: No  FALLS:  Has patient fallen in last 6 months? 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  DIAGNOSTIC FINDINGS: R elbow x-ray 10/29/23 IMPRESSION: 1. Status post ORIF of the distal humerus without evidence of hardware complication. 2. Several calcific densities along the palmar aspect of the elbow joint may reflect loose bodies.  PATIENT SURVEYS:  TBD -- pt forgot his glasses, unable to read to do survey  COGNITION: Overall cognitive status: Within functional limits for tasks assessed     SENSATION: WFL  EDEMA:  Mild edema  MUSCLE LENGTH: Biceps: ROM below  POSTURE: rounded shoulders  PALPATION: TTP post elbow (tricep insertion) Firm end feel with ext and flexion Empty end feel shoulder flexion and abd (limited due to pt guarding/pain)  UPPER EXTREMITY ROM:  Active ROM (measured in supine) Right eval Left eval  Shoulder flexion 145*   Shoulder extension    Shoulder abduction 85*   Shoulder adduction    Shoulder extension    Shoulder internal rotation 80   Shoulder external rotation 75   Elbow flexion 115 145  Elbow extension -20 3  Wrist flexion    Wrist extension    Wrist ulnar deviation  Wrist radial deviation    Wrist pronation    Wrist supination     (Blank rows = not tested, * = pain)  UPPER EXTREMITY MMT:  MMT Right eval Left eval  Shoulder flexion 4   Shoulder extension    Shoulder abduction 4-   Shoulder adduction    Shoulder extension    Shoulder internal rotation 4-   Shoulder external rotation 3+   Middle trapezius    Lower trapezius    Elbow flexion (90 deg) 24.7, 23.6 38.3, 40.7  Elbow extension (90 deg) 12.9, 9.2 21.9, 28.3  Wrist flexion    Wrist extension    Wrist ulnar deviation    Wrist radial deviation    Wrist pronation    Wrist supination    Grip strength 35 lbs 55 lbs   (Blank rows = not tested)   FUNCTIONAL TESTS:  Did not assess  GAIT: Distance walked: Into clinic Assistive device utilized: None Level of assistance: Complete Independence Comments: Normal  reciprocal pattern                                                                                                                                TREATMENT DATE: 11/21/23 See HEP below    PATIENT EDUCATION:  Education details: Exam findings, POC, initial HEP Person educated: Patient Education method: Explanation, Demonstration, and Handouts Education comprehension: verbalized understanding, returned demonstration, and needs further education  HOME EXERCISE PROGRAM: Access Code: [email or web address] URL: [email or web address] Date: 11/22/2023 Prepared by: Vernon Prey April Kirstie Peri  Exercises - Supine Elbow Extension Stretch in Supination with tricep setting  - 1 x daily - 7 x weekly - 1 sets - 10 reps - Supine Elbow Extension Stretch in Pronation with tricep setting - 1 x daily - 7 x weekly - 1 sets - 10 reps - Supine Elbow Flexion Extension AROM  - 1 x daily - 7 x weekly - 1 sets - 10 reps  ASSESSMENT:  CLINICAL IMPRESSION: Patient is a 53 y.o. M who was seen today for physical therapy evaluation and treatment for R elbow stiffness and pain s/p R distal humeral elbow hardware removal. Assessment significant for gross R UE weakness, decreased ROM, increased edema and pain affecting pt's ability to perform lifting and UE tasks. Pt appears to be getting s/s of impingement in R UE with shoulder elevation and flexion which is also affecting his ability to reach and lift overhead. Pt will benefit from PT to address these issues for return to work.   OBJECTIVE IMPAIRMENTS: decreased coordination, decreased mobility, decreased ROM, decreased strength, hypomobility, increased edema, increased fascial restrictions, increased muscle spasms, impaired flexibility, impaired UE functional use, improper body mechanics, postural dysfunction, and pain.   ACTIVITY LIMITATIONS: carrying, lifting, bathing, toileting, dressing, reach over head, and hygiene/grooming  PARTICIPATION LIMITATIONS: meal prep,  cleaning, laundry, driving, shopping, community activity, and occupation  PERSONAL FACTORS: Age, Fitness, Past/current experiences, and Time since onset of injury/illness/exacerbation are  also affecting patient's functional outcome.   REHAB POTENTIAL: Good  CLINICAL DECISION MAKING: Evolving/moderate complexity  EVALUATION COMPLEXITY: Moderate   GOALS: Goals reviewed with patient? Yes  SHORT TERM GOALS: Target date: 12/13/2023  Pt will be ind with initial HEP Baseline: Goal status: INITIAL  2.  Pt will have improved elbow ROM to 10-135 deg Baseline:  Goal status: INITIAL  3.  Pt will have improved shoulder flexion and abd by at least 10 deg Baseline:  Goal status: INITIAL   LONG TERM GOALS: Target date: 01/03/2024   Pt will be ind with management and progression of HEP Baseline:  Goal status: INITIAL  2.  Pt will have improved elbow ROM to at least 5-140 deg Baseline:  Goal status: INITIAL  3.  Pt will have full and pain free shoulder elevation for overhead tasks Baseline:  Goal status: INITIAL  4.  Pt will be able to tolerate lifting and carrying at least 50# for work tasks Baseline:  Goal status: INITIAL  5.  Pt will have R UE strength to at least 90% of L UE strength for work tasks Baseline:  Goal status: INITIAL  6.  Pt will demo MCID on his QuickDASH or UEFI Baseline:  Goal status: INITIAL   PLAN:  PT FREQUENCY: 2x/week  PT DURATION: 6 weeks  PLANNED INTERVENTIONS: 97164- PT Re-evaluation, 97110-Therapeutic exercises, 97530- Therapeutic activity, [id number]- Neuromuscular re-education, 97535- Self Care, 16109- Manual therapy, G0283- Electrical stimulation (unattended), [id number]- Ionotophoresis 4mg /ml Dexamethasone, Patient/Family education, Taping, Dry Needling, Joint mobilization, Cryotherapy, and Moist heat  PLAN FOR NEXT SESSION: Perform a patient survey for UE. Continue elbow/shoulder ROM/strengthening, manual/joint mobs.    Vashon Riordan April Ma L Annalisia Ingber,  PT 11/22/2023, 8:03 AM

## 2023-11-23 ENCOUNTER — Encounter (HOSPITAL_BASED_OUTPATIENT_CLINIC_OR_DEPARTMENT_OTHER): Payer: Self-pay | Admitting: Student

## 2023-11-23 ENCOUNTER — Ambulatory Visit (HOSPITAL_BASED_OUTPATIENT_CLINIC_OR_DEPARTMENT_OTHER): Payer: Worker's Compensation | Admitting: Student

## 2023-11-23 DIAGNOSIS — S42491A Other displaced fracture of lower end of right humerus, initial encounter for closed fracture: Secondary | ICD-10-CM

## 2023-11-23 NOTE — Progress Notes (Signed)
 Patient is 3 weeks s/p Right distal humerus removal of hardware with ulnar neurolysis 4/1, He returns today for a suture removal. Incision is well healed and sutures were harvested and steri strips were applied. He will return to the office as already scheduled unless he is having any problems.

## 2023-12-01 ENCOUNTER — Ambulatory Visit (HOSPITAL_BASED_OUTPATIENT_CLINIC_OR_DEPARTMENT_OTHER): Payer: Worker's Compensation | Admitting: Physical Therapy

## 2023-12-01 DIAGNOSIS — R600 Localized edema: Secondary | ICD-10-CM

## 2023-12-01 DIAGNOSIS — M25621 Stiffness of right elbow, not elsewhere classified: Secondary | ICD-10-CM | POA: Diagnosis not present

## 2023-12-01 DIAGNOSIS — M6281 Muscle weakness (generalized): Secondary | ICD-10-CM

## 2023-12-01 DIAGNOSIS — M25521 Pain in right elbow: Secondary | ICD-10-CM

## 2023-12-01 NOTE — Therapy (Signed)
 OUTPATIENT PHYSICAL THERAPY TREATMENT   Patient Name: Mark Miranda MRN: [id number] DOB:September 05, 1970, 53 y.o., male Today's Date: 12/01/2023  END OF SESSION:  PT End of Session - 12/01/23 1144     Visit Number 2    Number of Visits 12    Date for PT Re-Evaluation 01/03/24    Authorization Type Worker's Comp    PT Start Time 1145    PT Stop Time 1225    PT Time Calculation (min) 40 min    Activity Tolerance Patient tolerated treatment well    Behavior During Therapy WFL for tasks assessed/performed              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   Hypercholesterolemia    Hypertension    Metabolic acidosis    Recurrent peritonitis (HCC)    Type 1 diabetes with renal manifestations, controlled (HCC)    Vitamin D  deficiency    Past Surgical History:  Procedure Laterality Date   AV FISTULA PLACEMENT  01/10/2012   Procedure: ARTERIOVENOUS (AV) FISTULA CREATION;  Surgeon: Richrd Char, MD;  Location: [facility] OR;  Service: Vascular;  Laterality: Left;  Left Brachial Cephalic Arteriovenous Fistula   CAPD INSERTION  01/09/2012   Procedure: CONTINUOUS AMBULATORY PERITONEAL DIALYSIS  (CAPD) CATHETER INSERTION;  Surgeon: Levert Ready, MD;  Location: [facility] OR;  Service: General;  Laterality: N/A;  Exteriorization PD Cath   CAPSULOTOMY Right 11/07/2023   Procedure: CAPSULOTOMY, ELBOW;  Surgeon: Wilhelmenia Harada, MD;  Location:  SURGERY CENTER;  Service: Orthopedics;  Laterality: Right;  RIGHT ELBOW CAPSULAR RELEASE   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   received left kidney, 3 renal arteries, 1 renal vein, ureter and pancreas from 53 year old   HARDWARE  REMOVAL Right 11/07/2023   Procedure: REMOVAL, HARDWARE;  Surgeon: Wilhelmenia Harada, MD;  Location:  SURGERY CENTER;  Service: Orthopedics;  Laterality: Right;   INSERTION OF DIALYSIS CATHETER  01/13/2012   Procedure: INSERTION OF DIALYSIS CATHETER;  Surgeon: Mayo Speck, MD;  Location: [facility] OR;  Service: Vascular;  Laterality: Right;  Internal Jugular   LAPAROSCOPY  03/13/2012   Procedure: LAPAROSCOPY DIAGNOSTIC;  Surgeon: Eddye Goodie, MD;  Location: [facility] OR;  Service: General;  Laterality: N/A;   LIVER BIOPSY  04/02/2013   open   ORIF HUMERUS FRACTURE Right 03/07/2023   Procedure: OPEN REDUCTION INTERNAL FIXATION (ORIF) DISTAL HUMERUS FRACTURE;  Surgeon: Wilhelmenia Harada, MD;  Location: [facility] ORS;  Service: Orthopedics;  Laterality: Right;   PERITONEAL CATHETER INSERTION     PERITONEAL CATHETER REMOVAL     SMALL INTESTINE SURGERY  04/02/2013   TRICEPS TENDON REPAIR  03/07/2023   Procedure: TRICEPS TENDON REPAIR;  Surgeon: Wilhelmenia Harada, MD;  Location: [facility] ORS;  Service: Orthopedics;;   ULNAR NERVE TRANSPOSITION Right 03/07/2023   Procedure: ULNAR NERVE EXPLORATION;  Surgeon: Wilhelmenia Harada, MD;  Location: [facility] ORS;  Service: Orthopedics;  Laterality: Right;   Patient Active Problem List   Diagnosis Date Noted   Hyperlipidemia LDL goal <70 11/13/2023   Closed fracture of right distal humerus 03/07/2023   Closed bicondylar fracture of distal end of right humerus 03/04/2023   Type 2 diabetes  mellitus with diabetic chronic kidney disease (HCC) 07/22/2022   Depression, major, 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/2022   Erectile dysfunction 07/24/2017   Vitamin D  deficiency, unspecified 07/24/2017   Low back pain 02/25/2016   Chronic fatigue 01/25/2016   Diabetic peripheral neuropathy (HCC) 11/24/2015   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  Rationale for Evaluation and Treatment: Rehabilitation  ONSET DATE: 11/07/23 R distal humeral elbow hardware removal  SUBJECTIVE:   SUBJECTIVE STATEMENT: Pt states he is feeling more sore. Got sutures removed. States his shoulder mostly hurts at night when he lays on it.   PERTINENT HISTORY: Per 11/16/23: Status post right distal humeral elbow hardware removal.  At this time he is progressing with range of motion.  I would like him to work with physical therapy to progress his flexion and extension.   PAIN:  Are you having pain? Yes: NPRS scale: 3/10 Pain location: postero/medial R shoulder Pain description: sharp Aggravating factors: Sleeping, leaning Relieving factors: massage  3/10 in the back of the elbow "More like a stretch"  PRECAUTIONS: None  RED FLAGS: None   WEIGHT BEARING RESTRICTIONS: No  FALLS:  Has patient fallen in last 6 months? 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  DIAGNOSTIC FINDINGS: R elbow x-ray 10/29/23 IMPRESSION: 1. Status post ORIF of the distal humerus without evidence of hardware complication. 2. Several calcific densities along the palmar aspect of the elbow joint may reflect loose bodies.  PATIENT SURVEYS:  TBD -- pt forgot his glasses, unable to read to do  survey  COGNITION: Overall cognitive status: Within functional limits for tasks assessed     SENSATION: WFL  EDEMA:  Mild edema  MUSCLE LENGTH: Biceps: ROM below  POSTURE: rounded shoulders  PALPATION: TTP post elbow (tricep insertion) Firm end feel with ext and flexion Empty end feel shoulder flexion and abd (limited due to pt guarding/pain)  UPPER EXTREMITY ROM:  Active ROM (measured in supine) Right eval Left eval  Shoulder flexion 145*   Shoulder extension    Shoulder abduction 85*   Shoulder adduction    Shoulder extension    Shoulder internal rotation 80   Shoulder external rotation 75   Elbow flexion 115 145  Elbow extension -20 3  Wrist flexion    Wrist extension    Wrist ulnar deviation    Wrist radial deviation    Wrist pronation    Wrist supination     (Blank rows = not tested, * = pain)  UPPER EXTREMITY MMT:  MMT Right eval Left eval  Shoulder flexion 4   Shoulder extension    Shoulder abduction 4-   Shoulder adduction    Shoulder extension    Shoulder internal rotation  4-   Shoulder external rotation 3+   Middle trapezius    Lower trapezius    Elbow flexion (90 deg) 24.7, 23.6 38.3, 40.7  Elbow extension (90 deg) 12.9, 9.2 21.9, 28.3  Wrist flexion    Wrist extension    Wrist ulnar deviation    Wrist radial deviation    Wrist pronation    Wrist supination    Grip strength 35 lbs 55 lbs   (Blank rows = not tested)   FUNCTIONAL TESTS:  Did not assess  GAIT: Distance walked: Into clinic Assistive device utilized: None Level of assistance: Complete Independence Comments: Normal reciprocal pattern                                                                                                                                TREATMENT DATE:  12/01/23 Pulleys x1 min flexion, x 1 min abd Supine tricep setting at end range x10 Grade II to III mobs for elbow ext and flexion Grade II to III mobs for elbow distraction Supine hammer  curl 1# x10 Supine forearm pron/sup 1# x10 Supine wrist flex/ext 1# x10 in sup and then pronation elbow extended Supine shoulder flexion 2# WaTE bar 2x10 Supine chest press 2# WaTE bar 2x10 Supine skull crusher no weight 2x10 with MWM for elbow ext Supine bicep setting into towel 2x10 Supine elbow PROM ext/flex full range x10 Supine shoulder ER 1# 2x10 Standing row red TB 2x10 Standing shoulder ext red TB 2x10   11/21/23 See HEP below    PATIENT EDUCATION:  Education details: Exam findings, POC, initial HEP Person educated: Patient Education method: Explanation, Demonstration, and Handouts Education comprehension: verbalized understanding, returned demonstration, and needs further education  HOME EXERCISE PROGRAM: Access Code: [email or web address] URL: [email or web address] Date: 11/22/2023 Prepared by: Easter Kennebrew April Erman Hayward  Exercises - Supine Elbow Extension Stretch in Supination with tricep setting  - 1 x daily - 7 x weekly - 1 sets - 10 reps - Supine Elbow Extension Stretch in Pronation with tricep setting - 1 x daily - 7 x weekly - 1 sets - 10 reps - Supine Elbow Flexion Extension AROM  - 1 x daily - 7 x weekly - 1 sets - 10 reps  ASSESSMENT:  CLINICAL IMPRESSION: Initiated gentle elbow joint mobilization for flexion and ext. Working on gross elbow stabilization and strength. Cueing required to keep from shoulder compensation when performing elbow/forearm movements. Tends to protract R shoulder to compensate for decreased R elbow extension.   From eval: Patient is a 53 y.o. M who was seen today for physical therapy evaluation and treatment for R elbow stiffness and pain s/p R distal humeral elbow hardware removal. Assessment significant for gross R UE weakness, decreased ROM, increased edema and pain affecting pt's ability to perform lifting and UE tasks. Pt appears to be getting s/s of impingement in R UE with shoulder elevation and flexion which is also affecting  his ability to  reach and lift overhead. Pt will benefit from PT to address these issues for return to work.   OBJECTIVE IMPAIRMENTS: decreased coordination, decreased mobility, decreased ROM, decreased strength, hypomobility, increased edema, increased fascial restrictions, increased muscle spasms, impaired flexibility, impaired UE functional use, improper body mechanics, postural dysfunction, and pain.   ACTIVITY LIMITATIONS: carrying, lifting, bathing, toileting, dressing, reach over head, and hygiene/grooming  PARTICIPATION LIMITATIONS: meal prep, cleaning, laundry, driving, shopping, community activity, and occupation  PERSONAL FACTORS: Age, Fitness, Past/current experiences, and Time since onset of injury/illness/exacerbation are also affecting patient's functional outcome.   REHAB POTENTIAL: Good  CLINICAL DECISION MAKING: Evolving/moderate complexity  EVALUATION COMPLEXITY: Moderate   GOALS: Goals reviewed with patient? Yes  SHORT TERM GOALS: Target date: 12/13/2023  Pt will be ind with initial HEP Baseline: Goal status: INITIAL  2.  Pt will have improved elbow ROM to 10-135 deg Baseline:  Goal status: INITIAL  3.  Pt will have improved shoulder flexion and abd by at least 10 deg Baseline:  Goal status: INITIAL   LONG TERM GOALS: Target date: 01/03/2024   Pt will be ind with management and progression of HEP Baseline:  Goal status: INITIAL  2.  Pt will have improved elbow ROM to at least 5-140 deg Baseline:  Goal status: INITIAL  3.  Pt will have full and pain free shoulder elevation for overhead tasks Baseline:  Goal status: INITIAL  4.  Pt will be able to tolerate lifting and carrying at least 50# for work tasks Baseline:  Goal status: INITIAL  5.  Pt will have R UE strength to at least 90% of L UE strength for work tasks Baseline:  Goal status: INITIAL  6.  Pt will demo MCID on his Quick[employer] or UEFI Baseline:  Goal status: INITIAL   PLAN:  PT  FREQUENCY: 2x/week  PT DURATION: 6 weeks  PLANNED INTERVENTIONS: 97164- PT Re-evaluation, 97110-Therapeutic exercises, 97530- Therapeutic activity, 97112- Neuromuscular re-education, 97535- Self Care, 16109- Manual therapy, [id number]- Electrical stimulation (unattended), [phone]- Ionotophoresis 4mg /ml Dexamethasone , Patient/Family education, Taping, Dry Needling, Joint mobilization, Cryotherapy, and Moist heat  PLAN FOR NEXT SESSION: Perform a patient survey for UE. Continue elbow/shoulder ROM/strengthening, manual/joint mobs.    Chelse Matas April Ma L Myrle Wanek, PT 12/01/2023, 11:44 AM

## 2023-12-13 ENCOUNTER — Encounter (HOSPITAL_BASED_OUTPATIENT_CLINIC_OR_DEPARTMENT_OTHER): Payer: Self-pay | Admitting: Physical Therapy

## 2023-12-13 ENCOUNTER — Telehealth (HOSPITAL_BASED_OUTPATIENT_CLINIC_OR_DEPARTMENT_OTHER): Payer: Self-pay

## 2023-12-13 ENCOUNTER — Ambulatory Visit (HOSPITAL_BASED_OUTPATIENT_CLINIC_OR_DEPARTMENT_OTHER): Payer: Worker's Compensation | Attending: Orthopaedic Surgery

## 2023-12-13 ENCOUNTER — Ambulatory Visit (HOSPITAL_BASED_OUTPATIENT_CLINIC_OR_DEPARTMENT_OTHER): Payer: Worker's Compensation | Admitting: Physical Therapy

## 2023-12-13 DIAGNOSIS — M25521 Pain in right elbow: Secondary | ICD-10-CM | POA: Diagnosis present

## 2023-12-13 DIAGNOSIS — M6281 Muscle weakness (generalized): Secondary | ICD-10-CM | POA: Insufficient documentation

## 2023-12-13 DIAGNOSIS — R600 Localized edema: Secondary | ICD-10-CM | POA: Insufficient documentation

## 2023-12-13 DIAGNOSIS — M25621 Stiffness of right elbow, not elsewhere classified: Secondary | ICD-10-CM | POA: Diagnosis present

## 2023-12-13 NOTE — Telephone Encounter (Signed)
 Called pt regarding missed appt. States he forgot his appt time. Able to reschedule for later today.

## 2023-12-13 NOTE — Therapy (Signed)
 OUTPATIENT PHYSICAL THERAPY TREATMENT   Patient Name: Mark Miranda MRN: [id number] DOB:[birth year], 53 y.o., male Today's Date: 12/13/2023  END OF SESSION:  PT End of Session - 12/13/23 1349     Visit Number 3    Number of Visits 12    Date for PT Re-Evaluation 01/03/24    Authorization Type Worker's Comp    PT Start Time 1349    PT Stop Time 1429    PT Time Calculation (min) 40 min    Activity Tolerance Patient tolerated treatment well    Behavior During Therapy [facility] for tasks assessed/performed              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   Hypercholesterolemia    Hypertension    Metabolic acidosis    Recurrent peritonitis (HCC)    Type 1 diabetes with renal manifestations, controlled (HCC)    Vitamin D  deficiency    Past Surgical History:  Procedure Laterality Date   AV FISTULA PLACEMENT  01/10/2012   Procedure: ARTERIOVENOUS (AV) FISTULA CREATION;  Surgeon: Richrd Char, MD;  Location: [facility] OR;  Service: Vascular;  Laterality: Left;  Left Brachial Cephalic Arteriovenous Fistula   CAPD INSERTION  01/09/2012   Procedure: CONTINUOUS AMBULATORY PERITONEAL DIALYSIS  (CAPD) CATHETER INSERTION;  Surgeon: Levert Ready, MD;  Location: [facility] OR;  Service: General;  Laterality: N/A;  Exteriorization PD Cath   CAPSULOTOMY Right 11/07/2023   Procedure: CAPSULOTOMY, ELBOW;  Surgeon: Wilhelmenia Harada, MD;  Location: [facility];  Service: Orthopedics;  Laterality: Right;  RIGHT ELBOW CAPSULAR RELEASE   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   received left kidney, 3 renal arteries, 1 renal vein, ureter and pancreas from 53 year old   HARDWARE REMOVAL  Right 11/07/2023   Procedure: REMOVAL, HARDWARE;  Surgeon: Wilhelmenia Harada, MD;  Location: [facility];  Service: Orthopedics;  Laterality: Right;   INSERTION OF DIALYSIS CATHETER  01/13/2012   Procedure: INSERTION OF DIALYSIS CATHETER;  Surgeon: Mayo Speck, MD;  Location: [facility] OR;  Service: Vascular;  Laterality: Right;  Internal Jugular   LAPAROSCOPY  03/13/2012   Procedure: LAPAROSCOPY DIAGNOSTIC;  Surgeon: Eddye Goodie, MD;  Location: [facility] OR;  Service: General;  Laterality: N/A;   LIVER BIOPSY  04/02/2013   open   ORIF HUMERUS FRACTURE Right 03/07/2023   Procedure: OPEN REDUCTION INTERNAL FIXATION (ORIF) DISTAL HUMERUS FRACTURE;  Surgeon: Wilhelmenia Harada, MD;  Location: [facility] ORS;  Service: Orthopedics;  Laterality: Right;   PERITONEAL CATHETER INSERTION     PERITONEAL CATHETER REMOVAL     SMALL INTESTINE SURGERY  04/02/2013   TRICEPS TENDON REPAIR  03/07/2023   Procedure: TRICEPS TENDON REPAIR;  Surgeon: Wilhelmenia Harada, MD;  Location: [facility] ORS;  Service: Orthopedics;;   ULNAR NERVE TRANSPOSITION Right 03/07/2023   Procedure: ULNAR NERVE EXPLORATION;  Surgeon: Wilhelmenia Harada, MD;  Location: [facility] ORS;  Service: Orthopedics;  Laterality: Right;   Patient Active Problem List   Diagnosis Date Noted   Hyperlipidemia LDL goal <70 11/13/2023   Closed fracture of right distal humerus 03/07/2023   Closed bicondylar fracture of distal end of right humerus 03/04/2023   Type 2 diabetes  mellitus with diabetic chronic kidney disease (HCC) 07/22/2022   Depression, major, 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/2022   Erectile dysfunction 07/24/2017   Vitamin D  deficiency, unspecified 07/24/2017   Low back pain 02/25/2016   Chronic fatigue 01/25/2016   Diabetic peripheral neuropathy (HCC) 11/24/2015   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   Depression 11/02/2015   Tobacco abuse 11/02/2015   End stage renal disease (HCC) 12/29/2011   HTN (hypertension)  01/25/2011    PCP: [name], [name], MD  REFERRING PROVIDER: Wilhelmenia Harada, MD  REFERRING DIAG: [phone] (ICD-10-CM) - Other closed displaced fracture of distal end of right humerus, initial encounter  THERAPY DIAG:  Stiffness of right elbow, not elsewhere classified  Localized edema  Pain in right elbow  Muscle weakness (generalized)  Rationale for Evaluation and Treatment: Rehabilitation  ONSET DATE: 11/07/23 R distal humeral elbow hardware removal  SUBJECTIVE:   SUBJECTIVE STATEMENT: Pt states continued pain with end range flexion/extension of elbow. Shoulder pain more in the back of shoulder  PERTINENT HISTORY: Per 11/16/23: Status post right distal humeral elbow hardware removal.  At this time he is progressing with range of motion.  I would like him to work with physical therapy to progress his flexion and extension.   PAIN:  Are you having pain? Yes: NPRS scale: 3/10 Pain location: postero/medial R shoulder Pain description: sharp Aggravating factors: Sleeping, leaning Relieving factors: massage  3/10 in the back of the elbow "More like a stretch"  PRECAUTIONS: None  RED FLAGS: None   WEIGHT BEARING RESTRICTIONS: No  FALLS:  Has patient fallen in last 6 months? 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  DIAGNOSTIC FINDINGS: R elbow x-ray 10/29/23 IMPRESSION: 1. Status post ORIF of the distal humerus without evidence of hardware complication. 2. Several calcific densities along the palmar aspect of the elbow joint may reflect loose bodies.  PATIENT  SURVEYS:  TBD -- pt forgot his glasses, unable to read to do survey  COGNITION: Overall cognitive status: Within functional limits for tasks assessed     SENSATION: WFL  EDEMA:  Mild edema  MUSCLE LENGTH: Biceps: ROM below  POSTURE: rounded shoulders  PALPATION: TTP post elbow (tricep insertion) Firm end feel with ext and flexion Empty end feel shoulder flexion and abd (limited due to pt guarding/pain)  UPPER EXTREMITY ROM:  Active ROM (measured in supine) Right eval Left eval  Shoulder flexion 145*   Shoulder extension    Shoulder abduction 85*   Shoulder adduction    Shoulder extension    Shoulder internal rotation 80   Shoulder external rotation 75   Elbow flexion 115 145  Elbow extension -20 3  Wrist flexion    Wrist extension    Wrist ulnar deviation    Wrist radial deviation    Wrist pronation    Wrist supination     (Blank rows = not tested, * = pain)  UPPER EXTREMITY MMT:  MMT Right eval Left eval  Shoulder flexion 4   Shoulder extension    Shoulder abduction 4-   Shoulder adduction  Shoulder extension    Shoulder internal rotation 4-   Shoulder external rotation 3+   Middle trapezius    Lower trapezius    Elbow flexion (90 deg) 24.7, 23.6 38.3, 40.7  Elbow extension (90 deg) 12.9, 9.2 21.9, 28.3  Wrist flexion    Wrist extension    Wrist ulnar deviation    Wrist radial deviation    Wrist pronation    Wrist supination    Grip strength 35 lbs 55 lbs   (Blank rows = not tested)   FUNCTIONAL TESTS:  Did not assess  GAIT: Distance walked: Into clinic Assistive device utilized: None Level of assistance: Complete Independence Comments: Normal reciprocal pattern                                                                                                                                TREATMENT DATE:  12/13/23 Manual: Grade II to III mobs for elbow ext and flexion, Grade II to III mobs for elbow distraction, STM to tricep, Radial  head mobs grade II-III AP/PA glides Supine shoulder flexion RTB 2 x 10 Supine bilateral ER RTB 2 x 10 Supine horizontal abduction RTB 2 x 10  Supine skull crusher no weight 2x10 Sidelying ER 2# 3 x 10    12/01/23 Pulleys x1 min flexion, x 1 min abd Supine tricep setting at end range x10 Grade II to III mobs for elbow ext and flexion Grade II to III mobs for elbow distraction Supine hammer curl 1# x10 Supine forearm pron/sup 1# x10 Supine wrist flex/ext 1# x10 in sup and then pronation elbow extended Supine shoulder flexion 2# WaTE bar 2x10 Supine chest press 2# WaTE bar 2x10 Supine skull crusher no weight 2x10 with MWM for elbow ext Supine bicep setting into towel 2x10 Supine elbow PROM ext/flex full range x10 Supine shoulder ER 1# 2x10 Standing row red TB 2x10 Standing shoulder ext red TB 2x10   11/21/23 See HEP below    PATIENT EDUCATION:  Education details: Exam findings, POC, initial HEP Person educated: Patient Education method: Explanation, Demonstration, and Handouts Education comprehension: verbalized understanding, returned demonstration, and needs further education  HOME EXERCISE PROGRAM: Access Code: [email or web address] URL: [email or web address] Date: 11/22/2023 Prepared by: Gellen April Erman Hayward  Exercises - Supine Elbow Extension Stretch in Supination with tricep setting  - 1 x daily - 7 x weekly - 1 sets - 10 reps - Supine Elbow Extension Stretch in Pronation with tricep setting - 1 x daily - 7 x weekly - 1 sets - 10 reps - Supine Elbow Flexion Extension AROM  - 1 x daily - 7 x weekly - 1 sets - 10 reps  ASSESSMENT:  CLINICAL IMPRESSION: Continued with mobilizations to elbow with ROM from lacking 8 to 137. Continued with elbow and shoulder ROM and strengthening exercises which are tolerated well. Patient will continue to benefit from physical therapy in order to improve function and reduce impairment.  From eval: Patient is a 53 y.o. M who  was seen today for physical therapy evaluation and treatment for R elbow stiffness and pain s/p R distal humeral elbow hardware removal. Assessment significant for gross R UE weakness, decreased ROM, increased edema and pain affecting pt's ability to perform lifting and UE tasks. Pt appears to be getting s/s of impingement in R UE with shoulder elevation and flexion which is also affecting his ability to reach and lift overhead. Pt will benefit from PT to address these issues for return to work.   OBJECTIVE IMPAIRMENTS: decreased coordination, decreased mobility, decreased ROM, decreased strength, hypomobility, increased edema, increased fascial restrictions, increased muscle spasms, impaired flexibility, impaired UE functional use, improper body mechanics, postural dysfunction, and pain.   ACTIVITY LIMITATIONS: carrying, lifting, bathing, toileting, dressing, reach over head, and hygiene/grooming  PARTICIPATION LIMITATIONS: meal prep, cleaning, laundry, driving, shopping, community activity, and occupation  PERSONAL FACTORS: Age, Fitness, Past/current experiences, and Time since onset of injury/illness/exacerbation are also affecting patient's functional outcome.   REHAB POTENTIAL: Good  CLINICAL DECISION MAKING: Evolving/moderate complexity  EVALUATION COMPLEXITY: Moderate   GOALS: Goals reviewed with patient? Yes  SHORT TERM GOALS: Target date: 12/13/2023  Pt will be ind with initial HEP Baseline: Goal status: INITIAL  2.  Pt will have improved elbow ROM to 10-135 deg Baseline:  Goal status: INITIAL  3.  Pt will have improved shoulder flexion and abd by at least 10 deg Baseline:  Goal status: INITIAL   LONG TERM GOALS: Target date: 01/03/2024   Pt will be ind with management and progression of HEP Baseline:  Goal status: INITIAL  2.  Pt will have improved elbow ROM to at least 5-140 deg Baseline:  Goal status: INITIAL  3.  Pt will have full and pain free shoulder  elevation for overhead tasks Baseline:  Goal status: INITIAL  4.  Pt will be able to tolerate lifting and carrying at least 50# for work tasks Baseline:  Goal status: INITIAL  5.  Pt will have R UE strength to at least 90% of L UE strength for work tasks Baseline:  Goal status: INITIAL  6.  Pt will demo MCID on his QuickDASH or UEFI Baseline:  Goal status: INITIAL   PLAN:  PT FREQUENCY: 2x/week  PT DURATION: 6 weeks  PLANNED INTERVENTIONS: 97164- PT Re-evaluation, 97110-Therapeutic exercises, 97530- Therapeutic activity, [id number]- Neuromuscular re-education, 97535- Self Care, 16109- Manual therapy, G0283- Electrical stimulation (unattended), [phone]- Ionotophoresis 4mg /ml Dexamethasone , Patient/Family education, Taping, Dry Needling, Joint mobilization, Cryotherapy, and Moist heat  PLAN FOR NEXT SESSION: Perform a patient survey for UE. Continue elbow/shoulder ROM/strengthening, manual/joint mobs.    Perfecto Bracket Merve Hotard, PT 12/13/2023, 1:50 PM

## 2023-12-14 ENCOUNTER — Ambulatory Visit (INDEPENDENT_AMBULATORY_CARE_PROVIDER_SITE_OTHER): Payer: Worker's Compensation | Admitting: Orthopaedic Surgery

## 2023-12-14 DIAGNOSIS — S42491A Other displaced fracture of lower end of right humerus, initial encounter for closed fracture: Secondary | ICD-10-CM

## 2023-12-14 NOTE — Progress Notes (Signed)
 Post Operative Evaluation    Procedure/Date of Surgery: Right distal humerus removal of hardware with ulnar neurolysis 4/1  Interval History:   Presents today status post removal of hardware for the elbow.  He is continuing to work on extension which is going well.  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   Hypercholesterolemia    Hypertension    Metabolic acidosis    Recurrent peritonitis (HCC)    Type 1 diabetes with renal manifestations, controlled (HCC)    Vitamin D  deficiency    Past Surgical History:  Procedure Laterality Date   AV FISTULA PLACEMENT  01/10/2012   Procedure: ARTERIOVENOUS (AV) FISTULA CREATION;  Surgeon: Richrd Char, MD;  Location: [facility] OR;  Service: Vascular;  Laterality: Left;  Left Brachial Cephalic Arteriovenous Fistula   CAPD INSERTION  01/09/2012   Procedure: CONTINUOUS AMBULATORY PERITONEAL DIALYSIS  (CAPD) CATHETER INSERTION;  Surgeon: Levert Ready, MD;  Location: [facility] OR;  Service: General;  Laterality: N/A;  Exteriorization PD Cath   CAPSULOTOMY Right 11/07/2023   Procedure: CAPSULOTOMY, ELBOW;  Surgeon: Wilhelmenia Harada, MD;  Location: [facility];  Service: Orthopedics;  Laterality: Right;  RIGHT ELBOW CAPSULAR RELEASE   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   received left kidney, 3 renal arteries, 1 renal vein, ureter and pancreas from 53 year old   HARDWARE REMOVAL Right 11/07/2023   Procedure: REMOVAL, HARDWARE;  Surgeon: Wilhelmenia Harada, MD;   Location:  SURGERY CENTER;  Service: Orthopedics;  Laterality: Right;   INSERTION OF DIALYSIS CATHETER  01/13/2012   Procedure: INSERTION OF DIALYSIS CATHETER;  Surgeon: Mayo Speck, MD;  Location: [facility] OR;  Service: Vascular;  Laterality: Right;  Internal Jugular   LAPAROSCOPY  03/13/2012   Procedure: LAPAROSCOPY DIAGNOSTIC;  Surgeon: Eddye Goodie, MD;  Location: [facility] OR;  Service: General;  Laterality: N/A;   LIVER BIOPSY  04/02/2013   open   ORIF HUMERUS FRACTURE Right 03/07/2023   Procedure: OPEN REDUCTION INTERNAL FIXATION (ORIF) DISTAL HUMERUS FRACTURE;  Surgeon: Wilhelmenia Harada, MD;  Location: [facility] ORS;  Service: Orthopedics;  Laterality: Right;   PERITONEAL CATHETER INSERTION     PERITONEAL CATHETER REMOVAL     SMALL INTESTINE SURGERY  04/02/2013   TRICEPS TENDON REPAIR  03/07/2023   Procedure: TRICEPS TENDON REPAIR;  Surgeon: Wilhelmenia Harada, MD;  Location: [facility] ORS;  Service: Orthopedics;;   ULNAR NERVE TRANSPOSITION Right 03/07/2023   Procedure: ULNAR NERVE EXPLORATION;  Surgeon: Wilhelmenia Harada, MD;  Location: [facility] ORS;  Service: Orthopedics;  Laterality: Right;   Social History   Socioeconomic History   Marital status: Married    Spouse name: Shelagh Derrick   Number of children: 4   Years of education: 10   Highest education level: Not on file  Occupational History   Occupation: Education administrator  Tobacco Use   Smoking status: Every Day    Current packs/day: 1.00  Average packs/day: 1 pack/day for 22.0 years (22.0 ttl pk-yrs)    Types: Cigarettes   Smokeless tobacco: Never   Tobacco comments:    Down to 1/2 ppd with chantix   Vaping Use   Vaping status: Former   Substances: Nicotine   Substance and Sexual Activity   Alcohol  use: Yes    Alcohol /week: 0.0 standard drinks of alcohol     Comment: socially   Drug use: No   Sexual activity: Yes  Other Topics Concern   Not on file  Social History Narrative   Paints houses   Wife works at a Retail banker at [employer]   4 children age  45 to 12   Regular exercise-no current exercise regimen   Caffeine Use-no   Social Drivers of Corporate investment banker Strain: Not on file  Food Insecurity: Not on file  Transportation Needs: Not on file  Physical Activity: Not on file  Stress: Not on file  Social Connections: Not on file   Family History  Problem Relation Age of Onset   Diabetes Mother    Hypertension Mother    Hyperlipidemia Mother    Cancer Father        glioblastoma   No Known Allergies Current Outpatient Medications  Medication Sig Dispense Refill   amLODipine  (NORVASC ) 5 MG tablet Take 1 tablet (5 mg total) by mouth at bedtime. 90 tablet 1   Blood Glucose Monitoring Suppl (ACCU-CHEK AVIVA PLUS) w/Device KIT As directed. 1 kit 0   Continuous Glucose Receiver (DEXCOM G7 RECEIVER) DEVI 1 Device by Does not apply route daily. 1 each 1   Continuous Glucose Sensor (DEXCOM G7 SENSOR) MISC 1 Device by Does not apply route daily. 12 each 1   glipiZIDE (GLUCOTROL) 5 MG tablet Take by mouth 2 (two) times daily before a meal.     lisinopril  (ZESTRIL ) 10 MG tablet Take 1 tablet (10 mg total) by mouth daily. 90 tablet 2   mycophenolate (MYFORTIC) 180 MG EC tablet Take 4 tablets by mouth 2 (two) times daily.     oxyCODONE  (ROXICODONE ) 5 MG immediate release tablet Take 1 tablet (5 mg total) by mouth every 4 (four) hours as needed for severe pain (pain score 7-10) or breakthrough pain. 20 tablet 0   sitaGLIPtin (JANUVIA) 50 MG tablet Take 100 mg by mouth daily.     tacrolimus  (PROGRAF ) 1 MG capsule Take 5 mg by mouth 2 (two) times daily. Pt take 5 mg in the morning and 4 mg at night     varenicline  (CHANTIX ) 0.5 MG tablet Take 1 tablet (0.5 mg total) by mouth 2 (two) times daily. 60 tablet 0   No current facility-administered medications for this visit.   No results found.  Review of Systems:   A ROS was performed including pertinent positives and negatives as documented in the HPI.   Musculoskeletal Exam:     There were no vitals taken for this visit.  Right incision is well-appearing which is subsequently healed..  Range of motion is from 5 degrees to 140 degrees.  Sensation is intact to light touch throughout.  Fires ulnar interosseous nerve distribution as well as radial nerve with strong wrist extension and 2+ radial pulse    Imaging:     I personally reviewed and interpreted the radiographs.   Assessment:   Status post right distal humeral elbow hardware removal.  At this time she is making significant progress following the removal of his hardware.  Given this I do believe he  will be ready for a functional evaluation come mid June and I will plan to see him back at that time and we will discuss maximal medical improvement Plan :    - Return to clinic 6 weeks   Wilhelmenia Harada, MD Attending Physician, Orthopedic Surgery  This document was dictated using Dragon voice recognition software. A reasonable attempt at proof reading has been made to minimize errors.

## 2023-12-15 ENCOUNTER — Ambulatory Visit (HOSPITAL_BASED_OUTPATIENT_CLINIC_OR_DEPARTMENT_OTHER): Payer: Worker's Compensation

## 2023-12-15 ENCOUNTER — Telehealth (HOSPITAL_BASED_OUTPATIENT_CLINIC_OR_DEPARTMENT_OTHER): Payer: Self-pay

## 2023-12-15 NOTE — Telephone Encounter (Signed)
 Called and spoke with pt regarding missed appt. He forgot appt and plans to attend next week.

## 2023-12-18 ENCOUNTER — Telehealth: Payer: Self-pay | Admitting: Orthopaedic Surgery

## 2023-12-18 NOTE — Telephone Encounter (Signed)
 12/14/23 ov note & work note faxed to [employer] work [employer] [phone]

## 2023-12-19 ENCOUNTER — Encounter (HOSPITAL_BASED_OUTPATIENT_CLINIC_OR_DEPARTMENT_OTHER): Payer: Self-pay

## 2023-12-19 ENCOUNTER — Ambulatory Visit (HOSPITAL_BASED_OUTPATIENT_CLINIC_OR_DEPARTMENT_OTHER): Payer: Worker's Compensation

## 2023-12-19 DIAGNOSIS — M25621 Stiffness of right elbow, not elsewhere classified: Secondary | ICD-10-CM | POA: Diagnosis not present

## 2023-12-19 DIAGNOSIS — M25521 Pain in right elbow: Secondary | ICD-10-CM

## 2023-12-19 DIAGNOSIS — M6281 Muscle weakness (generalized): Secondary | ICD-10-CM

## 2023-12-19 DIAGNOSIS — R600 Localized edema: Secondary | ICD-10-CM

## 2023-12-19 NOTE — Therapy (Signed)
 OUTPATIENT PHYSICAL THERAPY TREATMENT   Patient Name: Mark Miranda MRN: [id number] DOB:02/09/1971, 53 y.o., male Today's Date: 12/19/2023  END OF SESSION:  PT End of Session - 12/19/23 1350     Visit Number 4    Number of Visits 12    Date for PT Re-Evaluation 01/03/24    Authorization Type Worker's Comp    PT Start Time 1301    PT Stop Time 1345    PT Time Calculation (min) 44 min    Activity Tolerance Patient tolerated treatment well    Behavior During Therapy WFL for tasks assessed/performed               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   Hypercholesterolemia    Hypertension    Metabolic acidosis    Recurrent peritonitis (HCC)    Type 1 diabetes with renal manifestations, controlled (HCC)    Vitamin D  deficiency    Past Surgical History:  Procedure Laterality Date   AV FISTULA PLACEMENT  01/10/2012   Procedure: ARTERIOVENOUS (AV) FISTULA CREATION;  Surgeon: Richrd Char, MD;  Location: [facility] OR;  Service: Vascular;  Laterality: Left;  Left Brachial Cephalic Arteriovenous Fistula   CAPD INSERTION  01/09/2012   Procedure: CONTINUOUS AMBULATORY PERITONEAL DIALYSIS  (CAPD) CATHETER INSERTION;  Surgeon: Levert Ready, MD;  Location: [facility] OR;  Service: General;  Laterality: N/A;  Exteriorization PD Cath   CAPSULOTOMY Right 11/07/2023   Procedure: CAPSULOTOMY, ELBOW;  Surgeon: Wilhelmenia Harada, MD;  Location: [facility];  Service: Orthopedics;  Laterality: Right;  RIGHT ELBOW CAPSULAR RELEASE   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   received left kidney, 3 renal arteries, 1 renal vein, ureter and pancreas from 53 year old   HARDWARE  REMOVAL Right 11/07/2023   Procedure: REMOVAL, HARDWARE;  Surgeon: Wilhelmenia Harada, MD;  Location: [facility];  Service: Orthopedics;  Laterality: Right;   INSERTION OF DIALYSIS CATHETER  01/13/2012   Procedure: INSERTION OF DIALYSIS CATHETER;  Surgeon: Mayo Speck, MD;  Location: [facility] OR;  Service: Vascular;  Laterality: Right;  Internal Jugular   LAPAROSCOPY  03/13/2012   Procedure: LAPAROSCOPY DIAGNOSTIC;  Surgeon: Eddye Goodie, MD;  Location: [facility] OR;  Service: General;  Laterality: N/A;   LIVER BIOPSY  04/02/2013   open   ORIF HUMERUS FRACTURE Right 03/07/2023   Procedure: OPEN REDUCTION INTERNAL FIXATION (ORIF) DISTAL HUMERUS FRACTURE;  Surgeon: Wilhelmenia Harada, MD;  Location: [facility] ORS;  Service: Orthopedics;  Laterality: Right;   PERITONEAL CATHETER INSERTION     PERITONEAL CATHETER REMOVAL     SMALL INTESTINE SURGERY  04/02/2013   TRICEPS TENDON REPAIR  03/07/2023   Procedure: TRICEPS TENDON REPAIR;  Surgeon: Wilhelmenia Harada, MD;  Location: [facility] ORS;  Service: Orthopedics;;   ULNAR NERVE TRANSPOSITION Right 03/07/2023   Procedure: ULNAR NERVE EXPLORATION;  Surgeon: Wilhelmenia Harada, MD;  Location: [facility] ORS;  Service: Orthopedics;  Laterality: Right;   Patient Active Problem List   Diagnosis Date Noted   Hyperlipidemia LDL goal <70 11/13/2023   Closed fracture of right distal humerus 03/07/2023   Closed bicondylar fracture of distal end of right humerus 03/04/2023   Type 2  diabetes mellitus with diabetic chronic kidney disease (HCC) 07/22/2022   Depression, major, 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/2022   Erectile dysfunction 07/24/2017   Vitamin D  deficiency, unspecified 07/24/2017   Low back pain 02/25/2016   Chronic fatigue 01/25/2016   Diabetic peripheral neuropathy (HCC) 11/24/2015   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   Depression 11/02/2015   Tobacco abuse 11/02/2015   End stage renal disease (HCC) 12/29/2011   HTN  (hypertension) 01/25/2011    PCP: [name], [name], MD  REFERRING PROVIDER: Wilhelmenia Harada, MD  REFERRING DIAG: [phone] (ICD-10-CM) - Other closed displaced fracture of distal end of right humerus, initial encounter  THERAPY DIAG:  Muscle weakness (generalized)  Pain in right elbow  Localized edema  Stiffness of right elbow, not elsewhere classified  Rationale for Evaluation and Treatment: Rehabilitation  ONSET DATE: 11/07/23 R distal humeral elbow hardware removal  SUBJECTIVE:   SUBJECTIVE STATEMENT: Pt states continued pain with end range flexion/extension of elbow. Shoulder pain more in the back of shoulder  PERTINENT HISTORY: Per 11/16/23: Status post right distal humeral elbow hardware removal.  At this time he is progressing with range of motion.  I would like him to work with physical therapy to progress his flexion and extension.   PAIN:  Are you having pain? Yes: NPRS scale: 3/10 Pain location: postero/medial R shoulder Pain description: sharp Aggravating factors: Sleeping, leaning Relieving factors: massage  3/10 in the back of the elbow "More like a stretch"  PRECAUTIONS: None  RED FLAGS: None   WEIGHT BEARING RESTRICTIONS: No  FALLS:  Has patient fallen in last 6 months? 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  DIAGNOSTIC FINDINGS: R elbow x-ray 10/29/23 IMPRESSION: 1. Status post ORIF of the distal humerus without evidence of hardware complication. 2. Several calcific densities along the palmar aspect of the elbow joint may reflect loose  bodies.  PATIENT SURVEYS:  TBD -- pt forgot his glasses, unable to read to do survey  COGNITION: Overall cognitive status: Within functional limits for tasks assessed     SENSATION: WFL  EDEMA:  Mild edema  MUSCLE LENGTH: Biceps: ROM below  POSTURE: rounded shoulders  PALPATION: TTP post elbow (tricep insertion) Firm end feel with ext and flexion Empty end feel shoulder flexion and abd (limited due to pt guarding/pain)  UPPER EXTREMITY ROM:  Active ROM (measured in supine) Right eval Left eval  Shoulder flexion 145*   Shoulder extension    Shoulder abduction 85*   Shoulder adduction    Shoulder extension    Shoulder internal rotation 80   Shoulder external rotation 75   Elbow flexion 115 145  Elbow extension -20 3  Wrist flexion    Wrist extension    Wrist ulnar deviation    Wrist radial deviation    Wrist pronation    Wrist supination     (Blank rows = not tested, * = pain)  UPPER EXTREMITY MMT:  MMT Right eval Left eval  Shoulder flexion 4   Shoulder extension    Shoulder abduction 4-   Shoulder  adduction    Shoulder extension    Shoulder internal rotation 4-   Shoulder external rotation 3+   Middle trapezius    Lower trapezius    Elbow flexion (90 deg) 24.7, 23.6 38.3, 40.7  Elbow extension (90 deg) 12.9, 9.2 21.9, 28.3  Wrist flexion    Wrist extension    Wrist ulnar deviation    Wrist radial deviation    Wrist pronation    Wrist supination    Grip strength 35 lbs 55 lbs   (Blank rows = not tested)   FUNCTIONAL TESTS:  Did not assess  GAIT: Distance walked: Into clinic Assistive device utilized: None Level of assistance: Complete Independence Comments: Normal reciprocal pattern                                                                                                                                TREATMENT DATE:    12/19/23 Manual: Grade II to III mobs for elbow ext and flexion, Grade II to III mobs for elbow distraction,  STM to tricep, Radial head mobs grade II-III AP/PA glides, STM to lats, R shoulder PROM Supine shoulder flexion RTB 2 x 10 Supine bilateral ER RTB 2 x 10 Supine bicep curl 1# 2x10 Supine skull crusher no weight 2x10 Prone triceps extension 0# x20 Prone row 6# 2x10 Flex bar (red) U/Ns and twists x30ea Standing bicep curl with eccentric lowering 2# x20 Hammer curl 2# 2x10 Press out 1# 2x10   12/13/23 Manual: Grade II to III mobs for elbow ext and flexion, Grade II to III mobs for elbow distraction, STM to tricep, Radial head mobs grade II-III AP/PA glides Supine shoulder flexion RTB 2 x 10 Supine bilateral ER RTB 2 x 10 Supine horizontal abduction RTB 2 x 10  Supine skull crusher no weight 2x10 Sidelying ER 2# 3 x 10    12/01/23 Pulleys x1 min flexion, x 1 min abd Supine tricep setting at end range x10 Grade II to III mobs for elbow ext and flexion Grade II to III mobs for elbow distraction Supine hammer curl 1# x10 Supine forearm pron/sup 1# x10 Supine wrist flex/ext 1# x10 in sup and then pronation elbow extended Supine shoulder flexion 2# WaTE bar 2x10 Supine chest press 2# WaTE bar 2x10 Supine skull crusher no weight 2x10 with MWM for elbow ext Supine bicep setting into towel 2x10 Supine elbow PROM ext/flex full range x10 Supine shoulder ER 1# 2x10 Standing row red TB 2x10 Standing shoulder ext red TB 2x10   11/21/23 See HEP below    PATIENT EDUCATION:  Education details: Exam findings, POC, initial HEP Person educated: Patient Education method: Explanation, Demonstration, and Handouts Education comprehension: verbalized understanding, returned demonstration, and needs further education  HOME EXERCISE PROGRAM: Access Code: [email or web address] URL: [email or web address] Date: 11/22/2023 Prepared by: Gellen April Erman Hayward  Exercises - Supine Elbow Extension Stretch in Supination with tricep setting  - 1 x daily - 7  x weekly - 1 sets - 10 reps - Supine  Elbow Extension Stretch in Pronation with tricep setting - 1 x daily - 7 x weekly - 1 sets - 10 reps - Supine Elbow Flexion Extension AROM  - 1 x daily - 7 x weekly - 1 sets - 10 reps  ASSESSMENT:  CLINICAL IMPRESSION: Continues to complain of R shoulder pain. This did improve following STM and TPR to R lats. Some popping in elbow with elbow extension skull crushers, but this was relieved with pressure to distal triceps. Likely pt has some tightness here limiting elbow motion. Progressed elbow/forearm strengthening with good tolerance. Some discomfort in terminal elbow extension.    From eval: Patient is a 53 y.o. M who was seen today for physical therapy evaluation and treatment for R elbow stiffness and pain s/p R distal humeral elbow hardware removal. Assessment significant for gross R UE weakness, decreased ROM, increased edema and pain affecting pt's ability to perform lifting and UE tasks. Pt appears to be getting s/s of impingement in R UE with shoulder elevation and flexion which is also affecting his ability to reach and lift overhead. Pt will benefit from PT to address these issues for return to work.   OBJECTIVE IMPAIRMENTS: decreased coordination, decreased mobility, decreased ROM, decreased strength, hypomobility, increased edema, increased fascial restrictions, increased muscle spasms, impaired flexibility, impaired UE functional use, improper body mechanics, postural dysfunction, and pain.   ACTIVITY LIMITATIONS: carrying, lifting, bathing, toileting, dressing, reach over head, and hygiene/grooming  PARTICIPATION LIMITATIONS: meal prep, cleaning, laundry, driving, shopping, community activity, and occupation  PERSONAL FACTORS: Age, Fitness, Past/current experiences, and Time since onset of injury/illness/exacerbation are also affecting patient's functional outcome.   REHAB POTENTIAL: Good  CLINICAL DECISION MAKING: Evolving/moderate complexity  EVALUATION COMPLEXITY:  Moderate   GOALS: Goals reviewed with patient? Yes  SHORT TERM GOALS: Target date: 12/13/2023  Pt will be ind with initial HEP Baseline: Goal status: INITIAL  2.  Pt will have improved elbow ROM to 10-135 deg Baseline:  Goal status: INITIAL  3.  Pt will have improved shoulder flexion and abd by at least 10 deg Baseline:  Goal status: INITIAL   LONG TERM GOALS: Target date: 01/03/2024   Pt will be ind with management and progression of HEP Baseline:  Goal status: INITIAL  2.  Pt will have improved elbow ROM to at least 5-140 deg Baseline:  Goal status: INITIAL  3.  Pt will have full and pain free shoulder elevation for overhead tasks Baseline:  Goal status: INITIAL  4.  Pt will be able to tolerate lifting and carrying at least 50# for work tasks Baseline:  Goal status: INITIAL  5.  Pt will have R UE strength to at least 90% of L UE strength for work tasks Baseline:  Goal status: INITIAL  6.  Pt will demo MCID on his [employer] or UEFI Baseline:  Goal status: INITIAL   PLAN:  PT FREQUENCY: 2x/week  PT DURATION: 6 weeks  PLANNED INTERVENTIONS: 97164- PT Re-evaluation, 97110-Therapeutic exercises, 97530- Therapeutic activity, [id number]- Neuromuscular re-education, 97535- Self Care, 16109- Manual therapy, [id number]- Electrical stimulation (unattended), [phone]- Ionotophoresis 4mg /ml Dexamethasone , Patient/Family education, Taping, Dry Needling, Joint mobilization, Cryotherapy, and Moist heat  PLAN FOR NEXT SESSION: Perform a patient survey for UE. Continue elbow/shoulder ROM/strengthening, manual/joint mobs.    Fronie Jewett Jozlin Bently, PTA 12/19/2023, 2:20 PM

## 2023-12-22 ENCOUNTER — Encounter (HOSPITAL_BASED_OUTPATIENT_CLINIC_OR_DEPARTMENT_OTHER): Payer: Self-pay | Admitting: Physical Therapy

## 2023-12-22 ENCOUNTER — Ambulatory Visit (HOSPITAL_BASED_OUTPATIENT_CLINIC_OR_DEPARTMENT_OTHER): Payer: Worker's Compensation | Admitting: Physical Therapy

## 2023-12-22 DIAGNOSIS — M25621 Stiffness of right elbow, not elsewhere classified: Secondary | ICD-10-CM

## 2023-12-22 DIAGNOSIS — M25521 Pain in right elbow: Secondary | ICD-10-CM

## 2023-12-22 DIAGNOSIS — M6281 Muscle weakness (generalized): Secondary | ICD-10-CM

## 2023-12-22 DIAGNOSIS — R600 Localized edema: Secondary | ICD-10-CM

## 2023-12-22 NOTE — Therapy (Signed)
 OUTPATIENT PHYSICAL THERAPY TREATMENT   Patient Name: Mark Miranda MRN: [id number] DOB:[birth year], 53 y.o., male Today's Date: 12/22/2023  END OF SESSION:  PT End of Session - 12/22/23 0853     Visit Number 5    Number of Visits 12    Date for PT Re-Evaluation 01/03/24    Authorization Type Worker's Comp    PT Start Time [phone]    PT Stop Time 0933    PT Time Calculation (min) 42 min    Activity Tolerance Patient tolerated treatment well    Behavior During Therapy [facility] for tasks assessed/performed                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   Hypercholesterolemia    Hypertension    Metabolic acidosis    Recurrent peritonitis (HCC)    Type 1 diabetes with renal manifestations, controlled (HCC)    Vitamin D  deficiency    Past Surgical History:  Procedure Laterality Date   AV FISTULA PLACEMENT  01/10/2012   Procedure: ARTERIOVENOUS (AV) FISTULA CREATION;  Surgeon: Richrd Char, MD;  Location: [facility] OR;  Service: Vascular;  Laterality: Left;  Left Brachial Cephalic Arteriovenous Fistula   CAPD INSERTION  01/09/2012   Procedure: CONTINUOUS AMBULATORY PERITONEAL DIALYSIS  (CAPD) CATHETER INSERTION;  Surgeon: Levert Ready, MD;  Location: [facility] OR;  Service: General;  Laterality: N/A;  Exteriorization PD Cath   CAPSULOTOMY Right 11/07/2023   Procedure: CAPSULOTOMY, ELBOW;  Surgeon: Wilhelmenia Harada, MD;  Location: [facility];  Service: Orthopedics;  Laterality: Right;  RIGHT ELBOW CAPSULAR RELEASE   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   received left kidney, 3 renal arteries, 1 renal vein, ureter and pancreas from 53 year old   HARDWARE  REMOVAL Right 11/07/2023   Procedure: REMOVAL, HARDWARE;  Surgeon: Wilhelmenia Harada, MD;  Location: [facility];  Service: Orthopedics;  Laterality: Right;   INSERTION OF DIALYSIS CATHETER  01/13/2012   Procedure: INSERTION OF DIALYSIS CATHETER;  Surgeon: Mayo Speck, MD;  Location: [facility] OR;  Service: Vascular;  Laterality: Right;  Internal Jugular   LAPAROSCOPY  03/13/2012   Procedure: LAPAROSCOPY DIAGNOSTIC;  Surgeon: Eddye Goodie, MD;  Location: [facility] OR;  Service: General;  Laterality: N/A;   LIVER BIOPSY  04/02/2013   open   ORIF HUMERUS FRACTURE Right 03/07/2023   Procedure: OPEN REDUCTION INTERNAL FIXATION (ORIF) DISTAL HUMERUS FRACTURE;  Surgeon: Wilhelmenia Harada, MD;  Location: [facility] ORS;  Service: Orthopedics;  Laterality: Right;   PERITONEAL CATHETER INSERTION     PERITONEAL CATHETER REMOVAL     SMALL INTESTINE SURGERY  04/02/2013   TRICEPS TENDON REPAIR  03/07/2023   Procedure: TRICEPS TENDON REPAIR;  Surgeon: Wilhelmenia Harada, MD;  Location: [facility] ORS;  Service: Orthopedics;;   ULNAR NERVE TRANSPOSITION Right 03/07/2023   Procedure: ULNAR NERVE EXPLORATION;  Surgeon: Wilhelmenia Harada, MD;  Location: [facility] ORS;  Service: Orthopedics;  Laterality: Right;   Patient Active Problem List   Diagnosis Date Noted   Hyperlipidemia LDL goal <70 11/13/2023   Closed fracture of right distal humerus 03/07/2023   Closed bicondylar fracture of distal end of right humerus 03/04/2023   Type  2 diabetes mellitus with diabetic chronic kidney disease (HCC) 07/22/2022   Depression, major, 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/2022   Erectile dysfunction 07/24/2017   Vitamin D  deficiency, unspecified 07/24/2017   Low back pain 02/25/2016   Chronic fatigue 01/25/2016   Diabetic peripheral neuropathy (HCC) 11/24/2015   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   Depression 11/02/2015   Tobacco abuse 11/02/2015   End stage renal disease (HCC) 12/29/2011   HTN  (hypertension) 01/25/2011    PCP: [name], [name], MD  REFERRING PROVIDER: Wilhelmenia Harada, MD  REFERRING DIAG: [phone] (ICD-10-CM) - Other closed displaced fracture of distal end of right humerus, initial encounter  THERAPY DIAG:  Stiffness of right elbow, not elsewhere classified  Pain in right elbow  Muscle weakness (generalized)  Localized edema  Rationale for Evaluation and Treatment: Rehabilitation  ONSET DATE: 11/07/23 R distal humeral elbow hardware removal  SUBJECTIVE:   SUBJECTIVE STATEMENT: Pt is 6 weeks and 3 days s/p Right distal humeral removal of hardware and elbow ulnar nerve neurolysis.  Pt still has pain extending arm.  Pt states something begin developing in his R shoulder after surgery.  He has pain lying on R shoulder.  Pt reports he has pain with trying to pick stuff up.  Pt states he had a little increased pain after prior Rx.  Pt took Ibuprofen  after Rx.  Pt reports compliance with HEP.    PERTINENT HISTORY: Per 11/16/23: Status post right distal humeral elbow hardware removal.  At this time he is progressing with range of motion.  I would like him to work with physical therapy to progress his flexion and extension.   PAIN:  Are you having pain? Yes: NPRS scale: 3/10 / 0/10 Pain location: R elbow / shoulder Pain description: sharp Aggravating factors: Sleeping, leaning Relieving factors: massage  3/10 in the back of the elbow "More like a stretch"  PRECAUTIONS: None  RED FLAGS: None   WEIGHT BEARING RESTRICTIONS: No  FALLS:  Has patient fallen in last 6 months? 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  DIAGNOSTIC FINDINGS: R elbow x-ray 10/29/23 IMPRESSION: 1. Status post ORIF of the distal humerus without evidence of hardware complication. 2. Several calcific densities along the palmar aspect of the elbow joint may reflect loose bodies.  PATIENT SURVEYS:  TBD -- pt forgot his glasses, unable to read to do survey  COGNITION: Overall cognitive status: Within functional limits for tasks assessed     SENSATION: WFL  EDEMA:  Mild edema  MUSCLE LENGTH: Biceps: ROM below  POSTURE: rounded shoulders  PALPATION: TTP post elbow (tricep insertion) Firm end feel with ext and flexion Empty end feel shoulder flexion and abd (limited due to pt guarding/pain)  UPPER EXTREMITY ROM:  Active ROM (measured in supine) Right eval Left eval Right 5/16  Shoulder flexion 145*    Shoulder extension     Shoulder abduction 85*    Shoulder adduction     Shoulder extension     Shoulder internal rotation 80    Shoulder external rotation 75  Elbow flexion 115 145 143  Elbow extension -20 3 -12/-10 AROM/PROM  Wrist flexion     Wrist extension     Wrist ulnar deviation     Wrist radial deviation     Wrist pronation     Wrist supination      (Blank rows = not tested, * = pain)  UPPER EXTREMITY MMT:  MMT Right eval Left eval  Shoulder flexion 4   Shoulder extension    Shoulder abduction 4-   Shoulder adduction    Shoulder extension    Shoulder internal rotation 4-   Shoulder external rotation 3+   Middle trapezius    Lower trapezius    Elbow flexion (90 deg) 24.7, 23.6 38.3, 40.7  Elbow extension (90 deg) 12.9, 9.2 21.9, 28.3  Wrist flexion    Wrist extension    Wrist ulnar deviation    Wrist radial deviation    Wrist pronation    Wrist supination    Grip strength 35 lbs 55 lbs   (Blank rows = not tested)   FUNCTIONAL TESTS:  Did not assess  GAIT: Distance walked: Into clinic Assistive device  utilized: None Level of assistance: Complete Independence Comments: Normal reciprocal pattern                                                                                                                                TREATMENT DATE:   12/22/2023 Pulleys x 20 reps in flexion and abduction Pt received R elbow flex and extension PROM and forearm pronation and supination PROM in supine per pt and tissue tolerance. PT assessed ROM.  See above Elbow flexion 1#  2x10 Hammer curl 1# 2x10 Prone triceps extension 0# x20 Flex bar (red) U/Ns and twists x30ea Standing shoulder ext red TB 2x10    12/19/23 Manual: Grade II to III mobs for elbow ext and flexion, Grade II to III mobs for elbow distraction, STM to tricep, Radial head mobs grade II-III AP/PA glides, STM to lats, R shoulder PROM Supine shoulder flexion RTB 2 x 10 Supine bilateral ER RTB 2 x 10 Supine bicep curl 1# 2x10 Supine skull crusher no weight 2x10 Prone triceps extension 0# x20 Prone row 6# 2x10 Flex bar (red) U/Ns and twists x30ea Standing bicep curl with eccentric lowering 2# x20 Hammer curl 2# 2x10 Press out 1# 2x10   12/13/23 Manual: Grade II to III mobs for elbow ext and flexion, Grade II to III mobs for elbow distraction, STM to tricep, Radial head mobs grade II-III AP/PA glides Supine shoulder flexion RTB 2 x 10 Supine bilateral ER RTB 2 x 10 Supine horizontal abduction RTB 2 x 10  Supine skull crusher no weight 2x10 Sidelying ER 2# 3 x 10    12/01/23 Pulleys x1 min flexion, x 1 min abd Supine tricep setting at end range x10 Grade II to III mobs for elbow ext and flexion Grade II to III mobs for  elbow distraction Supine hammer curl 1# x10 Supine forearm pron/sup 1# x10 Supine wrist flex/ext 1# x10 in sup and then pronation elbow extended Supine shoulder flexion 2# WaTE bar 2x10 Supine chest press 2# WaTE bar 2x10 Supine skull crusher no weight 2x10 with MWM for elbow ext Supine bicep setting into towel  2x10 Supine elbow PROM ext/flex full range x10 Supine shoulder ER 1# 2x10 Standing row red TB 2x10 Standing shoulder ext red TB 2x10   11/21/23 See HEP below    PATIENT EDUCATION:  Education details: Exam findings, POC, initial HEP Person educated: Patient Education method: Explanation, Demonstration, and Handouts Education comprehension: verbalized understanding, returned demonstration, and needs further education  HOME EXERCISE PROGRAM: Access Code: [email or web address] URL: [email or web address] Date: 11/22/2023 Prepared by: Gellen April Erman Hayward  Exercises - Supine Elbow Extension Stretch in Supination with tricep setting  - 1 x daily - 7 x weekly - 1 sets - 10 reps - Supine Elbow Extension Stretch in Pronation with tricep setting - 1 x daily - 7 x weekly - 1 sets - 10 reps - Supine Elbow Flexion Extension AROM  - 1 x daily - 7 x weekly - 1 sets - 10 reps  ASSESSMENT:  CLINICAL IMPRESSION: Pt is progressing with elbow flexion and extension ROM.  Pt tolerated elbow PROM well.  Pt reports he is having some shoulder pain.  Pt reports R shoulder pain with end range flexion AROM in supine.  Pt performed exercises well with cuing for correct form.  He tolerated light strengthening exercises well.  Pt responded well to Rx reporting no increased pain, just some soreness and fatigue after Rx.  He should continue to benefit from cont skilled PT to improve ROM and strength, address ongoing goals, and to assist in restoring desired level of function.     OBJECTIVE IMPAIRMENTS: decreased coordination, decreased mobility, decreased ROM, decreased strength, hypomobility, increased edema, increased fascial restrictions, increased muscle spasms, impaired flexibility, impaired UE functional use, improper body mechanics, postural dysfunction, and pain.   ACTIVITY LIMITATIONS: carrying, lifting, bathing, toileting, dressing, reach over head, and hygiene/grooming  PARTICIPATION LIMITATIONS:  meal prep, cleaning, laundry, driving, shopping, community activity, and occupation  PERSONAL FACTORS: Age, Fitness, Past/current experiences, and Time since onset of injury/illness/exacerbation are also affecting patient's functional outcome.   REHAB POTENTIAL: Good  CLINICAL DECISION MAKING: Evolving/moderate complexity  EVALUATION COMPLEXITY: Moderate   GOALS: Goals reviewed with patient? Yes  SHORT TERM GOALS: Target date: 12/13/2023  Pt will be ind with initial HEP Baseline: Goal status: INITIAL  2.  Pt will have improved elbow ROM to 10-135 deg Baseline:  Goal status: INITIAL  3.  Pt will have improved shoulder flexion and abd by at least 10 deg Baseline:  Goal status: INITIAL   LONG TERM GOALS: Target date: 01/03/2024   Pt will be ind with management and progression of HEP Baseline:  Goal status: INITIAL  2.  Pt will have improved elbow ROM to at least 5-140 deg Baseline:  Goal status: INITIAL  3.  Pt will have full and pain free shoulder elevation for overhead tasks Baseline:  Goal status: INITIAL  4.  Pt will be able to tolerate lifting and carrying at least 50# for work tasks Baseline:  Goal status: INITIAL  5.  Pt will have R UE strength to at least 90% of L UE strength for work tasks Baseline:  Goal status: INITIAL  6.  Pt will demo MCID on his QuickD[employer] or UEFI Baseline:  Goal status:  INITIAL   PLAN:  PT FREQUENCY: 2x/week  PT DURATION: 6 weeks  PLANNED INTERVENTIONS: 16109- PT Re-evaluation, 97110-Therapeutic exercises, 97530- Therapeutic activity, [id number]- Neuromuscular re-education, 97535- Self Care, [id number]- Manual therapy, [id number]- Electrical stimulation (unattended), [phone]- Ionotophoresis 4mg /ml Dexamethasone , Patient/Family education, Taping, Dry Needling, Joint mobilization, Cryotherapy, and Moist heat  PLAN FOR NEXT SESSION: Perform a patient survey for UE. Continue elbow/shoulder ROM/strengthening, manual/joint mobs.    Trina Fujita  III PT, DPT 12/22/23 9:53 AM

## 2023-12-26 ENCOUNTER — Encounter (HOSPITAL_BASED_OUTPATIENT_CLINIC_OR_DEPARTMENT_OTHER): Payer: Self-pay | Admitting: Physical Therapy

## 2023-12-26 ENCOUNTER — Ambulatory Visit (HOSPITAL_BASED_OUTPATIENT_CLINIC_OR_DEPARTMENT_OTHER): Payer: Worker's Compensation | Admitting: Physical Therapy

## 2023-12-26 DIAGNOSIS — R600 Localized edema: Secondary | ICD-10-CM

## 2023-12-26 DIAGNOSIS — M25621 Stiffness of right elbow, not elsewhere classified: Secondary | ICD-10-CM | POA: Diagnosis not present

## 2023-12-26 DIAGNOSIS — M6281 Muscle weakness (generalized): Secondary | ICD-10-CM

## 2023-12-26 DIAGNOSIS — M25521 Pain in right elbow: Secondary | ICD-10-CM

## 2023-12-26 NOTE — Therapy (Signed)
 OUTPATIENT PHYSICAL THERAPY TREATMENT   Patient Name: Mark Miranda MRN: [id number] DOB:[birth year], 53 y.o., male Today's Date: 12/27/2023  END OF SESSION:  PT End of Session - 12/26/23 1017     Visit Number 6    Number of Visits 12    Date for PT Re-Evaluation 01/03/24    Authorization Type Worker's Comp    PT Start Time 0940    PT Stop Time 1015    PT Time Calculation (min) 35 min    Activity Tolerance Patient tolerated treatment well    Behavior During Therapy [facility] for tasks assessed/performed                 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   Hypercholesterolemia    Hypertension    Metabolic acidosis    Recurrent peritonitis (HCC)    Type 1 diabetes with renal manifestations, controlled (HCC)    Vitamin D  deficiency    Past Surgical History:  Procedure Laterality Date   AV FISTULA PLACEMENT  01/10/2012   Procedure: ARTERIOVENOUS (AV) FISTULA CREATION;  Surgeon: Richrd Char, MD;  Location: [facility] OR;  Service: Vascular;  Laterality: Left;  Left Brachial Cephalic Arteriovenous Fistula   CAPD INSERTION  01/09/2012   Procedure: CONTINUOUS AMBULATORY PERITONEAL DIALYSIS  (CAPD) CATHETER INSERTION;  Surgeon: Levert Ready, MD;  Location: [facility] OR;  Service: General;  Laterality: N/A;  Exteriorization PD Cath   CAPSULOTOMY Right 11/07/2023   Procedure: CAPSULOTOMY, ELBOW;  Surgeon: Wilhelmenia Harada, MD;  Location: [facility];  Service: Orthopedics;  Laterality: Right;  RIGHT ELBOW CAPSULAR RELEASE   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   received left kidney, 3 renal arteries, 1 renal vein, ureter and pancreas from 53 year old   HARDWARE  REMOVAL Right 11/07/2023   Procedure: REMOVAL, HARDWARE;  Surgeon: Wilhelmenia Harada, MD;  Location: [facility];  Service: Orthopedics;  Laterality: Right;   INSERTION OF DIALYSIS CATHETER  01/13/2012   Procedure: INSERTION OF DIALYSIS CATHETER;  Surgeon: Mayo Speck, MD;  Location: [facility] OR;  Service: Vascular;  Laterality: Right;  Internal Jugular   LAPAROSCOPY  03/13/2012   Procedure: LAPAROSCOPY DIAGNOSTIC;  Surgeon: Eddye Goodie, MD;  Location: [facility] OR;  Service: General;  Laterality: N/A;   LIVER BIOPSY  04/02/2013   open   ORIF HUMERUS FRACTURE Right 03/07/2023   Procedure: OPEN REDUCTION INTERNAL FIXATION (ORIF) DISTAL HUMERUS FRACTURE;  Surgeon: Wilhelmenia Harada, MD;  Location: [facility] ORS;  Service: Orthopedics;  Laterality: Right;   PERITONEAL CATHETER INSERTION     PERITONEAL CATHETER REMOVAL     SMALL INTESTINE SURGERY  04/02/2013   TRICEPS TENDON REPAIR  03/07/2023   Procedure: TRICEPS TENDON REPAIR;  Surgeon: Wilhelmenia Harada, MD;  Location: [facility];  Service: Orthopedics;;   ULNAR NERVE TRANSPOSITION Right 03/07/2023   Procedure: ULNAR NERVE EXPLORATION;  Surgeon: Wilhelmenia Harada, MD;  Location: [facility];  Service: Orthopedics;  Laterality: Right;   Patient Active Problem List   Diagnosis Date Noted   Hyperlipidemia LDL goal <70 11/13/2023   Closed fracture of right distal humerus 03/07/2023   Closed bicondylar fracture of distal end of right humerus 03/04/2023  Type 2 diabetes mellitus with diabetic chronic kidney disease (HCC) 07/22/2022   Depression, major, 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/2022   Erectile dysfunction 07/24/2017   Vitamin D  deficiency, unspecified 07/24/2017   Low back pain 02/25/2016   Chronic fatigue 01/25/2016   Diabetic peripheral neuropathy (HCC) 11/24/2015   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   Depression 11/02/2015   Tobacco abuse 11/02/2015   End stage renal disease (HCC) 12/29/2011   HTN  (hypertension) 01/25/2011    PCP: [name], [name], MD  REFERRING PROVIDER: Wilhelmenia Harada, MD  REFERRING DIAG: [phone] (ICD-10-CM) - Other closed displaced fracture of distal end of right humerus, initial encounter  THERAPY DIAG:  Stiffness of right elbow, not elsewhere classified  Pain in right elbow  Muscle weakness (generalized)  Localized edema  Rationale for Evaluation and Treatment: Rehabilitation  ONSET DATE: 11/07/23 R distal humeral elbow hardware removal  SUBJECTIVE:   SUBJECTIVE STATEMENT: Pt is 7 weeks s/p Right distal humeral removal of hardware and elbow ulnar nerve neurolysis.  Pt denies any adverse effects, just soreness.  Pt denies elbow pain at rest though has pain with mobility.  Pt still has pain extending arm.  Pt has pain with reaching including reaching overhead.  Pt states something begin developing in his R shoulder after surgery.  Pt reports he has pain with trying to pick stuff up.  Pt reports compliance with HEP.    PERTINENT HISTORY: Per 11/16/23: Status post right distal humeral elbow hardware removal.  At this time he is progressing with range of motion.  I would like him to work with physical therapy to progress his flexion and extension.   PAIN:  Are you having pain? Yes: NPRS scale: 0/10 / 0/10 Pain location: R elbow / shoulder Pain description: sharp Aggravating factors: Sleeping, leaning Relieving factors: massage  3/10 in the back of the elbow "More like a stretch"  PRECAUTIONS: None  RED FLAGS: None   WEIGHT BEARING RESTRICTIONS: No  FALLS:  Has patient fallen in last 6 months? 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  DIAGNOSTIC FINDINGS: R elbow x-ray 10/29/23 IMPRESSION: 1. Status post ORIF of the distal humerus without evidence of hardware complication. 2. Several calcific densities along the palmar aspect of the elbow joint may reflect loose bodies.  PATIENT SURVEYS:  TBD -- pt forgot his glasses, unable to read to do survey  COGNITION: Overall cognitive status: Within functional limits for tasks assessed     SENSATION: WFL  EDEMA:  Mild edema  MUSCLE LENGTH: Biceps: ROM below  POSTURE: rounded shoulders  PALPATION: TTP post elbow (tricep insertion) Firm end feel with ext and flexion Empty end feel shoulder flexion and abd (limited due to pt guarding/pain)  UPPER EXTREMITY ROM:  Active ROM (measured in supine) Right eval Left eval Right 5/16  Shoulder flexion 145*    Shoulder extension     Shoulder abduction 85*    Shoulder adduction     Shoulder extension     Shoulder internal rotation 80    Shoulder external rotation 75  Elbow flexion 115 145 143  Elbow extension -20 3 -12/-10 AROM/PROM  Wrist flexion     Wrist extension     Wrist ulnar deviation     Wrist radial deviation     Wrist pronation     Wrist supination      (Blank rows = not tested, * = pain)  UPPER EXTREMITY MMT:  MMT Right eval Left eval  Shoulder flexion 4   Shoulder extension    Shoulder abduction 4-   Shoulder adduction    Shoulder extension    Shoulder internal rotation 4-   Shoulder external rotation 3+   Middle trapezius    Lower trapezius    Elbow flexion (90 deg) 24.7, 23.6 38.3, 40.7  Elbow extension (90 deg) 12.9, 9.2 21.9, 28.3  Wrist flexion    Wrist extension    Wrist ulnar deviation    Wrist radial deviation    Wrist pronation    Wrist supination    Grip strength 35 lbs 55 lbs   (Blank rows = not tested)   FUNCTIONAL TESTS:  Did not assess  GAIT: Distance walked:  Into clinic Assistive device utilized: None Level of assistance: Complete Independence Comments: Normal reciprocal pattern                                                                                                                                TREATMENT DATE:   12/26/2023 Pulleys x 20 reps in flexion and abduction Pt received R elbow flex and extension PROM and forearm pronation and supination PROM in supine per pt and tissue tolerance. Elbow flexion 1#  x15, 2# x10 Hammer curl 1# x10, 2# x10 Prone triceps extension 0# x20 Flex bar (red) U/Ns and twists x30ea Supine shoulder ABC x 1 rep Standing bilat ER with RTB approx 6 reps S/L ER x 10 Standing shoulder ext red TB 3x10 Standing row with RTB 2x10  12/22/2023 Pulleys x 20 reps in flexion and abduction Pt received R elbow flex and extension PROM and forearm pronation and supination PROM in supine per pt and tissue tolerance. PT assessed ROM.  See above Elbow flexion 1#  2x10 Hammer curl 1# 2x10 Prone triceps extension 0# x20 Flex bar (red) U/Ns and twists x30ea Standing shoulder ext red TB 2x10    12/19/23 Manual: Grade II to III mobs for elbow ext and flexion, Grade II to III mobs for elbow distraction, STM to tricep, Radial head mobs grade II-III AP/PA glides, STM to lats, R shoulder PROM Supine shoulder flexion RTB 2 x 10 Supine bilateral ER RTB 2 x 10 Supine bicep curl 1# 2x10 Supine skull crusher no weight 2x10 Prone triceps extension 0# x20 Prone row 6# 2x10 Flex bar (red) U/Ns and twists x30ea Standing bicep curl with eccentric lowering 2# x20 Hammer curl 2# 2x10 Press out 1# 2x10   12/13/23 Manual: Grade II to III mobs for elbow ext and flexion, Grade II  to III mobs for elbow distraction, STM to tricep, Radial head mobs grade II-III AP/PA glides Supine shoulder flexion RTB 2 x 10 Supine bilateral ER RTB 2 x 10 Supine horizontal abduction RTB 2 x 10  Supine skull crusher no weight 2x10 Sidelying ER 2# 3 x  10    12/01/23 Pulleys x1 min flexion, x 1 min abd Supine tricep setting at end range x10 Grade II to III mobs for elbow ext and flexion Grade II to III mobs for elbow distraction Supine hammer curl 1# x10 Supine forearm pron/sup 1# x10 Supine wrist flex/ext 1# x10 in sup and then pronation elbow extended Supine shoulder flexion 2# WaTE bar 2x10 Supine chest press 2# WaTE bar 2x10 Supine skull crusher no weight 2x10 with MWM for elbow ext Supine bicep setting into towel 2x10 Supine elbow PROM ext/flex full range x10 Supine shoulder ER 1# 2x10 Standing row red TB 2x10 Standing shoulder ext red TB 2x10   11/21/23 See HEP below    PATIENT EDUCATION:  Education details: Exercise form, POC, HEP Person educated: Patient Education method: Explanation, Demonstration, verbal and tactile cues Education comprehension: verbalized understanding, returned demonstration, and needs further education, verbal and tactile cues required  HOME EXERCISE PROGRAM: Access Code: [email or web address] URL: [email or web address] Date: 11/22/2023 Prepared by: Gellen April Erman Hayward  Exercises - Supine Elbow Extension Stretch in Supination with tricep setting  - 1 x daily - 7 x weekly - 1 sets - 10 reps - Supine Elbow Extension Stretch in Pronation with tricep setting - 1 x daily - 7 x weekly - 1 sets - 10 reps - Supine Elbow Flexion Extension AROM  - 1 x daily - 7 x weekly - 1 sets - 10 reps  ASSESSMENT:  CLINICAL IMPRESSION: Pt reports having elbow pain with mobility and continues to have shoulder pain.  Pt is progressing with elbow flexion and extension ROM.  He tolerated elbow PROM well.  Pt reports having pain in R shoulder with both ER exercises (seated with theraband and S/L'ing without weight).  Pt performed exercises well with cuing and instruction in correct form.  Pt states he has a little more pain after Rx, 3/10.  He should continue to benefit from cont skilled PT to improve ROM and  strength, address ongoing goals, and to assist in restoring desired level of function.          OBJECTIVE IMPAIRMENTS: decreased coordination, decreased mobility, decreased ROM, decreased strength, hypomobility, increased edema, increased fascial restrictions, increased muscle spasms, impaired flexibility, impaired UE functional use, improper body mechanics, postural dysfunction, and pain.   ACTIVITY LIMITATIONS: carrying, lifting, bathing, toileting, dressing, reach over head, and hygiene/grooming  PARTICIPATION LIMITATIONS: meal prep, cleaning, laundry, driving, shopping, community activity, and occupation  PERSONAL FACTORS: Age, Fitness, Past/current experiences, and Time since onset of injury/illness/exacerbation are also affecting patient's functional outcome.   REHAB POTENTIAL: Good  CLINICAL DECISION MAKING: Evolving/moderate complexity  EVALUATION COMPLEXITY: Moderate   GOALS: Goals reviewed with patient? Yes  SHORT TERM GOALS: Target date: 12/13/2023  Pt will be ind with initial HEP Baseline: Goal status: INITIAL  2.  Pt will have improved elbow ROM to 10-135 deg Baseline:  Goal status: INITIAL  3.  Pt will have improved shoulder flexion and abd by at least 10 deg Baseline:  Goal status: INITIAL   LONG TERM GOALS: Target date: 01/03/2024   Pt will be ind with management and progression of HEP Baseline:  Goal status: INITIAL  2.  Pt will have  improved elbow ROM to at least 5-140 deg Baseline:  Goal status: INITIAL  3.  Pt will have full and pain free shoulder elevation for overhead tasks Baseline:  Goal status: INITIAL  4.  Pt will be able to tolerate lifting and carrying at least 50# for work tasks Baseline:  Goal status: INITIAL  5.  Pt will have R UE strength to at least 90% of L UE strength for work tasks Baseline:  Goal status: INITIAL  6.  Pt will demo MCID on his QuickDASH or UEFI Baseline:  Goal status: INITIAL   PLAN:  PT FREQUENCY:  2x/week  PT DURATION: 6 weeks  PLANNED INTERVENTIONS: 97164- PT Re-evaluation, 97110-Therapeutic exercises, 97530- Therapeutic activity, [id number]- Neuromuscular re-education, 97535- Self Care, [id number]- Manual therapy, [id number]- Electrical stimulation (unattended), 97033- Ionotophoresis 4mg /ml Dexamethasone , Patient/Family education, Taping, Dry Needling, Joint mobilization, Cryotherapy, and Moist heat  PLAN FOR NEXT SESSION: Perform a patient survey for UE. Continue elbow/shoulder ROM/strengthening, manual/joint mobs.    Trina Fujita III PT, DPT 12/27/23 10:07 PM

## 2023-12-29 ENCOUNTER — Ambulatory Visit (HOSPITAL_BASED_OUTPATIENT_CLINIC_OR_DEPARTMENT_OTHER): Payer: Worker's Compensation | Attending: Orthopaedic Surgery

## 2023-12-29 ENCOUNTER — Encounter (HOSPITAL_BASED_OUTPATIENT_CLINIC_OR_DEPARTMENT_OTHER): Payer: Self-pay

## 2023-12-29 DIAGNOSIS — M25521 Pain in right elbow: Secondary | ICD-10-CM | POA: Insufficient documentation

## 2023-12-29 DIAGNOSIS — M25621 Stiffness of right elbow, not elsewhere classified: Secondary | ICD-10-CM | POA: Insufficient documentation

## 2023-12-29 DIAGNOSIS — R600 Localized edema: Secondary | ICD-10-CM | POA: Insufficient documentation

## 2023-12-29 DIAGNOSIS — M6281 Muscle weakness (generalized): Secondary | ICD-10-CM | POA: Diagnosis present

## 2023-12-29 NOTE — Therapy (Signed)
 OUTPATIENT PHYSICAL THERAPY TREATMENT   Patient Name: Mark Miranda MRN: [id number] DOB:[birth year], 53 y.o., male Today's Date: 12/29/2023  END OF SESSION:  PT End of Session - 12/29/23 1023     Visit Number 7    Number of Visits 12    Date for PT Re-Evaluation 01/03/24    Authorization Type Worker's Comp    PT Start Time 1011    PT Stop Time 1055    PT Time Calculation (min) 44 min    Activity Tolerance Patient tolerated treatment well    Behavior During Therapy WFL for tasks assessed/performed                  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   Hypercholesterolemia    Hypertension    Metabolic acidosis    Recurrent peritonitis (HCC)    Type 1 diabetes with renal manifestations, controlled (HCC)    Vitamin D  deficiency    Past Surgical History:  Procedure Laterality Date   AV FISTULA PLACEMENT  01/10/2012   Procedure: ARTERIOVENOUS (AV) FISTULA CREATION;  Surgeon: Richrd Char, MD;  Location: [facility] OR;  Service: Vascular;  Laterality: Left;  Left Brachial Cephalic Arteriovenous Fistula   CAPD INSERTION  01/09/2012   Procedure: CONTINUOUS AMBULATORY PERITONEAL DIALYSIS  (CAPD) CATHETER INSERTION;  Surgeon: Levert Ready, MD;  Location: [facility] OR;  Service: General;  Laterality: N/A;  Exteriorization PD Cath   CAPSULOTOMY Right 11/07/2023   Procedure: CAPSULOTOMY, ELBOW;  Surgeon: Wilhelmenia Harada, MD;  Location: [facility];  Service: Orthopedics;  Laterality: Right;  RIGHT ELBOW CAPSULAR RELEASE   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   received left kidney, 3 renal arteries, 1 renal vein, ureter and pancreas from 53 year old    HARDWARE REMOVAL Right 11/07/2023   Procedure: REMOVAL, HARDWARE;  Surgeon: Wilhelmenia Harada, MD;  Location: [facility];  Service: Orthopedics;  Laterality: Right;   INSERTION OF DIALYSIS CATHETER  01/13/2012   Procedure: INSERTION OF DIALYSIS CATHETER;  Surgeon: Mayo Speck, MD;  Location: [facility] OR;  Service: Vascular;  Laterality: Right;  Internal Jugular   LAPAROSCOPY  03/13/2012   Procedure: LAPAROSCOPY DIAGNOSTIC;  Surgeon: Eddye Goodie, MD;  Location: [facility] OR;  Service: General;  Laterality: N/A;   LIVER BIOPSY  04/02/2013   open   ORIF HUMERUS FRACTURE Right 03/07/2023   Procedure: OPEN REDUCTION INTERNAL FIXATION (ORIF) DISTAL HUMERUS FRACTURE;  Surgeon: Wilhelmenia Harada, MD;  Location: [facility] ORS;  Service: Orthopedics;  Laterality: Right;   PERITONEAL CATHETER INSERTION     PERITONEAL CATHETER REMOVAL     SMALL INTESTINE SURGERY  04/02/2013   TRICEPS TENDON REPAIR  03/07/2023   Procedure: TRICEPS TENDON REPAIR;  Surgeon: Wilhelmenia Harada, MD;  Location: [facility] ORS;  Service: Orthopedics;;   ULNAR NERVE TRANSPOSITION Right 03/07/2023   Procedure: ULNAR NERVE EXPLORATION;  Surgeon: Wilhelmenia Harada, MD;  Location: [facility] ORS;  Service: Orthopedics;  Laterality: Right;   Patient Active Problem List   Diagnosis Date Noted   Hyperlipidemia LDL goal <70 11/13/2023   Closed fracture of right distal humerus 03/07/2023   Closed bicondylar fracture of distal end of right humerus 03/04/2023  Type 2 diabetes mellitus with diabetic chronic kidney disease (HCC) 07/22/2022   Depression, major, 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/2022   Erectile dysfunction 07/24/2017   Vitamin D  deficiency, unspecified 07/24/2017   Low back pain 02/25/2016   Chronic fatigue 01/25/2016   Diabetic peripheral neuropathy (HCC) 11/24/2015   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   Depression 11/02/2015   Tobacco abuse 11/02/2015   End stage renal disease (HCC) 12/29/2011   HTN  (hypertension) 01/25/2011    PCP: [name], [name], MD  REFERRING PROVIDER: Wilhelmenia Harada, MD  REFERRING DIAG: [phone] (ICD-10-CM) - Other closed displaced fracture of distal end of right humerus, initial encounter  THERAPY DIAG:  Stiffness of right elbow, not elsewhere classified  Pain in right elbow  Muscle weakness (generalized)  Localized edema  Rationale for Evaluation and Treatment: Rehabilitation  ONSET DATE: 11/07/23 R distal humeral elbow hardware removal  SUBJECTIVE:   SUBJECTIVE STATEMENT: Pt reports ongoing shoulder pain with certain movements. He denies elbow pain at entry, but does have some after PT sessions.     PERTINENT HISTORY: Per 11/16/23: Status post right distal humeral elbow hardware removal.  At this time he is progressing with range of motion.  I would like him to work with physical therapy to progress his flexion and extension.   PAIN:  Are you having pain? Yes: NPRS scale: 0/10 / 0/10 Pain location: R elbow / shoulder Pain description: sharp Aggravating factors: Sleeping, leaning Relieving factors: massage  3/10 in the back of the elbow "More like a stretch"  PRECAUTIONS: None  RED FLAGS: None   WEIGHT BEARING RESTRICTIONS: No  FALLS:  Has patient fallen in last 6 months? 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  DIAGNOSTIC FINDINGS: R elbow x-ray 10/29/23 IMPRESSION: 1. Status post ORIF of the distal humerus without evidence of hardware complication. 2. Several calcific densities along the palmar aspect of the elbow joint  may reflect loose bodies.  PATIENT SURVEYS:  TBD -- pt forgot his glasses, unable to read to do survey  COGNITION: Overall cognitive status: Within functional limits for tasks assessed     SENSATION: WFL  EDEMA:  Mild edema  MUSCLE LENGTH: Biceps: ROM below  POSTURE: rounded shoulders  PALPATION: TTP post elbow (tricep insertion) Firm end feel with ext and flexion Empty end feel shoulder flexion and abd (limited due to pt guarding/pain)  UPPER EXTREMITY ROM:  Active ROM (measured in supine) Right eval Left eval Right 5/16  Shoulder flexion 145*    Shoulder extension     Shoulder abduction 85*    Shoulder adduction     Shoulder extension     Shoulder internal rotation 80    Shoulder external rotation 75    Elbow flexion 115 145 143  Elbow extension -20 3 -12/-10 AROM/PROM  Wrist flexion     Wrist extension     Wrist ulnar deviation     Wrist radial deviation     Wrist pronation     Wrist supination      (Blank rows = not tested, * =  pain)  UPPER EXTREMITY MMT:  MMT Right eval Left eval  Shoulder flexion 4   Shoulder extension    Shoulder abduction 4-   Shoulder adduction    Shoulder extension    Shoulder internal rotation 4-   Shoulder external rotation 3+   Middle trapezius    Lower trapezius    Elbow flexion (90 deg) 24.7, 23.6 38.3, 40.7  Elbow extension (90 deg) 12.9, 9.2 21.9, 28.3  Wrist flexion    Wrist extension    Wrist ulnar deviation    Wrist radial deviation    Wrist pronation    Wrist supination    Grip strength 35 lbs 55 lbs   (Blank rows = not tested)   FUNCTIONAL TESTS:  Did not assess  GAIT: Distance walked: Into clinic Assistive device utilized: None Level of assistance: Complete Independence Comments: Normal reciprocal pattern                                                                                                                                TREATMENT DATE:   12/29/2023 Pt received R elbow flex and  extension PROM and forearm pronation and supination PROM in supine per pt and tissue tolerance. STM to supinator m, pec, lat, periscap mm Elbow flexion 1#  x10, 4# x10 Hammer curl 1# x10, 4# x10 Seated press out 3# x15 Prone triceps extension 0# x30 Flex bar (red) U/Ns and twists x30ea Supine shoulder ABC x 1 rep UBE 4 min- alternating fwd/back each minute   12/26/2023 Pulleys x 20 reps in flexion and abduction Pt received R elbow flex and extension PROM and forearm pronation and supination PROM in supine per pt and tissue tolerance. Elbow flexion 1#  x15, 2# x10 Hammer curl 1# x10, 2# x10 Prone triceps extension 0# x20 Flex bar (red) U/Ns and twists x30ea Supine shoulder ABC x 1 rep Standing bilat ER with RTB approx 6 reps S/L ER x 10 Standing shoulder ext red TB 3x10 Standing row with RTB 2x10  12/22/2023 Pulleys x 20 reps in flexion and abduction Pt received R elbow flex and extension PROM and forearm pronation and supination PROM in supine per pt and tissue tolerance. PT assessed ROM.  See above Elbow flexion 1#  2x10 Hammer curl 1# 2x10 Prone triceps extension 0# x20 Flex bar (red) U/Ns and twists x30ea Standing shoulder ext red TB 2x10    12/19/23 Manual: Grade II to III mobs for elbow ext and flexion, Grade II to III mobs for elbow distraction, STM to tricep, Radial head mobs grade II-III AP/PA glides, STM to lats, R shoulder PROM Supine shoulder flexion RTB 2 x 10 Supine bilateral ER RTB 2 x 10 Supine bicep curl 1# 2x10 Supine skull crusher no weight 2x10 Prone triceps extension 0# x20 Prone row 6# 2x10 Flex bar (red) U/Ns and twists x30ea Standing bicep curl with eccentric lowering 2# x20 Hammer curl 2# 2x10 Press out 1# 2x10  PATIENT EDUCATION:  Education details: Exercise form, POC, HEP Person educated: Patient Education method: Explanation, Demonstration, verbal and tactile cues Education comprehension: verbalized understanding, returned demonstration,  and needs further education, verbal and tactile cues required  HOME EXERCISE PROGRAM: Access Code: [email or web address] URL: [email or web address] Date: 11/22/2023 Prepared by: Gellen April Erman Hayward  Exercises - Supine Elbow Extension Stretch in Supination with tricep setting  - 1 x daily - 7 x weekly - 1 sets - 10 reps - Supine Elbow Extension Stretch in Pronation with tricep setting - 1 x daily - 7 x weekly - 1 sets - 10 reps - Supine Elbow Flexion Extension AROM  - 1 x daily - 7 x weekly - 1 sets - 10 reps  ASSESSMENT:  CLINICAL IMPRESSION: Mild discomfort present in elbow with resisted tasks. Shoulder bothers him with full elevation. Notable tightness palpated in R pecs, lats and periscpaular mm. Instructed pt in self STM/TPR with tennis ball to perform at home to decrease tightness here. Pt able to progress with strengthening with overall good tolerance. Will monitor for soreness/pain.        OBJECTIVE IMPAIRMENTS: decreased coordination, decreased mobility, decreased ROM, decreased strength, hypomobility, increased edema, increased fascial restrictions, increased muscle spasms, impaired flexibility, impaired UE functional use, improper body mechanics, postural dysfunction, and pain.   ACTIVITY LIMITATIONS: carrying, lifting, bathing, toileting, dressing, reach over head, and hygiene/grooming  PARTICIPATION LIMITATIONS: meal prep, cleaning, laundry, driving, shopping, community activity, and occupation  PERSONAL FACTORS: Age, Fitness, Past/current experiences, and Time since onset of injury/illness/exacerbation are also affecting patient's functional outcome.   REHAB POTENTIAL: Good  CLINICAL DECISION MAKING: Evolving/moderate complexity  EVALUATION COMPLEXITY: Moderate   GOALS: Goals reviewed with patient? Yes  SHORT TERM GOALS: Target date: 12/13/2023  Pt will be ind with initial HEP Baseline: Goal status: INITIAL  2.  Pt will have improved elbow ROM to 10-135  deg Baseline:  Goal status: INITIAL  3.  Pt will have improved shoulder flexion and abd by at least 10 deg Baseline:  Goal status: INITIAL   LONG TERM GOALS: Target date: 01/03/2024   Pt will be ind with management and progression of HEP Baseline:  Goal status: INITIAL  2.  Pt will have improved elbow ROM to at least 5-140 deg Baseline:  Goal status: INITIAL  3.  Pt will have full and pain free shoulder elevation for overhead tasks Baseline:  Goal status: INITIAL  4.  Pt will be able to tolerate lifting and carrying at least 50# for work tasks Baseline:  Goal status: INITIAL  5.  Pt will have R UE strength to at least 90% of L UE strength for work tasks Baseline:  Goal status: INITIAL  6.  Pt will demo MCID on his Quick[employer] or UEFI Baseline:  Goal status: INITIAL   PLAN:  PT FREQUENCY: 2x/week  PT DURATION: 6 weeks  PLANNED INTERVENTIONS: 97164- PT Re-evaluation, 97110-Therapeutic exercises, 97530- Therapeutic activity, [id number]- Neuromuscular re-education, 97535- Self Care, 72536- Manual therapy, [id number]- Electrical stimulation (unattended), [phone]- Ionotophoresis 4mg /ml Dexamethasone , Patient/Family education, Taping, Dry Needling, Joint mobilization, Cryotherapy, and Moist heat  PLAN FOR NEXT SESSION: Perform a patient survey for UE. Continue elbow/shoulder ROM/strengthening, manual/joint mobs.   Herb Loges, PTA  12/29/23 12:27 PM

## 2024-01-01 NOTE — Therapy (Signed)
 OUTPATIENT PHYSICAL THERAPY TREATMENT   Patient Name: Mark Miranda MRN: [id number] DOB:08-05-1971, 53 y.o., male Today's Date: 01/02/2024  END OF SESSION:  PT End of Session - 01/02/24 0854     Visit Number 8    Number of Visits 12    Date for PT Re-Evaluation 01/03/24    Authorization Type Worker's Comp    PT Start Time [phone]    PT Stop Time 0935    PT Time Calculation (min) 44 min    Activity Tolerance Patient tolerated treatment well    Behavior During Therapy [facility] for tasks assessed/performed                   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   Hypercholesterolemia    Hypertension    Metabolic acidosis    Recurrent peritonitis (HCC)    Type 1 diabetes with renal manifestations, controlled (HCC)    Vitamin D  deficiency    Past Surgical History:  Procedure Laterality Date   AV FISTULA PLACEMENT  01/10/2012   Procedure: ARTERIOVENOUS (AV) FISTULA CREATION;  Surgeon: Richrd Char, MD;  Location[facility];  Service: Vascular;  Laterality: Left;  Left Brachial Cephalic Arteriovenous Fistula   CAPD INSERTION  01/09/2012   Procedure: CONTINUOUS AMBULATORY PERITONEAL DIALYSIS  (CAPD) CATHETER INSERTION;  Surgeon: Levert Ready, MD;  Location: [facility] OR;  Service: General;  Laterality: N/A;  Exteriorization PD Cath   CAPSULOTOMY Right 11/07/2023   Procedure: CAPSULOTOMY, ELBOW;  Surgeon: Wilhelmenia Harada, MD;  Location: [facility];  Service: Orthopedics;  Laterality: Right;  RIGHT ELBOW CAPSULAR RELEASE   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   received left kidney, 3 renal arteries, 1 renal vein, ureter and pancreas from 53 year old    HARDWARE REMOVAL Right 11/07/2023   Procedure: REMOVAL, HARDWARE;  Surgeon: Wilhelmenia Harada, MD;  Location: [facility];  Service: Orthopedics;  Laterality: Right;   INSERTION OF DIALYSIS CATHETER  01/13/2012   Procedure: INSERTION OF DIALYSIS CATHETER;  Surgeon: Mayo Speck, MD;  Location: [facility] OR;  Service: Vascular;  Laterality: Right;  Internal Jugular   LAPAROSCOPY  03/13/2012   Procedure: LAPAROSCOPY DIAGNOSTIC;  Surgeon: Eddye Goodie, MD;  Location: [facility] OR;  Service: General;  Laterality: N/A;   LIVER BIOPSY  04/02/2013   open   ORIF HUMERUS FRACTURE Right 03/07/2023   Procedure: OPEN REDUCTION INTERNAL FIXATION (ORIF) DISTAL HUMERUS FRACTURE;  Surgeon: Wilhelmenia Harada, MD;  Location: [facility] ORS;  Service: Orthopedics;  Laterality: Right;   PERITONEAL CATHETER INSERTION     PERITONEAL CATHETER REMOVAL     SMALL INTESTINE SURGERY  04/02/2013   TRICEPS TENDON REPAIR  03/07/2023   Procedure: TRICEPS TENDON REPAIR;  Surgeon: Wilhelmenia Harada, MD;  Location: [facility] ORS;  Service: Orthopedics;;   ULNAR NERVE TRANSPOSITION Right 03/07/2023   Procedure: ULNAR NERVE EXPLORATION;  Surgeon: Wilhelmenia Harada, MD;  Location: [facility] ORS;  Service: Orthopedics;  Laterality: Right;   Patient Active Problem List   Diagnosis Date Noted   Hyperlipidemia LDL goal <70 11/13/2023   Closed fracture of right distal humerus 03/07/2023   Closed bicondylar fracture of distal end of right humerus 03/04/2023  Type 2 diabetes mellitus with diabetic chronic kidney disease (HCC) 07/22/2022   Depression, major, 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/2022   Erectile dysfunction 07/24/2017   Vitamin D  deficiency, unspecified 07/24/2017   Low back pain 02/25/2016   Chronic fatigue 01/25/2016   Diabetic peripheral neuropathy (HCC) 11/24/2015   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   Depression 11/02/2015   Tobacco abuse 11/02/2015   End stage renal disease (HCC) 12/29/2011   HTN  (hypertension) 01/25/2011    PCP: [name], [name], MD  REFERRING PROVIDER: Wilhelmenia Harada, MD  REFERRING DIAG: [phone] (ICD-10-CM) - Other closed displaced fracture of distal end of right humerus, initial encounter  THERAPY DIAG:  Stiffness of right elbow, not elsewhere classified  Pain in right elbow  Muscle weakness (generalized)  Localized edema  Rationale for Evaluation and Treatment: Rehabilitation  ONSET DATE: 11/07/23 R distal humeral elbow hardware removal  SUBJECTIVE:   SUBJECTIVE STATEMENT: Pt is 8 weeks s/p Right distal humeral removal of hardware and elbow ulnar nerve neurolysis.  Pt reports he felt normal after prior Rx.  Pt has some pain with end range extension.  Pt reports ongoing shoulder pain with certain movements.  Pt is having a FCE on 6/17.      PERTINENT HISTORY: Per 11/16/23: Status post right distal humeral elbow hardware removal.  At this time he is progressing with range of motion.  I would like him to work with physical therapy to progress his flexion and extension.   PAIN:  Are you having pain? Yes: NPRS scale: very little pain at rest Pain location: R elbow / shoulder Pain description: sharp Aggravating factors: Sleeping, leaning Relieving factors: massage   PRECAUTIONS: None  RED FLAGS: None   WEIGHT BEARING RESTRICTIONS: No  FALLS:  Has patient fallen in last 6 months? 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  DIAGNOSTIC FINDINGS: R elbow x-ray 10/29/23 IMPRESSION: 1. Status post ORIF of the distal humerus without evidence  of hardware complication. 2. Several calcific densities along the palmar aspect of the elbow joint may reflect loose bodies.  PATIENT SURVEYS:  TBD -- pt forgot his glasses, unable to read to do survey  COGNITION: Overall cognitive status: Within functional limits for tasks assessed     SENSATION: WFL  EDEMA:  Mild edema  MUSCLE LENGTH: Biceps: ROM below  POSTURE: rounded shoulders  PALPATION: TTP post elbow (tricep insertion) Firm end feel with ext and flexion Empty end feel shoulder flexion and abd (limited due to pt guarding/pain)  UPPER EXTREMITY ROM:  Active ROM (measured in supine) Right eval Left eval Right 5/16 Right 5/27  Left 5/27  Shoulder flexion 145*   148 with pain   Shoulder extension       Shoulder abduction 85*   118 with pain 157  Shoulder adduction       Shoulder extension       Shoulder internal rotation 80      Shoulder external rotation 75      Elbow flexion 115 145 143 144   Elbow extension -20 3 -12/-10  AROM/PROM -15/-11 AROM/PROM   Wrist flexion       Wrist extension       Wrist ulnar deviation       Wrist radial deviation       Wrist pronation       Wrist supination        (Blank rows = not tested, * = pain)  UPPER EXTREMITY MMT:  MMT Right eval Left eval  Shoulder flexion 4   Shoulder extension    Shoulder abduction 4-   Shoulder adduction    Shoulder extension    Shoulder internal rotation 4-   Shoulder external rotation 3+   Middle trapezius    Lower trapezius    Elbow flexion (90 deg) 24.7, 23.6 38.3, 40.7  Elbow extension (90 deg) 12.9, 9.2 21.9, 28.3  Wrist flexion    Wrist extension    Wrist ulnar deviation    Wrist radial deviation    Wrist pronation    Wrist supination    Grip strength 35 lbs 55 lbs   (Blank rows = not tested)   FUNCTIONAL TESTS:  Did not assess  GAIT: Distance walked: Into clinic Assistive device utilized: None Level of assistance: Complete Independence Comments: Normal  reciprocal pattern                                                                                                                                TREATMENT DATE:   01/02/2024 PT assessed shoulder and elbow ROM.  See above  UBE 4 min- alternating fwd/back each minute Pt received R elbow flex and extension PROM and forearm pronation and supination PROM in supine per pt and tissue tolerance. Elbow flexion 2#  x10, 4# x10 Hammer curl 1# x10, 4# x10 Flex bar (red) U/Ns and twists x30ea Standing shoulder ext red TB 2x10     12/29/2023 Pt received R elbow flex and extension PROM and forearm pronation and supination PROM in supine per pt and tissue tolerance. STM to supinator m, pec, lat, periscap mm Elbow flexion 1#  x10, 4# x10 Hammer curl 1# x10, 4# x10 Seated press out 3# x15 Prone triceps extension 0# x30 Flex bar (red) U/Ns and twists x30ea Supine shoulder ABC x 1 rep UBE 4 min- alternating fwd/back each minute   12/26/2023 Pulleys x 20 reps in flexion and abduction Pt received R elbow flex and extension PROM and forearm pronation and supination PROM in supine per pt and tissue tolerance. Elbow flexion 1#  x15, 2# x10 Hammer curl 1# x10, 2# x10 Prone triceps extension 0# x20 Flex bar (red) U/Ns and twists x30ea Supine shoulder ABC x 1 rep Standing bilat ER with RTB approx 6 reps S/L ER x 10 Standing shoulder ext red TB 3x10 Standing row with RTB 2x10  12/22/2023 Pulleys x 20 reps in flexion and abduction Pt received R elbow flex and extension PROM and forearm pronation and supination PROM in supine per pt  and tissue tolerance. PT assessed ROM.  See above Elbow flexion 1#  2x10 Hammer curl 1# 2x10 Prone triceps extension 0# x20 Flex bar (red) U/Ns and twists x30ea Standing shoulder ext red TB 2x10    12/19/23 Manual: Grade II to III mobs for elbow ext and flexion, Grade II to III mobs for elbow distraction, STM to tricep, Radial head mobs grade II-III AP/PA glides,  STM to lats, R shoulder PROM Supine shoulder flexion RTB 2 x 10 Supine bilateral ER RTB 2 x 10 Supine bicep curl 1# 2x10 Supine skull crusher no weight 2x10 Prone triceps extension 0# x20 Prone row 6# 2x10 Flex bar (red) U/Ns and twists x30ea Standing bicep curl with eccentric lowering 2# x20 Hammer curl 2# 2x10 Press out 1# 2x10   PATIENT EDUCATION:  Education details: Exercise form, POC, HEP.  PT answered pt's questions. Person educated: Patient Education method: Explanation, Demonstration, verbal and tactile cues Education comprehension: verbalized understanding, returned demonstration, and needs further education, verbal and tactile cues required  HOME EXERCISE PROGRAM: Access Code: [email or web address] URL: [email or web address] Date: 11/22/2023 Prepared by: Gellen April Erman Hayward  Exercises - Supine Elbow Extension Stretch in Supination with tricep setting  - 1 x daily - 7 x weekly - 1 sets - 10 reps - Supine Elbow Extension Stretch in Pronation with tricep setting - 1 x daily - 7 x weekly - 1 sets - 10 reps - Supine Elbow Flexion Extension AROM  - 1 x daily - 7 x weekly - 1 sets - 10 reps  ASSESSMENT:  CLINICAL IMPRESSION:  Pt continues to have limitations in shoulder ROM and elbow extension.  He has improved overall with elbow extension ROM though is less than prior testing.  Pt has good elbow flexion ROM.  He demonstrates improved R shoulder abduction AROM and minimally improved flexion AROM.  Though he has improved with abduction, he is still limited.  Pt continues to report pain in R shoulder with certain movements.  Pt tolerated exercises and PROM well.  He is improving with elbow strength as evidenced by performance of exercises.  Pt partially met STG's #2,3.  Pt should benefit from cont skilled PT to address impairments including ROM and strength and to assist in restoring desired level of function.       OBJECTIVE IMPAIRMENTS: decreased coordination,  decreased mobility, decreased ROM, decreased strength, hypomobility, increased edema, increased fascial restrictions, increased muscle spasms, impaired flexibility, impaired UE functional use, improper body mechanics, postural dysfunction, and pain.   ACTIVITY LIMITATIONS: carrying, lifting, bathing, toileting, dressing, reach over head, and hygiene/grooming  PARTICIPATION LIMITATIONS: meal prep, cleaning, laundry, driving, shopping, community activity, and occupation  PERSONAL FACTORS: Age, Fitness, Past/current experiences, and Time since onset of injury/illness/exacerbation are also affecting patient's functional outcome.   REHAB POTENTIAL: Good  CLINICAL DECISION MAKING: Evolving/moderate complexity  EVALUATION COMPLEXITY: Moderate   GOALS: Goals reviewed with patient? Yes  SHORT TERM GOALS: Target date: 12/13/2023  Pt will be ind with initial HEP Baseline: Goal status: INITIAL  2.  Pt will have improved elbow ROM to 10-135 deg Baseline:  Goal status:  75% MET  5/27  3.  Pt will have improved shoulder flexion and abd by at least 10 deg Baseline:  Goal status:  50% MET  5/27   LONG TERM GOALS: Target date: 01/03/2024   Pt will be ind with management and progression of HEP Baseline:  Goal status: INITIAL  2.  Pt will have improved elbow ROM to at least 5-140  deg Baseline:  Goal status: INITIAL  3.  Pt will have full and pain free shoulder elevation for overhead tasks Baseline:  Goal status: INITIAL  4.  Pt will be able to tolerate lifting and carrying at least 50# for work tasks Baseline:  Goal status: INITIAL  5.  Pt will have R UE strength to at least 90% of L UE strength for work tasks Baseline:  Goal status: INITIAL  6.  Pt will demo MCID on his QuickD[employer] or UEFI Baseline:  Goal status: INITIAL   PLAN:  PT FREQUENCY: 2x/week  PT DURATION: 6 weeks  PLANNED INTERVENTIONS: 97164- PT Re-evaluation, 97110-Therapeutic exercises, 97530- Therapeutic  activity, [id number]- Neuromuscular re-education, 97535- Self Care, 16109- Manual therapy, [id number]- Electrical stimulation (unattended), [phone]- Ionotophoresis 4mg /ml Dexamethasone , Patient/Family education, Taping, Dry Needling, Joint mobilization, Cryotherapy, and Moist heat  PLAN FOR NEXT SESSION: Perform a patient survey for UE. Continue elbow/shoulder ROM/strengthening, manual/joint mobs.  PN next visit.  Pt has a FCE on 6/17.  Trina Fujita III PT, DPT 01/02/24 5:00 PM

## 2024-01-02 ENCOUNTER — Encounter (HOSPITAL_BASED_OUTPATIENT_CLINIC_OR_DEPARTMENT_OTHER): Payer: Self-pay | Admitting: Physical Therapy

## 2024-01-02 ENCOUNTER — Ambulatory Visit (HOSPITAL_BASED_OUTPATIENT_CLINIC_OR_DEPARTMENT_OTHER): Payer: Worker's Compensation | Admitting: Physical Therapy

## 2024-01-02 DIAGNOSIS — M25621 Stiffness of right elbow, not elsewhere classified: Secondary | ICD-10-CM

## 2024-01-02 DIAGNOSIS — M25521 Pain in right elbow: Secondary | ICD-10-CM

## 2024-01-02 DIAGNOSIS — M6281 Muscle weakness (generalized): Secondary | ICD-10-CM

## 2024-01-02 DIAGNOSIS — R600 Localized edema: Secondary | ICD-10-CM

## 2024-01-05 ENCOUNTER — Ambulatory Visit (HOSPITAL_BASED_OUTPATIENT_CLINIC_OR_DEPARTMENT_OTHER): Payer: Worker's Compensation | Admitting: Physical Therapy

## 2024-01-05 ENCOUNTER — Encounter (HOSPITAL_BASED_OUTPATIENT_CLINIC_OR_DEPARTMENT_OTHER): Payer: Self-pay | Admitting: Physical Therapy

## 2024-01-05 DIAGNOSIS — M25621 Stiffness of right elbow, not elsewhere classified: Secondary | ICD-10-CM | POA: Diagnosis not present

## 2024-01-05 DIAGNOSIS — M25521 Pain in right elbow: Secondary | ICD-10-CM

## 2024-01-05 DIAGNOSIS — M6281 Muscle weakness (generalized): Secondary | ICD-10-CM

## 2024-01-05 DIAGNOSIS — R600 Localized edema: Secondary | ICD-10-CM

## 2024-01-05 NOTE — Therapy (Signed)
 OUTPATIENT PHYSICAL THERAPY TREATMENT PROGRESS NOTE   Patient Name: Mark Miranda MRN: [id number] DOB:Dec 13, 1970, 53 y.o., male Today's Date: 01/05/2024  END OF SESSION:  PT End of Session - 01/05/24 0856     Visit Number 9    Number of Visits 17    Date for PT Re-Evaluation 02/09/24    Authorization Type Worker's Comp    PT Start Time 0856    PT Stop Time [phone]    PT Time Calculation (min) 46 min    Activity Tolerance Patient tolerated treatment well    Behavior During Therapy [facility] for tasks assessed/performed                   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   Hypercholesterolemia    Hypertension    Metabolic acidosis    Recurrent peritonitis (HCC)    Type 1 diabetes with renal manifestations, controlled (HCC)    Vitamin D  deficiency    Past Surgical History:  Procedure Laterality Date   AV FISTULA PLACEMENT  01/10/2012   Procedure: ARTERIOVENOUS (AV) FISTULA CREATION;  Surgeon: Richrd Char, MD;  Location: [facility] OR;  Service: Vascular;  Laterality: Left;  Left Brachial Cephalic Arteriovenous Fistula   CAPD INSERTION  01/09/2012   Procedure: CONTINUOUS AMBULATORY PERITONEAL DIALYSIS  (CAPD) CATHETER INSERTION;  Surgeon: Levert Ready, MD;  Location: [facility] OR;  Service: General;  Laterality: N/A;  Exteriorization PD Cath   CAPSULOTOMY Right 11/07/2023   Procedure: CAPSULOTOMY, ELBOW;  Surgeon: Wilhelmenia Harada, MD;  Location: [facility];  Service: Orthopedics;  Laterality: Right;  RIGHT ELBOW CAPSULAR RELEASE   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   received left kidney, 3 renal arteries, 1 renal vein, ureter and pancreas from 53  year old   HARDWARE REMOVAL Right 11/07/2023   Procedure: REMOVAL, HARDWARE;  Surgeon: Wilhelmenia Harada, MD;  Location: [facility];  Service: Orthopedics;  Laterality: Right;   INSERTION OF DIALYSIS CATHETER  01/13/2012   Procedure: INSERTION OF DIALYSIS CATHETER;  Surgeon: Mayo Speck, MD;  Location: [facility] OR;  Service: Vascular;  Laterality: Right;  Internal Jugular   LAPAROSCOPY  03/13/2012   Procedure: LAPAROSCOPY DIAGNOSTIC;  Surgeon: Eddye Goodie, MD;  Location: [facility] OR;  Service: General;  Laterality: N/A;   LIVER BIOPSY  04/02/2013   open   ORIF HUMERUS FRACTURE Right 03/07/2023   Procedure: OPEN REDUCTION INTERNAL FIXATION (ORIF) DISTAL HUMERUS FRACTURE;  Surgeon: Wilhelmenia Harada, MD;  Location: [facility] ORS;  Service: Orthopedics;  Laterality: Right;   PERITONEAL CATHETER INSERTION     PERITONEAL CATHETER REMOVAL     SMALL INTESTINE SURGERY  04/02/2013   TRICEPS TENDON REPAIR  03/07/2023   Procedure: TRICEPS TENDON REPAIR;  Surgeon: Wilhelmenia Harada, MD;  Location: [facility] ORS;  Service: Orthopedics;;   ULNAR NERVE TRANSPOSITION Right 03/07/2023   Procedure: ULNAR NERVE EXPLORATION;  Surgeon: Wilhelmenia Harada, MD;  Location: [facility] ORS;  Service: Orthopedics;  Laterality: Right;   Patient Active Problem List   Diagnosis Date Noted   Hyperlipidemia LDL goal <70 11/13/2023   Closed fracture of right distal humerus 03/07/2023   Closed bicondylar fracture of distal end of right  humerus 03/04/2023   Type 2 diabetes mellitus with diabetic chronic kidney disease (HCC) 07/22/2022   Depression, major, 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/2022   Erectile dysfunction 07/24/2017   Vitamin D  deficiency, unspecified 07/24/2017   Low back pain 02/25/2016   Chronic fatigue 01/25/2016   Diabetic peripheral neuropathy (HCC) 11/24/2015   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   Depression 11/02/2015   Tobacco abuse 11/02/2015   End stage renal disease (HCC)  12/29/2011   HTN (hypertension) 01/25/2011    PCP: [name], [name], MD  REFERRING PROVIDER: Wilhelmenia Harada, MD  REFERRING DIAG: [phone] (ICD-10-CM) - Other closed displaced fracture of distal end of right humerus, initial encounter  THERAPY DIAG:  Stiffness of right elbow, not elsewhere classified  Pain in right elbow  Muscle weakness (generalized)  Localized edema  Rationale for Evaluation and Treatment: Rehabilitation  ONSET DATE: 11/07/23 R distal humeral elbow hardware removal  SUBJECTIVE:   SUBJECTIVE STATEMENT: Pt is 8 weeks and 3 days s/p Right distal humeral removal of hardware and elbow ulnar nerve neurolysis.  Pt denies any adverse effects after prior Rx.  Pt reports ongoing shoulder pain with certain movements.  Pt is having a FCE on 6/17.    "I'm better in general, mostly everything."  Pt states he knows he's not 100%.  Pt reports improved dressing and bathing.  Pt has pain with fully extending elbow and trying to carry something.  Pt reports pain and limitations with UE elevation.     PERTINENT HISTORY: Per 11/16/23: Status post right distal humeral elbow hardware removal.  At this time he is progressing with range of motion.  I would like him to work with physical therapy to progress his flexion and extension.   PAIN:  Are you having pain? Yes: NPRS scale: 2/10 in shoulder, 0/10 in elbow at rest, 3/10 elbow with fully extending elbow Pain location: see above Pain description: sharp Aggravating factors: Sleeping, leaning Relieving factors: massage   PRECAUTIONS: None  RED FLAGS: None   WEIGHT BEARING RESTRICTIONS: No  FALLS:  Has patient fallen in last 6 months? 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  Pt wants full strength of his arm and perform act's without pain.  NEXT MD VISIT: 11/23/23 for suture removal  OBJECTIVE:  Note: Objective measures were completed at Evaluation unless otherwise noted.  DIAGNOSTIC FINDINGS: R elbow x-ray 10/29/23 IMPRESSION: 1. Status post ORIF of the distal humerus without evidence of hardware complication. 2. Several calcific densities along the palmar aspect of the elbow joint may reflect loose bodies.   COGNITION: Overall cognitive status: Within functional limits for tasks assessed      MUSCLE LENGTH: Biceps: ROM below    UPPER EXTREMITY ROM:  Active ROM (measured in supine) Right eval Left eval Right 5/16 Right 5/27  Left 5/27 Right 5/30  Shoulder flexion 145*   148 with pain    Shoulder extension        Shoulder abduction 85*   118 with pain 157   Shoulder adduction        Shoulder extension        Shoulder internal rotation 80       Shoulder  external rotation 75       Elbow flexion 115 145 143 144    Elbow extension -20 3 -12/-10 AROM/PROM -15/-11 AROM/PROM  -10 AROM  Wrist flexion        Wrist extension        Wrist ulnar deviation        Wrist radial deviation        Wrist pronation      WFL bilat  Wrist supination      WFL bilat   (Blank rows = not tested, * = pain)  UPPER EXTREMITY MMT:  MMT Right eval Left eval Right 5/30 Comparison 5/30  Shoulder flexion 4     Shoulder extension      Shoulder abduction 4-     Shoulder adduction      Shoulder extension      Shoulder internal rotation 4-     Shoulder external rotation 3+     Middle trapezius      Lower trapezius      Elbow flexion (90 deg) 24.7, 23.6 38.3, 40.7 29.9, 29.5 R  73% of L  Elbow extension (90 deg) 12.9, 9.2 21.9, 28.3 19.8 R 70% of L  Wrist flexion      Wrist extension      Wrist ulnar deviation      Wrist radial deviation      Wrist pronation   5/5   Wrist supination   5/5 with shoulder pain   Grip strength 35 lbs  55 lbs 49 lbs    (Blank rows = not tested)                                                                                                                                TREATMENT DATE:   01/04/2024 Reviewed current function, pain levels, HEP compliance, and response to prior Rx.  UEFI:  43/80 PT assessed ROM and strength.  See above  UBE x 5 min alternating fwd/bwd Pt received R elbow flexion and extension PROM per pt and tissue tolerance Flex bar (red) U/Ns and twists x30ea Standing elbow extension with YTB 2x10  01/02/2024 PT assessed shoulder and elbow ROM.  See above  UBE 4 min- alternating fwd/back each minute Pt received R elbow flex and extension PROM and forearm pronation and supination PROM in supine per pt and tissue tolerance. Elbow flexion 2#  x10, 4# x10 Hammer curl 1# x10, 4# x10 Flex bar (red) U/Ns and twists x30ea Standing shoulder ext red TB 2x10     12/29/2023 Pt received R elbow flex and extension PROM and forearm pronation and supination PROM in supine per pt and tissue tolerance. STM to supinator m, pec, lat, periscap mm Elbow flexion 1#  x10, 4# x10 Hammer curl 1# x10, 4# x10 Seated press out 3# x15 Prone triceps extension 0# x30 Flex bar (red) U/Ns and twists x30ea Supine shoulder ABC x 1 rep UBE 4 min- alternating  fwd/back each minute   12/26/2023 Pulleys x 20 reps in flexion and abduction Pt received R elbow flex and extension PROM and forearm pronation and supination PROM in supine per pt and tissue tolerance. Elbow flexion 1#  x15, 2# x10 Hammer curl 1# x10, 2# x10 Prone triceps extension 0# x20 Flex bar (red) U/Ns and twists x30ea Supine shoulder ABC x 1 rep Standing bilat ER with RTB approx 6 reps S/L ER x 10 Standing shoulder ext red TB 3x10 Standing row with RTB 2x10    PATIENT EDUCATION:  Education details: Exercise form, POC, HEP.  PT answered pt's questions. Person educated: Patient Education method: Explanation,  Demonstration, verbal and tactile cues Education comprehension: verbalized understanding, returned demonstration, and needs further education, verbal and tactile cues required  HOME EXERCISE PROGRAM: Access Code: [email or web address] URL: [email or web address] Date: 11/22/2023 Prepared by: Gellen April Erman Hayward  Exercises - Supine Elbow Extension Stretch in Supination with tricep setting  - 1 x daily - 7 x weekly - 1 sets - 10 reps - Supine Elbow Extension Stretch in Pronation with tricep setting - 1 x daily - 7 x weekly - 1 sets - 10 reps - Supine Elbow Flexion Extension AROM  - 1 x daily - 7 x weekly - 1 sets - 10 reps  ASSESSMENT:  CLINICAL IMPRESSION:  Pt is improving as evidenced by subjective reports though continues to have elbow pain with extending elbow.  Pt has improved with functional usage of UE.  Pt has improved extension since having surgery though states he continues to have pain with fully extending elbow.  He also has pain with trying to carry something.  Pt has also been complaining of shoulder pain.  Pt reports pain and limitations with UE elevation.  He has pain with end range extension and has improved with elbow extension ROM.  Though he doesn't have full extension, he demonstrates improvement.  Pt has good elbow flexion and forearm pronation/supination AROM.  Pt demonstrates improved strength t/o elbow and gripping.  Pt's R biceps/triceps strength is 73%/70% of L UE with HHD testing at 90 deg flexion.  Pt met STG #2 and partially met STG #3 and LTG #2.  He responded well to Rx reporting a little increase in pain after Rx.  He should benefit from cont skilled PT to improve strength and ROM, address ongoing goals, and to assist in restoring desired level of function.    OBJECTIVE IMPAIRMENTS: decreased coordination, decreased mobility, decreased ROM, decreased strength, hypomobility, increased edema, increased fascial restrictions, increased muscle spasms, impaired  flexibility, impaired UE functional use, improper body mechanics, postural dysfunction, and pain.   ACTIVITY LIMITATIONS: carrying, lifting, bathing, toileting, dressing, reach over head, and hygiene/grooming  PARTICIPATION LIMITATIONS: meal prep, cleaning, laundry, driving, shopping, community activity, and occupation  PERSONAL FACTORS: Age, Fitness, Past/current experiences, and Time since onset of injury/illness/exacerbation are also affecting patient's functional outcome.   REHAB POTENTIAL: Good  CLINICAL DECISION MAKING: Evolving/moderate complexity  EVALUATION COMPLEXITY: Moderate   GOALS: Goals reviewed with patient? Yes  SHORT TERM GOALS: Target date: 12/13/2023  Pt will be ind with initial HEP Baseline: Goal status: INITIAL  2.  Pt will have improved elbow ROM to 10-135 deg Baseline:  Goal status:  GOAL MET  5/30  3.  Pt will have improved shoulder flexion and abd by at least 10 deg Baseline:  Goal status:  50% MET  5/27   LONG TERM GOALS: Target date: 02/09/2024   Pt will be ind with management  and progression of HEP Baseline:  Goal status: PROGRESSING  2.  Pt will have improved elbow ROM to at least 5-140 deg Baseline:  Goal status: 60%  MET  5/30  3.  Pt will have full and pain free shoulder elevation for overhead tasks Baseline:  Goal status: NOT MET  4.  Pt will be able to tolerate lifting and carrying at least 50# for work tasks Baseline:  Goal status: INITIAL  5.  Pt will have R UE strength to at least 90% of L UE strength for work tasks Baseline:  Goal status: PROGRESSING  5/30  6.  Pt will demo MCID on his [employer] or UEFI Baseline:  Goal status: INITIAL   PLAN:  PT FREQUENCY: 2x/week  PT DURATION: 5 weeks  PLANNED INTERVENTIONS: 97164- PT Re-evaluation, 97110-Therapeutic exercises, 97530- Therapeutic activity, 97112- Neuromuscular re-education, 97535- Self Care, 16109- Manual therapy, [id number]- Electrical stimulation (unattended), 97033-  Ionotophoresis 4mg /ml Dexamethasone , Patient/Family education, Taping, Dry Needling, Joint mobilization, Cryotherapy, and Moist heat  PLAN FOR NEXT SESSION:  Continue elbow/shoulder ROM/strengthening, manual/joint mobs.  Pt has a FCE on 6/17.  Trina Fujita III PT, DPT 01/05/24 11:39 PM

## 2024-01-08 ENCOUNTER — Encounter (HOSPITAL_BASED_OUTPATIENT_CLINIC_OR_DEPARTMENT_OTHER): Payer: Self-pay

## 2024-01-08 ENCOUNTER — Ambulatory Visit (HOSPITAL_BASED_OUTPATIENT_CLINIC_OR_DEPARTMENT_OTHER): Payer: Worker's Compensation | Attending: Orthopaedic Surgery

## 2024-01-08 DIAGNOSIS — R600 Localized edema: Secondary | ICD-10-CM | POA: Diagnosis present

## 2024-01-08 DIAGNOSIS — M6281 Muscle weakness (generalized): Secondary | ICD-10-CM | POA: Diagnosis present

## 2024-01-08 DIAGNOSIS — S42491A Other displaced fracture of lower end of right humerus, initial encounter for closed fracture: Secondary | ICD-10-CM | POA: Diagnosis present

## 2024-01-08 DIAGNOSIS — M25521 Pain in right elbow: Secondary | ICD-10-CM | POA: Diagnosis present

## 2024-01-08 DIAGNOSIS — M25621 Stiffness of right elbow, not elsewhere classified: Secondary | ICD-10-CM | POA: Diagnosis present

## 2024-01-08 NOTE — Therapy (Signed)
 OUTPATIENT PHYSICAL THERAPY TREATMENT PROGRESS NOTE   Patient Name: Mark Miranda MRN: [id number] DOB:Jun 17, 1971, 53 y.o., male Today's Date: 01/08/2024  END OF SESSION:  PT End of Session - 01/08/24 0805     Visit Number 10    Number of Visits 17    Date for PT Re-Evaluation 02/09/24    Authorization Type Worker's Comp    PT Start Time 0801    PT Stop Time 0845    PT Time Calculation (min) 44 min    Activity Tolerance Patient tolerated treatment well    Behavior During Therapy [facility] for tasks assessed/performed                    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   Hypercholesterolemia    Hypertension    Metabolic acidosis    Recurrent peritonitis (HCC)    Type 1 diabetes with renal manifestations, controlled (HCC)    Vitamin D  deficiency    Past Surgical History:  Procedure Laterality Date   AV FISTULA PLACEMENT  01/10/2012   Procedure: ARTERIOVENOUS (AV) FISTULA CREATION;  Surgeon: Richrd Char, MD;  Location: [facility] OR;  Service: Vascular;  Laterality: Left;  Left Brachial Cephalic Arteriovenous Fistula   CAPD INSERTION  01/09/2012   Procedure: CONTINUOUS AMBULATORY PERITONEAL DIALYSIS  (CAPD) CATHETER INSERTION;  Surgeon: Levert Ready, MD;  Location: [facility] OR;  Service: General;  Laterality: N/A;  Exteriorization PD Cath   CAPSULOTOMY Right 11/07/2023   Procedure: CAPSULOTOMY, ELBOW;  Surgeon: Wilhelmenia Harada, MD;  Location: [facility];  Service: Orthopedics;  Laterality: Right;  RIGHT ELBOW CAPSULAR RELEASE   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   received left kidney, 3 renal arteries, 1 renal vein, ureter and pancreas from 53  year old   HARDWARE REMOVAL Right 11/07/2023   Procedure: REMOVAL, HARDWARE;  Surgeon: Wilhelmenia Harada, MD;  Location: [facility];  Service: Orthopedics;  Laterality: Right;   INSERTION OF DIALYSIS CATHETER  01/13/2012   Procedure: INSERTION OF DIALYSIS CATHETER;  Surgeon: Mayo Speck, MD;  Location: [facility] OR;  Service: Vascular;  Laterality: Right;  Internal Jugular   LAPAROSCOPY  03/13/2012   Procedure: LAPAROSCOPY DIAGNOSTIC;  Surgeon: Eddye Goodie, MD;  Location: [facility] OR;  Service: General;  Laterality: N/A;   LIVER BIOPSY  04/02/2013   open   ORIF HUMERUS FRACTURE Right 03/07/2023   Procedure: OPEN REDUCTION INTERNAL FIXATION (ORIF) DISTAL HUMERUS FRACTURE;  Surgeon: Wilhelmenia Harada, MD;  Location: [facility] ORS;  Service: Orthopedics;  Laterality: Right;   PERITONEAL CATHETER INSERTION     PERITONEAL CATHETER REMOVAL     SMALL INTESTINE SURGERY  04/02/2013   TRICEPS TENDON REPAIR  03/07/2023   Procedure: TRICEPS TENDON REPAIR;  Surgeon: Wilhelmenia Harada, MD;  Location: [facility] ORS;  Service: Orthopedics;;   ULNAR NERVE TRANSPOSITION Right 03/07/2023   Procedure: ULNAR NERVE EXPLORATION;  Surgeon: Wilhelmenia Harada, MD;  Location: [facility] ORS;  Service: Orthopedics;  Laterality: Right;   Patient Active Problem List   Diagnosis Date Noted   Hyperlipidemia LDL goal <70 11/13/2023   Closed fracture of right distal humerus 03/07/2023   Closed bicondylar fracture of distal end of  right humerus 03/04/2023   Type 2 diabetes mellitus with diabetic chronic kidney disease (HCC) 07/22/2022   Depression, major, 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/2022   Erectile dysfunction 07/24/2017   Vitamin D  deficiency, unspecified 07/24/2017   Low back pain 02/25/2016   Chronic fatigue 01/25/2016   Diabetic peripheral neuropathy (HCC) 11/24/2015   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   Depression 11/02/2015   Tobacco abuse 11/02/2015   End stage renal disease (HCC)  12/29/2011   HTN (hypertension) 01/25/2011    PCP: [name], [name], MD  REFERRING PROVIDER: Wilhelmenia Harada, MD  REFERRING DIAG: [id number] (ICD-10-CM) - Other closed displaced fracture of distal end of right humerus, initial encounter  THERAPY DIAG:  Stiffness of right elbow, not elsewhere classified  Pain in right elbow  Muscle weakness (generalized)  Localized edema  Rationale for Evaluation and Treatment: Rehabilitation  ONSET DATE: 11/07/23 R distal humeral elbow hardware removal  SUBJECTIVE:   SUBJECTIVE STATEMENT: Pt reports his shoulder continues to bother him again. He reports some pain with elbow extension .    PERTINENT HISTORY: Per 11/16/23: Status post right distal humeral elbow hardware removal.  At this time he is progressing with range of motion.  I would like him to work with physical therapy to progress his flexion and extension.   PAIN:  Are you having pain? Yes: NPRS scale: 2/10 in shoulder, 0/10 in elbow at rest, 3/10 elbow with fully extending elbow Pain location: see above Pain description: sharp Aggravating factors: Sleeping, leaning Relieving factors: massage   PRECAUTIONS: None  RED FLAGS: None   WEIGHT BEARING RESTRICTIONS: No  FALLS:  Has patient fallen in last 6 months? 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  Pt wants full strength of his arm and perform act's without pain.  NEXT MD VISIT: 11/23/23 for suture removal  OBJECTIVE:  Note: Objective measures were completed at Evaluation unless otherwise noted.  DIAGNOSTIC FINDINGS: R elbow x-ray 10/29/23 IMPRESSION: 1. Status post ORIF of the distal humerus without evidence of hardware complication. 2. Several  calcific densities along the palmar aspect of the elbow joint may reflect loose bodies.   COGNITION: Overall cognitive status: Within functional limits for tasks assessed      MUSCLE LENGTH: Biceps: ROM below    UPPER EXTREMITY ROM:  Active ROM (measured in supine) Right eval Left eval Right 5/16 Right 5/27  Left 5/27 Right 5/30  Shoulder flexion 145*   148 with pain    Shoulder extension        Shoulder abduction 85*   118 with pain 157   Shoulder adduction        Shoulder extension        Shoulder internal rotation 80       Shoulder external rotation 75       Elbow flexion 115 145 143 144    Elbow extension -20 3 -12/-10 AROM/PROM -15/-11 AROM/PROM  -10 AROM  Wrist flexion        Wrist extension        Wrist ulnar deviation        Wrist radial deviation        Wrist pronation  WFL bilat  Wrist supination      WFL bilat   (Blank rows = not tested, * = pain)  UPPER EXTREMITY MMT:  MMT Right eval Left eval Right 5/30 Comparison 5/30  Shoulder flexion 4     Shoulder extension      Shoulder abduction 4-     Shoulder adduction      Shoulder extension      Shoulder internal rotation 4-     Shoulder external rotation 3+     Middle trapezius      Lower trapezius      Elbow flexion (90 deg) 24.7, 23.6 38.3, 40.7 29.9, 29.5 R  73% of L  Elbow extension (90 deg) 12.9, 9.2 21.9, 28.3 19.8 R 70% of L  Wrist flexion      Wrist extension      Wrist ulnar deviation      Wrist radial deviation      Wrist pronation   5/5   Wrist supination   5/5 with shoulder pain   Grip strength 35 lbs 55 lbs 49 lbs    (Blank rows = not tested)                                                                                                                                TREATMENT DATE:   6/2 Pt received R elbow flex and extension PROM and forearm pronation and supination PROM in supine per pt and tissue tolerance. STM to biceps/ant deltoid Elbow flexion 5# 3x10 Hammer  curl 5# 3x10 Plinth push ups x10 Seated press out 3# x15 Prone triceps extension 0# x15, 2# x30 Theraband row Blue TB x30 Theraband shoulder extension GTB x30 Theraband triceps extension GTB x30 Pulleys    Flex bar (red) U/Ns and twists x30ea UBE 4 min- alternating fwd/back each minute   01/04/2024 Reviewed current function, pain levels, HEP compliance, and response to prior Rx.  UEFI:  43/80 PT assessed ROM and strength.  See above  UBE x 5 min alternating fwd/bwd Pt received R elbow flexion and extension PROM per pt and tissue tolerance Flex bar (red) U/Ns and twists x30ea Standing elbow extension with YTB 2x10  01/02/2024 PT assessed shoulder and elbow ROM.  See above  UBE 4 min- alternating fwd/back each minute Pt received R elbow flex and extension PROM and forearm pronation and supination PROM in supine per pt and tissue tolerance. Elbow flexion 2#  x10, 4# x10 Hammer curl 1# x10, 4# x10 Flex bar (red) U/Ns and twists x30ea Standing shoulder ext red TB 2x10     12/29/2023 Pt received R elbow flex and extension PROM and forearm pronation and supination PROM in supine per pt and tissue tolerance. STM to supinator m, pec, lat, periscap mm Elbow flexion 1#  x10, 4# x10 Hammer curl 1# x10, 4# x10 Seated press out 3# x15 Prone triceps extension 0# x30 Flex bar (red) U/Ns and twists  x30ea Supine shoulder ABC x 1 rep UBE 4 min- alternating fwd/back each minute   12/26/2023 Pulleys x 20 reps in flexion and abduction Pt received R elbow flex and extension PROM and forearm pronation and supination PROM in supine per pt and tissue tolerance. Elbow flexion 1#  x15, 2# x10 Hammer curl 1# x10, 2# x10 Prone triceps extension 0# x20 Flex bar (red) U/Ns and twists x30ea Supine shoulder ABC x 1 rep Standing bilat ER with RTB approx 6 reps S/L ER x 10 Standing shoulder ext red TB 3x10 Standing row with RTB 2x10    PATIENT EDUCATION:  Education details: Exercise  form, POC, HEP.  PT answered pt's questions. Person educated: Patient Education method: Explanation, Demonstration, verbal and tactile cues Education comprehension: verbalized understanding, returned demonstration, and needs further education, verbal and tactile cues required  HOME EXERCISE PROGRAM: Access Code: [email or web address] URL: [email or web address] Date: 11/22/2023 Prepared by: Gellen April Erman Hayward  Exercises - Supine Elbow Extension Stretch in Supination with tricep setting  - 1 x daily - 7 x weekly - 1 sets - 10 reps - Supine Elbow Extension Stretch in Pronation with tricep setting - 1 x daily - 7 x weekly - 1 sets - 10 reps - Supine Elbow Flexion Extension AROM  - 1 x daily - 7 x weekly - 1 sets - 10 reps  ASSESSMENT:  CLINICAL IMPRESSION:  Pt tender and tight into biceps and anterior deltoid. Instructed pt in self STM/stretching for this area. He reports performing partial push ups at home on bed, though this does remain challenging. Pt denied difficulty with open chain tasks. Unable to complete skull crusher movement due to shoulder discomfort. Will continue to progress as tolerated and monitor R shoulder discomfort.    OBJECTIVE IMPAIRMENTS: decreased coordination, decreased mobility, decreased ROM, decreased strength, hypomobility, increased edema, increased fascial restrictions, increased muscle spasms, impaired flexibility, impaired UE functional use, improper body mechanics, postural dysfunction, and pain.   ACTIVITY LIMITATIONS: carrying, lifting, bathing, toileting, dressing, reach over head, and hygiene/grooming  PARTICIPATION LIMITATIONS: meal prep, cleaning, laundry, driving, shopping, community activity, and occupation  PERSONAL FACTORS: Age, Fitness, Past/current experiences, and Time since onset of injury/illness/exacerbation are also affecting patient's functional outcome.   REHAB POTENTIAL: Good  CLINICAL DECISION MAKING: Evolving/moderate  complexity  EVALUATION COMPLEXITY: Moderate   GOALS: Goals reviewed with patient? Yes  SHORT TERM GOALS: Target date: 12/13/2023  Pt will be ind with initial HEP Baseline: Goal status: INITIAL  2.  Pt will have improved elbow ROM to 10-135 deg Baseline:  Goal status:  GOAL MET  5/30  3.  Pt will have improved shoulder flexion and abd by at least 10 deg Baseline:  Goal status:  50% MET  5/27   LONG TERM GOALS: Target date: 02/09/2024   Pt will be ind with management and progression of HEP Baseline:  Goal status: PROGRESSING  2.  Pt will have improved elbow ROM to at least 5-140 deg Baseline:  Goal status: 60%  MET  5/30  3.  Pt will have full and pain free shoulder elevation for overhead tasks Baseline:  Goal status: NOT MET  4.  Pt will be able to tolerate lifting and carrying at least 50# for work tasks Baseline:  Goal status: INITIAL  5.  Pt will have R UE strength to at least 90% of L UE strength for work tasks Baseline:  Goal status: PROGRESSING  5/30  6.  Pt will demo MCID on his [employer] or UEFI  Baseline:  Goal status: INITIAL   PLAN:  PT FREQUENCY: 2x/week  PT DURATION: 5 weeks  PLANNED INTERVENTIONS: 97164- PT Re-evaluation, 97110-Therapeutic exercises, 97530- Therapeutic activity, [id number]- Neuromuscular re-education, 97535- Self Care, [id number]- Manual therapy, [id number]- Electrical stimulation (unattended), [phone]- Ionotophoresis 4mg /ml Dexamethasone , Patient/Family education, Taping, Dry Needling, Joint mobilization, Cryotherapy, and Moist heat  PLAN FOR NEXT SESSION:  Continue elbow/shoulder ROM/strengthening, manual/joint mobs.  Pt has a FCE on 6/17.  Herb Loges, PTA  01/08/24 8:54 AM

## 2024-01-11 ENCOUNTER — Ambulatory Visit (HOSPITAL_BASED_OUTPATIENT_CLINIC_OR_DEPARTMENT_OTHER): Payer: Worker's Compensation

## 2024-01-11 ENCOUNTER — Encounter (HOSPITAL_BASED_OUTPATIENT_CLINIC_OR_DEPARTMENT_OTHER): Payer: Self-pay

## 2024-01-11 DIAGNOSIS — M25621 Stiffness of right elbow, not elsewhere classified: Secondary | ICD-10-CM | POA: Diagnosis not present

## 2024-01-11 DIAGNOSIS — M6281 Muscle weakness (generalized): Secondary | ICD-10-CM

## 2024-01-11 DIAGNOSIS — M25521 Pain in right elbow: Secondary | ICD-10-CM

## 2024-01-11 DIAGNOSIS — R600 Localized edema: Secondary | ICD-10-CM

## 2024-01-11 NOTE — Therapy (Signed)
 OUTPATIENT PHYSICAL THERAPY TREATMENT PROGRESS NOTE   Patient Name: Mark Miranda MRN: [id number] DOB:04/11/1971, 53 y.o., male Today's Date: 01/11/2024  END OF SESSION:  PT End of Session - 01/11/24 1307     Visit Number 11    Number of Visits 17    Date for PT Re-Evaluation 02/09/24    Authorization Type Worker's Comp    PT Start Time 1303    PT Stop Time 1345    PT Time Calculation (min) 42 min    Activity Tolerance Patient tolerated treatment well    Behavior During Therapy WFL for tasks assessed/performed                     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   Hypercholesterolemia    Hypertension    Metabolic acidosis    Recurrent peritonitis (HCC)    Type 1 diabetes with renal manifestations, controlled (HCC)    Vitamin D  deficiency    Past Surgical History:  Procedure Laterality Date   AV FISTULA PLACEMENT  01/10/2012   Procedure: ARTERIOVENOUS (AV) FISTULA CREATION;  Surgeon: Richrd Char, MD;  Location: [facility] OR;  Service: Vascular;  Laterality: Left;  Left Brachial Cephalic Arteriovenous Fistula   CAPD INSERTION  01/09/2012   Procedure: CONTINUOUS AMBULATORY PERITONEAL DIALYSIS  (CAPD) CATHETER INSERTION;  Surgeon: Levert Ready, MD;  Location: [facility] OR;  Service: General;  Laterality: N/A;  Exteriorization PD Cath   CAPSULOTOMY Right 11/07/2023   Procedure: CAPSULOTOMY, ELBOW;  Surgeon: Wilhelmenia Harada, MD;  Location: [facility];  Service: Orthopedics;  Laterality: Right;  RIGHT ELBOW CAPSULAR RELEASE   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   received left kidney, 3 renal arteries, 1 renal vein, ureter and pancreas from  53 year old   HARDWARE REMOVAL Right 11/07/2023   Procedure: REMOVAL, HARDWARE;  Surgeon: Wilhelmenia Harada, MD;  Location: [facility];  Service: Orthopedics;  Laterality: Right;   INSERTION OF DIALYSIS CATHETER  01/13/2012   Procedure: INSERTION OF DIALYSIS CATHETER;  Surgeon: Mayo Speck, MD;  Location: [facility] OR;  Service: Vascular;  Laterality: Right;  Internal Jugular   LAPAROSCOPY  03/13/2012   Procedure: LAPAROSCOPY DIAGNOSTIC;  Surgeon: Eddye Goodie, MD;  Location: [facility] OR;  Service: General;  Laterality: N/A;   LIVER BIOPSY  04/02/2013   open   ORIF HUMERUS FRACTURE Right 03/07/2023   Procedure: OPEN REDUCTION INTERNAL FIXATION (ORIF) DISTAL HUMERUS FRACTURE;  Surgeon: Wilhelmenia Harada, MD;  Location: [facility] ORS;  Service: Orthopedics;  Laterality: Right;   PERITONEAL CATHETER INSERTION     PERITONEAL CATHETER REMOVAL     SMALL INTESTINE SURGERY  04/02/2013   TRICEPS TENDON REPAIR  03/07/2023   Procedure: TRICEPS TENDON REPAIR;  Surgeon: Wilhelmenia Harada, MD;  Location: [facility] ORS;  Service: Orthopedics;;   ULNAR NERVE TRANSPOSITION Right 03/07/2023   Procedure: ULNAR NERVE EXPLORATION;  Surgeon: Wilhelmenia Harada, MD;  Location: [facility] ORS;  Service: Orthopedics;  Laterality: Right;   Patient Active Problem List   Diagnosis Date Noted   Hyperlipidemia LDL goal <70 11/13/2023   Closed fracture of right distal humerus 03/07/2023   Closed bicondylar fracture of distal end  of right humerus 03/04/2023   Type 2 diabetes mellitus with diabetic chronic kidney disease (HCC) 07/22/2022   Depression, major, 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/2022   Erectile dysfunction 07/24/2017   Vitamin D  deficiency, unspecified 07/24/2017   Low back pain 02/25/2016   Chronic fatigue 01/25/2016   Diabetic peripheral neuropathy (HCC) 11/24/2015   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   Depression 11/02/2015   Tobacco abuse 11/02/2015   End stage renal disease (HCC)  12/29/2011   HTN (hypertension) 01/25/2011    PCP: [name], [name], MD  REFERRING PROVIDER: Wilhelmenia Harada, MD  REFERRING DIAG: [phone] (ICD-10-CM) - Other closed displaced fracture of distal end of right humerus, initial encounter  THERAPY DIAG:  Stiffness of right elbow, not elsewhere classified  Muscle weakness (generalized)  Pain in right elbow  Localized edema  Rationale for Evaluation and Treatment: Rehabilitation  ONSET DATE: 11/07/23 R distal humeral elbow hardware removal  SUBJECTIVE:   SUBJECTIVE STATEMENT: Pt reports stiffness in elbow.     PERTINENT HISTORY: Per 11/16/23: Status post right distal humeral elbow hardware removal.  At this time he is progressing with range of motion.  I would like him to work with physical therapy to progress his flexion and extension.   PAIN:  Are you having pain? Yes: NPRS scale: 2/10 in shoulder, 0/10 in elbow at rest, 3/10 elbow with fully extending elbow Pain location: see above Pain description: sharp Aggravating factors: Sleeping, leaning Relieving factors: massage   PRECAUTIONS: None  RED FLAGS: None   WEIGHT BEARING RESTRICTIONS: No  FALLS:  Has patient fallen in last 6 months? 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  Pt wants full strength of his arm and perform act's without pain.  NEXT MD VISIT: 11/23/23 for suture removal  OBJECTIVE:  Note: Objective measures were completed at Evaluation unless otherwise noted.  DIAGNOSTIC FINDINGS: R elbow x-ray 10/29/23 IMPRESSION: 1. Status post ORIF of the distal humerus without evidence of hardware complication. 2. Several calcific densities along the palmar aspect of the elbow joint may  reflect loose bodies.   COGNITION: Overall cognitive status: Within functional limits for tasks assessed      MUSCLE LENGTH: Biceps: ROM below    UPPER EXTREMITY ROM:  Active ROM (measured in supine) Right eval Left eval Right 5/16 Right 5/27  Left 5/27 Right 5/30  Shoulder flexion 145*   148 with pain    Shoulder extension        Shoulder abduction 85*   118 with pain 157   Shoulder adduction        Shoulder extension        Shoulder internal rotation 80       Shoulder external rotation 75       Elbow flexion 115 145 143 144    Elbow extension -20 3 -12/-10 AROM/PROM -15/-11 AROM/PROM  -10 AROM  Wrist flexion        Wrist extension        Wrist ulnar deviation        Wrist radial deviation        Wrist pronation      WFL bilat  Wrist supination  WFL bilat   (Blank rows = not tested, * = pain)  UPPER EXTREMITY MMT:  MMT Right eval Left eval Right 5/30 Comparison 5/30  Shoulder flexion 4     Shoulder extension      Shoulder abduction 4-     Shoulder adduction      Shoulder extension      Shoulder internal rotation 4-     Shoulder external rotation 3+     Middle trapezius      Lower trapezius      Elbow flexion (90 deg) 24.7, 23.6 38.3, 40.7 29.9, 29.5 R  73% of L  Elbow extension (90 deg) 12.9, 9.2 21.9, 28.3 19.8 R 70% of L  Wrist flexion      Wrist extension      Wrist ulnar deviation      Wrist radial deviation      Wrist pronation   5/5   Wrist supination   5/5 with shoulder pain   Grip strength 35 lbs 55 lbs 49 lbs    (Blank rows = not tested)                                                                                                                                TREATMENT DATE:   6/5  Elbow flexion 5# x30 Hammer curl 5# x30 Seated press out 3# 3x10 Prone triceps extension 3# x5 (decreased to 2#) x20 Prone row 3# 3x10 Theraband row Blue TB x30 Theraband shoulder extension GTB x30 Theraband triceps extension GTB x30 Pulleys  abd Pronation/supination with 3lb weight on roller x20ea Wrist flex/ext 5# dumbbell x30ea UBE 4 min- alternating fwd/back each minute   6/2 Pt received R elbow flex and extension PROM and forearm pronation and supination PROM in supine per pt and tissue tolerance. STM to biceps/ant deltoid Elbow flexion 5# 3x10 Hammer curl 5# 3x10 Plinth push ups x10 Seated press out 3# x15 Prone triceps extension 0# x15, 2# x30 Theraband row Blue TB x30 Theraband shoulder extension GTB x30 Theraband triceps extension GTB x30 Pulleys    Flex bar (red) U/Ns and twists x30ea UBE 4 min- alternating fwd/back each minute   01/04/2024 Reviewed current function, pain levels, HEP compliance, and response to prior Rx.  UEFI:  43/80 PT assessed ROM and strength.  See above  UBE x 5 min alternating fwd/bwd Pt received R elbow flexion and extension PROM per pt and tissue tolerance Flex bar (red) U/Ns and twists x30ea Standing elbow extension with YTB 2x10  01/02/2024 PT assessed shoulder and elbow ROM.  See above  UBE 4 min- alternating fwd/back each minute Pt received R elbow flex and extension PROM and forearm pronation and supination PROM in supine per pt and tissue tolerance. Elbow flexion 2#  x10, 4# x10 Hammer curl 1# x10, 4# x10 Flex bar (red) U/Ns and twists x30ea Standing shoulder ext red TB 2x10     12/29/2023 Pt  received R elbow flex and extension PROM and forearm pronation and supination PROM in supine per pt and tissue tolerance. STM to supinator m, pec, lat, periscap mm Elbow flexion 1#  x10, 4# x10 Hammer curl 1# x10, 4# x10 Seated press out 3# x15 Prone triceps extension 0# x30 Flex bar (red) U/Ns and twists x30ea Supine shoulder ABC x 1 rep UBE 4 min- alternating fwd/back each minute   12/26/2023 Pulleys x 20 reps in flexion and abduction Pt received R elbow flex and extension PROM and forearm pronation and supination PROM in supine per pt and tissue  tolerance. Elbow flexion 1#  x15, 2# x10 Hammer curl 1# x10, 2# x10 Prone triceps extension 0# x20 Flex bar (red) U/Ns and twists x30ea Supine shoulder ABC x 1 rep Standing bilat ER with RTB approx 6 reps S/L ER x 10 Standing shoulder ext red TB 3x10 Standing row with RTB 2x10    PATIENT EDUCATION:  Education details: Exercise form, POC, HEP.  PT answered pt's questions. Person educated: Patient Education method: Explanation, Demonstration, verbal and tactile cues Education comprehension: verbalized understanding, returned demonstration, and needs further education, verbal and tactile cues required  HOME EXERCISE PROGRAM: Access Code: [email or web address] URL: [email or web address] Date: 11/22/2023 Prepared by: Gellen April Erman Hayward  Exercises - Supine Elbow Extension Stretch in Supination with tricep setting  - 1 x daily - 7 x weekly - 1 sets - 10 reps - Supine Elbow Extension Stretch in Pronation with tricep setting - 1 x daily - 7 x weekly - 1 sets - 10 reps - Supine Elbow Flexion Extension AROM  - 1 x daily - 7 x weekly - 1 sets - 10 reps  ASSESSMENT:  CLINICAL IMPRESSION:  Pt reports benefit from doorway stretch for pecs. He did have sone anterior shoulder discomfort with prone exercises. This did improve slightly with cues for proper muscle activation and positioning. Pt challenged by pronation/supination with 3# weight placed asymmetrically on roller. Pt will continue to benefit from progressive strengthening in preparation for RTW.    OBJECTIVE IMPAIRMENTS: decreased coordination, decreased mobility, decreased ROM, decreased strength, hypomobility, increased edema, increased fascial restrictions, increased muscle spasms, impaired flexibility, impaired UE functional use, improper body mechanics, postural dysfunction, and pain.   ACTIVITY LIMITATIONS: carrying, lifting, bathing, toileting, dressing, reach over head, and hygiene/grooming  PARTICIPATION LIMITATIONS:  meal prep, cleaning, laundry, driving, shopping, community activity, and occupation  PERSONAL FACTORS: Age, Fitness, Past/current experiences, and Time since onset of injury/illness/exacerbation are also affecting patient's functional outcome.   REHAB POTENTIAL: Good  CLINICAL DECISION MAKING: Evolving/moderate complexity  EVALUATION COMPLEXITY: Moderate   GOALS: Goals reviewed with patient? Yes  SHORT TERM GOALS: Target date: 12/13/2023  Pt will be ind with initial HEP Baseline: Goal status: INITIAL  2.  Pt will have improved elbow ROM to 10-135 deg Baseline:  Goal status:  GOAL MET  5/30  3.  Pt will have improved shoulder flexion and abd by at least 10 deg Baseline:  Goal status:  50% MET  5/27   LONG TERM GOALS: Target date: 02/09/2024   Pt will be ind with management and progression of HEP Baseline:  Goal status: PROGRESSING  2.  Pt will have improved elbow ROM to at least 5-140 deg Baseline:  Goal status: 60%  MET  5/30  3.  Pt will have full and pain free shoulder elevation for overhead tasks Baseline:  Goal status: NOT MET  4.  Pt will be able to tolerate lifting and carrying  at least 50# for work tasks Baseline:  Goal status: INITIAL  5.  Pt will have R UE strength to at least 90% of L UE strength for work tasks Baseline:  Goal status: PROGRESSING  5/30  6.  Pt will demo MCID on his [employer] or UEFI Baseline:  Goal status: INITIAL   PLAN:  PT FREQUENCY: 2x/week  PT DURATION: 5 weeks  PLANNED INTERVENTIONS: 97164- PT Re-evaluation, 97110-Therapeutic exercises, 97530- Therapeutic activity, 97112- Neuromuscular re-education, 97535- Self Care, 16109- Manual therapy, [id number]- Electrical stimulation (unattended), [phone]- Ionotophoresis 4mg /ml Dexamethasone , Patient/Family education, Taping, Dry Needling, Joint mobilization, Cryotherapy, and Moist heat  PLAN FOR NEXT SESSION:  Continue elbow/shoulder ROM/strengthening, manual/joint mobs.  Pt has a FCE on  6/17.  Herb Loges, PTA  01/11/24 3:02 PM

## 2024-01-19 ENCOUNTER — Encounter (HOSPITAL_BASED_OUTPATIENT_CLINIC_OR_DEPARTMENT_OTHER): Payer: Self-pay | Admitting: Physical Therapy

## 2024-01-19 ENCOUNTER — Ambulatory Visit (HOSPITAL_BASED_OUTPATIENT_CLINIC_OR_DEPARTMENT_OTHER): Payer: Worker's Compensation | Admitting: Physical Therapy

## 2024-01-19 DIAGNOSIS — M25621 Stiffness of right elbow, not elsewhere classified: Secondary | ICD-10-CM

## 2024-01-19 DIAGNOSIS — M6281 Muscle weakness (generalized): Secondary | ICD-10-CM

## 2024-01-19 DIAGNOSIS — M25521 Pain in right elbow: Secondary | ICD-10-CM

## 2024-01-19 NOTE — Therapy (Signed)
 OUTPATIENT PHYSICAL THERAPY TREATMENT    Patient Name: Mark Miranda MRN: [id number] DOB:May 16, 1971, 53 y.o., male Today's Date: 01/19/2024  END OF SESSION:  PT End of Session - 01/19/24 0803     Visit Number 12    Number of Visits 17    Date for PT Re-Evaluation 02/09/24    Authorization Type Worker's Comp    PT Start Time 0803    PT Stop Time 0843    PT Time Calculation (min) 40 min    Activity Tolerance Patient tolerated treatment well    Behavior During Therapy [facility] for tasks assessed/performed                  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   Hypercholesterolemia    Hypertension    Metabolic acidosis    Recurrent peritonitis (HCC)    Type 1 diabetes with renal manifestations, controlled (HCC)    Vitamin D  deficiency    Past Surgical History:  Procedure Laterality Date   AV FISTULA PLACEMENT  01/10/2012   Procedure: ARTERIOVENOUS (AV) FISTULA CREATION;  Surgeon: Richrd Char, MD;  Location: [facility] OR;  Service: Vascular;  Laterality: Left;  Left Brachial Cephalic Arteriovenous Fistula   CAPD INSERTION  01/09/2012   Procedure: CONTINUOUS AMBULATORY PERITONEAL DIALYSIS  (CAPD) CATHETER INSERTION;  Surgeon: Levert Ready, MD;  Location: [facility] OR;  Service: General;  Laterality: N/A;  Exteriorization PD Cath   CAPSULOTOMY Right 11/07/2023   Procedure: CAPSULOTOMY, ELBOW;  Surgeon: Wilhelmenia Harada, MD;  Location: [facility];  Service: Orthopedics;  Laterality: Right;  RIGHT ELBOW CAPSULAR RELEASE   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   received left kidney, 3 renal arteries, 1 renal vein, ureter and pancreas from 53 year old    HARDWARE REMOVAL Right 11/07/2023   Procedure: REMOVAL, HARDWARE;  Surgeon: Wilhelmenia Harada, MD;  Location: [facility];  Service: Orthopedics;  Laterality: Right;   INSERTION OF DIALYSIS CATHETER  01/13/2012   Procedure: INSERTION OF DIALYSIS CATHETER;  Surgeon: Mayo Speck, MD;  Location: [facility] OR;  Service: Vascular;  Laterality: Right;  Internal Jugular   LAPAROSCOPY  03/13/2012   Procedure: LAPAROSCOPY DIAGNOSTIC;  Surgeon: Eddye Goodie, MD;  Location: [facility] OR;  Service: General;  Laterality: N/A;   LIVER BIOPSY  04/02/2013   open   ORIF HUMERUS FRACTURE Right 03/07/2023   Procedure: OPEN REDUCTION INTERNAL FIXATION (ORIF) DISTAL HUMERUS FRACTURE;  Surgeon: Wilhelmenia Harada, MD;  Location: [facility] ORS;  Service: Orthopedics;  Laterality: Right;   PERITONEAL CATHETER INSERTION     PERITONEAL CATHETER REMOVAL     SMALL INTESTINE SURGERY  04/02/2013   TRICEPS TENDON REPAIR  03/07/2023   Procedure: TRICEPS TENDON REPAIR;  Surgeon: Wilhelmenia Harada, MD;  Location: [facility] ORS;  Service: Orthopedics;;   ULNAR NERVE TRANSPOSITION Right 03/07/2023   Procedure: ULNAR NERVE EXPLORATION;  Surgeon: Wilhelmenia Harada, MD;  Location: [facility] ORS;  Service: Orthopedics;  Laterality: Right;   Patient Active Problem List   Diagnosis Date Noted   Hyperlipidemia LDL goal <70 11/13/2023   Closed fracture of right distal humerus 03/07/2023   Closed bicondylar fracture of distal end of right humerus 03/04/2023  Type 2 diabetes mellitus with diabetic chronic kidney disease (HCC) 07/22/2022   Depression, major, 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/2022   Erectile dysfunction 07/24/2017   Vitamin D  deficiency, unspecified 07/24/2017   Low back pain 02/25/2016   Chronic fatigue 01/25/2016   Diabetic peripheral neuropathy (HCC) 11/24/2015   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   Depression 11/02/2015   Tobacco abuse 11/02/2015   End stage renal disease (HCC) 12/29/2011   HTN  (hypertension) 01/25/2011    PCP: [name], [name], MD  REFERRING PROVIDER: Wilhelmenia Harada, MD  REFERRING DIAG: [phone] (ICD-10-CM) - Other closed displaced fracture of distal end of right humerus, initial encounter  THERAPY DIAG:  Stiffness of right elbow, not elsewhere classified  Muscle weakness (generalized)  Pain in right elbow  Rationale for Evaluation and Treatment: Rehabilitation  ONSET DATE: 11/07/23 R distal humeral elbow hardware removal  SUBJECTIVE:   SUBJECTIVE STATEMENT: Pt reports elbow and shoulder are still bothering him. Doesn't feel his arm is normal yet. Pain and weakness still.    PERTINENT HISTORY: Per 11/16/23: Status post right distal humeral elbow hardware removal.  At this time he is progressing with range of motion.  I would like him to work with physical therapy to progress his flexion and extension.   PAIN:  Are you having pain? Yes: NPRS scale: 2/10 in shoulder, 0/10 in elbow at rest, 3/10 elbow with fully extending elbow Pain location: see above Pain description: sharp Aggravating factors: Sleeping, leaning Relieving factors: massage   PRECAUTIONS: None  RED FLAGS: None   WEIGHT BEARING RESTRICTIONS: No  FALLS:  Has patient fallen in last 6 months? 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  Pt wants full strength of his arm and perform act's without pain.  NEXT MD VISIT: 11/23/23 for suture removal  OBJECTIVE:  Note: Objective measures were completed at Evaluation unless otherwise noted.  DIAGNOSTIC FINDINGS: R elbow x-ray 10/29/23 IMPRESSION: 1. Status post ORIF of the distal humerus without evidence of hardware complication. 2. Several calcific densities  along the palmar aspect of the elbow joint may reflect loose bodies.   COGNITION: Overall cognitive status: Within functional limits for tasks assessed      MUSCLE LENGTH: Biceps: ROM below    UPPER EXTREMITY ROM:  Active ROM (measured in supine) Right eval Left eval Right 5/16 Right 5/27  Left 5/27 Right 5/30  Shoulder flexion 145*   148 with pain    Shoulder extension        Shoulder abduction 85*   118 with pain 157   Shoulder adduction        Shoulder extension        Shoulder internal rotation 80       Shoulder external rotation 75       Elbow flexion 115 145 143 144    Elbow extension -20 3 -12/-10 AROM/PROM -15/-11 AROM/PROM  -10 AROM  Wrist flexion        Wrist extension        Wrist ulnar deviation        Wrist radial deviation        Wrist pronation      WFL bilat  Wrist  supination      WFL bilat   (Blank rows = not tested, * = pain)  UPPER EXTREMITY MMT:  MMT Right eval Left eval Right 5/30 Comparison 5/30  Shoulder flexion 4     Shoulder extension      Shoulder abduction 4-     Shoulder adduction      Shoulder extension      Shoulder internal rotation 4-     Shoulder external rotation 3+     Middle trapezius      Lower trapezius      Elbow flexion (90 deg) 24.7, 23.6 38.3, 40.7 29.9, 29.5 R  73% of L  Elbow extension (90 deg) 12.9, 9.2 21.9, 28.3 19.8 R 70% of L  Wrist flexion      Wrist extension      Wrist ulnar deviation      Wrist radial deviation      Wrist pronation   5/5   Wrist supination   5/5 with shoulder pain   Grip strength 35 lbs 55 lbs 49 lbs    (Blank rows = not tested)                                                                                                                                TREATMENT DATE:  01/19/24 Pulleys abd Theraband row Black TB x30 Theraband shoulder extension Blue TB x30 Theraband triceps extension blue TB 2 x 15 Bilateral shoulder ER with scap retraction GTB 2 x 10 Standing  shoulder horizontal abduction GTB 2 x 10 Elbow flexion 5# 3x 15 Hammer curl 5# 3x 15 Prone row 3# 3x10 Prone shoulder extension 3# 3 x 10 Prone shoulder horizontal abduction 2# 3 x 10 Prone triceps extension 2# 3 x 10 Seated Pronation/supination with 3lb weight on roller 2 x 10ea  6/5  Elbow flexion 5# x30 Hammer curl 5# x30 Seated press out 3# 3x10 Prone triceps extension 3# x5 (decreased to 2#) x20 Prone row 3# 3x10 Theraband row Blue TB x30 Theraband shoulder extension GTB x30 Theraband triceps extension GTB x30 Pulleys abd Pronation/supination with 3lb weight on roller x20ea Wrist flex/ext 5# dumbbell x30ea UBE 4 min- alternating fwd/back each minute   6/2 Pt received R elbow flex and extension PROM and forearm pronation and supination PROM in supine per pt and tissue tolerance. STM to biceps/ant deltoid Elbow flexion 5# 3x10 Hammer curl 5# 3x10 Plinth push ups x10 Seated press out 3# x15 Prone triceps extension 0# x15, 2# x30 Theraband row Blue TB x30 Theraband shoulder extension GTB x30 Theraband triceps extension GTB x30 Pulleys    Flex bar (red) U/Ns and twists x30ea UBE 4 min- alternating fwd/back each minute   01/04/2024 Reviewed current function, pain levels, HEP compliance, and response to prior Rx.  UEFI:  43/80 PT assessed ROM and strength.  See above  UBE x 5 min alternating fwd/bwd Pt received R elbow flexion  and extension PROM per pt and tissue tolerance Flex bar (red) U/Ns and twists x30ea Standing elbow extension with YTB 2x10  01/02/2024 PT assessed shoulder and elbow ROM.  See above  UBE 4 min- alternating fwd/back each minute Pt received R elbow flex and extension PROM and forearm pronation and supination PROM in supine per pt and tissue tolerance. Elbow flexion 2#  x10, 4# x10 Hammer curl 1# x10, 4# x10 Flex bar (red) U/Ns and twists x30ea Standing shoulder ext red TB 2x10     12/29/2023 Pt received R elbow flex and  extension PROM and forearm pronation and supination PROM in supine per pt and tissue tolerance. STM to supinator m, pec, lat, periscap mm Elbow flexion 1#  x10, 4# x10 Hammer curl 1# x10, 4# x10 Seated press out 3# x15 Prone triceps extension 0# x30 Flex bar (red) U/Ns and twists x30ea Supine shoulder ABC x 1 rep UBE 4 min- alternating fwd/back each minute   12/26/2023 Pulleys x 20 reps in flexion and abduction Pt received R elbow flex and extension PROM and forearm pronation and supination PROM in supine per pt and tissue tolerance. Elbow flexion 1#  x15, 2# x10 Hammer curl 1# x10, 2# x10 Prone triceps extension 0# x20 Flex bar (red) U/Ns and twists x30ea Supine shoulder ABC x 1 rep Standing bilat ER with RTB approx 6 reps S/L ER x 10 Standing shoulder ext red TB 3x10 Standing row with RTB 2x10    PATIENT EDUCATION:  Education details: Exercise form, POC, HEP.  PT answered pt's questions. Person educated: Patient Education method: Explanation, Demonstration, verbal and tactile cues Education comprehension: verbalized understanding, returned demonstration, and needs further education, verbal and tactile cues required  HOME EXERCISE PROGRAM: Access Code: [email or web address] URL: [email or web address] Date: 11/22/2023 Prepared by: Gellen April Erman [place]ward  Exercises - Supine Elbow Extension Stretch in Supination with tricep setting  - 1 x daily - 7 x weekly - 1 sets - 10 reps - Supine Elbow Extension Stretch in Pronation with tricep setting - 1 x daily - 7 x weekly - 1 sets - 10 reps - Supine Elbow Flexion Extension AROM  - 1 x daily - 7 x weekly - 1 sets - 10 reps  ASSESSMENT:  CLINICAL IMPRESSION:  Continued with shoulder and RUE strengthening with intermittent symptoms throughout in both shoulder and elbow regions but patient able to complete. Intermittent cueing for mechanics and control. Progression in resistance and reps as able. Patient will continue to benefit  from physical therapy in order to improve function and reduce impairment.    OBJECTIVE IMPAIRMENTS: decreased coordination, decreased mobility, decreased ROM, decreased strength, hypomobility, increased edema, increased fascial restrictions, increased muscle spasms, impaired flexibility, impaired UE functional use, improper body mechanics, postural dysfunction, and pain.   ACTIVITY LIMITATIONS: carrying, lifting, bathing, toileting, dressing, reach over head, and hygiene/grooming  PARTICIPATION LIMITATIONS: meal prep, cleaning, laundry, driving, shopping, community activity, and occupation  PERSONAL FACTORS: Age, Fitness, Past/current experiences, and Time since onset of injury/illness/exacerbation are also affecting patient's functional outcome.   REHAB POTENTIAL: Good  CLINICAL DECISION MAKING: Evolving/moderate complexity  EVALUATION COMPLEXITY: Moderate   GOALS: Goals reviewed with patient? Yes  SHORT TERM GOALS: Target date: 12/13/2023  Pt will be ind with initial HEP Baseline: Goal status: INITIAL  2.  Pt will have improved elbow ROM to 10-135 deg Baseline:  Goal status:  GOAL MET  5/30  3.  Pt will have improved shoulder flexion and abd by at least 10 deg  Baseline:  Goal status:  50% MET  5/27   LONG TERM GOALS: Target date: 02/09/2024   Pt will be ind with management and progression of HEP Baseline:  Goal status: PROGRESSING  2.  Pt will have improved elbow ROM to at least 5-140 deg Baseline:  Goal status: 60%  MET  5/30  3.  Pt will have full and pain free shoulder elevation for overhead tasks Baseline:  Goal status: NOT MET  4.  Pt will be able to tolerate lifting and carrying at least 50# for work tasks Baseline:  Goal status: INITIAL  5.  Pt will have R UE strength to at least 90% of L UE strength for work tasks Baseline:  Goal status: PROGRESSING  5/30  6.  Pt will demo MCID on his Quick[employer] or UEFI Baseline:  Goal status:  INITIAL   PLAN:  PT FREQUENCY: 2x/week  PT DURATION: 5 weeks  PLANNED INTERVENTIONS: 97164- PT Re-evaluation, 97110-Therapeutic exercises, 97530- Therapeutic activity, [id number]- Neuromuscular re-education, 97535- Self Care, 54098- Manual therapy, [id number]- Electrical stimulation (unattended), [phone]- Ionotophoresis 4mg /ml Dexamethasone , Patient/Family education, Taping, Dry Needling, Joint mobilization, Cryotherapy, and Moist heat  PLAN FOR NEXT SESSION:  Continue elbow/shoulder ROM/strengthening, manual/joint mobs.  Pt has a FCE on 6/17.   Perfecto Bracket Calvary Difranco, PT, DPT 01/19/2024, 8:03 AM

## 2024-01-21 NOTE — Therapy (Signed)
 OUTPATIENT PHYSICAL THERAPY TREATMENT    Patient Name: Mark Miranda MRN: [id number] DOB:[birth year], 53 y.o., male Today's Date: 01/22/2024  END OF SESSION:  PT End of Session - 01/22/24 0815     Visit Number 13    Number of Visits 17    Date for PT Re-Evaluation 02/09/24    Authorization Type Worker's Comp    PT Start Time 0808    PT Stop Time 0847    PT Time Calculation (min) 39 min    Activity Tolerance Patient tolerated treatment well    Behavior During Therapy [facility] for tasks assessed/performed                   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   Hypercholesterolemia    Hypertension    Metabolic acidosis    Recurrent peritonitis (HCC)    Type 1 diabetes with renal manifestations, controlled (HCC)    Vitamin D  deficiency    Past Surgical History:  Procedure Laterality Date   AV FISTULA PLACEMENT  01/10/2012   Procedure: ARTERIOVENOUS (AV) FISTULA CREATION;  Surgeon: Richrd Char, MD;  Location: [facility] OR;  Service: Vascular;  Laterality: Left;  Left Brachial Cephalic Arteriovenous Fistula   CAPD INSERTION  01/09/2012   Procedure: CONTINUOUS AMBULATORY PERITONEAL DIALYSIS  (CAPD) CATHETER INSERTION;  Surgeon: Levert Ready, MD;  Location: [facility] OR;  Service: General;  Laterality: N/A;  Exteriorization PD Cath   CAPSULOTOMY Right 11/07/2023   Procedure: CAPSULOTOMY, ELBOW;  Surgeon: Wilhelmenia Harada, MD;  Location: [facility];  Service: Orthopedics;  Laterality: Right;  RIGHT ELBOW CAPSULAR RELEASE   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   received left kidney, 3 renal arteries, 1 renal vein, ureter and pancreas from 53 year old    HARDWARE REMOVAL Right 11/07/2023   Procedure: REMOVAL, HARDWARE;  Surgeon: Wilhelmenia Harada, MD;  Location: [facility];  Service: Orthopedics;  Laterality: Right;   INSERTION OF DIALYSIS CATHETER  01/13/2012   Procedure: INSERTION OF DIALYSIS CATHETER;  Surgeon: Mayo Speck, MD;  Location: [facility] OR;  Service: Vascular;  Laterality: Right;  Internal Jugular   LAPAROSCOPY  03/13/2012   Procedure: LAPAROSCOPY DIAGNOSTIC;  Surgeon: Eddye Goodie, MD;  Location: [facility] OR;  Service: General;  Laterality: N/A;   LIVER BIOPSY  04/02/2013   open   ORIF HUMERUS FRACTURE Right 03/07/2023   Procedure: OPEN REDUCTION INTERNAL FIXATION (ORIF) DISTAL HUMERUS FRACTURE;  Surgeon: Wilhelmenia Harada, MD;  Location: [facility] ORS;  Service: Orthopedics;  Laterality: Right;   PERITONEAL CATHETER INSERTION     PERITONEAL CATHETER REMOVAL     SMALL INTESTINE SURGERY  04/02/2013   TRICEPS TENDON REPAIR  03/07/2023   Procedure: TRICEPS TENDON REPAIR;  Surgeon: Wilhelmenia Harada, MD;  Location: [facility] ORS;  Service: Orthopedics;;   ULNAR NERVE TRANSPOSITION Right 03/07/2023   Procedure: ULNAR NERVE EXPLORATION;  Surgeon: Wilhelmenia Harada, MD;  Location: [facility] ORS;  Service: Orthopedics;  Laterality: Right;   Patient Active Problem List   Diagnosis Date Noted   Hyperlipidemia LDL goal <70 11/13/2023   Closed fracture of right distal humerus 03/07/2023   Closed bicondylar fracture of distal end of right humerus  03/04/2023   Type 2 diabetes mellitus with diabetic chronic kidney disease (HCC) 07/22/2022   Depression, major, 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/2022   Erectile dysfunction 07/24/2017   Vitamin D  deficiency, unspecified 07/24/2017   Low back pain 02/25/2016   Chronic fatigue 01/25/2016   Diabetic peripheral neuropathy (HCC) 11/24/2015   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   Depression 11/02/2015   Tobacco abuse 11/02/2015   End stage renal disease (HCC) 12/29/2011   HTN  (hypertension) 01/25/2011    PCP: [name], [name], MD  REFERRING PROVIDER: Wilhelmenia Harada, MD  REFERRING DIAG: [phone] (ICD-10-CM) - Other closed displaced fracture of distal end of right humerus, initial encounter  THERAPY DIAG:  Stiffness of right elbow, not elsewhere classified  Muscle weakness (generalized)  Pain in right elbow  Localized edema  Rationale for Evaluation and Treatment: Rehabilitation  ONSET DATE: 11/07/23 R distal humeral elbow hardware removal  SUBJECTIVE:   SUBJECTIVE STATEMENT: Pt is 10 weeks and 6 days s/p hardware removal.  Pt states he had soreness and agitation after prior Rx.  Pt reports no recent improvements.  Pt has increased pain with movement and activity.  Pt has his FCE tomorrow.      PERTINENT HISTORY: Per 11/16/23: Status post right distal humeral elbow hardware removal.  At this time he is progressing with range of motion.  I would like him to work with physical therapy to progress his flexion and extension.   PAIN:  Are you having pain? Yes: NPRS scale: 1/10 in shoulder, 1/10 in elbow at rest, 3/10 elbow with fully extending elbow Pain location: see above Pain description: sharp Aggravating factors: Sleeping, leaning Relieving factors: massage   PRECAUTIONS: None  RED FLAGS: None   WEIGHT BEARING RESTRICTIONS: No  FALLS:  Has patient fallen in last 6 months? 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  Pt wants full strength of his arm and perform act's without pain.  NEXT MD VISIT: 11/23/23 for suture removal  OBJECTIVE:  Note: Objective measures were completed at Evaluation unless otherwise noted.  DIAGNOSTIC FINDINGS: R elbow x-ray 10/29/23  IMPRESSION: 1. Status post ORIF of the distal humerus without evidence of hardware complication. 2. Several calcific densities along the palmar aspect of the elbow joint may reflect loose bodies.   COGNITION: Overall cognitive status: Within functional limits for tasks assessed      MUSCLE LENGTH: Biceps: ROM below    UPPER EXTREMITY ROM:  Active ROM (measured in supine) Right eval Left eval Right 5/16 Right 5/27  Left 5/27 Right 5/30  Shoulder flexion 145*   148 with pain    Shoulder extension        Shoulder abduction 85*   118 with pain 157   Shoulder adduction        Shoulder extension        Shoulder internal rotation 80       Shoulder external rotation 75       Elbow flexion 115 145 143 144    Elbow extension -20 3 -12/-10 AROM/PROM -15/-11 AROM/PROM  -10 AROM  Wrist flexion        Wrist extension        Wrist  ulnar deviation        Wrist radial deviation        Wrist pronation      WFL bilat  Wrist supination      WFL bilat   (Blank rows = not tested, * = pain)  UPPER EXTREMITY MMT:  MMT Right eval Left eval Right 5/30 Comparison 5/30  Shoulder flexion 4     Shoulder extension      Shoulder abduction 4-     Shoulder adduction      Shoulder extension      Shoulder internal rotation 4-     Shoulder external rotation 3+     Middle trapezius      Lower trapezius      Elbow flexion (90 deg) 24.7, 23.6 38.3, 40.7 29.9, 29.5 R  73% of L  Elbow extension (90 deg) 12.9, 9.2 21.9, 28.3 19.8 R 70% of L  Wrist flexion      Wrist extension      Wrist ulnar deviation      Wrist radial deviation      Wrist pronation   5/5   Wrist supination   5/5 with shoulder pain   Grip strength 35 lbs 55 lbs 49 lbs    (Blank rows = not tested)                                                                                                                                TREATMENT DATE:  6/16 UBE x 4 min alternating fwd/bwd  Pt received R elbow flex and extension  PROM. Pt received forearm pronation supination PROM in supine.  Elbow flexion 5# 2x15 Hammer curl 5# 2x15 Standing shoulder extension with BTB 2x15 Standing Triceps extension 2x15 with GTB Standing bilat shoulder ER with scap retraction with GTB x 10, RTB approx 12 Prone row 3# 3x10  Pt received gentle manual elbow extension stretch 3x10 sec    01/19/24 Pulleys abd Theraband row Black TB x30 Theraband shoulder extension Blue TB x30 Theraband triceps extension blue TB 2 x 15 Bilateral shoulder ER with scap retraction GTB 2 x 10 Standing shoulder horizontal abduction GTB 2 x 10 Elbow flexion 5# 3x 15 Hammer curl 5# 3x 15 Prone row 3# 3x10 Prone shoulder extension 3# 3 x 10 Prone shoulder horizontal abduction 2# 3 x 10 Prone triceps extension 2# 3 x 10 Seated Pronation/supination with 3lb weight on roller 2 x 10ea  6/5  Elbow flexion 5# x30 Hammer curl 5# x30 Seated press out 3# 3x10 Prone triceps extension 3# x5 (decreased to 2#) x20 Prone row 3# 3x10 Theraband row Blue TB x30 Theraband shoulder extension GTB x30 Theraband triceps extension GTB x30 Pulleys abd Pronation/supination with 3lb weight on roller x20ea Wrist flex/ext 5# dumbbell x30ea UBE 4 min- alternating fwd/back each minute   6/2 Pt received R elbow flex and extension PROM and forearm pronation and supination PROM in supine  per pt and tissue tolerance. STM to biceps/ant deltoid Elbow flexion 5# 3x10 Hammer curl 5# 3x10 Plinth push ups x10 Seated press out 3# x15 Prone triceps extension 0# x15, 2# x30 Theraband row Blue TB x30 Theraband shoulder extension GTB x30 Theraband triceps extension GTB x30 Pulleys    Flex bar (red) U/Ns and twists x30ea UBE 4 min- alternating fwd/back each minute   01/04/2024 Reviewed current function, pain levels, HEP compliance, and response to prior Rx.  UEFI:  43/80 PT assessed ROM and strength.  See above  UBE x 5 min alternating fwd/bwd Pt  received R elbow flexion and extension PROM per pt and tissue tolerance Flex bar (red) U/Ns and twists x30ea Standing elbow extension with YTB 2x10  01/02/2024 PT assessed shoulder and elbow ROM.  See above  UBE 4 min- alternating fwd/back each minute Pt received R elbow flex and extension PROM and forearm pronation and supination PROM in supine per pt and tissue tolerance. Elbow flexion 2#  x10, 4# x10 Hammer curl 1# x10, 4# x10 Flex bar (red) U/Ns and twists x30ea Standing shoulder ext red TB 2x10     12/29/2023 Pt received R elbow flex and extension PROM and forearm pronation and supination PROM in supine per pt and tissue tolerance. STM to supinator m, pec, lat, periscap mm Elbow flexion 1#  x10, 4# x10 Hammer curl 1# x10, 4# x10 Seated press out 3# x15 Prone triceps extension 0# x30 Flex bar (red) U/Ns and twists x30ea Supine shoulder ABC x 1 rep UBE 4 min- alternating fwd/back each minute    PATIENT EDUCATION:  Education details: Exercise form, POC, HEP.  PT answered pt's questions. Person educated: Patient Education method: Explanation, Demonstration, verbal and tactile cues Education comprehension: verbalized understanding, returned demonstration, and needs further education, verbal and tactile cues required  HOME EXERCISE PROGRAM: Access Code: [email or web address] URL: [email or web address] Date: 11/22/2023 Prepared by: Gellen April Erman Hayward  Exercises - Supine Elbow Extension Stretch in Supination with tricep setting  - 1 x daily - 7 x weekly - 1 sets - 10 reps - Supine Elbow Extension Stretch in Pronation with tricep setting - 1 x daily - 7 x weekly - 1 sets - 10 reps - Supine Elbow Flexion Extension AROM  - 1 x daily - 7 x weekly - 1 sets - 10 reps  ASSESSMENT:  CLINICAL IMPRESSION:  Pt has increased pain with movement and activity including end range elbow extension.  Pt continues to report shoulder pain also.  Pt is improving with elbow extension  ROM.  He tolerated PROM well.  Pt performed exercises well with minimal cuing for correct form.  He is improving with R UE strength as evidenced by performance of exercises.  Pt reports shoulder pain with bilat shoulder ER with theraband.  PT decreased resistance though pt continued to have shoulder pain.  He responded well to Rx stating his pain was a tad bit more after Rx.  Pt has his FCE tomorrow.    OBJECTIVE IMPAIRMENTS: decreased coordination, decreased mobility, decreased ROM, decreased strength, hypomobility, increased edema, increased fascial restrictions, increased muscle spasms, impaired flexibility, impaired UE functional use, improper body mechanics, postural dysfunction, and pain.   ACTIVITY LIMITATIONS: carrying, lifting, bathing, toileting, dressing, reach over head, and hygiene/grooming  PARTICIPATION LIMITATIONS: meal prep, cleaning, laundry, driving, shopping, community activity, and occupation  PERSONAL FACTORS: Age, Fitness, Past/current experiences, and Time since onset of injury/illness/exacerbation are also affecting patient's functional outcome.   REHAB POTENTIAL: Good  CLINICAL DECISION MAKING: Evolving/moderate complexity  EVALUATION COMPLEXITY: Moderate   GOALS: Goals reviewed with patient? Yes  SHORT TERM GOALS: Target date: 12/13/2023  Pt will be ind with initial HEP Baseline: Goal status: INITIAL  2.  Pt will have improved elbow ROM to 10-135 deg Baseline:  Goal status:  GOAL MET  5/30  3.  Pt will have improved shoulder flexion and abd by at least 10 deg Baseline:  Goal status:  50% MET  5/27   LONG TERM GOALS: Target date: 02/09/2024   Pt will be ind with management and progression of HEP Baseline:  Goal status: PROGRESSING  2.  Pt will have improved elbow ROM to at least 5-140 deg Baseline:  Goal status: 60%  MET  5/30  3.  Pt will have full and pain free shoulder elevation for overhead tasks Baseline:  Goal status: NOT MET  4.  Pt  will be able to tolerate lifting and carrying at least 50# for work tasks Baseline:  Goal status: INITIAL  5.  Pt will have R UE strength to at least 90% of L UE strength for work tasks Baseline:  Goal status: PROGRESSING  5/30  6.  Pt will demo MCID on his QuickD[employer] or UEFI Baseline:  Goal status: INITIAL   PLAN:  PT FREQUENCY: 2x/week  PT DURATION: 5 weeks  PLANNED INTERVENTIONS: 97164- PT Re-evaluation, 97110-Therapeutic exercises, 97530- Therapeutic activity, 97112- Neuromuscular re-education, 97535- Self Care, 16109- Manual therapy, [id number]- Electrical stimulation (unattended), [phone]- Ionotophoresis 4mg /ml Dexamethasone , Patient/Family education, Taping, Dry Needling, Joint mobilization, Cryotherapy, and Moist heat  PLAN FOR NEXT SESSION:  Continue elbow/shoulder ROM/strengthening, manual/joint mobs.  Pt has a FCE on 6/17.   Trina Fujita III PT, DPT 01/22/24 1:28 PM

## 2024-01-22 ENCOUNTER — Ambulatory Visit (HOSPITAL_BASED_OUTPATIENT_CLINIC_OR_DEPARTMENT_OTHER): Payer: Worker's Compensation | Admitting: Physical Therapy

## 2024-01-22 ENCOUNTER — Encounter (HOSPITAL_BASED_OUTPATIENT_CLINIC_OR_DEPARTMENT_OTHER): Payer: Self-pay | Admitting: Physical Therapy

## 2024-01-22 DIAGNOSIS — M6281 Muscle weakness (generalized): Secondary | ICD-10-CM

## 2024-01-22 DIAGNOSIS — M25621 Stiffness of right elbow, not elsewhere classified: Secondary | ICD-10-CM

## 2024-01-22 DIAGNOSIS — R600 Localized edema: Secondary | ICD-10-CM

## 2024-01-22 DIAGNOSIS — M25521 Pain in right elbow: Secondary | ICD-10-CM

## 2024-01-25 ENCOUNTER — Telehealth (HOSPITAL_BASED_OUTPATIENT_CLINIC_OR_DEPARTMENT_OTHER): Payer: Self-pay | Admitting: Orthopaedic Surgery

## 2024-01-25 NOTE — Telephone Encounter (Signed)
 Patient needs to set up an appointment because he just finished his FCE testing. Is this something I just sch please advise. Best contact for patient [phone]

## 2024-02-02 ENCOUNTER — Ambulatory Visit (INDEPENDENT_AMBULATORY_CARE_PROVIDER_SITE_OTHER): Payer: Worker's Compensation

## 2024-02-02 ENCOUNTER — Other Ambulatory Visit (HOSPITAL_BASED_OUTPATIENT_CLINIC_OR_DEPARTMENT_OTHER): Payer: Self-pay

## 2024-02-02 ENCOUNTER — Ambulatory Visit (HOSPITAL_BASED_OUTPATIENT_CLINIC_OR_DEPARTMENT_OTHER): Payer: Worker's Compensation | Admitting: Orthopaedic Surgery

## 2024-02-02 DIAGNOSIS — S42491A Other displaced fracture of lower end of right humerus, initial encounter for closed fracture: Secondary | ICD-10-CM

## 2024-02-02 MED ORDER — LIDOCAINE HCL 1 % IJ SOLN
4.0000 mL | INTRAMUSCULAR | Status: AC | PRN
  Administered 2024-02-02: 4 mL

## 2024-02-02 MED ORDER — TRIAMCINOLONE ACETONIDE 40 MG/ML IJ SUSP
80.0000 mg | INTRAMUSCULAR | Status: AC | PRN
  Administered 2024-02-02: 80 mg via INTRA_ARTICULAR

## 2024-02-02 NOTE — Progress Notes (Signed)
 Post Operative Evaluation    Procedure/Date of Surgery: Right distal humerus removal of hardware with ulnar neurolysis 4/1  Interval History:   Presents today status post removal of hardware for the elbow.  Range of motion is improved about the right elbow.  He has gone for a subsequent functional capacity evaluation which did show that he would not be able to perform all of the duties of his current job due to loss of strength in the right arm and elbow.  PMH/PSH/Family History/Social History/Meds/Allergies:    Past Medical History:  Diagnosis Date  . BK viremia   . Closed fracture of right elbow 03/04/2023  . Depression   . Diabetic peripheral neuropathy (HCC) 11/24/2015  . End stage renal disease (HCC)    2014 had left kidney/prancreas transplant  . Erectile dysfunction   . 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 Hypercholesterolemia   . Hypertension   . Metabolic acidosis   . Recurrent peritonitis (HCC)   . Type 1 diabetes with renal manifestations, controlled (HCC)   . Vitamin D  deficiency    Past Surgical History:  Procedure Laterality Date  . AV FISTULA PLACEMENT  01/10/2012   Procedure: ARTERIOVENOUS (AV) FISTULA CREATION;  Surgeon: Carlin FORBES Haddock, MD;  Location: [facility] OR;  Service: Vascular;  Laterality: Left;  Left Brachial Cephalic Arteriovenous Fistula  . CAPD INSERTION  01/09/2012   Procedure: CONTINUOUS AMBULATORY PERITONEAL DIALYSIS  (CAPD) CATHETER INSERTION;  Surgeon: Elon CHRISTELLA Pacini, MD;  Location: [facility] OR;  Service: General;  Laterality: N/A;  Exteriorization PD Cath  . CAPSULOTOMY Right 11/07/2023   Procedure: CAPSULOTOMY, ELBOW;  Surgeon: Genelle Standing, MD;  Location: [facility];  Service: Orthopedics;  Laterality: Right;  RIGHT ELBOW CAPSULAR RELEASE  . 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 HARDWARE REMOVAL Right 11/07/2023   Procedure: REMOVAL, HARDWARE;  Surgeon: Genelle Standing, MD;  Location: [facility];  Service: Orthopedics;  Laterality: Right;  . INSERTION OF DIALYSIS CATHETER  01/13/2012   Procedure: INSERTION OF DIALYSIS CATHETER;  Surgeon: Krystal JULIANNA Doing, MD;  Location: [facility] OR;  Service: Vascular;  Laterality: Right;  Internal Jugular  . LAPAROSCOPY  03/13/2012   Procedure: LAPAROSCOPY DIAGNOSTIC;  Surgeon: Standing KYM Schultze, MD;  Location: [facility] OR;  Service: General;  Laterality: N/A;  . LIVER BIOPSY  04/02/2013   open  . ORIF HUMERUS FRACTURE Right 03/07/2023   Procedure: OPEN REDUCTION INTERNAL FIXATION (ORIF) DISTAL HUMERUS FRACTURE;  Surgeon: Genelle Standing, MD;  Location: [facility] ORS;  Service: Orthopedics;  Laterality: Right;  . PERITONEAL CATHETER INSERTION    . PERITONEAL CATHETER REMOVAL    . SMALL INTESTINE SURGERY  04/02/2013  . TRICEPS TENDON REPAIR  03/07/2023   Procedure: TRICEPS TENDON REPAIR;  Surgeon: Genelle Standing, MD;  Location: [facility] ORS;  Service: Orthopedics;;  . ULNAR NERVE TRANSPOSITION Right 03/07/2023   Procedure: ULNAR NERVE EXPLORATION;  Surgeon: Genelle Standing, MD;  Location: [facility] ORS;  Service: Orthopedics;  Laterality: Right;   Social History   Socioeconomic History  . Marital status: Married    Spouse name: [name]  . Number of children: 4  . Years of education: 10  . Highest education level: Not  on file  Occupational History  . Occupation: Painter  Tobacco Use  . Smoking status: Every Day    Current packs/day: 1.00    Average packs/day: 1 pack/day for 22.0 years (22.0 ttl pk-yrs)    Types: Cigarettes  . Smokeless tobacco: Never  . Tobacco comments:    Down to 1/2 ppd with chantix   Vaping Use  . Vaping status: Former  . Substances: Nicotine   Substance and Sexual Activity  . Alcohol  use: Yes    Alcohol /week: 0.0 standard drinks of alcohol     Comment: socially  . Drug use: No  . Sexual  activity: Yes  Other Topics Concern  . Not on file  Social History Narrative   Paints houses   Wife works at a Retail banker at [employer]   4 children age 70 to 60   Regular exercise-no current exercise regimen   Caffeine Use-no   Social Drivers of Corporate investment banker Strain: Not on file  Food Insecurity: Not on file  Transportation Needs: Not on file  Physical Activity: Not on file  Stress: Not on file  Social Connections: Not on file   Family History  Problem Relation Age of Onset  . Diabetes Mother   . Hypertension Mother   . Hyperlipidemia Mother   . Cancer Father        glioblastoma   No Known Allergies Current Outpatient Medications  Medication Sig Dispense Refill  . amLODipine  (NORVASC ) 5 MG tablet Take 1 tablet (5 mg total) by mouth at bedtime. 90 tablet 1  . Blood Glucose Monitoring Suppl (ACCU-CHEK AVIVA PLUS) w/Device KIT As directed. 1 kit 0  . Continuous Glucose Receiver (DEXCOM G7 RECEIVER) DEVI 1 Device by Does not apply route daily. 1 each 1  . Continuous Glucose Sensor (DEXCOM G7 SENSOR) MISC 1 Device by Does not apply route daily. 12 each 1  . glipiZIDE (GLUCOTROL) 5 MG tablet Take by mouth 2 (two) times daily before a meal.    . lisinopril  (ZESTRIL ) 10 MG tablet Take 1 tablet (10 mg total) by mouth daily. 90 tablet 2  . mycophenolate (MYFORTIC) 180 MG EC tablet Take 4 tablets by mouth 2 (two) times daily.    . oxyCODONE  (ROXICODONE ) 5 MG immediate release tablet Take 1 tablet (5 mg total) by mouth every 4 (four) hours as needed for severe pain (pain score 7-10) or breakthrough pain. 20 tablet 0  . sitaGLIPtin (JANUVIA) 50 MG tablet Take 100 mg by mouth daily.    . tacrolimus  (PROGRAF ) 1 MG capsule Take 5 mg by mouth 2 (two) times daily. Pt take 5 mg in the morning and 4 mg at night    . varenicline  (CHANTIX ) 0.5 MG tablet Take 1 tablet (0.5 mg total) by mouth 2 (two) times daily. 60 tablet 0   No current facility-administered medications for this visit.    DG Shoulder Right Result Date: 02/02/2024 CLINICAL DATA:  pain EXAM: RIGHT SHOULDER - 2+ VIEW; RIGHT ELBOW - COMPLETE 3+ VIEW COMPARISON:  October 11, 2023 FINDINGS: Right elbow Osteopenia. Interval removal of the distal humeral orthopedic hardware. Similar alignment of the distal humerus without acute fracture or dislocation. No joint effusion. There is no evidence of arthropathy or other focal bone abnormality. Peripheral vascular atherosclerosis. Mild soft tissue swelling. No radiopaque foreign body. Right shoulder No acute fracture or dislocation. There is no evidence of arthropathy or other focal bone abnormality. Soft tissues are unremarkable. IMPRESSION: 1. Interval removal of the distal right humeral orthopedic hardware.  Similar alignment of the distal humerus without acute fracture or dislocation. No radiopaque foreign body. 2. No acute fracture or dislocation of the right shoulder. Electronically Signed   By: Rogelia Myers M.D.   On: 02/02/2024 15:35   DG Elbow Complete Right Result Date: 02/02/2024 CLINICAL DATA:  pain EXAM: RIGHT SHOULDER - 2+ VIEW; RIGHT ELBOW - COMPLETE 3+ VIEW COMPARISON:  October 11, 2023 FINDINGS: Right elbow Osteopenia. Interval removal of the distal humeral orthopedic hardware. Similar alignment of the distal humerus without acute fracture or dislocation. No joint effusion. There is no evidence of arthropathy or other focal bone abnormality. Peripheral vascular atherosclerosis. Mild soft tissue swelling. No radiopaque foreign body. Right shoulder No acute fracture or dislocation. There is no evidence of arthropathy or other focal bone abnormality. Soft tissues are unremarkable. IMPRESSION: 1. Interval removal of the distal right humeral orthopedic hardware. Similar alignment of the distal humerus without acute fracture or dislocation. No radiopaque foreign body. 2. No acute fracture or dislocation of the right shoulder. Electronically Signed   By: Rogelia Myers M.D.   On:  02/02/2024 15:35    Review of Systems:   A ROS was performed including pertinent positives and negatives as documented in the HPI.   Musculoskeletal Exam:    There were no vitals taken for this visit.  Right incision is well-appearing which is subsequently healed..  Range of motion is from 5 degrees to 140 degrees.  Sensation is intact to light touch throughout.  Fires ulnar interosseous nerve distribution as well as radial nerve with strong wrist extension and 2+ radial pulse    Imaging:     I personally reviewed and interpreted the radiographs.   Assessment:   Status post right distal humeral elbow hardware removal.  At this time his functional capacity evaluation does not show evidence that he would be able to return from a strength perspective to his previous level of function which would allow him to do heavier activities including lifting.  Given that I would like to plan on sending him for a final round physical therapy for work hardening and I will plan to see him back in 6 weeks.  He is approaching near maximal medical improvement but I would like to have him work on final range of motion about the shoulder as he does have evidence of adhesive capsulitis.  I would recommend an ultrasound-guided injection of the shoulder today to get him some relief and to improve his range of motion  Plan :    - Right shoulder ultrasound-guided injection Friday for we will set obtain    Procedure Note  Patient: Mark Miranda             Date of Birth: 1971-03-12           MRN: [id number]             Visit Date: 02/02/2024  Procedures: Visit Diagnoses:  1. Other closed displaced fracture of distal end of right humerus, initial encounter     Large Joint Inj: R glenohumeral on 02/02/2024 4:27 PM Indications: pain Details: 22 G 1.5 in needle, ultrasound-guided anterior approach  Arthrogram: No  Medications: 4 mL lidocaine  1 %; 80 mg triamcinolone  acetonide 40 MG/ML Outcome:  tolerated well, no immediate complications Procedure, treatment alternatives, risks and benefits explained, specific risks discussed. Consent was given by the patient. Immediately prior to procedure a time out was called to verify the correct patient, procedure, equipment, support staff and site/side marked as required.  Patient was prepped and draped in the usual sterile fashion.        Elspeth Parker, MD Attending Physician, Orthopedic Surgery  This document was dictated using Dragon voice recognition software. A reasonable attempt at proof reading has been made to minimize errors.

## 2024-02-05 ENCOUNTER — Ambulatory Visit (HOSPITAL_BASED_OUTPATIENT_CLINIC_OR_DEPARTMENT_OTHER): Payer: Worker's Compensation | Admitting: Physical Therapy

## 2024-02-05 DIAGNOSIS — M6281 Muscle weakness (generalized): Secondary | ICD-10-CM

## 2024-02-05 DIAGNOSIS — M25521 Pain in right elbow: Secondary | ICD-10-CM

## 2024-02-05 DIAGNOSIS — M25621 Stiffness of right elbow, not elsewhere classified: Secondary | ICD-10-CM | POA: Diagnosis not present

## 2024-02-05 NOTE — Therapy (Unsigned)
 OUTPATIENT PHYSICAL THERAPY TREATMENT    Patient Name: Mark Miranda MRN: [id number] DOB:Nov 25, 1970, 53 y.o., male Today's Date: 02/06/2024  END OF SESSION:  PT End of Session - 02/05/24 0944     Visit Number 14    Number of Visits 17    Date for PT Re-Evaluation 02/09/24    Authorization Type Worker's Comp    PT Start Time 0940    PT Stop Time 1022    PT Time Calculation (min) 42 min    Activity Tolerance Patient tolerated treatment well    Behavior During Therapy [facility] for tasks assessed/performed                   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   Hypercholesterolemia    Hypertension    Metabolic acidosis    Recurrent peritonitis (HCC)    Type 1 diabetes with renal manifestations, controlled (HCC)    Vitamin D  deficiency    Past Surgical History:  Procedure Laterality Date   AV FISTULA PLACEMENT  01/10/2012   Procedure: ARTERIOVENOUS (AV) FISTULA CREATION;  Surgeon: Carlin FORBES Haddock, MD;  Location: [facility] OR;  Service: Vascular;  Laterality: Left;  Left Brachial Cephalic Arteriovenous Fistula   CAPD INSERTION  01/09/2012   Procedure: CONTINUOUS AMBULATORY PERITONEAL DIALYSIS  (CAPD) CATHETER INSERTION;  Surgeon: Elon CHRISTELLA Pacini, MD;  Location: [facility] OR;  Service: General;  Laterality: N/A;  Exteriorization PD Cath   CAPSULOTOMY Right 11/07/2023   Procedure: CAPSULOTOMY, ELBOW;  Surgeon: Genelle Standing, MD;  Location: [facility];  Service: Orthopedics;  Laterality: Right;  RIGHT ELBOW CAPSULAR RELEASE   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   received left kidney, 3 renal arteries, 1 renal vein, ureter and pancreas from 53 year old    HARDWARE REMOVAL Right 11/07/2023   Procedure: REMOVAL, HARDWARE;  Surgeon: Genelle Standing, MD;  Location: [facility];  Service: Orthopedics;  Laterality: Right;   INSERTION OF DIALYSIS CATHETER  01/13/2012   Procedure: INSERTION OF DIALYSIS CATHETER;  Surgeon: Krystal JULIANNA Doing, MD;  Location: [facility] OR;  Service: Vascular;  Laterality: Right;  Internal Jugular   LAPAROSCOPY  03/13/2012   Procedure: LAPAROSCOPY DIAGNOSTIC;  Surgeon: Standing KYM Schultze, MD;  Location: [facility] OR;  Service: General;  Laterality: N/A;   LIVER BIOPSY  04/02/2013   open   ORIF HUMERUS FRACTURE Right 03/07/2023   Procedure: OPEN REDUCTION INTERNAL FIXATION (ORIF) DISTAL HUMERUS FRACTURE;  Surgeon: Genelle Standing, MD;  Location: [facility] ORS;  Service: Orthopedics;  Laterality: Right;   PERITONEAL CATHETER INSERTION     PERITONEAL CATHETER REMOVAL     SMALL INTESTINE SURGERY  04/02/2013   TRICEPS TENDON REPAIR  03/07/2023   Procedure: TRICEPS TENDON REPAIR;  Surgeon: Genelle Standing, MD;  Location: [facility] ORS;  Service: Orthopedics;;   ULNAR NERVE TRANSPOSITION Right 03/07/2023   Procedure: ULNAR NERVE EXPLORATION;  Surgeon: Genelle Standing, MD;  Location: [facility] ORS;  Service: Orthopedics;  Laterality: Right;   Patient Active Problem List   Diagnosis Date Noted   Hyperlipidemia LDL goal <70 11/13/2023   Closed fracture of right distal humerus 03/07/2023   Closed bicondylar fracture of distal end of right humerus  03/04/2023   Type 2 diabetes mellitus with diabetic chronic kidney disease (HCC) 07/22/2022   Depression, major, 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/2022   Erectile dysfunction 07/24/2017   Vitamin D  deficiency, unspecified 07/24/2017   Low back pain 02/25/2016   Chronic fatigue 01/25/2016   Diabetic peripheral neuropathy (HCC) 11/24/2015   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   Depression 11/02/2015   Tobacco abuse 11/02/2015   End stage renal disease (HCC) 12/29/2011   HTN  (hypertension) 01/25/2011    PCP: [name], [name], MD  REFERRING PROVIDER: Genelle Standing, MD  REFERRING DIAG: [phone] (ICD-10-CM) - Other closed displaced fracture of distal end of right humerus, initial encounter  THERAPY DIAG:  Stiffness of right elbow, not elsewhere classified  Muscle weakness (generalized)  Pain in right elbow  Rationale for Evaluation and Treatment: Rehabilitation  ONSET DATE: 11/07/23 R distal humeral elbow hardware removal  SUBJECTIVE:   SUBJECTIVE STATEMENT: Pt is 12 weeks and 6 days s/p hardware removal.   Pt had some pain after prior Rx.  He had his FCE.  Pt states he saw MD last week and he doesn't think he would be able to return to his normal work.  MD wanted pt to continue with PT for 6 more weeks.  He received an injection for shoulder and states it felt better over the weekend though pain has returned.  Pt notices his arm and feels sensitive when he is washing his hair or scrubbing dishes.      PERTINENT HISTORY: Per 11/16/23: Status post right distal humeral elbow hardware removal.  At this time he is progressing with range of motion.  I would like him to work with physical therapy to progress his flexion and extension.   PAIN:  Are you having pain? Yes: NPRS scale: 1 - 1.5/10 in R shoulder and R elbow  Pain location: see above Pain description: sharp Aggravating factors: Sleeping, leaning Relieving factors: massage   PRECAUTIONS: None  RED FLAGS: None   WEIGHT BEARING RESTRICTIONS: No  FALLS:  Has patient fallen in last 6 months? 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  Pt wants full strength of his arm and perform act's without pain.  NEXT MD VISIT:  11/23/23 for suture removal  OBJECTIVE:  Note: Objective measures were completed at Evaluation unless otherwise noted.  DIAGNOSTIC FINDINGS: R elbow x-ray 10/29/23 IMPRESSION: 1. Status post ORIF of the distal humerus without evidence of hardware complication. 2. Several calcific densities along the palmar aspect of the elbow joint may reflect loose bodies.   COGNITION: Overall cognitive status: Within functional limits for tasks assessed      MUSCLE LENGTH: Biceps: ROM below    UPPER EXTREMITY ROM:  Active ROM (measured in supine) Right eval Left eval Right 5/16 Right 5/27  Left 5/27 Right 5/30  Shoulder flexion 145*   148 with pain    Shoulder extension        Shoulder abduction 85*   118 with pain 157   Shoulder adduction        Shoulder extension        Shoulder internal rotation 80       Shoulder external rotation 75  Elbow flexion 115 145 143 144    Elbow extension -20 3 -12/-10 AROM/PROM -15/-11 AROM/PROM  -10 AROM  Wrist flexion        Wrist extension        Wrist ulnar deviation        Wrist radial deviation        Wrist pronation      WFL bilat  Wrist supination      WFL bilat   (Blank rows = not tested, * = pain)  UPPER EXTREMITY MMT:  MMT Right eval Left eval Right 5/30 Comparison 5/30  Shoulder flexion 4     Shoulder extension      Shoulder abduction 4-     Shoulder adduction      Shoulder extension      Shoulder internal rotation 4-     Shoulder external rotation 3+     Middle trapezius      Lower trapezius      Elbow flexion (90 deg) 24.7, 23.6 38.3, 40.7 29.9, 29.5 R  73% of L  Elbow extension (90 deg) 12.9, 9.2 21.9, 28.3 19.8 R 70% of L  Wrist flexion      Wrist extension      Wrist ulnar deviation      Wrist radial deviation      Wrist pronation   5/5   Wrist supination   5/5 with shoulder pain   Grip strength 35 lbs 55 lbs 49 lbs    (Blank rows = not tested)                                                                                                                                 TREATMENT DATE:  6/30 UBE x 4.5 min alternating fwd/bwd  Elbow flexion 5# x15, 6# 2x10 Hammer curl 5# 2x15 Standing shoulder extension with BTB 2x15 Standing Triceps extension 3x15 with GTB Standing Theraband Row with BTB 2x15 Pulleys in flex and scaption x 20 each  Pt received R elbow flex and extension PROM in supine.  Pt received R shoulder flexion PROM in supine.   6/16 UBE x 4 min alternating fwd/bwd  Pt received R elbow flex and extension PROM. Pt received forearm pronation supination PROM in supine.  Elbow flexion 5# 2x15 Hammer curl 5# 2x15 Standing shoulder extension with BTB 2x15 Standing Triceps extension 2x15 with GTB Standing bilat shoulder ER with scap retraction with GTB x 10, RTB approx 12 Prone row 3# 3x10  Pt received gentle manual elbow extension stretch 3x10 sec    01/19/24 Pulleys abd Theraband row Black TB x30 Theraband shoulder extension Blue TB x30 Theraband triceps extension blue TB 2 x 15 Bilateral shoulder ER with scap retraction GTB 2 x 10 Standing shoulder horizontal abduction GTB 2 x 10 Elbow flexion 5# 3x 15 Hammer curl 5# 3x 15 Prone row 3# 3x10 Prone shoulder extension 3# 3 x 10 Prone shoulder horizontal abduction  2# 3 x 10 Prone triceps extension 2# 3 x 10 Seated Pronation/supination with 3lb weight on roller 2 x 10ea  6/5  Elbow flexion 5# x30 Hammer curl 5# x30 Seated press out 3# 3x10 Prone triceps extension 3# x5 (decreased to 2#) x20 Prone row 3# 3x10 Theraband row Blue TB x30 Theraband shoulder extension GTB x30 Theraband triceps extension GTB x30 Pulleys abd Pronation/supination with 3lb weight on roller x20ea Wrist flex/ext 5# dumbbell x30ea UBE 4 min- alternating fwd/back each minute   6/2 Pt received R elbow flex and extension PROM and forearm pronation and supination PROM in supine per pt and tissue tolerance. STM to biceps/ant  deltoid Elbow flexion 5# 3x10 Hammer curl 5# 3x10 Plinth push ups x10 Seated press out 3# x15 Prone triceps extension 0# x15, 2# x30 Theraband row Blue TB x30 Theraband shoulder extension GTB x30 Theraband triceps extension GTB x30 Pulleys    Flex bar (red) U/Ns and twists x30ea UBE 4 min- alternating fwd/back each minute   01/04/2024 Reviewed current function, pain levels, HEP compliance, and response to prior Rx.  UEFI:  43/80 PT assessed ROM and strength.  See above  UBE x 5 min alternating fwd/bwd Pt received R elbow flexion and extension PROM per pt and tissue tolerance Flex bar (red) U/Ns and twists x30ea Standing elbow extension with YTB 2x10    PATIENT EDUCATION:  Education details: Exercise form, POC, HEP.   Person educated: Patient Education method: Explanation, Demonstration, verbal and tactile cues Education comprehension: verbalized understanding, returned demonstration, and needs further education, verbal and tactile cues required  HOME EXERCISE PROGRAM: Access Code: [email or web address] URL: [email or web address] Date: 11/22/2023 Prepared by: Gellen April Earnie Starring   ASSESSMENT:  CLINICAL IMPRESSION:  Pt has completed FCE and saw MD last week.  MD note indicates work hardening/PT for 6 weeks.  PT sent message to MD concerning work hardening.  He also received a shoulder injection which he reports only receiving short term benefits.  MD note indicated evidence of adhesive capsulitis and to work on shoulder ROM.  Pt continues to report the same pain in his shoulder and elbow.  He had good tolerance with exercises and reports some elbow pain t/o treatment with exercises.  Pt performed exercises well and demonstrates improved form with standing triceps extension.  Pt had pain with end range shoulder flexion PROM.  Pt had no c/o's after Rx.         OBJECTIVE IMPAIRMENTS: decreased coordination, decreased mobility, decreased ROM, decreased strength,  hypomobility, increased edema, increased fascial restrictions, increased muscle spasms, impaired flexibility, impaired UE functional use, improper body mechanics, postural dysfunction, and pain.   ACTIVITY LIMITATIONS: carrying, lifting, bathing, toileting, dressing, reach over head, and hygiene/grooming  PARTICIPATION LIMITATIONS: meal prep, cleaning, laundry, driving, shopping, community activity, and occupation  PERSONAL FACTORS: Age, Fitness, Past/current experiences, and Time since onset of injury/illness/exacerbation are also affecting patient's functional outcome.   REHAB POTENTIAL: Good  CLINICAL DECISION MAKING: Evolving/moderate complexity  EVALUATION COMPLEXITY: Moderate   GOALS: Goals reviewed with patient? Yes  SHORT TERM GOALS: Target date: 12/13/2023  Pt will be ind with initial HEP Baseline: Goal status: INITIAL  2.  Pt will have improved elbow ROM to 10-135 deg Baseline:  Goal status:  GOAL MET  5/30  3.  Pt will have improved shoulder flexion and abd by at least 10 deg Baseline:  Goal status:  50% MET  5/27   LONG TERM GOALS: Target date: 02/09/2024   Pt will  be ind with management and progression of HEP Baseline:  Goal status: PROGRESSING  2.  Pt will have improved elbow ROM to at least 5-140 deg Baseline:  Goal status: 60%  MET  5/30  3.  Pt will have full and pain free shoulder elevation for overhead tasks Baseline:  Goal status: NOT MET  4.  Pt will be able to tolerate lifting and carrying at least 50# for work tasks Baseline:  Goal status: INITIAL  5.  Pt will have R UE strength to at least 90% of L UE strength for work tasks Baseline:  Goal status: PROGRESSING  5/30  6.  Pt will demo MCID on his [employer] or UEFI Baseline:  Goal status: INITIAL   PLAN:  PT FREQUENCY: 2x/week  PT DURATION: 5 weeks  PLANNED INTERVENTIONS: 97164- PT Re-evaluation, 97110-Therapeutic exercises, 97530- Therapeutic activity, 97112- Neuromuscular  re-education, 97535- Self Care, [id number]- Manual therapy, [id number]- Electrical stimulation (unattended), [id number]- Ionotophoresis 4mg /ml Dexamethasone , Patient/Family education, Taping, Dry Needling, Joint mobilization, Cryotherapy, and Moist heat  PLAN FOR NEXT SESSION:  Continue elbow/shoulder ROM/strengthening, manual/joint mobs.     Leigh Minerva III PT, DPT 02/06/24 6:41 AM

## 2024-02-06 ENCOUNTER — Encounter (HOSPITAL_BASED_OUTPATIENT_CLINIC_OR_DEPARTMENT_OTHER): Payer: Self-pay | Admitting: Physical Therapy

## 2024-02-07 ENCOUNTER — Ambulatory Visit (HOSPITAL_BASED_OUTPATIENT_CLINIC_OR_DEPARTMENT_OTHER): Payer: Worker's Compensation | Attending: Orthopaedic Surgery | Admitting: Physical Therapy

## 2024-02-07 ENCOUNTER — Encounter (HOSPITAL_BASED_OUTPATIENT_CLINIC_OR_DEPARTMENT_OTHER): Payer: Self-pay | Admitting: Physical Therapy

## 2024-02-07 DIAGNOSIS — M6281 Muscle weakness (generalized): Secondary | ICD-10-CM | POA: Diagnosis present

## 2024-02-07 DIAGNOSIS — M25521 Pain in right elbow: Secondary | ICD-10-CM | POA: Diagnosis present

## 2024-02-07 DIAGNOSIS — M25511 Pain in right shoulder: Secondary | ICD-10-CM | POA: Insufficient documentation

## 2024-02-07 DIAGNOSIS — M25621 Stiffness of right elbow, not elsewhere classified: Secondary | ICD-10-CM | POA: Diagnosis present

## 2024-02-07 NOTE — Therapy (Signed)
 OUTPATIENT PHYSICAL THERAPY TREATMENT    Patient Name: Mark Miranda MRN: [id number] DOB:[birth year], 53 y.o., male Today's Date: 02/08/2024  END OF SESSION:  PT End of Session - 02/07/24 0901     Visit Number 15    Number of Visits 17    Date for PT Re-Evaluation 02/09/24    Authorization Type Worker's Comp    PT Start Time 0855    PT Stop Time 0935    PT Time Calculation (min) 40 min    Activity Tolerance Patient tolerated treatment well    Behavior During Therapy [facility] for tasks assessed/performed                   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   Hypercholesterolemia    Hypertension    Metabolic acidosis    Recurrent peritonitis (HCC)    Type 1 diabetes with renal manifestations, controlled (HCC)    Vitamin D  deficiency    Past Surgical History:  Procedure Laterality Date   AV FISTULA PLACEMENT  01/10/2012   Procedure: ARTERIOVENOUS (AV) FISTULA CREATION;  Surgeon: Carlin FORBES Haddock, MD;  Location: [facility] OR;  Service: Vascular;  Laterality: Left;  Left Brachial Cephalic Arteriovenous Fistula   CAPD INSERTION  01/09/2012   Procedure: CONTINUOUS AMBULATORY PERITONEAL DIALYSIS  (CAPD) CATHETER INSERTION;  Surgeon: Elon CHRISTELLA Pacini, MD;  Location: [facility] OR;  Service: General;  Laterality: N/A;  Exteriorization PD Cath   CAPSULOTOMY Right 11/07/2023   Procedure: CAPSULOTOMY, ELBOW;  Surgeon: Genelle Standing, MD;  Location: [facility];  Service: Orthopedics;  Laterality: Right;  RIGHT ELBOW CAPSULAR RELEASE   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   received left kidney, 3 renal arteries, 1 renal vein, ureter and pancreas from 53 year old    HARDWARE REMOVAL Right 11/07/2023   Procedure: REMOVAL, HARDWARE;  Surgeon: Genelle Standing, MD;  Location: [facility];  Service: Orthopedics;  Laterality: Right;   INSERTION OF DIALYSIS CATHETER  01/13/2012   Procedure: INSERTION OF DIALYSIS CATHETER;  Surgeon: Krystal JULIANNA Doing, MD;  Location: [facility] OR;  Service: Vascular;  Laterality: Right;  Internal Jugular   LAPAROSCOPY  03/13/2012   Procedure: LAPAROSCOPY DIAGNOSTIC;  Surgeon: Standing KYM Schultze, MD;  Location: [facility] OR;  Service: General;  Laterality: N/A;   LIVER BIOPSY  04/02/2013   open   ORIF HUMERUS FRACTURE Right 03/07/2023   Procedure: OPEN REDUCTION INTERNAL FIXATION (ORIF) DISTAL HUMERUS FRACTURE;  Surgeon: Genelle Standing, MD;  Location: [facility] ORS;  Service: Orthopedics;  Laterality: Right;   PERITONEAL CATHETER INSERTION     PERITONEAL CATHETER REMOVAL     SMALL INTESTINE SURGERY  04/02/2013   TRICEPS TENDON REPAIR  03/07/2023   Procedure: TRICEPS TENDON REPAIR;  Surgeon: Genelle Standing, MD;  Location: [facility] ORS;  Service: Orthopedics;;   ULNAR NERVE TRANSPOSITION Right 03/07/2023   Procedure: ULNAR NERVE EXPLORATION;  Surgeon: Genelle Standing, MD;  Location: [facility] ORS;  Service: Orthopedics;  Laterality: Right;   Patient Active Problem List   Diagnosis Date Noted   Hyperlipidemia LDL goal <70 11/13/2023   Closed fracture of right distal humerus 03/07/2023   Closed bicondylar fracture of distal end of right humerus  03/04/2023   Type 2 diabetes mellitus with diabetic chronic kidney disease (HCC) 07/22/2022   Depression, major, 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/2022   Erectile dysfunction 07/24/2017   Vitamin D  deficiency, unspecified 07/24/2017   Low back pain 02/25/2016   Chronic fatigue 01/25/2016   Diabetic peripheral neuropathy (HCC) 11/24/2015   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   Depression 11/02/2015   Tobacco abuse 11/02/2015   End stage renal disease (HCC) 12/29/2011   HTN  (hypertension) 01/25/2011    PCP: [name], [name], MD  REFERRING PROVIDER: Genelle Standing, MD  REFERRING DIAG: [phone] (ICD-10-CM) - Other closed displaced fracture of distal end of right humerus, initial encounter  THERAPY DIAG:  Stiffness of right elbow, not elsewhere classified  Muscle weakness (generalized)  Pain in right elbow  Rationale for Evaluation and Treatment: Rehabilitation  ONSET DATE: 11/07/23 R distal humeral elbow hardware removal  SUBJECTIVE:   SUBJECTIVE STATEMENT: Pt is 13 weeks and 1 day s/p hardware removal.   Pt states he had a little increased pain after prior Rx.  Pt continues to report pain with extending elbow.      PERTINENT HISTORY: Per 11/16/23: Status post right distal humeral elbow hardware removal.  At this time he is progressing with range of motion.  I would like him to work with physical therapy to progress his flexion and extension.   PAIN:  Are you having pain? Yes: NPRS scale:  very minor pain R shoulder and R elbow  Pain location: see above Pain description: sharp Aggravating factors: Sleeping, leaning Relieving factors: massage   PRECAUTIONS: None  RED FLAGS: None   WEIGHT BEARING RESTRICTIONS: No  FALLS:  Has patient fallen in last 6 months? 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  Pt wants full strength of his arm and perform act's without pain.  NEXT MD VISIT: 11/23/23 for suture removal  OBJECTIVE:  Note: Objective measures were completed at Evaluation unless otherwise noted.  DIAGNOSTIC FINDINGS: R elbow x-ray 10/29/23 IMPRESSION: 1. Status post ORIF of the distal humerus without evidence of hardware complication. 2. Several calcific  densities along the palmar aspect of the elbow joint may reflect loose bodies.   COGNITION: Overall cognitive status: Within functional limits for tasks assessed      MUSCLE LENGTH: Biceps: ROM below    UPPER EXTREMITY ROM:  Active ROM (measured in supine) Right eval Left eval Right 5/16 Right 5/27  Left 5/27 Right 5/30  Shoulder flexion 145*   148 with pain    Shoulder extension        Shoulder abduction 85*   118 with pain 157   Shoulder adduction        Shoulder extension        Shoulder internal rotation 80       Shoulder external rotation 75       Elbow flexion 115 145 143 144    Elbow extension -20 3 -12/-10 AROM/PROM -15/-11 AROM/PROM  -10 AROM  Wrist flexion        Wrist extension        Wrist ulnar deviation        Wrist radial deviation  Wrist pronation      WFL bilat  Wrist supination      WFL bilat   (Blank rows = not tested, * = pain)  UPPER EXTREMITY MMT:  MMT Right eval Left eval Right 5/30 Comparison 5/30  Shoulder flexion 4     Shoulder extension      Shoulder abduction 4-     Shoulder adduction      Shoulder extension      Shoulder internal rotation 4-     Shoulder external rotation 3+     Middle trapezius      Lower trapezius      Elbow flexion (90 deg) 24.7, 23.6 38.3, 40.7 29.9, 29.5 R  73% of L  Elbow extension (90 deg) 12.9, 9.2 21.9, 28.3 19.8 R 70% of L  Wrist flexion      Wrist extension      Wrist ulnar deviation      Wrist radial deviation      Wrist pronation   5/5   Wrist supination   5/5 with shoulder pain   Grip strength 35 lbs 55 lbs 49 lbs    (Blank rows = not tested)                                                                                                                                TREATMENT DATE:  7/2 UBE x 6 mins fwd/bwd lvl 1-2 Elbow flexion 5# x15, 6# 2x10 Hammer curl 5# 2x15 Standing Triceps extension 2x15 with GTB Ball circles on wall cw and ccw x 10 each Standing Theraband Row with  BTB 2x15 Red flexbar U/N's and twists 2 x 15 each Pulleys in flexion and scaption x 20 reps  Pt received R elbow flexion and extension PROM in supine per pt and tissue tolerance.  Pt received R shoulder flexion and scaption PROM in supine per pt and tissue tolerance.   6/30 UBE x 4.5 min alternating fwd/bwd  Elbow flexion 5# x15, 6# 2x10 Hammer curl 5# 2x15 Standing shoulder extension with BTB 2x15 Standing Triceps extension 3x15 with GTB Standing Theraband Row with BTB 2x15 Pulleys in flex and scaption x 20 each  Pt received R elbow flex and extension PROM in supine.  Pt received R shoulder flexion PROM in supine.   6/16 UBE x 4 min alternating fwd/bwd  Pt received R elbow flex and extension PROM. Pt received forearm pronation supination PROM in supine.  Elbow flexion 5# 2x15 Hammer curl 5# 2x15 Standing shoulder extension with BTB 2x15 Standing Triceps extension 2x15 with GTB Standing bilat shoulder ER with scap retraction with GTB x 10, RTB approx 12 Prone row 3# 3x10  Pt received gentle manual elbow extension stretch 3x10 sec    01/19/24 Pulleys abd Theraband row Black TB x30 Theraband shoulder extension Blue TB x30 Theraband triceps extension blue TB 2 x 15 Bilateral shoulder ER with scap retraction GTB 2 x 10 Standing  shoulder horizontal abduction GTB 2 x 10 Elbow flexion 5# 3x 15 Hammer curl 5# 3x 15 Prone row 3# 3x10 Prone shoulder extension 3# 3 x 10 Prone shoulder horizontal abduction 2# 3 x 10 Prone triceps extension 2# 3 x 10 Seated Pronation/supination with 3lb weight on roller 2 x 10ea  6/5  Elbow flexion 5# x30 Hammer curl 5# x30 Seated press out 3# 3x10 Prone triceps extension 3# x5 (decreased to 2#) x20 Prone row 3# 3x10 Theraband row Blue TB x30 Theraband shoulder extension GTB x30 Theraband triceps extension GTB x30 Pulleys abd Pronation/supination with 3lb weight on roller x20ea Wrist flex/ext 5# dumbbell x30ea UBE 4 min-  alternating fwd/back each minute   6/2 Pt received R elbow flex and extension PROM and forearm pronation and supination PROM in supine per pt and tissue tolerance. STM to biceps/ant deltoid Elbow flexion 5# 3x10 Hammer curl 5# 3x10 Plinth push ups x10 Seated press out 3# x15 Prone triceps extension 0# x15, 2# x30 Theraband row Blue TB x30 Theraband shoulder extension GTB x30 Theraband triceps extension GTB x30 Pulleys    Flex bar (red) U/Ns and twists x30ea UBE 4 min- alternating fwd/back each minute   PATIENT EDUCATION:  Education details: Exercise form, POC, HEP.   Person educated: Patient Education method: Explanation, Demonstration, verbal and tactile cues Education comprehension: verbalized understanding, returned demonstration, and needs further education, verbal and tactile cues required  HOME EXERCISE PROGRAM: Access Code: [email or web address] URL: [email or web address] Date: 11/22/2023 Prepared by: Gellen April Earnie Starring   ASSESSMENT:  CLINICAL IMPRESSION:  Pt has completed FCE and saw MD last week.  PT sent message to MD concerning work hardening and MD responded indicating that regular PT would be fine. Pt continues to report the same pain in his shoulder and elbow.  Pt had good form with standing elbow extension with GTB though had pain with exercise.  Pt also had elbow pain with ball circles on wall.  PT performed shoulder flexion and scaption PROM to improve shoulder mobility and ROM.  PT did limit volume of select exercises due to elbow pain.  Pt had no c/o's after Rx.          OBJECTIVE IMPAIRMENTS: decreased coordination, decreased mobility, decreased ROM, decreased strength, hypomobility, increased edema, increased fascial restrictions, increased muscle spasms, impaired flexibility, impaired UE functional use, improper body mechanics, postural dysfunction, and pain.   ACTIVITY LIMITATIONS: carrying, lifting, bathing, toileting, dressing, reach  over head, and hygiene/grooming  PARTICIPATION LIMITATIONS: meal prep, cleaning, laundry, driving, shopping, community activity, and occupation  PERSONAL FACTORS: Age, Fitness, Past/current experiences, and Time since onset of injury/illness/exacerbation are also affecting patient's functional outcome.   REHAB POTENTIAL: Good  CLINICAL DECISION MAKING: Evolving/moderate complexity  EVALUATION COMPLEXITY: Moderate   GOALS: Goals reviewed with patient? Yes  SHORT TERM GOALS: Target date: 12/13/2023  Pt will be ind with initial HEP Baseline: Goal status: INITIAL  2.  Pt will have improved elbow ROM to 10-135 deg Baseline:  Goal status:  GOAL MET  5/30  3.  Pt will have improved shoulder flexion and abd by at least 10 deg Baseline:  Goal status:  50% MET  5/27   LONG TERM GOALS: Target date: 02/09/2024   Pt will be ind with management and progression of HEP Baseline:  Goal status: PROGRESSING  2.  Pt will have improved elbow ROM to at least 5-140 deg Baseline:  Goal status: 60%  MET  5/30  3.  Pt will have full  and pain free shoulder elevation for overhead tasks Baseline:  Goal status: NOT MET  4.  Pt will be able to tolerate lifting and carrying at least 50# for work tasks Baseline:  Goal status: INITIAL  5.  Pt will have R UE strength to at least 90% of L UE strength for work tasks Baseline:  Goal status: PROGRESSING  5/30  6.  Pt will demo MCID on his Quick[employer] or UEFI Baseline:  Goal status: INITIAL   PLAN:  PT FREQUENCY: 2x/week  PT DURATION: 5 weeks  PLANNED INTERVENTIONS: 97164- PT Re-evaluation, 97110-Therapeutic exercises, 97530- Therapeutic activity, 97112- Neuromuscular re-education, 97535- Self Care, [id number]- Manual therapy, [id number]- Electrical stimulation (unattended), [phone]- Ionotophoresis 4mg /ml Dexamethasone , Patient/Family education, Taping, Dry Needling, Joint mobilization, Cryotherapy, and Moist heat  PLAN FOR NEXT SESSION:  Continue  elbow/shoulder ROM/strengthening    Leigh Minerva III PT, DPT 02/08/24 4:55 PM

## 2024-02-12 ENCOUNTER — Encounter (HOSPITAL_BASED_OUTPATIENT_CLINIC_OR_DEPARTMENT_OTHER): Admitting: Physical Therapy

## 2024-02-13 ENCOUNTER — Ambulatory Visit (HOSPITAL_BASED_OUTPATIENT_CLINIC_OR_DEPARTMENT_OTHER): Payer: Worker's Compensation | Attending: Family Medicine | Admitting: Physical Therapy

## 2024-02-13 DIAGNOSIS — M25511 Pain in right shoulder: Secondary | ICD-10-CM | POA: Insufficient documentation

## 2024-02-13 DIAGNOSIS — M6281 Muscle weakness (generalized): Secondary | ICD-10-CM | POA: Diagnosis present

## 2024-02-13 DIAGNOSIS — M25521 Pain in right elbow: Secondary | ICD-10-CM | POA: Diagnosis present

## 2024-02-13 DIAGNOSIS — M25621 Stiffness of right elbow, not elsewhere classified: Secondary | ICD-10-CM | POA: Diagnosis present

## 2024-02-13 NOTE — Therapy (Signed)
 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   Hypercholesterolemia    Hypertension    Metabolic acidosis    Recurrent peritonitis (HCC)    Type 1 diabetes with renal manifestations, controlled (HCC)    Vitamin D  deficiency    Past Surgical History:  Procedure Laterality Date   AV FISTULA PLACEMENT  01/10/2012   Procedure: ARTERIOVENOUS (AV) FISTULA CREATION;  Surgeon: Carlin FORBES Haddock, MD;  Location: [facility] OR;  Service: Vascular;  Laterality: Left;  Left Brachial Cephalic Arteriovenous Fistula   CAPD INSERTION  01/09/2012   Procedure: CONTINUOUS AMBULATORY PERITONEAL DIALYSIS  (CAPD) CATHETER INSERTION;  Surgeon: Elon CHRISTELLA Pacini, MD;  Location: [facility] OR;  Service: General;  Laterality: N/A;  Exteriorization PD Cath   CAPSULOTOMY Right 11/07/2023   Procedure: CAPSULOTOMY, ELBOW;  Surgeon: Genelle Standing, MD;  Location: [facility];  Service: Orthopedics;  Laterality: Right;  RIGHT ELBOW CAPSULAR RELEASE   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   received left kidney, 3 renal arteries, 1 renal vein, ureter and pancreas from 53 year old   HARDWARE REMOVAL Right 11/07/2023   Procedure: REMOVAL, HARDWARE;  Surgeon: Genelle Standing, MD;  Location: [facility];  Service: Orthopedics;  Laterality: Right;   INSERTION OF DIALYSIS CATHETER  01/13/2012   Procedure: INSERTION OF DIALYSIS CATHETER;  Surgeon: Krystal JULIANNA Doing, MD;  Location: [facility] OR;  Service: Vascular;  Laterality: Right;  Internal  Jugular   LAPAROSCOPY  03/13/2012   Procedure: LAPAROSCOPY DIAGNOSTIC;  Surgeon: Standing KYM Schultze, MD;  Location: [facility] OR;  Service: General;  Laterality: N/A;   LIVER BIOPSY  04/02/2013   open   ORIF HUMERUS FRACTURE Right 03/07/2023   Procedure: OPEN REDUCTION INTERNAL FIXATION (ORIF) DISTAL HUMERUS FRACTURE;  Surgeon: Genelle Standing, MD;  Location: [facility] ORS;  Service: Orthopedics;  Laterality: Right;   PERITONEAL CATHETER INSERTION     PERITONEAL CATHETER REMOVAL     SMALL INTESTINE SURGERY  04/02/2013   TRICEPS TENDON REPAIR  03/07/2023   Procedure: TRICEPS TENDON REPAIR;  Surgeon: Genelle Standing, MD;  Location: [facility] ORS;  Service: Orthopedics;;   ULNAR NERVE TRANSPOSITION Right 03/07/2023   Procedure: ULNAR NERVE EXPLORATION;  Surgeon: Genelle Standing, MD;  Location: [facility] ORS;  Service: Orthopedics;  Laterality: Right;   Patient Active Problem List   Diagnosis Date Noted   Hyperlipidemia LDL goal <70 11/13/2023   Closed fracture of right distal humerus 03/07/2023   Closed bicondylar fracture of distal end of right humerus 03/04/2023   Type 2 diabetes mellitus with diabetic chronic kidney disease (HCC) 07/22/2022   Depression, major, 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/2022   Erectile dysfunction 07/24/2017   Vitamin D  deficiency, unspecified 07/24/2017   Low back pain 02/25/2016   Chronic fatigue 01/25/2016   Diabetic peripheral neuropathy (HCC) 11/24/2015   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   Depression 11/02/2015   Tobacco abuse 11/02/2015   End stage renal disease (HCC) 12/29/2011   HTN (hypertension)  01/25/2011    PCP: [name], [name], MD  REFERRING PROVIDER: Genelle Standing, MD  REFERRING DIAG: [id number] (ICD-10-CM) - Other closed displaced fracture of distal end of right humerus, initial encounter  THERAPY DIAG:  No diagnosis found.  Rationale for Evaluation and Treatment: Rehabilitation  ONSET DATE: 11/07/23 R distal humeral elbow  hardware removal  SUBJECTIVE:   SUBJECTIVE STATEMENT: Pt is 14 weeks s/p hardware removal.   Pt states he had a little increased pain after prior Rx.  Pt continues to report pain with extending elbow.   Pt states his elbow was popping when cutting a watermelon though had no pain.  Pt states his lifestyle is nothing what it was compared to before injury.  Pt reports picking up objects is easier.  Pt reports improved performance of self care activities including dressing.      PERTINENT HISTORY: Per 11/16/23: Status post right distal humeral elbow hardware removal.  At this time he is progressing with range of motion.  I would like him to work with physical therapy to progress his flexion and extension.   PAIN:  Are you having pain? Yes: NPRS scale:  1/10 pain R shoulder and R elbow, 3/10 worst pain  Pain location: see above Pain description: sharp Aggravating factors:   Relieving factors:     PRECAUTIONS: None  RED FLAGS: None   WEIGHT BEARING RESTRICTIONS: No  FALLS:  Has patient fallen in last 6 months? 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  Pt wants full strength of his arm and perform act's without pain.  NEXT MD VISIT: 11/23/23 for suture removal  OBJECTIVE:  Note: Objective measures were completed at Evaluation unless otherwise noted.  DIAGNOSTIC FINDINGS: R elbow x-ray 10/29/23 IMPRESSION: 1. Status post ORIF of the distal humerus without evidence of hardware complication. 2. Several calcific densities along the palmar aspect of the elbow joint may reflect loose bodies.   COGNITION: Overall cognitive status: Within functional limits for tasks assessed      MUSCLE LENGTH: Biceps: ROM  below    UPPER EXTREMITY ROM:  Active ROM (measured in supine) Right eval Left eval Right 5/16 Right 5/27  Left 5/27 Right 5/30 Right 7/8 Left 7/8  Shoulder flexion 145*   148 with pain   143 with pain 149  Shoulder scaption       125   Shoulder abduction 85*   118 with pain 157  88 with pain 150  Shoulder adduction          Shoulder extension          Shoulder internal rotation 80         Shoulder external rotation 75         Elbow flexion 115 145 143 144   143   Elbow extension -20 3 -12/-10 AROM/PROM -15/-11 AROM/PROM  -10 AROM -10 AROM   Wrist flexion          Wrist extension          Wrist ulnar deviation          Wrist radial deviation          Wrist pronation      WFL bilat  Wrist supination      WFL bilat     (Blank rows = not tested, * = pain)  UPPER EXTREMITY MMT:  MMT Right eval Left eval Right 5/30 Comparison 5/30 Right 7/8 compcarison  Shoulder flexion 4       Shoulder extension        Shoulder abduction 4-       Shoulder adduction        Shoulder extension        Shoulder internal rotation 4-       Shoulder external rotation 3+       Middle trapezius        Lower trapezius        Elbow flexion (90 deg) 24.7, 23.6 38.3, 40.7 29.9, 29.5 R  73% of L 34 R 84% of L  Elbow extension (90 deg) 12.9, 9.2 21.9, 28.3 19.8 R 70% of L 20.5 with pain R 72% of L  Wrist flexion        Wrist extension        Wrist ulnar deviation        Wrist radial deviation        Wrist pronation   5/5     Wrist supination   5/5 with shoulder pain     Grip strength 35 lbs 55 lbs 49 lbs      (Blank rows = not tested)                                                                                                                                TREATMENT DATE:  7/8 UBE x 6 mins fwd/bwd lvl 1-2  Pt received R elbow flex and extension PROM and shoulder flexion PROM in supine.  Assessed elbow and shoulder ROM and elbow strength.  Ball circles on wall x 10 each cw, ccw,  up/down, s/s Standing triceps extension with GTB 2x15 Pulleys in flexion, scaption, and abd x 20 each  UEFI: Prior/Current:  43/80    7/2 UBE x 6 mins fwd/bwd lvl 1-2 Elbow flexion 5# x15, 6# 2x10 Hammer curl 5# 2x15 Standing Triceps extension 2x15 with GTB Ball circles on wall cw and ccw x 10 each Standing Theraband Row with BTB 2x15 Red flexbar U/N's and twists 2 x 15 each Pulleys in flexion and scaption x 20 reps  Pt received R elbow flexion and extension PROM in supine per pt and tissue tolerance.  Pt received R shoulder flexion and scaption PROM in supine per pt and tissue tolerance.   6/30 UBE x 4.5 min alternating fwd/bwd  Elbow flexion 5# x15, 6# 2x10 Hammer curl 5# 2x15 Standing shoulder extension with BTB 2x15 Standing Triceps extension 3x15 with GTB Standing Theraband Row with BTB 2x15 Pulleys in flex and scaption x 20 each  Pt received R elbow flex and extension PROM in supine.  Pt received R shoulder flexion PROM in supine.   6/16 UBE x 4  min alternating fwd/bwd  Pt received R elbow flex and extension PROM. Pt received forearm pronation supination PROM in supine.  Elbow flexion 5# 2x15 Hammer curl 5# 2x15 Standing shoulder extension with BTB 2x15 Standing Triceps extension 2x15 with GTB Standing bilat shoulder ER with scap retraction with GTB x 10, RTB approx 12 Prone row 3# 3x10  Pt received gentle manual elbow extension stretch 3x10 sec    01/19/24 Pulleys abd Theraband row Black TB x30 Theraband shoulder extension Blue TB x30 Theraband triceps extension blue TB 2 x 15 Bilateral shoulder ER with scap retraction GTB 2 x 10 Standing shoulder horizontal abduction GTB 2 x 10 Elbow flexion 5# 3x 15 Hammer curl 5# 3x 15 Prone row 3# 3x10 Prone shoulder extension 3# 3 x 10 Prone shoulder horizontal abduction 2# 3 x 10 Prone triceps extension 2# 3 x 10 Seated Pronation/supination with 3lb weight on roller 2 x 10ea  6/5  Elbow flexion 5#  x30 Hammer curl 5# x30 Seated press out 3# 3x10 Prone triceps extension 3# x5 (decreased to 2#) x20 Prone row 3# 3x10 Theraband row Blue TB x30 Theraband shoulder extension GTB x30 Theraband triceps extension GTB x30 Pulleys abd Pronation/supination with 3lb weight on roller x20ea Wrist flex/ext 5# dumbbell x30ea UBE 4 min- alternating fwd/back each minute   6/2 Pt received R elbow flex and extension PROM and forearm pronation and supination PROM in supine per pt and tissue tolerance. STM to biceps/ant deltoid Elbow flexion 5# 3x10 Hammer curl 5# 3x10 Plinth push ups x10 Seated press out 3# x15 Prone triceps extension 0# x15, 2# x30 Theraband row Blue TB x30 Theraband shoulder extension GTB x30 Theraband triceps extension GTB x30 Pulleys    Flex bar (red) U/Ns and twists x30ea UBE 4 min- alternating fwd/back each minute   PATIENT EDUCATION:  Education details: Exercise form, POC, HEP.   Person educated: Patient Education method: Explanation, Demonstration, verbal and tactile cues Education comprehension: verbalized understanding, returned demonstration, and needs further education, verbal and tactile cues required  HOME EXERCISE PROGRAM: Access Code: [email or web address] URL: [email or web address] Date: 11/22/2023 Prepared by: Gellen April Earnie Starring   ASSESSMENT:  CLINICAL IMPRESSION:  Pt has completed FCE and saw MD last week.  PT sent message to MD concerning work hardening and MD responded indicating that regular PT would be fine. Pt continues to report the same pain in his shoulder and elbow.  Pt had good form with standing elbow extension with GTB though had pain with exercise.  Pt also had elbow pain with ball circles on wall.  PT performed shoulder flexion and scaption PROM to improve shoulder mobility and ROM.  PT did limit volume of select exercises due to elbow pain.  Pt had no c/o's after Rx.          OBJECTIVE IMPAIRMENTS: decreased  coordination, decreased mobility, decreased ROM, decreased strength, hypomobility, increased edema, increased fascial restrictions, increased muscle spasms, impaired flexibility, impaired UE functional use, improper body mechanics, postural dysfunction, and pain.   ACTIVITY LIMITATIONS: carrying, lifting, bathing, toileting, dressing, reach over head, and hygiene/grooming  PARTICIPATION LIMITATIONS: meal prep, cleaning, laundry, driving, shopping, community activity, and occupation  PERSONAL FACTORS: Age, Fitness, Past/current experiences, and Time since onset of injury/illness/exacerbation are also affecting patient's functional outcome.   REHAB POTENTIAL: Good  CLINICAL DECISION MAKING: Evolving/moderate complexity  EVALUATION COMPLEXITY: Moderate   GOALS: Goals reviewed with patient? Yes  SHORT TERM GOALS: Target date: 12/13/2023  Pt will be ind with initial  HEP Baseline: Goal status: INITIAL  2.  Pt will have improved elbow ROM to 10-135 deg Baseline:  Goal status:  GOAL MET  5/30  3.  Pt will have improved shoulder flexion and abd by at least 10 deg Baseline:  Goal status:  50% MET  5/27   LONG TERM GOALS: Target date: 02/09/2024   Pt will be ind with management and progression of HEP Baseline:  Goal status: PROGRESSING  2.  Pt will have improved elbow ROM to at least 5-140 deg Baseline:  Goal status: 60%  MET  5/30  3.  Pt will have full and pain free shoulder elevation for overhead tasks Baseline:  Goal status: NOT MET  4.  Pt will be able to tolerate lifting and carrying at least 50# for work tasks Baseline:  Goal status: INITIAL  5.  Pt will have R UE strength to at least 90% of L UE strength for work tasks Baseline:  Goal status: PROGRESSING  5/30  6.  Pt will demo MCID on his Quick[employer] or UEFI Baseline:  Goal status: INITIAL   PLAN:  PT FREQUENCY: 2x/week  PT DURATION: 5 weeks  PLANNED INTERVENTIONS: 97164- PT Re-evaluation, 97110-Therapeutic  exercises, 97530- Therapeutic activity, 97112- Neuromuscular re-education, 97535- Self Care, [id number]- Manual therapy, [id number]- Electrical stimulation (unattended), [phone]- Ionotophoresis 4mg /ml Dexamethasone , Patient/Family education, Taping, Dry Needling, Joint mobilization, Cryotherapy, and Moist heat  PLAN FOR NEXT SESSION:  Continue elbow/shoulder ROM/strengthening    Leigh Minerva III PT, DPT 02/13/24 8:11 AM

## 2024-02-15 ENCOUNTER — Ambulatory Visit (HOSPITAL_BASED_OUTPATIENT_CLINIC_OR_DEPARTMENT_OTHER): Payer: Worker's Compensation

## 2024-02-15 ENCOUNTER — Encounter (HOSPITAL_BASED_OUTPATIENT_CLINIC_OR_DEPARTMENT_OTHER): Payer: Self-pay

## 2024-02-15 DIAGNOSIS — M25521 Pain in right elbow: Secondary | ICD-10-CM

## 2024-02-15 DIAGNOSIS — M25621 Stiffness of right elbow, not elsewhere classified: Secondary | ICD-10-CM | POA: Diagnosis not present

## 2024-02-15 DIAGNOSIS — M25511 Pain in right shoulder: Secondary | ICD-10-CM

## 2024-02-15 DIAGNOSIS — M6281 Muscle weakness (generalized): Secondary | ICD-10-CM

## 2024-02-15 NOTE — Addendum Note (Signed)
 Addended by: WOLFGANG CONLEY HERO on: 02/15/2024 10:29 AM   Modules accepted: Orders

## 2024-02-15 NOTE — Therapy (Signed)
 OUTPATIENT PHYSICAL THERAPY TREATMENT / PROGRESS NOTE    Patient Name: Mark Miranda MRN: [id number] DOB:1970-10-18, 53 y.o., male Today's Date: 02/15/2024  END OF SESSION:  PT End of Session - 02/15/24 0843     Visit Number 17    Number of Visits 23    Date for PT Re-Evaluation 03/19/24    Authorization Type Worker's Comp    PT Start Time [phone]    PT Stop Time 0930    PT Time Calculation (min) 43 min    Activity Tolerance Patient tolerated treatment well    Behavior During Therapy [facility] for tasks assessed/performed                     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   Hypercholesterolemia    Hypertension    Metabolic acidosis    Recurrent peritonitis (HCC)    Type 1 diabetes with renal manifestations, controlled (HCC)    Vitamin D  deficiency    Past Surgical History:  Procedure Laterality Date   AV FISTULA PLACEMENT  01/10/2012   Procedure: ARTERIOVENOUS (AV) FISTULA CREATION;  Surgeon: Carlin FORBES Haddock, MD;  Location: [facility] OR;  Service: Vascular;  Laterality: Left;  Left Brachial Cephalic Arteriovenous Fistula   CAPD INSERTION  01/09/2012   Procedure: CONTINUOUS AMBULATORY PERITONEAL DIALYSIS  (CAPD) CATHETER INSERTION;  Surgeon: Elon CHRISTELLA Pacini, MD;  Location: [facility] OR;  Service: General;  Laterality: N/A;  Exteriorization PD Cath   CAPSULOTOMY Right 11/07/2023   Procedure: CAPSULOTOMY, ELBOW;  Surgeon: Genelle Standing, MD;  Location: [facility];  Service: Orthopedics;  Laterality: Right;  RIGHT ELBOW CAPSULAR RELEASE   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   received left kidney, 3 renal arteries, 1 renal vein, ureter and pancreas  from 53 year old   HARDWARE REMOVAL Right 11/07/2023   Procedure: REMOVAL, HARDWARE;  Surgeon: Genelle Standing, MD;  Location: [facility];  Service: Orthopedics;  Laterality: Right;   INSERTION OF DIALYSIS CATHETER  01/13/2012   Procedure: INSERTION OF DIALYSIS CATHETER;  Surgeon: Krystal JULIANNA Doing, MD;  Location: [facility] OR;  Service: Vascular;  Laterality: Right;  Internal Jugular   LAPAROSCOPY  03/13/2012   Procedure: LAPAROSCOPY DIAGNOSTIC;  Surgeon: Standing KYM Schultze, MD;  Location: [facility] OR;  Service: General;  Laterality: N/A;   LIVER BIOPSY  04/02/2013   open   ORIF HUMERUS FRACTURE Right 03/07/2023   Procedure: OPEN REDUCTION INTERNAL FIXATION (ORIF) DISTAL HUMERUS FRACTURE;  Surgeon: Genelle Standing, MD;  Location: [facility] ORS;  Service: Orthopedics;  Laterality: Right;   PERITONEAL CATHETER INSERTION     PERITONEAL CATHETER REMOVAL     SMALL INTESTINE SURGERY  04/02/2013   TRICEPS TENDON REPAIR  03/07/2023   Procedure: TRICEPS TENDON REPAIR;  Surgeon: Genelle Standing, MD;  Location: [facility] ORS;  Service: Orthopedics;;   ULNAR NERVE TRANSPOSITION Right 03/07/2023   Procedure: ULNAR NERVE EXPLORATION;  Surgeon: Genelle Standing, MD;  Location: [facility] ORS;  Service: Orthopedics;  Laterality: Right;   Patient Active Problem List   Diagnosis Date Noted   Hyperlipidemia LDL goal <70 11/13/2023   Closed fracture of right distal humerus 03/07/2023   Closed bicondylar fracture of  distal end of right humerus 03/04/2023   Type 2 diabetes mellitus with diabetic chronic kidney disease (HCC) 07/22/2022   Depression, major, 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/2022   Erectile dysfunction 07/24/2017   Vitamin D  deficiency, unspecified 07/24/2017   Low back pain 02/25/2016   Chronic fatigue 01/25/2016   Diabetic peripheral neuropathy (HCC) 11/24/2015   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   Depression 11/02/2015   Tobacco abuse 11/02/2015   End stage renal disease (HCC)  12/29/2011   HTN (hypertension) 01/25/2011    PCP: [name], [name], MD  REFERRING PROVIDER: Genelle Standing, MD  REFERRING DIAG: [phone] (ICD-10-CM) - Other closed displaced fracture of distal end of right humerus, initial encounter  THERAPY DIAG:  Stiffness of right elbow, not elsewhere classified  Muscle weakness (generalized)  Pain in right elbow  Right shoulder pain, unspecified chronicity  Rationale for Evaluation and Treatment: Rehabilitation  ONSET DATE: 11/07/23 R distal humeral elbow hardware removal  SUBJECTIVE:   SUBJECTIVE STATEMENT: Pt reports ongoing pain in shoulder and elbow. Shoulder feels worse than elbow. Had temporary relief from injection last month, but reports no improvement currently. 1/10 pain level at rest. Hurts more with activity.     PERTINENT HISTORY: Per 11/16/23: Status post right distal humeral elbow hardware removal.  At this time he is progressing with range of motion.  I would like him to work with physical therapy to progress his flexion and extension.   PAIN:  Are you having pain? Yes: NPRS scale:  1/10 pain R shoulder and R elbow, 3/10 worst pain  Pain location: see above Pain description: sharp Aggravating factors:   Relieving factors:     PRECAUTIONS: None  RED FLAGS: None   WEIGHT BEARING RESTRICTIONS: No  FALLS:  Has patient fallen in last 6 months? 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  Pt wants full strength of his arm and perform act's without pain.  NEXT MD VISIT: 11/23/23 for suture removal  OBJECTIVE:  Note: Objective measures were completed at Evaluation unless otherwise noted.  DIAGNOSTIC FINDINGS: R elbow x-ray 10/29/23 IMPRESSION: 1.  Status post ORIF of the distal humerus without evidence of hardware complication. 2. Several calcific densities along the palmar aspect of the elbow joint may reflect loose bodies.   COGNITION: Overall cognitive status: Within functional limits for tasks assessed      MUSCLE LENGTH: Biceps: ROM below    UPPER EXTREMITY ROM:  Active ROM (measured in supine) Right eval Left eval Right 5/16 Right 5/27  Left 5/27 Right 5/30 Right 7/8 Left 7/8  Shoulder flexion 145*   148 with pain   143 with pain 149  Shoulder scaption       125   Shoulder abduction 85*   118 with pain 157  88 with pain 150  Shoulder adduction          Shoulder extension          Shoulder internal rotation 80         Shoulder external rotation 75         Elbow flexion 115 145 143 144   143   Elbow extension -20 3 -12/-10 AROM/PROM -15/-11 AROM/PROM  -  10 AROM -10 AROM   Wrist flexion          Wrist extension          Wrist ulnar deviation          Wrist radial deviation          Wrist pronation      WFL bilat    Wrist supination      WFL bilat     (Blank rows = not tested, * = pain)  UPPER EXTREMITY MMT:  MMT Right eval Left eval Right 5/30 Comparison 5/30 Right 7/8 compcarison  Shoulder flexion 4       Shoulder extension        Shoulder abduction 4-       Shoulder adduction        Shoulder extension        Shoulder internal rotation 4-       Shoulder external rotation 3+       Middle trapezius        Lower trapezius        Elbow flexion (90 deg) 24.7, 23.6 38.3, 40.7 29.9, 29.5 R  73% of L 34 R 84% of L  Elbow extension (90 deg) 12.9, 9.2 21.9, 28.3 19.8 R 70% of L 20.5 with pain R 72% of L  Wrist flexion        Wrist extension        Wrist ulnar deviation        Wrist radial deviation        Wrist pronation   5/5     Wrist supination   5/5 with shoulder pain     Grip strength 35 lbs 55 lbs 49 lbs      (Blank rows = not tested)                                                                                                                                 TREATMENT DATE:   7/10 UBE x 4 min L4 (fwd/back each) R shoulder PROM Supine rhythmic stabilization R shoulder Modified push up (on knees) x5 Theraband row Black TB x30 Theraband shoulder extension Blue TB x30 Theraband triceps extension blue TB 2 x 15 Bilateral shoulder ER with scap retraction GTB 2 x 10 Standing shoulder horizontal abduction GTB x10 (had pain so stopped)  Elbow flexion 5# 3x 15 Hammer curl 5# 3x 15 Prone row 8# 3x10 Prone shoulder extension 3# 3 x 10 Prone shoulder horizontal abduction 0# x10 (had shoulder pain so stopped) Prone triceps extension 5# 3 x 10     7/8 UBE x 6 mins fwd/bwd lvl 1-2  Pt received R elbow flex and extension PROM and shoulder flexion PROM in supine.  Assessed elbow and shoulder ROM and elbow strength.  Ball circles on wall x 10 each cw, ccw, up/down, s/s Standing triceps extension with GTB 2x15 Pulleys in flexion, scaption, and abd x  20 each  UEFI: Prior/Current:  43/80 / 42/80    PATIENT EDUCATION:  Education details: Exercise form, objective findings, progress, POC, HEP.   Person educated: Patient Education method: Explanation, Demonstration, verbal and tactile cues Education comprehension: verbalized understanding, returned demonstration, and needs further education, verbal and tactile cues required  HOME EXERCISE PROGRAM: Access Code: [email or web address] URL: [email or web address] Date: 11/22/2023 Prepared by: Gellen April Earnie Starring   ASSESSMENT:  CLINICAL IMPRESSION:  Trialed modified push up today to assess WB tolerance in this position. He did report mild discomfort in shoulder which worsened with increased reps, so stopped. He also was unable to complete prone horizontal abduction with resistance today due to c/o difficulty and pain in L shoulder. No significant complaints with elbow strengthening progressions today. Will continue  to monitor shoulder discomfort as we progress strengthening.   OBJECTIVE IMPAIRMENTS: decreased coordination, decreased mobility, decreased ROM, decreased strength, hypomobility, increased edema, increased fascial restrictions, increased muscle spasms, impaired flexibility, impaired UE functional use, improper body mechanics, postural dysfunction, and pain.   ACTIVITY LIMITATIONS: carrying, lifting, bathing, toileting, dressing, reach over head, and hygiene/grooming  PARTICIPATION LIMITATIONS: meal prep, cleaning, laundry, driving, shopping, community activity, and occupation  PERSONAL FACTORS: Age, Fitness, Past/current experiences, and Time since onset of injury/illness/exacerbation are also affecting patient's functional outcome.   REHAB POTENTIAL: Good  CLINICAL DECISION MAKING: Evolving/moderate complexity  EVALUATION COMPLEXITY: Moderate   GOALS: Goals reviewed with patient? Yes  SHORT TERM GOALS: Target date: 12/13/2023  Pt will be ind with initial HEP Baseline: Goal status: ONGOING  2.  Pt will have improved elbow ROM to 10-135 deg Baseline:  Goal status:  GOAL MET  5/30  3.  Pt will have improved shoulder flexion and abd by at least 10 deg Baseline:  Goal status:  NOT MET     LONG TERM GOALS: Target date: 03/19/2024   Pt will be ind with management and progression of HEP Baseline:  Goal status: PROGRESSING  2.  Pt will have improved elbow ROM to at least 5-140 deg Baseline:  Goal status: 80%  MET  5/30  3.  Pt will have full and pain free shoulder elevation for overhead tasks Baseline:  Goal status: NOT MET  4.  Pt will be able to tolerate lifting and carrying at least 50# for work tasks Baseline:  Goal status: INITIAL  5.  Pt will have R UE strength to at least 90% of L UE strength for work tasks Baseline:  Goal status: PROGRESSING  7/8  6.  Pt will demo MCID on his [employer] or UEFI Baseline:  Goal status: NOT MET   PLAN:  PT FREQUENCY:  1-2x/wk  PT DURATION: 4-5 weeks  PLANNED INTERVENTIONS: 02835- PT Re-evaluation, 97110-Therapeutic exercises, 97530- Therapeutic activity, 97112- Neuromuscular re-education, 97535- Self Care, 02859- Manual therapy, [id number]- Electrical stimulation (unattended), [id number]- Ionotophoresis 4mg /ml Dexamethasone , Patient/Family education, Taping, Dry Needling, Joint mobilization, Cryotherapy, and Moist heat  PLAN FOR NEXT SESSION:  Continue elbow/shoulder ROM/strengthening    Asberry Rodes, PTA  02/15/24 10:03 AM

## 2024-02-19 ENCOUNTER — Encounter (HOSPITAL_BASED_OUTPATIENT_CLINIC_OR_DEPARTMENT_OTHER): Payer: Self-pay | Admitting: Physical Therapy

## 2024-02-19 ENCOUNTER — Ambulatory Visit (HOSPITAL_BASED_OUTPATIENT_CLINIC_OR_DEPARTMENT_OTHER): Payer: Worker's Compensation | Admitting: Physical Therapy

## 2024-02-19 DIAGNOSIS — M25511 Pain in right shoulder: Secondary | ICD-10-CM

## 2024-02-19 DIAGNOSIS — M25521 Pain in right elbow: Secondary | ICD-10-CM

## 2024-02-19 DIAGNOSIS — M25621 Stiffness of right elbow, not elsewhere classified: Secondary | ICD-10-CM | POA: Diagnosis not present

## 2024-02-19 DIAGNOSIS — M6281 Muscle weakness (generalized): Secondary | ICD-10-CM

## 2024-02-19 NOTE — Therapy (Signed)
 OUTPATIENT PHYSICAL THERAPY TREATMENT     Patient Name: Mark Miranda MRN: [id number] DOB:[birth year], 53 y.o., male Today's Date: 02/19/2024  END OF SESSION:  PT End of Session - 02/19/24 0857     Visit Number 18    Number of Visits 23    Date for PT Re-Evaluation 03/19/24    Authorization Type Worker's Comp    PT Start Time 0854    PT Stop Time 0938    PT Time Calculation (min) 44 min    Activity Tolerance Patient tolerated treatment well    Behavior During Therapy [facility] for tasks assessed/performed                      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   Hypercholesterolemia    Hypertension    Metabolic acidosis    Recurrent peritonitis (HCC)    Type 1 diabetes with renal manifestations, controlled (HCC)    Vitamin D  deficiency    Past Surgical History:  Procedure Laterality Date   AV FISTULA PLACEMENT  01/10/2012   Procedure: ARTERIOVENOUS (AV) FISTULA CREATION;  Surgeon: Carlin FORBES Haddock, MD;  Location: [facility] OR;  Service: Vascular;  Laterality: Left;  Left Brachial Cephalic Arteriovenous Fistula   CAPD INSERTION  01/09/2012   Procedure: CONTINUOUS AMBULATORY PERITONEAL DIALYSIS  (CAPD) CATHETER INSERTION;  Surgeon: Elon CHRISTELLA Pacini, MD;  Location: [facility] OR;  Service: General;  Laterality: N/A;  Exteriorization PD Cath   CAPSULOTOMY Right 11/07/2023   Procedure: CAPSULOTOMY, ELBOW;  Surgeon: Genelle Standing, MD;  Location: [facility];  Service: Orthopedics;  Laterality: Right;  RIGHT ELBOW CAPSULAR RELEASE   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   received left kidney, 3 renal arteries, 1 renal vein, ureter and pancreas from 53 year  old   HARDWARE REMOVAL Right 11/07/2023   Procedure: REMOVAL, HARDWARE;  Surgeon: Genelle Standing, MD;  Location: [facility];  Service: Orthopedics;  Laterality: Right;   INSERTION OF DIALYSIS CATHETER  01/13/2012   Procedure: INSERTION OF DIALYSIS CATHETER;  Surgeon: Krystal JULIANNA Doing, MD;  Location: [facility] OR;  Service: Vascular;  Laterality: Right;  Internal Jugular   LAPAROSCOPY  03/13/2012   Procedure: LAPAROSCOPY DIAGNOSTIC;  Surgeon: Standing KYM Schultze, MD;  Location: [facility] OR;  Service: General;  Laterality: N/A;   LIVER BIOPSY  04/02/2013   open   ORIF HUMERUS FRACTURE Right 03/07/2023   Procedure: OPEN REDUCTION INTERNAL FIXATION (ORIF) DISTAL HUMERUS FRACTURE;  Surgeon: Genelle Standing, MD;  Location: [facility] ORS;  Service: Orthopedics;  Laterality: Right;   PERITONEAL CATHETER INSERTION     PERITONEAL CATHETER REMOVAL     SMALL INTESTINE SURGERY  04/02/2013   TRICEPS TENDON REPAIR  03/07/2023   Procedure: TRICEPS TENDON REPAIR;  Surgeon: Genelle Standing, MD;  Location: [facility] ORS;  Service: Orthopedics;;   ULNAR NERVE TRANSPOSITION Right 03/07/2023   Procedure: ULNAR NERVE EXPLORATION;  Surgeon: Genelle Standing, MD;  Location: [facility] ORS;  Service: Orthopedics;  Laterality: Right;   Patient Active Problem List   Diagnosis Date Noted   Hyperlipidemia LDL goal <70 11/13/2023   Closed fracture of right distal humerus 03/07/2023   Closed bicondylar fracture of distal  end of right humerus 03/04/2023   Type 2 diabetes mellitus with diabetic chronic kidney disease (HCC) 07/22/2022   Depression, major, 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/2022   Erectile dysfunction 07/24/2017   Vitamin D  deficiency, unspecified 07/24/2017   Low back pain 02/25/2016   Chronic fatigue 01/25/2016   Diabetic peripheral neuropathy (HCC) 11/24/2015   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   Depression 11/02/2015   Tobacco abuse 11/02/2015   End stage renal disease (HCC) 12/29/2011    HTN (hypertension) 01/25/2011    PCP: [name], [name], MD  REFERRING PROVIDER: Genelle Standing, MD  REFERRING DIAG: [phone] (ICD-10-CM) - Other closed displaced fracture of distal end of right humerus, initial encounter  THERAPY DIAG:  Stiffness of right elbow, not elsewhere classified  Muscle weakness (generalized)  Pain in right elbow  Right shoulder pain, unspecified chronicity  Rationale for Evaluation and Treatment: Rehabilitation  ONSET DATE: 11/07/23 R distal humeral elbow hardware removal  SUBJECTIVE:   SUBJECTIVE STATEMENT: Pt reports his elbow is hurting more.  He noticed more pain when he was washing dishes and showering.  He noticed more sharp pain last night.  He reports no pain in his shoulder if he is not moving it.  Pt reports having a little bit more pain after prior Rx.  Pt states the pain has been worse since PT has progressed strengthening.       PERTINENT HISTORY: Per 11/16/23: Status post right distal humeral elbow hardware removal.  At this time he is progressing with range of motion.  I would like him to work with physical therapy to progress his flexion and extension.   PAIN:  Are you having pain? Yes: NPRS scale:  2/10 R elbow, no shoulder pain at rest  Pain location: see above Pain description: sharp Aggravating factors:   Relieving factors:     PRECAUTIONS: None  RED FLAGS: None   WEIGHT BEARING RESTRICTIONS: No  FALLS:  Has patient fallen in last 6 months? 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  Pt wants full strength of his arm and perform act's without pain.  NEXT MD VISIT: 11/23/23 for suture removal  OBJECTIVE:  Note: Objective measures were completed at  Evaluation unless otherwise noted.  DIAGNOSTIC FINDINGS: R elbow x-ray 10/29/23 IMPRESSION: 1. Status post ORIF of the distal humerus without evidence of hardware complication. 2. Several calcific densities along the palmar aspect of the elbow joint may reflect loose bodies.   COGNITION: Overall cognitive status: Within functional limits for tasks assessed      MUSCLE LENGTH: Biceps: ROM below    UPPER EXTREMITY ROM:  Active ROM (measured in supine) Right eval Left eval Right 5/16 Right 5/27  Left 5/27 Right 5/30 Right 7/8 Left 7/8  Shoulder flexion 145*   148 with pain   143 with pain 149  Shoulder scaption       125   Shoulder abduction 85*   118 with pain 157  88 with pain 150  Shoulder adduction          Shoulder extension          Shoulder internal rotation 80         Shoulder  external rotation 75         Elbow flexion 115 145 143 144   143   Elbow extension -20 3 -12/-10 AROM/PROM -15/-11 AROM/PROM  -10 AROM -10 AROM   Wrist flexion          Wrist extension          Wrist ulnar deviation          Wrist radial deviation          Wrist pronation      WFL bilat    Wrist supination      WFL bilat     (Blank rows = not tested, * = pain)  UPPER EXTREMITY MMT:  MMT Right eval Left eval Right 5/30 Comparison 5/30 Right 7/8 compcarison  Shoulder flexion 4       Shoulder extension        Shoulder abduction 4-       Shoulder adduction        Shoulder extension        Shoulder internal rotation 4-       Shoulder external rotation 3+       Middle trapezius        Lower trapezius        Elbow flexion (90 deg) 24.7, 23.6 38.3, 40.7 29.9, 29.5 R  73% of L 34 R 84% of L  Elbow extension (90 deg) 12.9, 9.2 21.9, 28.3 19.8 R 70% of L 20.5 with pain R 72% of L  Wrist flexion        Wrist extension        Wrist ulnar deviation        Wrist radial deviation        Wrist pronation   5/5     Wrist supination   5/5 with shoulder pain     Grip strength 35 lbs 55 lbs  49 lbs      (Blank rows = not tested)                                                                                                                                TREATMENT DATE:   7/14 UBE x 3.5 mins lvl 2  Pt received R UE PROM including elbow flexion/extension, forearm pronation/supination, and shoulder ER, flexion, and abduction in supine.   Supine rhythmic stab's 3x30 sec at 90 deg shoulder flexion  S/L ER 2x10 Prone row 3# 2x12 Prone shoulder extension 2# 3x10-12 4D ball rolls on wall approx 15 each Attempted Standing Triceps extension with GTB though stopped due to pain Prone triceps extension 3# x 10, 4#  2x10 Pulleys in flexion abduction x 20 each   7/10 UBE x 4 min L4 (fwd/back each) R shoulder PROM Supine rhythmic stabilization R shoulder Modified push up (on knees) x5 Theraband row Black TB x30 Theraband shoulder extension Blue TB x30 Theraband triceps extension blue TB 2 x 15 Bilateral shoulder  ER with scap retraction GTB 2 x 10 Standing shoulder horizontal abduction GTB x10 (had pain so stopped)  Elbow flexion 5# 3x 15 Hammer curl 5# 3x 15 Prone row 8# 3x10 Prone shoulder extension 3# 3 x 10 Prone shoulder horizontal abduction 0# x10 (had shoulder pain so stopped) Prone triceps extension 5# 3 x 10     7/8 UBE x 6 mins fwd/bwd lvl 1-2  Pt received R elbow flex and extension PROM and shoulder flexion PROM in supine.  Assessed elbow and shoulder ROM and elbow strength.  Ball circles on wall x 10 each cw, ccw, up/down, s/s Standing triceps extension with GTB 2x15 Pulleys in flexion, scaption, and abd x 20 each  UEFI: Prior/Current:  43/80 / 42/80    PATIENT EDUCATION:  Education details: Exercise form, objective findings, progress, POC, HEP.   Person educated: Patient Education method: Explanation, Demonstration, verbal and tactile cues Education comprehension: verbalized understanding, returned demonstration, and needs further education,  verbal and tactile cues required  HOME EXERCISE PROGRAM: Access Code: [email or web address] URL: [email or web address] Date: 11/22/2023 Prepared by: Gellen April Earnie Starring   ASSESSMENT:  CLINICAL IMPRESSION:  Pt presents to Rx reporting having increased pain in elbow.  He noticed more pain when he was washing dishes and showering last night.  He had elbow pain with UBE today.  Pt attempted standing triceps extension with GTB though stopped due to pain.  He had better tolerance with prone triceps extension than standing elbow extension.  Pt demonstrated improved performance of 4D ball rolls having increased ease with exercise.  PT limited certain exercises and resistance due to pt's pain.  PT performed shoulder and elbow PROM to improve ROM and decrease tightness and stiffness.  Pt reports increased pain from 2/10 before Rx to almost a 3/10 after Rx.   OBJECTIVE IMPAIRMENTS: decreased coordination, decreased mobility, decreased ROM, decreased strength, hypomobility, increased edema, increased fascial restrictions, increased muscle spasms, impaired flexibility, impaired UE functional use, improper body mechanics, postural dysfunction, and pain.   ACTIVITY LIMITATIONS: carrying, lifting, bathing, toileting, dressing, reach over head, and hygiene/grooming  PARTICIPATION LIMITATIONS: meal prep, cleaning, laundry, driving, shopping, community activity, and occupation  PERSONAL FACTORS: Age, Fitness, Past/current experiences, and Time since onset of injury/illness/exacerbation are also affecting patient's functional outcome.   REHAB POTENTIAL: Good  CLINICAL DECISION MAKING: Evolving/moderate complexity  EVALUATION COMPLEXITY: Moderate   GOALS: Goals reviewed with patient? Yes  SHORT TERM GOALS: Target date: 12/13/2023  Pt will be ind with initial HEP Baseline: Goal status: ONGOING  2.  Pt will have improved elbow ROM to 10-135 deg Baseline:  Goal status:  GOAL MET  5/30  3.   Pt will have improved shoulder flexion and abd by at least 10 deg Baseline:  Goal status:  NOT MET     LONG TERM GOALS: Target date: 03/19/2024   Pt will be ind with management and progression of HEP Baseline:  Goal status: PROGRESSING  2.  Pt will have improved elbow ROM to at least 5-140 deg Baseline:  Goal status: 80%  MET  5/30  3.  Pt will have full and pain free shoulder elevation for overhead tasks Baseline:  Goal status: NOT MET  4.  Pt will be able to tolerate lifting and carrying at least 50# for work tasks Baseline:  Goal status: INITIAL  5.  Pt will have R UE strength to at least 90% of L UE strength for work tasks Baseline:  Goal status: PROGRESSING  7/8  6.  Pt will demo MCID on his [employer] or UEFI Baseline:  Goal status: NOT MET   PLAN:  PT FREQUENCY: 1-2x/wk  PT DURATION: 4-5 weeks  PLANNED INTERVENTIONS: 02835- PT Re-evaluation, 97110-Therapeutic exercises, 97530- Therapeutic activity, 97112- Neuromuscular re-education, 97535- Self Care, 02859- Manual therapy, [id number]- Electrical stimulation (unattended), [phone]- Ionotophoresis 4mg /ml Dexamethasone , Patient/Family education, Taping, Dry Needling, Joint mobilization, Cryotherapy, and Moist heat  PLAN FOR NEXT SESSION:  Continue elbow/shoulder ROM/strengthening    Leigh Minerva III PT, DPT 02/19/24 3:25 PM

## 2024-02-21 ENCOUNTER — Ambulatory Visit (HOSPITAL_BASED_OUTPATIENT_CLINIC_OR_DEPARTMENT_OTHER): Payer: Worker's Compensation

## 2024-02-21 ENCOUNTER — Encounter (HOSPITAL_BASED_OUTPATIENT_CLINIC_OR_DEPARTMENT_OTHER): Payer: Self-pay

## 2024-02-21 DIAGNOSIS — M6281 Muscle weakness (generalized): Secondary | ICD-10-CM

## 2024-02-21 DIAGNOSIS — M25621 Stiffness of right elbow, not elsewhere classified: Secondary | ICD-10-CM

## 2024-02-21 DIAGNOSIS — M25521 Pain in right elbow: Secondary | ICD-10-CM

## 2024-02-21 DIAGNOSIS — M25511 Pain in right shoulder: Secondary | ICD-10-CM

## 2024-02-21 NOTE — Therapy (Signed)
 OUTPATIENT PHYSICAL THERAPY TREATMENT     Patient Name: Mark Miranda MRN: [id number] DOB:Dec 12, 1970, 53 y.o., male Today's Date: 02/21/2024  END OF SESSION:  PT End of Session - 02/21/24 0838     Visit Number 19    Number of Visits 23    Date for PT Re-Evaluation 03/19/24    Authorization Type Worker's Comp    PT Start Time 0847    PT Stop Time 0927    PT Time Calculation (min) 40 min    Activity Tolerance Patient tolerated treatment well    Behavior During Therapy [facility] for tasks assessed/performed                       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   Hypercholesterolemia    Hypertension    Metabolic acidosis    Recurrent peritonitis (HCC)    Type 1 diabetes with renal manifestations, controlled (HCC)    Vitamin D  deficiency    Past Surgical History:  Procedure Laterality Date   AV FISTULA PLACEMENT  01/10/2012   Procedure: ARTERIOVENOUS (AV) FISTULA CREATION;  Surgeon: Carlin FORBES Haddock, MD;  Location: [facility] OR;  Service: Vascular;  Laterality: Left;  Left Brachial Cephalic Arteriovenous Fistula   CAPD INSERTION  01/09/2012   Procedure: CONTINUOUS AMBULATORY PERITONEAL DIALYSIS  (CAPD) CATHETER INSERTION;  Surgeon: Elon CHRISTELLA Pacini, MD;  Location: [facility] OR;  Service: General;  Laterality: N/A;  Exteriorization PD Cath   CAPSULOTOMY Right 11/07/2023   Procedure: CAPSULOTOMY, ELBOW;  Surgeon: Genelle Standing, MD;  Location: [facility];  Service: Orthopedics;  Laterality: Right;  RIGHT ELBOW CAPSULAR RELEASE   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   received left kidney, 3 renal arteries, 1 renal vein, ureter and pancreas from 53 year  old   HARDWARE REMOVAL Right 11/07/2023   Procedure: REMOVAL, HARDWARE;  Surgeon: Genelle Standing, MD;  Location: [facility];  Service: Orthopedics;  Laterality: Right;   INSERTION OF DIALYSIS CATHETER  01/13/2012   Procedure: INSERTION OF DIALYSIS CATHETER;  Surgeon: Krystal JULIANNA Doing, MD;  Location: [facility] OR;  Service: Vascular;  Laterality: Right;  Internal Jugular   LAPAROSCOPY  03/13/2012   Procedure: LAPAROSCOPY DIAGNOSTIC;  Surgeon: Standing KYM Schultze, MD;  Location: [facility] OR;  Service: General;  Laterality: N/A;   LIVER BIOPSY  04/02/2013   open   ORIF HUMERUS FRACTURE Right 03/07/2023   Procedure: OPEN REDUCTION INTERNAL FIXATION (ORIF) DISTAL HUMERUS FRACTURE;  Surgeon: Genelle Standing, MD;  Location: [facility] ORS;  Service: Orthopedics;  Laterality: Right;   PERITONEAL CATHETER INSERTION     PERITONEAL CATHETER REMOVAL     SMALL INTESTINE SURGERY  04/02/2013   TRICEPS TENDON REPAIR  03/07/2023   Procedure: TRICEPS TENDON REPAIR;  Surgeon: Genelle Standing, MD;  Location: [facility] ORS;  Service: Orthopedics;;   ULNAR NERVE TRANSPOSITION Right 03/07/2023   Procedure: ULNAR NERVE EXPLORATION;  Surgeon: Genelle Standing, MD;  Location: [facility] ORS;  Service: Orthopedics;  Laterality: Right;   Patient Active Problem List   Diagnosis Date Noted   Hyperlipidemia LDL goal <70 11/13/2023   Closed fracture of right distal humerus 03/07/2023   Closed bicondylar fracture of  distal end of right humerus 03/04/2023   Type 2 diabetes mellitus with diabetic chronic kidney disease (HCC) 07/22/2022   Depression, major, 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/2022   Erectile dysfunction 07/24/2017   Vitamin D  deficiency, unspecified 07/24/2017   Low back pain 02/25/2016   Chronic fatigue 01/25/2016   Diabetic peripheral neuropathy (HCC) 11/24/2015   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   Depression 11/02/2015   Tobacco abuse 11/02/2015   End stage renal disease (HCC) 12/29/2011    HTN (hypertension) 01/25/2011    PCP: [name], [name], MD  REFERRING PROVIDER: Genelle Standing, MD  REFERRING DIAG: [phone] (ICD-10-CM) - Other closed displaced fracture of distal end of right humerus, initial encounter  THERAPY DIAG:  Stiffness of right elbow, not elsewhere classified  Muscle weakness (generalized)  Pain in right elbow  Right shoulder pain, unspecified chronicity  Rationale for Evaluation and Treatment: Rehabilitation  ONSET DATE: 11/07/23 R distal humeral elbow hardware removal  SUBJECTIVE:   SUBJECTIVE STATEMENT: Pt reports ongoing elbow pain since last week. He has not tried icing it. States resisted triceps extension makes it worse. Also hurts with bending elbow unresisted.     PERTINENT HISTORY: Per 11/16/23: Status post right distal humeral elbow hardware removal.  At this time he is progressing with range of motion.  I would like him to work with physical therapy to progress his flexion and extension.   PAIN:  Are you having pain? Yes: NPRS scale:  2-3/10 R elbow, no shoulder pain at rest  Pain location: see above Pain description: sharp Aggravating factors:   Relieving factors:     PRECAUTIONS: None  RED FLAGS: None   WEIGHT BEARING RESTRICTIONS: No  FALLS:  Has patient fallen in last 6 months? 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  Pt wants full strength of his arm and perform act's without pain.  NEXT MD VISIT: 11/23/23 for suture removal  OBJECTIVE:  Note: Objective measures were completed at Evaluation unless otherwise noted.  DIAGNOSTIC FINDINGS: R elbow x-ray 10/29/23 IMPRESSION: 1. Status post ORIF of the distal humerus without evidence of hardware  complication. 2. Several calcific densities along the palmar aspect of the elbow joint may reflect loose bodies.   COGNITION: Overall cognitive status: Within functional limits for tasks assessed      MUSCLE LENGTH: Biceps: ROM below    UPPER EXTREMITY ROM:  Active ROM (measured in supine) Right eval Left eval Right 5/16 Right 5/27  Left 5/27 Right 5/30 Right 7/8 Left 7/8  Shoulder flexion 145*   148 with pain   143 with pain 149  Shoulder scaption       125   Shoulder abduction 85*   118 with pain 157  88 with pain 150  Shoulder adduction          Shoulder extension          Shoulder internal rotation 80         Shoulder external rotation 75         Elbow flexion 115 145 143 144   143   Elbow extension -20 3 -12/-10 AROM/PROM -15/-11 AROM/PROM  -10 AROM -10 AROM   Wrist flexion  Wrist extension          Wrist ulnar deviation          Wrist radial deviation          Wrist pronation      WFL bilat    Wrist supination      WFL bilat     (Blank rows = not tested, * = pain)  UPPER EXTREMITY MMT:  MMT Right eval Left eval Right 5/30 Comparison 5/30 Right 7/8 compcarison  Shoulder flexion 4       Shoulder extension        Shoulder abduction 4-       Shoulder adduction        Shoulder extension        Shoulder internal rotation 4-       Shoulder external rotation 3+       Middle trapezius        Lower trapezius        Elbow flexion (90 deg) 24.7, 23.6 38.3, 40.7 29.9, 29.5 R  73% of L 34 R 84% of L  Elbow extension (90 deg) 12.9, 9.2 21.9, 28.3 19.8 R 70% of L 20.5 with pain R 72% of L  Wrist flexion        Wrist extension        Wrist ulnar deviation        Wrist radial deviation        Wrist pronation   5/5     Wrist supination   5/5 with shoulder pain     Grip strength 35 lbs 55 lbs 49 lbs      (Blank rows = not tested)                                                                                                                                 TREATMENT DATE:   7/16 UBE x 4 min L2 (fwd/back each) R shoulder PROM, R elbow PROM Supine rhythmic stabilization R shoulder S/l ER 2x10 Theraband row Black TB x30 Theraband shoulder extension Blue TB x30  Elbow flexion 5# 3x 15 Hammer curl 5# 3x 15 Prone row 8#  2x15 Prone shoulder extension 3# 3 x 10 Prone flexion 2# 3x10 Prone triceps extension 4# 2 x 10   7/14 UBE x 3.5 mins lvl 2  Pt received R UE PROM including elbow flexion/extension, forearm pronation/supination, and shoulder ER, flexion, and abduction in supine.   Supine rhythmic stab's 3x30 sec at 90 deg shoulder flexion  S/L ER 2x10 Prone row 3# 2x12 Prone shoulder extension 2# 3x10-12 4D ball rolls on wall approx 15 each Attempted Standing Triceps extension with GTB though stopped due to pain Prone triceps extension 3# x 10, 4#  2x10 Pulleys in flexion abduction x 20 each   7/10 UBE x 4 min L4 (fwd/back each) R shoulder PROM Supine rhythmic stabilization R shoulder Modified push up (on knees)  x5 Theraband row Black TB x30 Theraband shoulder extension Blue TB x30 Theraband triceps extension blue TB 2 x 15 Bilateral shoulder ER with scap retraction GTB 2 x 10 Standing shoulder horizontal abduction GTB x10 (had pain so stopped)  Elbow flexion 5# 3x 15 Hammer curl 5# 3x 15 Prone row 8# 3x10 Prone shoulder extension 3# 3 x 10 Prone shoulder horizontal abduction 0# x10 (had shoulder pain so stopped) Prone triceps extension 5# 3 x 10     7/8 UBE x 6 mins fwd/bwd lvl 1-2  Pt received R elbow flex and extension PROM and shoulder flexion PROM in supine.  Assessed elbow and shoulder ROM and elbow strength.  Ball circles on wall x 10 each cw, ccw, up/down, s/s Standing triceps extension with GTB 2x15 Pulleys in flexion, scaption, and abd x 20 each  UEFI: Prior/Current:  43/80 / 42/80    PATIENT EDUCATION:  Education details: Exercise form, objective findings, progress, POC, HEP.    Person educated: Patient Education method: Explanation, Demonstration, verbal and tactile cues Education comprehension: verbalized understanding, returned demonstration, and needs further education, verbal and tactile cues required  HOME EXERCISE PROGRAM: Access Code: [email or web address] URL: [email or web address] Date: 11/22/2023 Prepared by: Gellen April Earnie Starring   ASSESSMENT:  CLINICAL IMPRESSION:  Pt reported pain after hammer curls today, but did not have pain with bicep curls. He was able to complete prone strengthening without c/o shoulder or elbow pain. Does remain tender to palpation of olecranon, though no triceps tenderness. Good tolerance for shoulder PROM today, though mild restrictions present at end ranges. Held tricep pull downs today with band as this did cause pain last session. Will continue to monitor pain level and progress as tolerated.    OBJECTIVE IMPAIRMENTS: decreased coordination, decreased mobility, decreased ROM, decreased strength, hypomobility, increased edema, increased fascial restrictions, increased muscle spasms, impaired flexibility, impaired UE functional use, improper body mechanics, postural dysfunction, and pain.   ACTIVITY LIMITATIONS: carrying, lifting, bathing, toileting, dressing, reach over head, and hygiene/grooming  PARTICIPATION LIMITATIONS: meal prep, cleaning, laundry, driving, shopping, community activity, and occupation  PERSONAL FACTORS: Age, Fitness, Past/current experiences, and Time since onset of injury/illness/exacerbation are also affecting patient's functional outcome.   REHAB POTENTIAL: Good  CLINICAL DECISION MAKING: Evolving/moderate complexity  EVALUATION COMPLEXITY: Moderate   GOALS: Goals reviewed with patient? Yes  SHORT TERM GOALS: Target date: 12/13/2023  Pt will be ind with initial HEP Baseline: Goal status: ONGOING  2.  Pt will have improved elbow ROM to 10-135 deg Baseline:  Goal status:  GOAL  MET  5/30  3.  Pt will have improved shoulder flexion and abd by at least 10 deg Baseline:  Goal status:  NOT MET     LONG TERM GOALS: Target date: 03/19/2024   Pt will be ind with management and progression of HEP Baseline:  Goal status: PROGRESSING  2.  Pt will have improved elbow ROM to at least 5-140 deg Baseline:  Goal status: 80%  MET  5/30  3.  Pt will have full and pain free shoulder elevation for overhead tasks Baseline:  Goal status: NOT MET  4.  Pt will be able to tolerate lifting and carrying at least 50# for work tasks Baseline:  Goal status: INITIAL  5.  Pt will have R UE strength to at least 90% of L UE strength for work tasks Baseline:  Goal status: PROGRESSING  7/8  6.  Pt will demo MCID on his [employer] or UEFI Baseline:  Goal status: NOT  MET   PLAN:  PT FREQUENCY: 1-2x/wk  PT DURATION: 4-5 weeks  PLANNED INTERVENTIONS: 02835- PT Re-evaluation, 97110-Therapeutic exercises, 97530- Therapeutic activity, 97112- Neuromuscular re-education, 97535- Self Care, 02859- Manual therapy, [id number]- Electrical stimulation (unattended), [phone]- Ionotophoresis 4mg /ml Dexamethasone , Patient/Family education, Taping, Dry Needling, Joint mobilization, Cryotherapy, and Moist heat  PLAN FOR NEXT SESSION:  Continue elbow/shoulder ROM/strengthening    Asberry Rodes, PTA  02/21/24 9:58 AM

## 2024-02-28 ENCOUNTER — Encounter (HOSPITAL_BASED_OUTPATIENT_CLINIC_OR_DEPARTMENT_OTHER): Payer: Self-pay

## 2024-02-28 ENCOUNTER — Ambulatory Visit (HOSPITAL_BASED_OUTPATIENT_CLINIC_OR_DEPARTMENT_OTHER): Payer: Worker's Compensation

## 2024-02-28 DIAGNOSIS — M25521 Pain in right elbow: Secondary | ICD-10-CM

## 2024-02-28 DIAGNOSIS — M25621 Stiffness of right elbow, not elsewhere classified: Secondary | ICD-10-CM | POA: Diagnosis not present

## 2024-02-28 DIAGNOSIS — M6281 Muscle weakness (generalized): Secondary | ICD-10-CM

## 2024-02-28 DIAGNOSIS — M25511 Pain in right shoulder: Secondary | ICD-10-CM

## 2024-02-28 NOTE — Therapy (Signed)
 OUTPATIENT PHYSICAL THERAPY TREATMENT     Patient Name: Mark Miranda MRN: [id number] DOB:Oct 29, 1970, 53 y.o., male Today's Date: 02/28/2024  END OF SESSION:  PT End of Session - 02/28/24 1512     Visit Number 20    Number of Visits 23    Date for PT Re-Evaluation 03/19/24    Authorization Type Worker's Comp    PT Start Time 1517    PT Stop Time 1600    PT Time Calculation (min) 43 min    Activity Tolerance Patient tolerated treatment well    Behavior During Therapy WFL for tasks assessed/performed                        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   Hypercholesterolemia    Hypertension    Metabolic acidosis    Recurrent peritonitis (HCC)    Type 1 diabetes with renal manifestations, controlled (HCC)    Vitamin D  deficiency    Past Surgical History:  Procedure Laterality Date   AV FISTULA PLACEMENT  01/10/2012   Procedure: ARTERIOVENOUS (AV) FISTULA CREATION;  Surgeon: Carlin FORBES Haddock, MD;  Location: [facility] OR;  Service: Vascular;  Laterality: Left;  Left Brachial Cephalic Arteriovenous Fistula   CAPD INSERTION  01/09/2012   Procedure: CONTINUOUS AMBULATORY PERITONEAL DIALYSIS  (CAPD) CATHETER INSERTION;  Surgeon: Elon CHRISTELLA Pacini, MD;  Location: [facility] OR;  Service: General;  Laterality: N/A;  Exteriorization PD Cath   CAPSULOTOMY Right 11/07/2023   Procedure: CAPSULOTOMY, ELBOW;  Surgeon: Genelle Standing, MD;  Location: [facility];  Service: Orthopedics;  Laterality: Right;  RIGHT ELBOW CAPSULAR RELEASE   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   received left kidney, 3 renal arteries, 1 renal vein, ureter and pancreas from 53  year old   HARDWARE REMOVAL Right 11/07/2023   Procedure: REMOVAL, HARDWARE;  Surgeon: Genelle Standing, MD;  Location: [facility];  Service: Orthopedics;  Laterality: Right;   INSERTION OF DIALYSIS CATHETER  01/13/2012   Procedure: INSERTION OF DIALYSIS CATHETER;  Surgeon: Krystal JULIANNA Doing, MD;  Location: [facility] OR;  Service: Vascular;  Laterality: Right;  Internal Jugular   LAPAROSCOPY  03/13/2012   Procedure: LAPAROSCOPY DIAGNOSTIC;  Surgeon: Standing KYM Schultze, MD;  Location: [facility] OR;  Service: General;  Laterality: N/A;   LIVER BIOPSY  04/02/2013   open   ORIF HUMERUS FRACTURE Right 03/07/2023   Procedure: OPEN REDUCTION INTERNAL FIXATION (ORIF) DISTAL HUMERUS FRACTURE;  Surgeon: Genelle Standing, MD;  Location: [facility] ORS;  Service: Orthopedics;  Laterality: Right;   PERITONEAL CATHETER INSERTION     PERITONEAL CATHETER REMOVAL     SMALL INTESTINE SURGERY  04/02/2013   TRICEPS TENDON REPAIR  03/07/2023   Procedure: TRICEPS TENDON REPAIR;  Surgeon: Genelle Standing, MD;  Location: [facility] ORS;  Service: Orthopedics;;   ULNAR NERVE TRANSPOSITION Right 03/07/2023   Procedure: ULNAR NERVE EXPLORATION;  Surgeon: Genelle Standing, MD;  Location: [facility] ORS;  Service: Orthopedics;  Laterality: Right;   Patient Active Problem List   Diagnosis Date Noted   Hyperlipidemia LDL goal <70 11/13/2023   Closed fracture of right distal humerus 03/07/2023   Closed bicondylar fracture  of distal end of right humerus 03/04/2023   Type 2 diabetes mellitus with diabetic chronic kidney disease (HCC) 07/22/2022   Depression, major, 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/2022   Erectile dysfunction 07/24/2017   Vitamin D  deficiency, unspecified 07/24/2017   Low back pain 02/25/2016   Chronic fatigue 01/25/2016   Diabetic peripheral neuropathy (HCC) 11/24/2015   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   Depression 11/02/2015   Tobacco abuse 11/02/2015   End stage renal disease (HCC)  12/29/2011   HTN (hypertension) 01/25/2011    PCP: [name], [name], MD  REFERRING PROVIDER: Genelle Standing, MD  REFERRING DIAG: [id number] (ICD-10-CM) - Other closed displaced fracture of distal end of right humerus, initial encounter  THERAPY DIAG:  Stiffness of right elbow, not elsewhere classified  Muscle weakness (generalized)  Pain in right elbow  Right shoulder pain, unspecified chronicity  Rationale for Evaluation and Treatment: Rehabilitation  ONSET DATE: 11/07/23 R distal humeral elbow hardware removal  SUBJECTIVE:   SUBJECTIVE STATEMENT: Pt reports 2/10 pain in R shoulder and 1/10 in elbow.     PERTINENT HISTORY: Per 11/16/23: Status post right distal humeral elbow hardware removal.  At this time he is progressing with range of motion.  I would like him to work with physical therapy to progress his flexion and extension.   PAIN:  Are you having pain? Yes: NPRS scale:  1/10 R elbow, 2/10 shoulder pain  Pain location: see above Pain description: sharp Aggravating factors:   Relieving factors:     PRECAUTIONS: None  RED FLAGS: None   WEIGHT BEARING RESTRICTIONS: No  FALLS:  Has patient fallen in last 6 months? 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  Pt wants full strength of his arm and perform act's without pain.  NEXT MD VISIT: 11/23/23 for suture removal  OBJECTIVE:  Note: Objective measures were completed at Evaluation unless otherwise noted.  DIAGNOSTIC FINDINGS: R elbow x-ray 10/29/23 IMPRESSION: 1. Status post ORIF of the distal humerus without evidence of hardware complication. 2. Several calcific densities along the palmar aspect of the elbow joint may reflect loose  bodies.   COGNITION: Overall cognitive status: Within functional limits for tasks assessed      MUSCLE LENGTH: Biceps: ROM below    UPPER EXTREMITY ROM:  Active ROM (measured in supine) Right eval Left eval Right 5/16 Right 5/27  Left 5/27 Right 5/30 Right 7/8 Left 7/8  Shoulder flexion 145*   148 with pain   143 with pain 149  Shoulder scaption       125   Shoulder abduction 85*   118 with pain 157  88 with pain 150  Shoulder adduction          Shoulder extension          Shoulder internal rotation 80         Shoulder external rotation 75         Elbow flexion 115 145 143 144   143   Elbow extension -20 3 -12/-10 AROM/PROM -15/-11 AROM/PROM  -10 AROM -10 AROM   Wrist flexion          Wrist extension  Wrist ulnar deviation          Wrist radial deviation          Wrist pronation      WFL bilat    Wrist supination      WFL bilat     (Blank rows = not tested, * = pain)  UPPER EXTREMITY MMT:  MMT Right eval Left eval Right 5/30 Comparison 5/30 Right 7/8 compcarison  Shoulder flexion 4       Shoulder extension        Shoulder abduction 4-       Shoulder adduction        Shoulder extension        Shoulder internal rotation 4-       Shoulder external rotation 3+       Middle trapezius        Lower trapezius        Elbow flexion (90 deg) 24.7, 23.6 38.3, 40.7 29.9, 29.5 R  73% of L 34 R 84% of L  Elbow extension (90 deg) 12.9, 9.2 21.9, 28.3 19.8 R 70% of L 20.5 with pain R 72% of L  Wrist flexion        Wrist extension        Wrist ulnar deviation        Wrist radial deviation        Wrist pronation   5/5     Wrist supination   5/5 with shoulder pain     Grip strength 35 lbs 55 lbs 49 lbs      (Blank rows = not tested)                                                                                                                                TREATMENT DATE:   7/23 UBE x 4 min L2 (fwd/back each) R shoulder PROM, R elbow PROM STM  deltoid, UT Supine rhythmic stabilization R shoulder Scapular mobilizations in sidelying S/l windmill stretch (some pain) STM R UT S/l ER 2x10 Elbow flexion 5# 3x 10 Prone row 8#  2x15 Prone shoulder extension 3# 3 x 10 Prone flexion 0# 3x10 (tactile scapular cuing) Prone horizontal abduction 0# 2x10 (tactile scapular cuing)  7/16 UBE x 4 min L2 (fwd/back each) R shoulder PROM, R elbow PROM Supine rhythmic stabilization R shoulder S/l ER 2x10 Theraband row Black TB x30 Theraband shoulder extension Blue TB x30  Elbow flexion 5# 3x 15 Hammer curl 5# 3x 15 Prone row 8#  2x15 Prone shoulder extension 3# 3 x 10 Prone flexion 2# 3x10 Prone triceps extension 4# 2 x 10   7/14 UBE x 3.5 mins lvl 2  Pt received R UE PROM including elbow flexion/extension, forearm pronation/supination, and shoulder ER, flexion, and abduction in supine.   Supine rhythmic stab's 3x30 sec at 90 deg shoulder flexion  S/L ER 2x10 Prone row 3# 2x12 Prone shoulder extension 2#  3x10-12 4D ball rolls on wall approx 15 each Attempted Standing Triceps extension with GTB though stopped due to pain Prone triceps extension 3# x 10, 4#  2x10 Pulleys in flexion abduction x 20 each   7/10 UBE x 4 min L4 (fwd/back each) R shoulder PROM Supine rhythmic stabilization R shoulder Modified push up (on knees) x5 Theraband row Black TB x30 Theraband shoulder extension Blue TB x30 Theraband triceps extension blue TB 2 x 15 Bilateral shoulder ER with scap retraction GTB 2 x 10 Standing shoulder horizontal abduction GTB x10 (had pain so stopped)  Elbow flexion 5# 3x 15 Hammer curl 5# 3x 15 Prone row 8# 3x10 Prone shoulder extension 3# 3 x 10 Prone shoulder horizontal abduction 0# x10 (had shoulder pain so stopped) Prone triceps extension 5# 3 x 10     7/8 UBE x 6 mins fwd/bwd lvl 1-2  Pt received R elbow flex and extension PROM and shoulder flexion PROM in supine.  Assessed elbow and shoulder  ROM and elbow strength.  Ball circles on wall x 10 each cw, ccw, up/down, s/s Standing triceps extension with GTB 2x15 Pulleys in flexion, scaption, and abd x 20 each  UEFI: Prior/Current:  43/80 / 42/80    PATIENT EDUCATION:  Education details: Exercise form, objective findings, progress, POC, HEP.   Person educated: Patient Education method: Explanation, Demonstration, verbal and tactile cues Education comprehension: verbalized understanding, returned demonstration, and needs further education, verbal and tactile cues required  HOME EXERCISE PROGRAM: Access Code: [email or web address] URL: [email or web address] Date: 11/22/2023 Prepared by: Gellen April Earnie Starring   ASSESSMENT:  CLINICAL IMPRESSION:  Focused on shoulder ROM and scapular mobility today. He continues to report a catching feeling with circumduction movements.  Scapular depression on R side noted.  Performed scapular mobilizations in side-lying position with good tolerance.  Patient also notably tight into upper traps so performed STM to decrease tension.  No complaints of elbow pain with exercises.  Will continue to monitor this and continue with scap strengthening and stability.    OBJECTIVE IMPAIRMENTS: decreased coordination, decreased mobility, decreased ROM, decreased strength, hypomobility, increased edema, increased fascial restrictions, increased muscle spasms, impaired flexibility, impaired UE functional use, improper body mechanics, postural dysfunction, and pain.   ACTIVITY LIMITATIONS: carrying, lifting, bathing, toileting, dressing, reach over head, and hygiene/grooming  PARTICIPATION LIMITATIONS: meal prep, cleaning, laundry, driving, shopping, community activity, and occupation  PERSONAL FACTORS: Age, Fitness, Past/current experiences, and Time since onset of injury/illness/exacerbation are also affecting patient's functional outcome.   REHAB POTENTIAL: Good  CLINICAL DECISION MAKING:  Evolving/moderate complexity  EVALUATION COMPLEXITY: Moderate   GOALS: Goals reviewed with patient? Yes  SHORT TERM GOALS: Target date: 12/13/2023  Pt will be ind with initial HEP Baseline: Goal status: ONGOING  2.  Pt will have improved elbow ROM to 10-135 deg Baseline:  Goal status:  GOAL MET  5/30  3.  Pt will have improved shoulder flexion and abd by at least 10 deg Baseline:  Goal status:  NOT MET     LONG TERM GOALS: Target date: 03/19/2024   Pt will be ind with management and progression of HEP Baseline:  Goal status: PROGRESSING  2.  Pt will have improved elbow ROM to at least 5-140 deg Baseline:  Goal status: 80%  MET  5/30  3.  Pt will have full and pain free shoulder elevation for overhead tasks Baseline:  Goal status: NOT MET  4.  Pt will be able to tolerate lifting and carrying at  least 50# for work tasks Baseline:  Goal status: INITIAL  5.  Pt will have R UE strength to at least 90% of L UE strength for work tasks Baseline:  Goal status: PROGRESSING  7/8  6.  Pt will demo MCID on his [employer] or UEFI Baseline:  Goal status: NOT MET   PLAN:  PT FREQUENCY: 1-2x/wk  PT DURATION: 4-5 weeks  PLANNED INTERVENTIONS: 02835- PT Re-evaluation, 97110-Therapeutic exercises, 97530- Therapeutic activity, 97112- Neuromuscular re-education, 97535- Self Care, 02859- Manual therapy, [id number]- Electrical stimulation (unattended), [phone]- Ionotophoresis 4mg /ml Dexamethasone , Patient/Family education, Taping, Dry Needling, Joint mobilization, Cryotherapy, and Moist heat  PLAN FOR NEXT SESSION:  Continue elbow/shoulder ROM/strengthening    Asberry Rodes, PTA  02/28/24 5:11 PM

## 2024-03-06 ENCOUNTER — Ambulatory Visit (HOSPITAL_BASED_OUTPATIENT_CLINIC_OR_DEPARTMENT_OTHER): Payer: Worker's Compensation | Admitting: Physical Therapy

## 2024-03-06 DIAGNOSIS — M25621 Stiffness of right elbow, not elsewhere classified: Secondary | ICD-10-CM

## 2024-03-06 DIAGNOSIS — M25521 Pain in right elbow: Secondary | ICD-10-CM

## 2024-03-06 DIAGNOSIS — M25511 Pain in right shoulder: Secondary | ICD-10-CM

## 2024-03-06 DIAGNOSIS — M6281 Muscle weakness (generalized): Secondary | ICD-10-CM

## 2024-03-06 NOTE — Therapy (Signed)
 OUTPATIENT PHYSICAL THERAPY TREATMENT     Patient Name: Mark Miranda MRN: [id number] DOB:[birth year], 53 y.o., male Today's Date: 03/07/2024  END OF SESSION:  PT End of Session - 03/06/24 0934     Visit Number 21    Number of Visits 23    Date for PT Re-Evaluation 03/19/24    Authorization Type Worker's Comp    PT Start Time [phone]    PT Stop Time 0930    PT Time Calculation (min) 41 min    Activity Tolerance Other (comment)   Limited due to not feeling well   Behavior During Therapy [facility] for tasks assessed/performed                         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   Hypercholesterolemia    Hypertension    Metabolic acidosis    Recurrent peritonitis (HCC)    Type 1 diabetes with renal manifestations, controlled (HCC)    Vitamin D  deficiency    Past Surgical History:  Procedure Laterality Date   AV FISTULA PLACEMENT  01/10/2012   Procedure: ARTERIOVENOUS (AV) FISTULA CREATION;  Surgeon: Carlin FORBES Haddock, MD;  Location: [facility] OR;  Service: Vascular;  Laterality: Left;  Left Brachial Cephalic Arteriovenous Fistula   CAPD INSERTION  01/09/2012   Procedure: CONTINUOUS AMBULATORY PERITONEAL DIALYSIS  (CAPD) CATHETER INSERTION;  Surgeon: Elon CHRISTELLA Pacini, MD;  Location: [facility] OR;  Service: General;  Laterality: N/A;  Exteriorization PD Cath   CAPSULOTOMY Right 11/07/2023   Procedure: CAPSULOTOMY, ELBOW;  Surgeon: Genelle Standing, MD;  Location: [facility];  Service: Orthopedics;  Laterality: Right;  RIGHT ELBOW CAPSULAR RELEASE   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   received left kidney, 3 renal arteries, 1 renal vein, ureter and  pancreas from 53 year old   HARDWARE REMOVAL Right 11/07/2023   Procedure: REMOVAL, HARDWARE;  Surgeon: Genelle Standing, MD;  Location: [facility];  Service: Orthopedics;  Laterality: Right;   INSERTION OF DIALYSIS CATHETER  01/13/2012   Procedure: INSERTION OF DIALYSIS CATHETER;  Surgeon: Krystal JULIANNA Doing, MD;  Location: [facility] OR;  Service: Vascular;  Laterality: Right;  Internal Jugular   LAPAROSCOPY  03/13/2012   Procedure: LAPAROSCOPY DIAGNOSTIC;  Surgeon: Standing KYM Schultze, MD;  Location: [facility] OR;  Service: General;  Laterality: N/A;   LIVER BIOPSY  04/02/2013   open   ORIF HUMERUS FRACTURE Right 03/07/2023   Procedure: OPEN REDUCTION INTERNAL FIXATION (ORIF) DISTAL HUMERUS FRACTURE;  Surgeon: Genelle Standing, MD;  Location: [facility] ORS;  Service: Orthopedics;  Laterality: Right;   PERITONEAL CATHETER INSERTION     PERITONEAL CATHETER REMOVAL     SMALL INTESTINE SURGERY  04/02/2013   TRICEPS TENDON REPAIR  03/07/2023   Procedure: TRICEPS TENDON REPAIR;  Surgeon: Genelle Standing, MD;  Location: [facility] ORS;  Service: Orthopedics;;   ULNAR NERVE TRANSPOSITION Right 03/07/2023   Procedure: ULNAR NERVE EXPLORATION;  Surgeon: Genelle Standing, MD;  Location: [facility] ORS;  Service: Orthopedics;  Laterality: Right;   Patient Active Problem List   Diagnosis Date Noted   Hyperlipidemia LDL goal <70 11/13/2023   Closed fracture of right distal humerus  03/07/2023   Closed bicondylar fracture of distal end of right humerus 03/04/2023   Type 2 diabetes mellitus with diabetic chronic kidney disease (HCC) 07/22/2022   Depression, major, 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/2022   Erectile dysfunction 07/24/2017   Vitamin D  deficiency, unspecified 07/24/2017   Low back pain 02/25/2016   Chronic fatigue 01/25/2016   Diabetic peripheral neuropathy (HCC) 11/24/2015   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   Depression 11/02/2015   Tobacco abuse 11/02/2015   End stage renal  disease (HCC) 12/29/2011   HTN (hypertension) 01/25/2011    PCP: [name], [name], MD  REFERRING PROVIDER: Genelle Standing, MD  REFERRING DIAG: [phone] (ICD-10-CM) - Other closed displaced fracture of distal end of right humerus, initial encounter  THERAPY DIAG:  Stiffness of right elbow, not elsewhere classified  Muscle weakness (generalized)  Pain in right elbow  Right shoulder pain, unspecified chronicity  Rationale for Evaluation and Treatment: Rehabilitation  ONSET DATE: 11/07/23 R distal humeral elbow hardware removal  SUBJECTIVE:   SUBJECTIVE STATEMENT: Pt states he felt a little dizzy this AM and feels weak today.  Pt states he hasn't eaten today, just had coffee. Pt reports having increased pain after prior Rx which lasted the rest of the day.  Pt still has pain with extending elbow/stretching arm.  He states it still hurts.      PERTINENT HISTORY: Per 11/16/23: Status post right distal humeral elbow hardware removal.  At this time he is progressing with range of motion.  I would like him to work with physical therapy to progress his flexion and extension.   PAIN:  Are you having pain? Yes: NPRS scale:  2/10 R elbow, 1 or less/10 shoulder pain  Pain location: see above Pain description: sharp Aggravating factors:   Relieving factors:     PRECAUTIONS: None  RED FLAGS: None   WEIGHT BEARING RESTRICTIONS: No  FALLS:  Has patient fallen in last 6 months? 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  Pt wants full strength of his arm and perform act's without pain.  NEXT MD VISIT: 11/23/23 for suture removal  OBJECTIVE:  Note: Objective measures were completed at Evaluation unless  otherwise noted.  DIAGNOSTIC FINDINGS: R elbow x-ray 10/29/23 IMPRESSION: 1. Status post ORIF of the distal humerus without evidence of hardware complication. 2. Several calcific densities along the palmar aspect of the elbow joint may reflect loose bodies.   COGNITION: Overall cognitive status: Within functional limits for tasks assessed      MUSCLE LENGTH: Biceps: ROM below    UPPER EXTREMITY ROM:  Active ROM (measured in supine) Right eval Left eval Right 5/16 Right 5/27  Left 5/27 Right 5/30 Right 7/8 Left 7/8  Shoulder flexion 145*   148 with pain   143 with pain 149  Shoulder scaption       125   Shoulder abduction 85*   118 with pain 157  88 with pain 150  Shoulder adduction          Shoulder extension          Shoulder internal rotation 80         Shoulder external rotation 75  Elbow flexion 115 145 143 144   143   Elbow extension -20 3 -12/-10 AROM/PROM -15/-11 AROM/PROM  -10 AROM -10 AROM   Wrist flexion          Wrist extension          Wrist ulnar deviation          Wrist radial deviation          Wrist pronation      WFL bilat    Wrist supination      WFL bilat     (Blank rows = not tested, * = pain)  UPPER EXTREMITY MMT:  MMT Right eval Left eval Right 5/30 Comparison 5/30 Right 7/8 compcarison  Shoulder flexion 4       Shoulder extension        Shoulder abduction 4-       Shoulder adduction        Shoulder extension        Shoulder internal rotation 4-       Shoulder external rotation 3+       Middle trapezius        Lower trapezius        Elbow flexion (90 deg) 24.7, 23.6 38.3, 40.7 29.9, 29.5 R  73% of L 34 R 84% of L  Elbow extension (90 deg) 12.9, 9.2 21.9, 28.3 19.8 R 70% of L 20.5 with pain R 72% of L  Wrist flexion        Wrist extension        Wrist ulnar deviation        Wrist radial deviation        Wrist pronation   5/5     Wrist supination   5/5 with shoulder pain     Grip strength 35 lbs 55 lbs 49 lbs       (Blank rows = not tested)                                                                                                                                TREATMENT DATE:   7/30 UBE x 4 mins L2 (fwd/bwd 2 min each)  BP: 158/91 HR:  73  Pt received R shoulder PROM in flexion, scaption, and ER in supine per pt and tissue tolerance. Pt received R elbow flex and extension and forearm pronation and supination in supine per pt and tissue tolerance.   Supine rhythmic stab's at 60/90/120 deg 2 x 30 sec each S/L ER 2x10   7/23 UBE x 4 min L2 (fwd/back each) R shoulder PROM, R elbow PROM STM deltoid, UT Supine rhythmic stabilization R shoulder Scapular mobilizations in sidelying S/l windmill stretch (some pain) STM R UT S/l ER 2x10 Elbow flexion 5# 3x 10 Prone row 8#  2x15 Prone shoulder extension 3# 3 x 10 Prone flexion 0# 3x10 (tactile scapular cuing) Prone horizontal abduction 0# 2x10 (tactile scapular cuing)  7/16  UBE x 4 min L2 (fwd/back each) R shoulder PROM, R elbow PROM Supine rhythmic stabilization R shoulder S/l ER 2x10 Theraband row Black TB x30 Theraband shoulder extension Blue TB x30  Elbow flexion 5# 3x 15 Hammer curl 5# 3x 15 Prone row 8#  2x15 Prone shoulder extension 3# 3 x 10 Prone flexion 2# 3x10 Prone triceps extension 4# 2 x 10   7/14 UBE x 3.5 mins lvl 2  Pt received R UE PROM including elbow flexion/extension, forearm pronation/supination, and shoulder ER, flexion, and abduction in supine.   Supine rhythmic stab's 3x30 sec at 90 deg shoulder flexion  S/L ER 2x10 Prone row 3# 2x12 Prone shoulder extension 2# 3x10-12 4D ball rolls on wall approx 15 each Attempted Standing Triceps extension with GTB though stopped due to pain Prone triceps extension 3# x 10, 4#  2x10 Pulleys in flexion abduction x 20 each   7/10 UBE x 4 min L4 (fwd/back each) R shoulder PROM Supine rhythmic stabilization R shoulder Modified push up (on knees)  x5 Theraband row Black TB x30 Theraband shoulder extension Blue TB x30 Theraband triceps extension blue TB 2 x 15 Bilateral shoulder ER with scap retraction GTB 2 x 10 Standing shoulder horizontal abduction GTB x10 (had pain so stopped)  Elbow flexion 5# 3x 15 Hammer curl 5# 3x 15 Prone row 8# 3x10 Prone shoulder extension 3# 3 x 10 Prone shoulder horizontal abduction 0# x10 (had shoulder pain so stopped) Prone triceps extension 5# 3 x 10     PATIENT EDUCATION:  Education details: Exercise form, BP, sx management.   Person educated: Patient Education method: Explanation, Demonstration, verbal and tactile cues Education comprehension: verbalized understanding, returned demonstration, and needs further education, verbal and tactile cues required  HOME EXERCISE PROGRAM: Access Code: [email or web address] URL: [email or web address] Date: 11/22/2023 Prepared by: Gellen April Earnie Starring   ASSESSMENT:  CLINICAL IMPRESSION:  PT was limited due to pt not feeling well.  He presents to treatment stating he felt a little dizzy this AM and feels weak today.  Pt reports not having breakfast.  PT took BP and BP was a little high.  PT asked pt if he wanted to hold on PT today and Pt stated he wanted to continue and perform PT today.  PT limited exercises performing table exercises and shoulder/elbow PROM.  Pt tolerated those exercises and PROM well though felt dizzy when sitting up.  PT had pt sit and rest.  PT asked pt if he had checked his blood glucose recently and he stated he hadn't checked it in about 3 weeks.  Pt rested and drank some gatorade.  He felt better after sitting and drinking gatorade.  Pt reports he will go home and check his blood glucose.  PT instructed pt to call MD if he continues to have sx's and/or dizziness.  PT called pt later and he stated he was feeling better.  He ate some food when he returned home.   OBJECTIVE IMPAIRMENTS: decreased coordination, decreased  mobility, decreased ROM, decreased strength, hypomobility, increased edema, increased fascial restrictions, increased muscle spasms, impaired flexibility, impaired UE functional use, improper body mechanics, postural dysfunction, and pain.   ACTIVITY LIMITATIONS: carrying, lifting, bathing, toileting, dressing, reach over head, and hygiene/grooming  PARTICIPATION LIMITATIONS: meal prep, cleaning, laundry, driving, shopping, community activity, and occupation  PERSONAL FACTORS: Age, Fitness, Past/current experiences, and Time since onset of injury/illness/exacerbation are also affecting patient's functional outcome.   REHAB POTENTIAL: Good  CLINICAL DECISION MAKING: Evolving/moderate  complexity  EVALUATION COMPLEXITY: Moderate   GOALS: Goals reviewed with patient? Yes  SHORT TERM GOALS: Target date: 12/13/2023  Pt will be ind with initial HEP Baseline: Goal status: ONGOING  2.  Pt will have improved elbow ROM to 10-135 deg Baseline:  Goal status:  GOAL MET  5/30  3.  Pt will have improved shoulder flexion and abd by at least 10 deg Baseline:  Goal status:  NOT MET     LONG TERM GOALS: Target date: 03/19/2024   Pt will be ind with management and progression of HEP Baseline:  Goal status: PROGRESSING  2.  Pt will have improved elbow ROM to at least 5-140 deg Baseline:  Goal status: 80%  MET  5/30  3.  Pt will have full and pain free shoulder elevation for overhead tasks Baseline:  Goal status: NOT MET  4.  Pt will be able to tolerate lifting and carrying at least 50# for work tasks Baseline:  Goal status: INITIAL  5.  Pt will have R UE strength to at least 90% of L UE strength for work tasks Baseline:  Goal status: PROGRESSING  7/8  6.  Pt will demo MCID on his Quick[employer] or UEFI Baseline:  Goal status: NOT MET   PLAN:  PT FREQUENCY: 1-2x/wk  PT DURATION: 4-5 weeks  PLANNED INTERVENTIONS: 02835- PT Re-evaluation, 97110-Therapeutic exercises, 97530-  Therapeutic activity, 97112- Neuromuscular re-education, 97535- Self Care, 02859- Manual therapy, [id number]- Electrical stimulation (unattended), [phone]- Ionotophoresis 4mg /ml Dexamethasone , Patient/Family education, Taping, Dry Needling, Joint mobilization, Cryotherapy, and Moist heat  PLAN FOR NEXT SESSION:  Continue elbow/shoulder ROM/strengthening    Leigh Minerva III PT, DPT 03/07/24 12:18 PM

## 2024-03-07 ENCOUNTER — Encounter (HOSPITAL_BASED_OUTPATIENT_CLINIC_OR_DEPARTMENT_OTHER): Payer: Self-pay | Admitting: Physical Therapy

## 2024-03-12 ENCOUNTER — Ambulatory Visit (HOSPITAL_BASED_OUTPATIENT_CLINIC_OR_DEPARTMENT_OTHER): Payer: Worker's Compensation | Attending: Orthopaedic Surgery | Admitting: Physical Therapy

## 2024-03-12 ENCOUNTER — Encounter (HOSPITAL_BASED_OUTPATIENT_CLINIC_OR_DEPARTMENT_OTHER): Payer: Self-pay | Admitting: Physical Therapy

## 2024-03-12 DIAGNOSIS — M25521 Pain in right elbow: Secondary | ICD-10-CM | POA: Insufficient documentation

## 2024-03-12 DIAGNOSIS — M25621 Stiffness of right elbow, not elsewhere classified: Secondary | ICD-10-CM | POA: Diagnosis present

## 2024-03-12 DIAGNOSIS — M6281 Muscle weakness (generalized): Secondary | ICD-10-CM | POA: Diagnosis present

## 2024-03-12 DIAGNOSIS — M25511 Pain in right shoulder: Secondary | ICD-10-CM | POA: Diagnosis present

## 2024-03-12 NOTE — Therapy (Incomplete)
 OUTPATIENT PHYSICAL THERAPY TREATMENT     Patient Name: Mark Miranda MRN: [id number] DOB:August 10, 1970, 53 y.o., male Today's Date: 03/13/2024  END OF SESSION:  PT End of Session - 03/12/24 0901     Visit Number 22    Number of Visits 23    Date for PT Re-Evaluation 03/19/24    Authorization Type Worker's Comp    PT Start Time 0855    PT Stop Time 0935    PT Time Calculation (min) 40 min    Activity Tolerance Patient tolerated treatment well    Behavior During Therapy [facility] for tasks assessed/performed                        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   Hypercholesterolemia    Hypertension    Metabolic acidosis    Recurrent peritonitis (HCC)    Type 1 diabetes with renal manifestations, controlled (HCC)    Vitamin D  deficiency    Past Surgical History:  Procedure Laterality Date   AV FISTULA PLACEMENT  01/10/2012   Procedure: ARTERIOVENOUS (AV) FISTULA CREATION;  Surgeon: Carlin FORBES Haddock, MD;  Location: [facility] OR;  Service: Vascular;  Laterality: Left;  Left Brachial Cephalic Arteriovenous Fistula   CAPD INSERTION  01/09/2012   Procedure: CONTINUOUS AMBULATORY PERITONEAL DIALYSIS  (CAPD) CATHETER INSERTION;  Surgeon: Elon CHRISTELLA Pacini, MD;  Location: [facility] OR;  Service: General;  Laterality: N/A;  Exteriorization PD Cath   CAPSULOTOMY Right 11/07/2023   Procedure: CAPSULOTOMY, ELBOW;  Surgeon: Genelle Standing, MD;  Location: [facility];  Service: Orthopedics;  Laterality: Right;  RIGHT ELBOW CAPSULAR RELEASE   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   received left kidney, 3 renal arteries, 1 renal vein, ureter and pancreas from 53  year old   HARDWARE REMOVAL Right 11/07/2023   Procedure: REMOVAL, HARDWARE;  Surgeon: Genelle Standing, MD;  Location: [facility];  Service: Orthopedics;  Laterality: Right;   INSERTION OF DIALYSIS CATHETER  01/13/2012   Procedure: INSERTION OF DIALYSIS CATHETER;  Surgeon: Krystal JULIANNA Doing, MD;  Location: [facility] OR;  Service: Vascular;  Laterality: Right;  Internal Jugular   LAPAROSCOPY  03/13/2012   Procedure: LAPAROSCOPY DIAGNOSTIC;  Surgeon: Standing KYM Schultze, MD;  Location: [facility] OR;  Service: General;  Laterality: N/A;   LIVER BIOPSY  04/02/2013   open   ORIF HUMERUS FRACTURE Right 03/07/2023   Procedure: OPEN REDUCTION INTERNAL FIXATION (ORIF) DISTAL HUMERUS FRACTURE;  Surgeon: Genelle Standing, MD;  Location: [facility] ORS;  Service: Orthopedics;  Laterality: Right;   PERITONEAL CATHETER INSERTION     PERITONEAL CATHETER REMOVAL     SMALL INTESTINE SURGERY  04/02/2013   TRICEPS TENDON REPAIR  03/07/2023   Procedure: TRICEPS TENDON REPAIR;  Surgeon: Genelle Standing, MD;  Location: [facility] ORS;  Service: Orthopedics;;   ULNAR NERVE TRANSPOSITION Right 03/07/2023   Procedure: ULNAR NERVE EXPLORATION;  Surgeon: Genelle Standing, MD;  Location: [facility] ORS;  Service: Orthopedics;  Laterality: Right;   Patient Active Problem List   Diagnosis Date Noted   Hyperlipidemia LDL goal <70 11/13/2023   Closed fracture of right distal humerus 03/07/2023   Closed bicondylar fracture  of distal end of right humerus 03/04/2023   Type 2 diabetes mellitus with diabetic chronic kidney disease (HCC) 07/22/2022   Depression, major, 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/2022   Erectile dysfunction 07/24/2017   Vitamin D  deficiency, unspecified 07/24/2017   Low back pain 02/25/2016   Chronic fatigue 01/25/2016   Diabetic peripheral neuropathy (HCC) 11/24/2015   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   Depression 11/02/2015   Tobacco abuse 11/02/2015   End stage renal disease (HCC)  12/29/2011   HTN (hypertension) 01/25/2011    PCP: [name], [name], MD  REFERRING PROVIDER: Genelle Standing, MD  REFERRING DIAG: [phone] (ICD-10-CM) - Other closed displaced fracture of distal end of right humerus, initial encounter  THERAPY DIAG:  Stiffness of right elbow, not elsewhere classified  Muscle weakness (generalized)  Pain in right elbow  Right shoulder pain, unspecified chronicity  Rationale for Evaluation and Treatment: Rehabilitation  ONSET DATE: 11/07/23 R distal humeral elbow hardware removal  SUBJECTIVE:   SUBJECTIVE STATEMENT: Pt reports he doesn't notice any recent improvements with daily activities.     PERTINENT HISTORY: Per 11/16/23: Status post right distal humeral elbow hardware removal.  At this time he is progressing with range of motion.  I would like him to work with physical therapy to progress his flexion and extension.   PAIN:  Are you having pain? Yes: NPRS scale:  1/10 R elbow, 1/10 shoulder pain  Pain location: see above Pain description: sharp Aggravating factors:   Relieving factors:     PRECAUTIONS: None  RED FLAGS: None   WEIGHT BEARING RESTRICTIONS: No  FALLS:  Has patient fallen in last 6 months? 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  Pt wants full strength of his arm and perform act's without pain.  NEXT MD VISIT: 11/23/23 for suture removal  OBJECTIVE:  Note: Objective measures were completed at Evaluation unless otherwise noted.  DIAGNOSTIC FINDINGS: R elbow x-ray 10/29/23 IMPRESSION: 1. Status post ORIF of the distal humerus without evidence of hardware complication. 2. Several calcific densities along the palmar aspect of the elbow joint  may reflect loose bodies.   COGNITION: Overall cognitive status: Within functional limits for tasks assessed      MUSCLE LENGTH: Biceps: ROM below    UPPER EXTREMITY ROM:  Active ROM (measured in supine) Right eval Left eval Right 5/16 Right 5/27  Left 5/27 Right 5/30 Right 7/8 Left 7/8  Shoulder flexion 145*   148 with pain   143 with pain 149  Shoulder scaption       125   Shoulder abduction 85*   118 with pain 157  88 with pain 150  Shoulder adduction          Shoulder extension          Shoulder internal rotation 80         Shoulder external rotation 75         Elbow flexion 115 145 143 144   143   Elbow extension -20 3 -12/-10 AROM/PROM -15/-11 AROM/PROM  -10 AROM -10 AROM   Wrist flexion          Wrist extension  Wrist ulnar deviation          Wrist radial deviation          Wrist pronation      WFL bilat    Wrist supination      WFL bilat     (Blank rows = not tested, * = pain)  UPPER EXTREMITY MMT:  MMT Right eval Left eval Right 5/30 Comparison 5/30 Right 7/8 compcarison  Shoulder flexion 4       Shoulder extension        Shoulder abduction 4-       Shoulder adduction        Shoulder extension        Shoulder internal rotation 4-       Shoulder external rotation 3+       Middle trapezius        Lower trapezius        Elbow flexion (90 deg) 24.7, 23.6 38.3, 40.7 29.9, 29.5 R  73% of L 34 R 84% of L  Elbow extension (90 deg) 12.9, 9.2 21.9, 28.3 19.8 R 70% of L 20.5 with pain R 72% of L  Wrist flexion        Wrist extension        Wrist ulnar deviation        Wrist radial deviation        Wrist pronation   5/5     Wrist supination   5/5 with shoulder pain     Grip strength 35 lbs 55 lbs 49 lbs      (Blank rows = not tested)                                                                                                                                TREATMENT DATE:   8/5 UBE x 4 mins L2 (fwd/bwd 2 min each)  Pt received R  shoulder PROM in flexion, scaption, and ER in supine per pt and tissue tolerance. Pt received R elbow flex and extension and forearm pronation and supination in supine per pt and tissue tolerance.   Supine rhythmic stab's at 90/60/120 deg 2x30 sec each S/L ER x15, 1# 2x12 Prone shoulder extension 3# 3x10 Prone horizontal abd 2x10 Theraband row Black TB x30 Standing triceps extension with GTB 3x10  (L UE holding other end of the GTB) 4D ball rolls on wall x10 each      7/30 UBE x 4 mins L2 (fwd/bwd 2 min each)   BP: 158/91 HR:  73   Pt received R shoulder PROM in flexion, scaption, and ER in supine per pt and tissue tolerance. Pt received R elbow flex and extension and forearm pronation and supination in supine per pt and tissue tolerance.    Supine rhythmic stab's at 60/90/120 deg 2 x 30 sec each S/L ER 2x10  7/23 UBE x 4 min L2 (fwd/back each) R shoulder PROM, R elbow  PROM STM deltoid, UT Supine rhythmic stabilization R shoulder Scapular mobilizations in sidelying S/l windmill stretch (some pain) STM R UT S/l ER 2x10 Elbow flexion 5# 3x 10 Prone row 8#  2x15 Prone shoulder extension 3# 3 x 10 Prone flexion 0# 3x10 (tactile scapular cuing) Prone horizontal abduction 0# 2x10 (tactile scapular cuing)  7/16 UBE x 4 min L2 (fwd/back each) R shoulder PROM, R elbow PROM Supine rhythmic stabilization R shoulder S/l ER 2x10 Theraband row Black TB x30 Theraband shoulder extension Blue TB x30  Elbow flexion 5# 3x 15 Hammer curl 5# 3x 15 Prone row 8#  2x15 Prone shoulder extension 3# 3 x 10 Prone flexion 2# 3x10 Prone triceps extension 4# 2 x 10   7/14 UBE x 3.5 mins lvl 2  Pt received R UE PROM including elbow flexion/extension, forearm pronation/supination, and shoulder ER, flexion, and abduction in supine.   Supine rhythmic stab's 3x30 sec at 90 deg shoulder flexion  S/L ER 2x10 Prone row 3# 2x12 Prone shoulder extension 2# 3x10-12 4D ball rolls on  wall approx 15 each Attempted Standing Triceps extension with GTB though stopped due to pain Prone triceps extension 3# x 10, 4#  2x10 Pulleys in flexion abduction x 20 each     PATIENT EDUCATION:  Education details: Exercise form, POC, HEP.   Person educated: Patient Education method: Explanation, Demonstration, verbal and tactile cues Education comprehension: verbalized understanding, returned demonstration, and needs further education, verbal and tactile cues required  HOME EXERCISE PROGRAM: Access Code: [email or web address] URL: [email or web address] Date: 11/22/2023 Prepared by: Gellen April Earnie Starring   ASSESSMENT:  CLINICAL IMPRESSION:  Pt seems to be plateauing in his functional progress.  Pt reports he doesn't notice any recent improvements with daily activities.  He continues to have shoulder pain. PT performed PROM to shoulder and elbow and he had good tolerance.  He does have pain toward the end range of his available flexion and scaption PROM.  He had better tolerance with exercises today.  Pt had good tolerance with resisted standing triceps extension.  PT had pt hold theraband with contralateral UE.  He did have some discomfort with ball rolls on wall.  He reports increased pain to 2 - 2.5/10 after Rx.   OBJECTIVE IMPAIRMENTS: decreased coordination, decreased mobility, decreased ROM, decreased strength, hypomobility, increased edema, increased fascial restrictions, increased muscle spasms, impaired flexibility, impaired UE functional use, improper body mechanics, postural dysfunction, and pain.   ACTIVITY LIMITATIONS: carrying, lifting, bathing, toileting, dressing, reach over head, and hygiene/grooming  PARTICIPATION LIMITATIONS: meal prep, cleaning, laundry, driving, shopping, community activity, and occupation  PERSONAL FACTORS: Age, Fitness, Past/current experiences, and Time since onset of injury/illness/exacerbation are also affecting patient's functional  outcome.   REHAB POTENTIAL: Good  CLINICAL DECISION MAKING: Evolving/moderate complexity  EVALUATION COMPLEXITY: Moderate   GOALS: Goals reviewed with patient? Yes  SHORT TERM GOALS: Target date: 12/13/2023  Pt will be ind with initial HEP Baseline: Goal status: ONGOING  2.  Pt will have improved elbow ROM to 10-135 deg Baseline:  Goal status:  GOAL MET  5/30  3.  Pt will have improved shoulder flexion and abd by at least 10 deg Baseline:  Goal status:  NOT MET     LONG TERM GOALS: Target date: 03/19/2024   Pt will be ind with management and progression of HEP Baseline:  Goal status: PROGRESSING  2.  Pt will have improved elbow ROM to at least 5-140 deg Baseline:  Goal status: 80%  MET  5/30  3.  Pt will have full and pain free shoulder elevation for overhead tasks Baseline:  Goal status: NOT MET  4.  Pt will be able to tolerate lifting and carrying at least 50# for work tasks Baseline:  Goal status: INITIAL  5.  Pt will have R UE strength to at least 90% of L UE strength for work tasks Baseline:  Goal status: PROGRESSING  7/8  6.  Pt will demo MCID on his QuickDASH or UEFI Baseline:  Goal status: NOT MET   PLAN:  PT FREQUENCY: 1-2x/wk  PT DURATION: 4-5 weeks  PLANNED INTERVENTIONS: 02835- PT Re-evaluation, 97110-Therapeutic exercises, 97530- Therapeutic activity, [id number]- Neuromuscular re-education, 97535- Self Care, 02859- Manual therapy, [id number]- Electrical stimulation (unattended), [phone]- Ionotophoresis 4mg /ml Dexamethasone , Patient/Family education, Taping, Dry Needling, Joint mobilization, Cryotherapy, and Moist heat  PLAN FOR NEXT SESSION:  Pt sees MD later this week.  Possible discharge next visit.  Review HEP and update as needed.   Leigh Minerva III PT, DPT 03/13/24 1:56 PM

## 2024-03-13 ENCOUNTER — Ambulatory Visit: Payer: Self-pay | Admitting: Family Medicine

## 2024-03-13 ENCOUNTER — Ambulatory Visit: Admitting: Family Medicine

## 2024-03-13 ENCOUNTER — Encounter (HOSPITAL_BASED_OUTPATIENT_CLINIC_OR_DEPARTMENT_OTHER): Payer: Self-pay | Admitting: Physical Therapy

## 2024-03-13 ENCOUNTER — Encounter: Payer: Self-pay | Admitting: Family Medicine

## 2024-03-13 VITALS — BP 122/80 | HR 80 | Resp 12 | Ht 68.0 in | Wt 157.4 lb

## 2024-03-13 DIAGNOSIS — E1122 Type 2 diabetes mellitus with diabetic chronic kidney disease: Secondary | ICD-10-CM

## 2024-03-13 DIAGNOSIS — I10 Essential (primary) hypertension: Secondary | ICD-10-CM

## 2024-03-13 DIAGNOSIS — Z7984 Long term (current) use of oral hypoglycemic drugs: Secondary | ICD-10-CM

## 2024-03-13 DIAGNOSIS — Z23 Encounter for immunization: Secondary | ICD-10-CM | POA: Diagnosis not present

## 2024-03-13 DIAGNOSIS — E559 Vitamin D deficiency, unspecified: Secondary | ICD-10-CM

## 2024-03-13 DIAGNOSIS — Z122 Encounter for screening for malignant neoplasm of respiratory organs: Secondary | ICD-10-CM

## 2024-03-13 DIAGNOSIS — E785 Hyperlipidemia, unspecified: Secondary | ICD-10-CM | POA: Diagnosis not present

## 2024-03-13 LAB — POCT GLYCOSYLATED HEMOGLOBIN (HGB A1C): Hemoglobin A1C: 8.6 % — AB (ref 4.0–5.6)

## 2024-03-13 LAB — LIPID PANEL
Cholesterol: 222 mg/dL — ABNORMAL HIGH (ref 0–200)
HDL: 48.6 mg/dL (ref >39.00)
LDL Cholesterol: 151 mg/dL — ABNORMAL HIGH (ref 0–99)
NonHDL: 173.13
Total CHOL/HDL Ratio: 5
Triglycerides: 110 mg/dL (ref 0.0–149.0)
VLDL: 22 mg/dL (ref 0.0–40.0)

## 2024-03-13 LAB — VITAMIN D 25 HYDROXY (VIT D DEFICIENCY, FRACTURES): VITD: 20.97 ng/mL — ABNORMAL LOW (ref 30.00–100.00)

## 2024-03-13 NOTE — Assessment & Plan Note (Signed)
 He has not been consistent with vitamin D  supplementation. Further recommendation will be given according to 25 OH vitamin D  result.

## 2024-03-13 NOTE — Patient Instructions (Addendum)
 A few things to remember from today's visit:  Type 2 diabetes mellitus with chronic kidney disease, without long-term current use of insulin , unspecified CKD stage (HCC) - Plan: POC HgB A1c, Cyclic citrul peptide antibody, IgG, Glutamic acid decarboxylase auto abs  Hypertension, unspecified type  Hyperlipidemia LDL goal <70  Vitamin D  deficiency, unspecified - Plan: VITAMIN D  25 Hydroxy (Vit-D Deficiency, Fractures)  Screening for lung cancer - Plan: Ambulatory Referral for Lung Cancer Scre  Estoy planeando en cambiar los medicamentos de diabetes por [employer]  y [employer] voy a esperar los resultados de los laboratorios.  If you need refills for medications you take chronically, please call your pharmacy. Do not use My Chart to request refills or for acute issues that need immediate attention. If you send a my chart message, it may take a few days to be addressed, specially if I am not in the office.  Please be sure medication list is accurate. If a new problem present, please set up appointment sooner than planned today.

## 2024-03-13 NOTE — Assessment & Plan Note (Signed)
 BP adequately controlled. Continue amlodipine  5 mg daily and low-salt diet. Monitor BP at home. He is overdue for eye exam.

## 2024-03-13 NOTE — Assessment & Plan Note (Signed)
 Problem is not well-controlled. Status post pancreatic transplant. Currently he is on Januvia 100 mg and glipizide 5 mg daily, before changing his medications to Farxiga  and Ozempic, we will check C-peptide and GAD. Regular exercise and healthy diet with avoidance of added sugar food intake is an important part of treatment and recommended. Will arrange diabetic/nutrition education. Annual eye exam, periodic dental and foot care recommended. F/U in 4 months.

## 2024-03-13 NOTE — Assessment & Plan Note (Signed)
 He is not on pharmacologic treatment. Continue low-fat diet. Further recommendation will be given according to lipid panel result.

## 2024-03-13 NOTE — Progress Notes (Signed)
 Chief Complaint  Patient presents with   Medical Management of Chronic Issues   Discussed the use of AI scribe software for clinical note transcription with the patient, who gave verbal consent to proceed.  History of Present Illness [name] is a 53 y.o. male, with a PMHx significant for  vitamin D  deficiency, HTN, DM II, chronic fatigue, chronic pain, ESRD S/P kidney/pancreas transplant in 2014, who is here today for chronic disease management.  Last seen on 11/13/2023.  Hypertension:  Medications: Amlodipine  5 mg daily and Lisinopril  10 mg daily His blood pressure is better controlled now compared to the last visit. Negative for unusual or severe headache, visual changes, exertional chest pain, dyspnea,  focal weakness, or edema.  BP Readings from Last 3 Encounters:  03/13/24 122/80  11/13/23 (!) 160/85  11/07/23 (!) 151/83   Lab Results  Component Value Date   CREATININE 1.16 11/07/2023   BUN 22 (H) 11/07/2023   NA 136 11/07/2023   K 4.9 11/07/2023   CL 105 11/07/2023   CO2 18 (L) 11/07/2023   Hyperlipidemia: Currently not on pharmacological treatment Lab Results  Component Value Date   CHOL 207 (A) 08/06/2021   HDL 29.50 (L) 07/17/2012   LDLCALC 143 08/06/2021   LDLDIRECT 142.0 07/17/2012   TRIG 97 08/06/2021   CHOLHDL 7 07/17/2012   Diabetes Mellitus II:  Initially type I S/P pancreatic transplant.Initially he was normoglycemic but HgA1C has gradually increased.  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  - Checking BG at home: Dexcom 7 CGM - his blood sugar levels at home have been around 200 mg/dL.  - Medications: Glipizide 5 mg twice daily and Januvia 100 mg daily - Diet: has not seen a nutritionist recently and has a diet high in carbohydrates. - eye exam: has not had an eye exam recently. - foot exam: 11/13/2023  - Negative for symptoms of hypoglycemia, polyuria,  polydipsia, foot ulcers/trauma He has a history of neuropathy but reports significant improvement with no recent symptoms of burning or tingling in his feet.  Lab Results  Component Value Date   HGBA1C 6.6 (A) 07/22/2022   Lab Results  Component Value Date   MICROALBUR 207.00 (H) 04/15/2008   End Stage Renal Disease: S/p kidney/pancreas transplant 04/02/2013 managed on Myfortic 720 mg twice daily, as well as Tacrolimus  twice daily, which he takes 5 mg of in the morning and 4 mg at night. Followed by [facility], who he last saw on 10/18/2023 w/ 6 month follow-up. Negative for gross hematuria, foam in urine, or decreased urine output.  Lab Results  Component Value Date   NA 136 11/07/2023   CL 105 11/07/2023   K 4.9 11/07/2023   CO2 18 (L) 11/07/2023   BUN 22 (H) 11/07/2023   CREATININE 1.16 11/07/2023   GFRNONAA >60 11/07/2023   CALCIUM  9.4 11/07/2023   PHOS 4.9 (H) 01/14/2012   ALBUMIN 4.5 08/06/2021   GLUCOSE 162 (H) 11/07/2023   Smoking Hx 1-2 PPD x22 years managed with Chantix  0.5 mg twice daily for cessation.   Followed by Dr. Elspeth Sea, Orthopedist for Right Humerus Fracture. S/p ORIF 03/07/2023 & Hardware Removal 11/07/2023, he has been through several weeks of PT following that. He reports improvement and hopes to return to work soon. He will see the orthopedist tomorrow.  Vit D deficiency: He also takes vitamin D  occasionally.  Lab Results  Component Value Date   VD25OH  29.09 (L) 07/24/2017   Review of Systems  Constitutional:  Positive for fatigue. Negative for activity change and appetite change.  HENT:  Negative for sore throat.   Respiratory:  Negative for cough and wheezing.   Gastrointestinal:  Negative for abdominal pain, nausea and vomiting.  Endocrine: Negative for cold intolerance and heat intolerance.  Genitourinary:  Negative for dysuria.  Musculoskeletal:  Positive for arthralgias.  Skin:  Negative for rash.  Neurological:   Negative for syncope and facial asymmetry.  Psychiatric/Behavioral:  Negative for confusion and hallucinations.   See other pertinent positives and negatives in HPI.  Current Outpatient Medications on File Prior to Visit  Medication Sig Dispense Refill   amLODipine  (NORVASC ) 5 MG tablet Take 1 tablet (5 mg total) by mouth at bedtime. 90 tablet 1   Blood Glucose Monitoring Suppl (ACCU-CHEK AVIVA PLUS) w/Device KIT As directed. 1 kit 0   Continuous Glucose Receiver (DEXCOM G7 RECEIVER) DEVI 1 Device by Does not apply route daily. 1 each 1   Continuous Glucose Sensor (DEXCOM G7 SENSOR) MISC 1 Device by Does not apply route daily. 12 each 1   glipiZIDE (GLUCOTROL) 5 MG tablet Take by mouth 2 (two) times daily before a meal.     JANUVIA 100 MG tablet Take 100 mg by mouth daily.     lisinopril  (ZESTRIL ) 10 MG tablet Take 1 tablet (10 mg total) by mouth daily. 90 tablet 2   mycophenolate (MYFORTIC) 180 MG EC tablet Take 4 tablets by mouth 2 (two) times daily.     oxyCODONE  (ROXICODONE ) 5 MG immediate release tablet Take 1 tablet (5 mg total) by mouth every 4 (four) hours as needed for severe pain (pain score 7-10) or breakthrough pain. 20 tablet 0   tacrolimus  (PROGRAF ) 1 MG capsule Take 5 mg by mouth 2 (two) times daily. Pt take 5 mg in the morning and 4 mg at night     varenicline  (CHANTIX ) 0.5 MG tablet Take 1 tablet (0.5 mg total) by mouth 2 (two) times daily. 60 tablet 0   [DISCONTINUED] diltiazem  (CARDIZEM ) 30 MG tablet Take 30 mg by mouth 4 (four) times daily. Ask patient to verify dosage and frequency. Not indicated on medical history form dated 10/23/10.      [DISCONTINUED] insulin  lispro (HUMALOG) 100 UNIT/ML injection Inject into the skin 3 (three) times daily before meals. Ask patient to verify name/dosage/. History form noted dosage of 34 units during day and 22 units at night.      No current facility-administered medications on file prior to visit.    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   Hypercholesterolemia    Hypertension    Metabolic acidosis    Recurrent peritonitis (HCC)    Type 1 diabetes with renal manifestations, controlled (HCC)    Vitamin D  deficiency     No Known Allergies  Social History   Socioeconomic History   Marital status: Married    Spouse name: [name]   Number of children: 4   Years of education: 10   Highest education level: Not on file  Occupational History   Occupation: Education administrator  Tobacco Use   Smoking status: Every Day  Current packs/day: 1.00    Average packs/day: 1 pack/day for 22.0 years (22.0 ttl pk-yrs)    Types: Cigarettes   Smokeless tobacco: Never   Tobacco comments:    Down to 1/2 ppd with chantix   Vaping Use   Vaping status: Former   Substances: Nicotine   Substance and Sexual Activity   Alcohol  use: Yes    Alcohol /week: 0.0 standard drinks of alcohol     Comment: socially   Drug use: No   Sexual activity: Yes  Other Topics Concern   Not on file  Social History Narrative   Paints houses   Wife works at a Retail banker at [employer]   4 children age 45 to 36   Regular exercise-no current exercise regimen   Caffeine Use-no   Social Drivers of Corporate investment banker Strain: Not on file  Food Insecurity: Not on file  Transportation Needs: Not on file  Physical Activity: Not on file  Stress: Not on file  Social Connections: Not on file   Today's Vitals   03/13/24 0858  BP: 122/80  Pulse: 80  Resp: 12  SpO2: 96%  Weight: 157 lb 6 oz (71.4 kg)  Height: 5' 8 (1.727 m)   Body mass index is 23.93 kg/m.  Physical Exam Vitals and nursing note reviewed.  Constitutional:      General: He is not in acute  distress.    Appearance: He is well-developed.  HENT:     Head: Normocephalic and atraumatic.     Mouth/Throat:     Mouth: Mucous membranes are moist.     Pharynx: Oropharynx is clear.  Eyes:     Conjunctiva/sclera: Conjunctivae normal.  Cardiovascular:     Rate and Rhythm: Normal rate and regular rhythm.     Pulses:          Dorsalis pedis pulses are 2+ on the right side and 2+ on the left side.     Heart sounds: No murmur heard. Pulmonary:     Effort: Pulmonary effort is normal. No respiratory distress.     Breath sounds: Normal breath sounds.  Abdominal:     Palpations: Abdomen is soft. There is no hepatomegaly or mass.     Tenderness: There is no abdominal tenderness.  Lymphadenopathy:     Cervical: No cervical adenopathy.  Skin:    General: Skin is warm.     Findings: No erythema or rash.  Neurological:     Mental Status: He is alert and oriented to person, place, and time.     Cranial Nerves: No cranial nerve deficit.     Gait: Gait normal.  Psychiatric:        Mood and Affect: Mood and affect normal.   ASSESSMENT AND PLAN: Mr.Burman Heney was seen here today for chronic disease management.  Orders Placed This Encounter  Procedures   Pneumococcal conjugate vaccine 20-valent (Prevnar 20)   Cyclic citrul peptide antibody, IgG   Glutamic acid decarboxylase auto abs   VITAMIN D  25 Hydroxy (Vit-D Deficiency, Fractures)   Lipid panel   Ambulatory Referral for Lung Cancer Scre   Amb Referral to Nutrition and Diabetic Education   POC HgB A1c   Lab Results  Component Value Date   HGBA1C 8.6 (A) 03/13/2024   Lab Results  Component Value Date   CHOL 222 (H) 03/13/2024   HDL 48.60 03/13/2024   LDLCALC 151 (H) 03/13/2024   LDLDIRECT 142.0 07/17/2012   TRIG 110.0 03/13/2024   CHOLHDL 5 03/13/2024  Lab Results  Component Value Date   VD25OH 20.97 (L) 03/13/2024   Type 2 diabetes mellitus with chronic kidney disease, without long-term current use of insulin ,  unspecified CKD stage (HCC) Assessment & Plan: Problem is not well-controlled. Status post pancreatic transplant. Currently he is on Januvia 100 mg and glipizide 5 mg daily, before changing his medications to Farxiga  and Ozempic, we will check C-peptide and GAD. Regular exercise and healthy diet with avoidance of added sugar food intake is an important part of treatment and recommended. Will arrange diabetic/nutrition education. Annual eye exam, periodic dental and foot care recommended. F/U in 4 months.  Orders: -     POCT glycosylated hemoglobin (Hb A1C) -     Cyclic citrul peptide antibody, IgG; Future -     Glutamic acid decarboxylase auto abs; Future -     Pneumococcal conjugate vaccine 20-valent -     Amb Referral to Nutrition and Diabetic Education  Hypertension, unspecified type Assessment & Plan: BP adequately controlled. Continue amlodipine  5 mg daily and low-salt diet. Monitor BP at home. He is overdue for eye exam.   Hyperlipidemia LDL goal <70 Assessment & Plan: He is not on pharmacologic treatment. Continue low-fat diet. Further recommendation will be given according to lipid panel result.  Orders: -     Lipid panel; Future  Vitamin D  deficiency, unspecified Assessment & Plan: He has not been consistent with vitamin D  supplementation. Further recommendation will be given according to 25 OH vitamin D  result.  Orders: -     VITAMIN D  25 Hydroxy (Vit-D Deficiency, Fractures); Future  Screening for lung cancer -     Ambulatory Referral for Lung Cancer Scre  Need for pneumococcal vaccination -     Pneumococcal conjugate vaccine 20-valent  Return in about 18 weeks (around 07/17/2024) for chronic problems.  I,Emily Lagle,acting as a Neurosurgeon for Oswin Griffith [name], MD.,have documented all relevant documentation on the behalf of Solyana Nonaka [name], MD,as directed by  Mela Perham [name], MD while in the presence of Suleima Ohlendorf [name], MD.  I, Tayshaun Kroh [name], MD, have reviewed all  documentation for this visit. The documentation on 03/13/24 for the exam, diagnosis, procedures, and orders are all accurate and complete.  Rehmat Murtagh [name], MD [facility] [place] office.

## 2024-03-14 ENCOUNTER — Encounter (HOSPITAL_BASED_OUTPATIENT_CLINIC_OR_DEPARTMENT_OTHER): Payer: Self-pay

## 2024-03-14 ENCOUNTER — Ambulatory Visit (HOSPITAL_BASED_OUTPATIENT_CLINIC_OR_DEPARTMENT_OTHER): Payer: Worker's Compensation

## 2024-03-14 ENCOUNTER — Ambulatory Visit (HOSPITAL_BASED_OUTPATIENT_CLINIC_OR_DEPARTMENT_OTHER): Payer: Self-pay

## 2024-03-14 ENCOUNTER — Ambulatory Visit (INDEPENDENT_AMBULATORY_CARE_PROVIDER_SITE_OTHER): Payer: Worker's Compensation | Admitting: Orthopaedic Surgery

## 2024-03-14 DIAGNOSIS — M6281 Muscle weakness (generalized): Secondary | ICD-10-CM

## 2024-03-14 DIAGNOSIS — M25621 Stiffness of right elbow, not elsewhere classified: Secondary | ICD-10-CM | POA: Diagnosis not present

## 2024-03-14 DIAGNOSIS — M25511 Pain in right shoulder: Secondary | ICD-10-CM

## 2024-03-14 DIAGNOSIS — S42491A Other displaced fracture of lower end of right humerus, initial encounter for closed fracture: Secondary | ICD-10-CM

## 2024-03-14 DIAGNOSIS — M25521 Pain in right elbow: Secondary | ICD-10-CM

## 2024-03-14 MED ORDER — TRIAMCINOLONE ACETONIDE 40 MG/ML IJ SUSP
80.0000 mg | INTRAMUSCULAR | Status: AC | PRN
  Administered 2024-03-14: 80 mg via INTRA_ARTICULAR

## 2024-03-14 MED ORDER — LIDOCAINE HCL 1 % IJ SOLN
4.0000 mL | INTRAMUSCULAR | Status: AC | PRN
  Administered 2024-03-14: 4 mL

## 2024-03-14 NOTE — Progress Notes (Addendum)
 Post Operative Evaluation    Procedure/Date of Surgery: Right distal humerus removal of hardware with ulnar neurolysis 4/1  Interval History:   Presents today for follow-up of the shoulder.  At this time he is working on range of motion.  He did get improvement from his previous injection.   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   Hypercholesterolemia    Hypertension    Metabolic acidosis    Recurrent peritonitis (HCC)    Type 1 diabetes with renal manifestations, controlled (HCC)    Vitamin D  deficiency    Past Surgical History:  Procedure Laterality Date   AV FISTULA PLACEMENT  01/10/2012   Procedure: ARTERIOVENOUS (AV) FISTULA CREATION;  Surgeon: Carlin FORBES Haddock, MD;  [facility] OR;  Service: Vascular;  Laterality: Left;  Left Brachial Cephalic Arteriovenous Fistula   CAPD INSERTION  01/09/2012   Procedure: CONTINUOUS AMBULATORY PERITONEAL DIALYSIS  (CAPD) CATHETER INSERTION;  Surgeon: Elon CHRISTELLA Pacini, MD;  Location: [facility] OR;  Service: General;  Laterality: N/A;  Exteriorization PD Cath   CAPSULOTOMY Right 11/07/2023   Procedure: CAPSULOTOMY, ELBOW;  Surgeon: Genelle Standing, MD;  Location: [facility];  Service: Orthopedics;  Laterality: Right;  RIGHT ELBOW CAPSULAR RELEASE   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   received left kidney, 3 renal arteries, 1 renal vein, ureter and pancreas from 53 year old   HARDWARE REMOVAL Right 11/07/2023   Procedure: REMOVAL, HARDWARE;  Surgeon: Genelle Standing, MD;  Location:  SURGERY CENTER;  Service: Orthopedics;   Laterality: Right;   INSERTION OF DIALYSIS CATHETER  01/13/2012   Procedure: INSERTION OF DIALYSIS CATHETER;  Surgeon: Krystal JULIANNA Doing, MD;  [facility] OR;  Service: Vascular;  Laterality: Right;  Internal Jugular   LAPAROSCOPY  03/13/2012   Procedure: LAPAROSCOPY DIAGNOSTIC;  Surgeon: Standing KYM Schultze, MD;  Location: [facility] OR;  Service: General;  Laterality: N/A;   LIVER BIOPSY  04/02/2013   open   ORIF HUMERUS FRACTURE Right 03/07/2023   Procedure: OPEN REDUCTION INTERNAL FIXATION (ORIF) DISTAL HUMERUS FRACTURE;  Surgeon: Genelle Standing, MD;  Location: [facility] ORS;  Service: Orthopedics;  Laterality: Right;   PERITONEAL CATHETER INSERTION     PERITONEAL CATHETER REMOVAL     SMALL INTESTINE SURGERY  04/02/2013   TRICEPS TENDON REPAIR  03/07/2023   Procedure: TRICEPS TENDON REPAIR;  Surgeon: Genelle Standing, MD;  Location: [facility] ORS;  Service: Orthopedics;;   ULNAR NERVE TRANSPOSITION Right 03/07/2023   Procedure: ULNAR NERVE EXPLORATION;  Surgeon: Genelle Standing, MD;  Location: [facility] ORS;  Service: Orthopedics;  Laterality: Right;   Social History   Socioeconomic History   Marital status: Married    Spouse name: [name]   Number of children: 4   Years of education: 10   Highest education level: Not on file  Occupational History   Occupation: Education administrator  Tobacco Use   Smoking status: Every Day    Current packs/day: 1.00    Average packs/day: 1 pack/day for  22.0 years (22.0 ttl pk-yrs)    Types: Cigarettes   Smokeless tobacco: Never   Tobacco comments:    Down to 1/2 ppd with chantix   Vaping Use   Vaping status: Former   Substances: Nicotine   Substance and Sexual Activity   Alcohol  use: Yes    Alcohol /week: 0.0 standard drinks of alcohol     Comment: socially   Drug use: No   Sexual activity: Yes  Other Topics Concern   Not on file  Social History Narrative   Paints houses   Wife works at a Retail banker at [employer]   4 children age 64 to 37   Regular exercise-no current exercise regimen    Caffeine Use-no   Social Drivers of Corporate investment banker Strain: Not on file  Food Insecurity: Not on file  Transportation Needs: Not on file  Physical Activity: Not on file  Stress: Not on file  Social Connections: Not on file   Family History  Problem Relation Age of Onset   Diabetes Mother    Hypertension Mother    Hyperlipidemia Mother    Cancer Father        glioblastoma   No Known Allergies Current Outpatient Medications  Medication Sig Dispense Refill   amLODipine  (NORVASC ) 5 MG tablet Take 1 tablet (5 mg total) by mouth at bedtime. 90 tablet 1   Blood Glucose Monitoring Suppl (ACCU-CHEK AVIVA PLUS) w/Device KIT As directed. 1 kit 0   Continuous Glucose Receiver (DEXCOM G7 RECEIVER) DEVI 1 Device by Does not apply route daily. 1 each 1   Continuous Glucose Sensor (DEXCOM G7 SENSOR) MISC 1 Device by Does not apply route daily. 12 each 1   glipiZIDE (GLUCOTROL) 5 MG tablet Take by mouth 2 (two) times daily before a meal.     JANUVIA 100 MG tablet Take 100 mg by mouth daily.     lisinopril  (ZESTRIL ) 10 MG tablet Take 1 tablet (10 mg total) by mouth daily. 90 tablet 2   mycophenolate (MYFORTIC) 180 MG EC tablet Take 4 tablets by mouth 2 (two) times daily.     oxyCODONE  (ROXICODONE ) 5 MG immediate release tablet Take 1 tablet (5 mg total) by mouth every 4 (four) hours as needed for severe pain (pain score 7-10) or breakthrough pain. 20 tablet 0   tacrolimus  (PROGRAF ) 1 MG capsule Take 5 mg by mouth 2 (two) times daily. Pt take 5 mg in the morning and 4 mg at night     varenicline  (CHANTIX ) 0.5 MG tablet Take 1 tablet (0.5 mg total) by mouth 2 (two) times daily. 60 tablet 0   No current facility-administered medications for this visit.   No results found.   Review of Systems:   A ROS was performed including pertinent positives and negatives as documented in the HPI.   Musculoskeletal Exam:    There were no vitals taken for this visit.  Right incision is  well-appearing which is subsequently healed..  Range of motion is from 5 degrees to 140 degrees.  Sensation is intact to light touch throughout.  Fires ulnar interosseous nerve distribution as well as radial nerve with strong wrist extension and 2+ radial pulse  Right shoulder is to approximately 120 degrees forward elevation, external rotation at the side is to 50 degrees, internal rotation is to L5.   Imaging:     I personally reviewed and interpreted the radiographs.   Assessment:   53 year old male with evidence of right shoulder adhesive capsulitis which did well  with an injection.  I did discuss that I do not want to repeat a steroid injection and instead would favor Toradol given the fact that his A1c has been elevated in recent history here.  At this time we will plan for additional physical therapy for the right shoulder as well to help him work through the frozen shoulder  With regard to his right elbow I do believe he has achieved maximal medical improvement at this time and at this time I would not plan to keep him out of work from this perspective.  Plan :    - Right shoulder ultrasound-guided injection Friday after work send obtain    Procedure Note  Patient: Mark Miranda             Date of Birth: [birth year]           MRN: [id number]             Visit Date: 03/14/2024  Procedures: Visit Diagnoses:  1. Other closed displaced fracture of distal end of right humerus, initial encounter     Large Joint Inj: R glenohumeral on 03/14/2024 10:30 AM Indications: pain Details: 22 G 1.5 in needle, ultrasound-guided anterior approach  Arthrogram: No  Medications: 4 mL lidocaine  1 %; 80 mg triamcinolone  acetonide 40 MG/ML Outcome: tolerated well, no immediate complications Procedure, treatment alternatives, risks and benefits explained, specific risks discussed. Consent was given by the patient. Immediately prior to procedure a time out was called to verify the correct  patient, procedure, equipment, support staff and site/side marked as required. Patient was prepped and draped in the usual sterile fashion.        Elspeth Parker, MD Attending Physician, Orthopedic Surgery  This document was dictated using Dragon voice recognition software. A reasonable attempt at proof reading has been made to minimize errors.

## 2024-03-14 NOTE — Therapy (Signed)
 OUTPATIENT PHYSICAL THERAPY TREATMENT     Patient Name: Mark Miranda MRN: [id number] DOB:22-Sep-1970, 53 y.o., male Today's Date: 03/14/2024  END OF SESSION:  PT End of Session - 03/14/24 1129     Visit Number 23    Number of Visits 23    Date for PT Re-Evaluation 03/19/24    Authorization Type Worker's Comp    PT Start Time 1103    PT Stop Time 1145    PT Time Calculation (min) 42 min    Activity Tolerance Patient tolerated treatment well    Behavior During Therapy WFL for tasks assessed/performed                         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   Hypercholesterolemia    Hypertension    Metabolic acidosis    Recurrent peritonitis (HCC)    Type 1 diabetes with renal manifestations, controlled (HCC)    Vitamin D  deficiency    Past Surgical History:  Procedure Laterality Date   AV FISTULA PLACEMENT  01/10/2012   Procedure: ARTERIOVENOUS (AV) FISTULA CREATION;  Surgeon: Carlin FORBES Haddock, MD;  Location: [facility] OR;  Service: Vascular;  Laterality: Left;  Left Brachial Cephalic Arteriovenous Fistula   CAPD INSERTION  01/09/2012   Procedure: CONTINUOUS AMBULATORY PERITONEAL DIALYSIS  (CAPD) CATHETER INSERTION;  Surgeon: Elon CHRISTELLA Pacini, MD;  Location: [facility] OR;  Service: General;  Laterality: N/A;  Exteriorization PD Cath   CAPSULOTOMY Right 11/07/2023   Procedure: CAPSULOTOMY, ELBOW;  Surgeon: Genelle Standing, MD;  Location: [facility];  Service: Orthopedics;  Laterality: Right;  RIGHT ELBOW CAPSULAR RELEASE   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   received left kidney, 3 renal arteries, 1 renal vein, ureter and pancreas from 53  year old   HARDWARE REMOVAL Right 11/07/2023   Procedure: REMOVAL, HARDWARE;  Surgeon: Genelle Standing, MD;  Location: [facility];  Service: Orthopedics;  Laterality: Right;   INSERTION OF DIALYSIS CATHETER  01/13/2012   Procedure: INSERTION OF DIALYSIS CATHETER;  Surgeon: Krystal JULIANNA Doing, MD;  Location: [facility] OR;  Service: Vascular;  Laterality: Right;  Internal Jugular   LAPAROSCOPY  03/13/2012   Procedure: LAPAROSCOPY DIAGNOSTIC;  Surgeon: Standing KYM Schultze, MD;  Location: [facility] OR;  Service: General;  Laterality: N/A;   LIVER BIOPSY  04/02/2013   open   ORIF HUMERUS FRACTURE Right 03/07/2023   Procedure: OPEN REDUCTION INTERNAL FIXATION (ORIF) DISTAL HUMERUS FRACTURE;  Surgeon: Genelle Standing, MD;  Location: [facility] ORS;  Service: Orthopedics;  Laterality: Right;   PERITONEAL CATHETER INSERTION     PERITONEAL CATHETER REMOVAL     SMALL INTESTINE SURGERY  04/02/2013   TRICEPS TENDON REPAIR  03/07/2023   Procedure: TRICEPS TENDON REPAIR;  Surgeon: Genelle Standing, MD;  Location: [facility] ORS;  Service: Orthopedics;;   ULNAR NERVE TRANSPOSITION Right 03/07/2023   Procedure: ULNAR NERVE EXPLORATION;  Surgeon: Genelle Standing, MD;  Location: [facility] ORS;  Service: Orthopedics;  Laterality: Right;   Patient Active Problem List   Diagnosis Date Noted   Hyperlipidemia LDL goal <70 11/13/2023   Closed fracture of right distal humerus 03/07/2023   Closed bicondylar  fracture of distal end of right humerus 03/04/2023   Type 2 diabetes mellitus with diabetic chronic kidney disease (HCC) 07/22/2022   Depression, major, 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/2022   Erectile dysfunction 07/24/2017   Vitamin D  deficiency, unspecified 07/24/2017   Low back pain 02/25/2016   Chronic fatigue 01/25/2016   Diabetic peripheral neuropathy (HCC) 11/24/2015   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   Depression 11/02/2015   Tobacco abuse 11/02/2015   End stage renal disease (HCC)  12/29/2011   HTN (hypertension) 01/25/2011    PCP: [name], [name], MD  REFERRING PROVIDER: Genelle Standing, MD  REFERRING DIAG: [phone] (ICD-10-CM) - Other closed displaced fracture of distal end of right humerus, initial encounter  THERAPY DIAG:  Stiffness of right elbow, not elsewhere classified  Pain in right elbow  Muscle weakness (generalized)  Right shoulder pain, unspecified chronicity  Rationale for Evaluation and Treatment: Rehabilitation  ONSET DATE: 11/07/23 R distal humeral elbow hardware removal  SUBJECTIVE:   SUBJECTIVE STATEMENT: Pt reports MD injected his shoulder at appt this morning. MD wants additional PT visits for shoulder per pt.     PERTINENT HISTORY: Per 11/16/23: Status post right distal humeral elbow hardware removal.  At this time he is progressing with range of motion.  I would like him to work with physical therapy to progress his flexion and extension.   PAIN:  Are you having pain? Yes: NPRS scale:  1/10 R elbow, 1/10 shoulder pain  Pain location: see above Pain description: sharp Aggravating factors:   Relieving factors:     PRECAUTIONS: None  RED FLAGS: None   WEIGHT BEARING RESTRICTIONS: No  FALLS:  Has patient fallen in last 6 months? 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  Pt wants full strength of his arm and perform act's without pain.  NEXT MD VISIT: 11/23/23 for suture removal  OBJECTIVE:  Note: Objective measures were completed at Evaluation unless otherwise noted.  DIAGNOSTIC FINDINGS: R elbow x-ray 10/29/23 IMPRESSION: 1. Status post ORIF of the distal humerus without evidence of hardware complication. 2. Several calcific densities along the  palmar aspect of the elbow joint may reflect loose bodies.   COGNITION: Overall cognitive status: Within functional limits for tasks assessed      MUSCLE LENGTH: Biceps: ROM below    UPPER EXTREMITY ROM:  Active ROM (measured in supine) Right eval Left eval Right 5/16 Right 5/27  Left 5/27 Right 5/30 Right 7/8 Left 7/8  Shoulder flexion 145*   148 with pain   143 with pain 149  Shoulder scaption       125   Shoulder abduction 85*   118 with pain 157  88 with pain 150  Shoulder adduction          Shoulder extension          Shoulder internal rotation 80         Shoulder external rotation 75         Elbow flexion 115 145 143 144   143   Elbow extension -20 3 -12/-10 AROM/PROM -15/-11 AROM/PROM  -10 AROM -10 AROM   Wrist flexion  Wrist extension          Wrist ulnar deviation          Wrist radial deviation          Wrist pronation      WFL bilat    Wrist supination      WFL bilat     (Blank rows = not tested, * = pain)  UPPER EXTREMITY MMT:  MMT Right eval Left eval Right 5/30 Comparison 5/30 Right 7/8 compcarison  Shoulder flexion 4       Shoulder extension        Shoulder abduction 4-       Shoulder adduction        Shoulder extension        Shoulder internal rotation 4-       Shoulder external rotation 3+       Middle trapezius        Lower trapezius        Elbow flexion (90 deg) 24.7, 23.6 38.3, 40.7 29.9, 29.5 R  73% of L 34 R 84% of L  Elbow extension (90 deg) 12.9, 9.2 21.9, 28.3 19.8 R 70% of L 20.5 with pain R 72% of L  Wrist flexion        Wrist extension        Wrist ulnar deviation        Wrist radial deviation        Wrist pronation   5/5     Wrist supination   5/5 with shoulder pain     Grip strength 35 lbs 55 lbs 49 lbs      (Blank rows = not tested)                                                                                                                                TREATMENT DATE:   8/7 UBE x 4 mins L2 (fwd/bwd  2 min each)  Pt received R shoulder PROM in flexion, scaption, and ER in supine per pt and tissue tolerance. Pt received R elbow flex and extension  Supine rhythmic stab's at 90deg 2x30 sec each Supine active shoulder flexion 0# 2x10 Prone triceps extension 3# 2x10 Prone shoulder extension 2# 3x10 Prone row 6# 2x10 Bicep curl 8# 2x15 Theraband row Black TB x30 Standing triceps extension with GTB 3x10  4D ball rolls on wall 2x10 each  8/5 UBE x 4 mins L2 (fwd/bwd 2 min each)  Pt received R shoulder PROM in flexion, scaption, and ER in supine per pt and tissue tolerance. Pt received R elbow flex and extension and forearm pronation and supination in supine per pt and tissue tolerance.   Supine rhythmic stab's at 90/60/120 deg 2x30 sec each S/L ER x15, 1# 2x12 Prone shoulder extension 3# 3x10 Prone horizontal abd 2x10 Theraband row Black TB x30 Standing triceps extension with GTB 3x10  (L UE holding other end of the GTB)  4D ball rolls on wall x10 each      7/30 UBE x 4 mins L2 (fwd/bwd 2 min each)   BP: 158/91 HR:  73   Pt received R shoulder PROM in flexion, scaption, and ER in supine per pt and tissue tolerance. Pt received R elbow flex and extension and forearm pronation and supination in supine per pt and tissue tolerance.    Supine rhythmic stab's at 60/90/120 deg 2 x 30 sec each S/L ER 2x10  7/23 UBE x 4 min L2 (fwd/back each) R shoulder PROM, R elbow PROM STM deltoid, UT Supine rhythmic stabilization R shoulder Scapular mobilizations in sidelying S/l windmill stretch (some pain) STM R UT S/l ER 2x10 Elbow flexion 5# 3x 10 Prone row 8#  2x15 Prone shoulder extension 3# 3 x 10 Prone flexion 0# 3x10 (tactile scapular cuing) Prone horizontal abduction 0# 2x10 (tactile scapular cuing)  7/16 UBE x 4 min L2 (fwd/back each) R shoulder PROM, R elbow PROM Supine rhythmic stabilization R shoulder S/l ER 2x10 Theraband row Black TB x30 Theraband  shoulder extension Blue TB x30  Elbow flexion 5# 3x 15 Hammer curl 5# 3x 15 Prone row 8#  2x15 Prone shoulder extension 3# 3 x 10 Prone flexion 2# 3x10 Prone triceps extension 4# 2 x 10   7/14 UBE x 3.5 mins lvl 2  Pt received R UE PROM including elbow flexion/extension, forearm pronation/supination, and shoulder ER, flexion, and abduction in supine.   Supine rhythmic stab's 3x30 sec at 90 deg shoulder flexion  S/L ER 2x10 Prone row 3# 2x12 Prone shoulder extension 2# 3x10-12 4D ball rolls on wall approx 15 each Attempted Standing Triceps extension with GTB though stopped due to pain Prone triceps extension 3# x 10, 4#  2x10 Pulleys in flexion abduction x 20 each     PATIENT EDUCATION:  Education details: Exercise form, POC, HEP.   Person educated: Patient Education method: Explanation, Demonstration, verbal and tactile cues Education comprehension: verbalized understanding, returned demonstration, and needs further education, verbal and tactile cues required  HOME EXERCISE PROGRAM: Access Code: [email or web address] URL: [email or web address] Date: 11/22/2023 Prepared by: Gellen April Earnie Starring   ASSESSMENT:  CLINICAL IMPRESSION:  Pt denied increase in pain with triceps focused exercises today. Reported crepitus in shoulder with supine active flexion. Able to tolerate increased weights with bicep curls. Pt most limited by heavier functional activities due to c/o shoulder pain. Will plan to have PT contact MD regarding script for R shoulder to continue with focus on this.    OBJECTIVE IMPAIRMENTS: decreased coordination, decreased mobility, decreased ROM, decreased strength, hypomobility, increased edema, increased fascial restrictions, increased muscle spasms, impaired flexibility, impaired UE functional use, improper body mechanics, postural dysfunction, and pain.   ACTIVITY LIMITATIONS: carrying, lifting, bathing, toileting, dressing, reach over head, and  hygiene/grooming  PARTICIPATION LIMITATIONS: meal prep, cleaning, laundry, driving, shopping, community activity, and occupation  PERSONAL FACTORS: Age, Fitness, Past/current experiences, and Time since onset of injury/illness/exacerbation are also affecting patient's functional outcome.   REHAB POTENTIAL: Good  CLINICAL DECISION MAKING: Evolving/moderate complexity  EVALUATION COMPLEXITY: Moderate   GOALS: Goals reviewed with patient? Yes  SHORT TERM GOALS: Target date: 12/13/2023  Pt will be ind with initial HEP Baseline: Goal status: ONGOING  2.  Pt will have improved elbow ROM to 10-135 deg Baseline:  Goal status:  GOAL MET  5/30  3.  Pt will have improved shoulder flexion and abd by at least 10 deg Baseline:  Goal status:  NOT MET     LONG TERM GOALS: Target date: 03/19/2024   Pt will be ind with management and progression of HEP Baseline:  Goal status: PROGRESSING  2.  Pt will have improved elbow ROM to at least 5-140 deg Baseline:  Goal status: 80%  MET  5/30  3.  Pt will have full and pain free shoulder elevation for overhead tasks Baseline:  Goal status: NOT MET  4.  Pt will be able to tolerate lifting and carrying at least 50# for work tasks Baseline:  Goal status: INITIAL  5.  Pt will have R UE strength to at least 90% of L UE strength for work tasks Baseline:  Goal status: PROGRESSING  7/8  6.  Pt will demo MCID on his QuickDASH or UEFI Baseline:  Goal status: NOT MET   PLAN:  PT FREQUENCY: 1-2x/wk  PT DURATION: 4-5 weeks  PLANNED INTERVENTIONS: 97164- PT Re-evaluation, 97110-Therapeutic exercises, 97530- Therapeutic activity, [id number]- Neuromuscular re-education, 97535- Self Care, [id number]- Manual therapy, [id number]- Electrical stimulation (unattended), 97033- Ionotophoresis 4mg /ml Dexamethasone , Patient/Family education, Taping, Dry Needling, Joint mobilization, Cryotherapy, and Moist heat  PLAN FOR NEXT SESSION:  Pt sees MD later this week.   Possible discharge next visit.  Review HEP and update as needed.   Asberry Rodes, PTA  03/14/24 1:32 PM

## 2024-03-14 NOTE — Addendum Note (Signed)
 Addended by: WOLFGANG CONLEY HERO on: 03/14/2024 12:43 PM   Modules accepted: Orders

## 2024-03-18 ENCOUNTER — Ambulatory Visit (HOSPITAL_BASED_OUTPATIENT_CLINIC_OR_DEPARTMENT_OTHER): Payer: Worker's Compensation | Attending: Orthopaedic Surgery

## 2024-03-18 ENCOUNTER — Encounter: Attending: Family Medicine | Admitting: Dietician

## 2024-03-18 ENCOUNTER — Encounter (HOSPITAL_BASED_OUTPATIENT_CLINIC_OR_DEPARTMENT_OTHER): Payer: Self-pay

## 2024-03-18 ENCOUNTER — Other Ambulatory Visit (HOSPITAL_BASED_OUTPATIENT_CLINIC_OR_DEPARTMENT_OTHER): Payer: Self-pay | Admitting: Orthopaedic Surgery

## 2024-03-18 ENCOUNTER — Encounter: Payer: Self-pay | Admitting: Dietician

## 2024-03-18 VITALS — Ht 68.0 in | Wt 158.0 lb

## 2024-03-18 DIAGNOSIS — S42491A Other displaced fracture of lower end of right humerus, initial encounter for closed fracture: Secondary | ICD-10-CM | POA: Insufficient documentation

## 2024-03-18 DIAGNOSIS — R600 Localized edema: Secondary | ICD-10-CM | POA: Diagnosis present

## 2024-03-18 DIAGNOSIS — E118 Type 2 diabetes mellitus with unspecified complications: Secondary | ICD-10-CM | POA: Diagnosis present

## 2024-03-18 DIAGNOSIS — M25511 Pain in right shoulder: Secondary | ICD-10-CM | POA: Insufficient documentation

## 2024-03-18 DIAGNOSIS — M25521 Pain in right elbow: Secondary | ICD-10-CM | POA: Insufficient documentation

## 2024-03-18 DIAGNOSIS — M25621 Stiffness of right elbow, not elsewhere classified: Secondary | ICD-10-CM | POA: Diagnosis present

## 2024-03-18 DIAGNOSIS — M6281 Muscle weakness (generalized): Secondary | ICD-10-CM | POA: Diagnosis present

## 2024-03-18 NOTE — Progress Notes (Signed)
 Diabetes Self-Management Education  Visit Type: First/Initial  Appt. Start Time: 1435 Appt. End Time: 1535  03/18/2024  Mark Miranda, identified by name and date of birth, is a 53 y.o. male with a diagnosis of Diabetes: Type 1.   ASSESSMENT  History includes:  ESRD s/p Kidney and pancrease transplant (2014), Type 1 Diabetes (1989), HLD, HTN, neuropathy (does not have symptoms currently), depression, herniated disk Labs:  8.6% on 03/13/2024 increased from 6.6% 07/22/2022, BUN 22, Creatinine 1.16, Potassium 4.9, eGFR >60 11/07/2023, Vitamin D  20 on 03/13/2024 Lipid Panel     Component Value Date/Time   CHOL 222 (H) 03/13/2024 0950   TRIG 110.0 03/13/2024 0950   HDL 48.60 03/13/2024 0950   CHOLHDL 5 03/13/2024 0950   VLDL 22.0 03/13/2024 0950   LDLCALC 151 (H) 03/13/2024 0950   LDLDIRECT 142.0 07/17/2012 1520   Medication:  glipizide, Januvia  CGM:  Dexcom not covered by his insurance He checks his blood glucose  once per week and fasting blood glucose was180-190 this am  68 158 lbs 03/18/2024   Patient lives with his wife, daughter, and son.  He does most of the cooking as he is currently out of work.  States they eat fast food most frequently when he is working as he and his wife work so much. 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 Was working 12 hour shifts. Smokes and vapes. Walking hurts his calves.  Height 5' 8 (1.727 m), weight 158 lb (71.7 kg). Body mass index is 24.02 kg/m.   Diabetes Self-Management Education - 03/18/24 1614       Visit Information   Visit Type First/Initial      Initial Visit   Diabetes Type Type 1    Date Diagnosed 2014    Are you currently following a meal plan? No    Are you taking your medications as prescribed? Yes      Health Coping   How would you rate your overall health? Good      Psychosocial Assessment   Patient Belief/Attitude about Diabetes Defeat/Burnout     What is the hardest part about your diabetes right now, causing you the most concern, or is the most worrisome to you about your diabetes?   Making healty food and beverage choices;Being active    Self-care barriers None    Self-management support Doctor's office    Other persons present Patient    Patient Concerns Nutrition/Meal planning;Healthy Lifestyle;Glycemic Control    Special Needs None    Preferred Learning Style No preference indicated    Learning Readiness Ready    What is the last grade level you completed in school? 9      Pre-Education Assessment   Patient understands the diabetes disease and treatment process. Needs Review    Patient understands incorporating nutritional management into lifestyle. Needs Review    Patient undertands incorporating physical activity into lifestyle. Needs Review    Patient understands using medications safely. Needs Review    Patient understands monitoring blood glucose, interpreting and using results Needs Review    Patient understands prevention, detection, and treatment of acute complications. Needs Review    Patient understands prevention, detection, and treatment of chronic complications. Needs Review    Patient understands how to develop strategies to address psychosocial issues. Needs Review    Patient understands how to develop strategies to promote health/change behavior. Needs Review  Complications   Last HgB A1C per patient/outside source 8.6 %   03/13/2024   How often do you check your blood sugar? --   once per week   Fasting Blood glucose range (mg/dL) 819-799    Number of hypoglycemic episodes per month 0    Have you had a dilated eye exam in the past 12 months? No    Have you had a dental exam in the past 12 months? No    Are you checking your feet? Yes    How many days per week are you checking your feet? 7      Dietary Intake   Breakfast 2 eggs, 2 corn tortillas, 3 strips bacon, tomatoes, onions    Snack (morning) none     Lunch none today but usually leftovers    Snack (afternoon) corn chips    Dinner chicken with skin, fries    Snack (evening) carrots and ranch or apple or ham, cheese and tomato sandwich    Beverage(s) water, coffee with cream and splenda, diet Monster (1)      Activity / Exercise   Activity / Exercise Type Light (walking / raking leaves)    How many days per week do you exercise? 3    How many minutes per day do you exercise? 15    Total minutes per week of exercise 45      Patient Education   Previous Diabetes Education Yes   1990   Disease Pathophysiology Definition of diabetes, type 1 and 2, and the diagnosis of diabetes    Healthy Eating Role of diet in the treatment of diabetes and the relationship between the three main macronutrients and blood glucose level;Plate Method;Meal options for control of blood glucose level and chronic complications.    Being Active Role of exercise on diabetes management, blood pressure control and cardiac health.    Medications Reviewed patients medication for diabetes, action, purpose, timing of dose and side effects.    Monitoring Identified appropriate SMBG and/or A1C goals.;Purpose and frequency of SMBG.    Acute complications Taught prevention, symptoms, and  treatment of hypoglycemia - the 15 rule.    Chronic complications Relationship between chronic complications and blood glucose control    Diabetes Stress and Support Identified and addressed patients feelings and concerns about diabetes;Worked with patient to identify barriers to care and solutions;Role of stress on diabetes      Individualized Goals (developed by patient)   Nutrition General guidelines for healthy choices and portions discussed    Physical Activity Exercise 5-7 days per week;30 minutes per day    Medications take my medication as prescribed    Monitoring  Test my blood glucose as discussed    Problem Solving Eating Pattern;Addressing barriers to behavior change     Reducing Risk examine blood glucose patterns;stop smoking;do foot checks daily;treat hypoglycemia with 15 grams of carbs if blood glucose less than 70mg /dL      Post-Education Assessment   Patient understands the diabetes disease and treatment process. Comprehends key points    Patient understands incorporating nutritional management into lifestyle. Needs Review    Patient undertands incorporating physical activity into lifestyle. Comprehends key points    Patient understands using medications safely. Comphrehends key points    Patient understands monitoring blood glucose, interpreting and using results Comprehends key points    Patient understands prevention, detection, and treatment of acute complications. Needs Review    Patient understands prevention, detection, and treatment of chronic complications. Comprehends key  points    Patient understands how to develop strategies to address psychosocial issues. Comprehends key points    Patient understands how to develop strategies to promote health/change behavior. Needs Review      Outcomes   Expected Outcomes Demonstrated interest in learning. Expect positive outcomes    Future DMSE PRN    Program Status Completed          Individualized Plan for Diabetes Self-Management Training:   Learning Objective:  Patient will have a greater understanding of diabetes self-management. Patient education plan is to attend individual and/or group sessions per assessed needs and concerns.   Plan:   Patient Instructions  Restart your Vitamin D  - 2000 units (50 mg) Start Rosuvastatin per MD note and get labs redone in about 6 weeks.  Check your blood glucose every morning before food and coffee and 2 hours after dinner. Aim to walk daily (walking video is fine).  Aim for 30 minutes.  Eat more vegetables Avoid or limit margarine, bacon, chicken skin, fatty meat.  Bake rather than fry.     Expected Outcomes:  Demonstrated interest in learning.  Expect positive outcomes  Education material provided: ADA - How to Thrive: A Guide for Your Journey with Diabetes, Meal plan card, Snack sheet, and Diabetes Resources  If problems or questions, patient to contact team via:  Phone  Future DSME appointment: PRN

## 2024-03-18 NOTE — Therapy (Signed)
 OUTPATIENT PHYSICAL THERAPY TREATMENT     Patient Name: Mark Miranda MRN: [id number] DOB:07/25/1971, 53 y.o., male Today's Date: 03/18/2024  END OF SESSION:  PT End of Session - 03/18/24 0954     Visit Number 24    Number of Visits 24    Date for PT Re-Evaluation 03/19/24    Authorization Type Worker's Comp    PT Start Time 0933    PT Stop Time 1015    PT Time Calculation (min) 42 min    Activity Tolerance Patient tolerated treatment well    Behavior During Therapy [facility] for tasks assessed/performed                          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   Hypercholesterolemia    Hypertension    Metabolic acidosis    Recurrent peritonitis (HCC)    Type 1 diabetes with renal manifestations, controlled (HCC)    Vitamin D deficiency    Past Surgical History:  Procedure Laterality Date   AV FISTULA PLACEMENT  01/10/2012   Procedure: ARTERIOVENOUS (AV) FISTULA CREATION;  Surgeon: Carlin FORBES Haddock, MD;  Location: [facility] OR;  Service: Vascular;  Laterality: Left;  Left Brachial Cephalic Arteriovenous Fistula   CAPD INSERTION  01/09/2012   Procedure: CONTINUOUS AMBULATORY PERITONEAL DIALYSIS  (CAPD) CATHETER INSERTION;  Surgeon: Elon CHRISTELLA Pacini, MD;  Location: [facility] OR;  Service: General;  Laterality: N/A;  Exteriorization PD Cath   CAPSULOTOMY Right 11/07/2023   Procedure: CAPSULOTOMY, ELBOW;  Surgeon: Genelle Standing, MD;  Location: [facility];  Service: Orthopedics;  Laterality: Right;  RIGHT ELBOW CAPSULAR RELEASE   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   received left kidney, 3 renal arteries, 1 renal vein, ureter and pancreas from  53 year old   HARDWARE REMOVAL Right 11/07/2023   Procedure: REMOVAL, HARDWARE;  Surgeon: Genelle Standing, MD;  Location: [facility];  Service: Orthopedics;  Laterality: Right;   INSERTION OF DIALYSIS CATHETER  01/13/2012   Procedure: INSERTION OF DIALYSIS CATHETER;  Surgeon: Krystal JULIANNA Doing, MD;  Location: [facility] OR;  Service: Vascular;  Laterality: Right;  Internal Jugular   LAPAROSCOPY  03/13/2012   Procedure: LAPAROSCOPY DIAGNOSTIC;  Surgeon: Standing KYM Schultze, MD;  Location: [facility] OR;  Service: General;  Laterality: N/A;   LIVER BIOPSY  04/02/2013   open   ORIF HUMERUS FRACTURE Right 03/07/2023   Procedure: OPEN REDUCTION INTERNAL FIXATION (ORIF) DISTAL HUMERUS FRACTURE;  Surgeon: Genelle Standing, MD;  Location: [facility] ORS;  Service: Orthopedics;  Laterality: Right;   PERITONEAL CATHETER INSERTION     PERITONEAL CATHETER REMOVAL     SMALL INTESTINE SURGERY  04/02/2013   TRICEPS TENDON REPAIR  03/07/2023   Procedure: TRICEPS TENDON REPAIR;  Surgeon: Genelle Standing, MD;  Location: [facility] ORS;  Service: Orthopedics;;   ULNAR NERVE TRANSPOSITION Right 03/07/2023   Procedure: ULNAR NERVE EXPLORATION;  Surgeon: Genelle Standing, MD;  Location: [facility] ORS;  Service: Orthopedics;  Laterality: Right;   Patient Active Problem List   Diagnosis Date Noted   Hyperlipidemia LDL goal <70 11/13/2023   Closed fracture of right distal humerus 03/07/2023   Closed  bicondylar fracture of distal end of right humerus 03/04/2023   Type 2 diabetes mellitus with diabetic chronic kidney disease (HCC) 07/22/2022   Depression, major, 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/2022   Erectile dysfunction 07/24/2017   Vitamin D  deficiency, unspecified 07/24/2017   Low back pain 02/25/2016   Chronic fatigue 01/25/2016   Diabetic peripheral neuropathy (HCC) 11/24/2015   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   Depression 11/02/2015   Tobacco abuse 11/02/2015   End stage renal disease (HCC)  12/29/2011   HTN (hypertension) 01/25/2011    PCP: [name], [name], MD  REFERRING PROVIDER: Genelle Standing, MD  REFERRING DIAG: [phone] (ICD-10-CM) - Other closed displaced fracture of distal end of right humerus, initial encounter  THERAPY DIAG:  Stiffness of right elbow, not elsewhere classified  Pain in right elbow  Muscle weakness (generalized)  Right shoulder pain, unspecified chronicity  Localized edema  Rationale for Evaluation and Treatment: Rehabilitation  ONSET DATE: 11/07/23 R distal humeral elbow hardware removal  SUBJECTIVE:   SUBJECTIVE STATEMENT: Pt reports increased discomfort in triceps today after doing some housework. He state no improvement in his R shoulder pain.     PERTINENT HISTORY: Per 11/16/23: Status post right distal humeral elbow hardware removal.  At this time he is progressing with range of motion.  I would like him to work with physical therapy to progress his flexion and extension.   PAIN:  Are you having pain? Yes: NPRS scale:  1/10 R elbow, 1/10 shoulder pain  Pain location: see above Pain description: sharp Aggravating factors:   Relieving factors:     PRECAUTIONS: None  RED FLAGS: None   WEIGHT BEARING RESTRICTIONS: No  FALLS:  Has patient fallen in last 6 months? 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  Pt wants full strength of his arm and perform act's without pain.  NEXT MD VISIT: 11/23/23 for suture removal  OBJECTIVE:  Note: Objective measures were completed at Evaluation unless otherwise noted.  DIAGNOSTIC FINDINGS: R elbow x-ray 10/29/23 IMPRESSION: 1. Status post ORIF of the distal humerus without evidence of hardware complication. 2.  Several calcific densities along the palmar aspect of the elbow joint may reflect loose bodies.   COGNITION: Overall cognitive status: Within functional limits for tasks assessed      MUSCLE LENGTH: Biceps: ROM below    UPPER EXTREMITY ROM:  Active ROM (measured in supine) Right eval Left eval Right 5/16 Right 5/27  Left 5/27 Right 5/30 Right 7/8 Left 7/8  Shoulder flexion 145*   148 with pain   143 with pain 149  Shoulder scaption       125   Shoulder abduction 85*   118 with pain 157  88 with pain 150  Shoulder adduction          Shoulder extension          Shoulder internal rotation 80         Shoulder external rotation 75         Elbow flexion 115 145 143 144   143   Elbow extension -20 3 -12/-10 AROM/PROM -15/-11 AROM/PROM  -10 AROM -10 AROM   Wrist flexion  Wrist extension          Wrist ulnar deviation          Wrist radial deviation          Wrist pronation      WFL bilat    Wrist supination      WFL bilat     (Blank rows = not tested, * = pain)  UPPER EXTREMITY MMT:  MMT Right eval Left eval Right 5/30 Comparison 5/30 Right 7/8 compcarison  Shoulder flexion 4       Shoulder extension        Shoulder abduction 4-       Shoulder adduction        Shoulder extension        Shoulder internal rotation 4-       Shoulder external rotation 3+       Middle trapezius        Lower trapezius        Elbow flexion (90 deg) 24.7, 23.6 38.3, 40.7 29.9, 29.5 R  73% of L 34 R 84% of L  Elbow extension (90 deg) 12.9, 9.2 21.9, 28.3 19.8 R 70% of L 20.5 with pain R 72% of L  Wrist flexion        Wrist extension        Wrist ulnar deviation        Wrist radial deviation        Wrist pronation   5/5     Wrist supination   5/5 with shoulder pain     Grip strength 35 lbs 55 lbs 49 lbs      (Blank rows = not tested)                                                                                                                                TREATMENT  DATE:   8/11 UBE x 4 mins L2 (fwd/bwd 2 min each)  Pt received R shoulder PROM in flexion, scaption, and ER in supine per pt and tissue tolerance. Pt received R elbow flex and extension  Supine rhythmic stab's at 90deg 2x30 sec each Supine active shoulder flexion 0# 2x10 Prone triceps extension 3# 2x10 Prone shoulder extension 2# 3x10 Prone row 6# 2x15 Bicep curl 7# 2x15 Theraband row Black TB x30 Standing triceps extension with GTB 3x10    8/7 UBE x 4 mins L2 (fwd/bwd 2 min each)  Pt received R shoulder PROM in flexion, scaption, and ER in supine per pt and tissue tolerance. Pt received R elbow flex and extension  Supine rhythmic stab's at 90deg 2x30 sec each Supine active shoulder flexion 0# 2x10 Prone triceps extension 3# 2x10 Prone shoulder extension 2# 3x10 Prone row 6# 2x10 Bicep curl 8# 2x15 Theraband row Black TB x30 Standing triceps extension with GTB 3x10  4D ball rolls on wall 2x10 each  8/5 UBE x 4 mins L2 (fwd/bwd 2 min each)  Pt received R shoulder PROM in flexion, scaption, and ER in supine per pt and tissue tolerance. Pt received R elbow flex and extension and forearm pronation and supination in supine per pt and tissue tolerance.   Supine rhythmic stab's at 90/60/120 deg 2x30 sec each S/L ER x15, 1# 2x12 Prone shoulder extension 3# 3x10 Prone horizontal abd 2x10 Theraband row Black TB x30 Standing triceps extension with GTB 3x10  (L UE holding other end of the GTB) 4D ball rolls on wall x10 each       PATIENT EDUCATION:  Education details: Exercise form, POC, HEP.   Person educated: Patient Education method: Explanation, Demonstration, verbal and tactile cues Education comprehension: verbalized understanding, returned demonstration, and needs further education, verbal and tactile cues required  HOME EXERCISE PROGRAM: Access Code: [email or web address] URL: [email or web address] Date: 11/22/2023 Prepared by: Gellen April Earnie Starring   ASSESSMENT:  CLINICAL IMPRESSION:  Good tolerance overall for strengthneing. He did report some triceps discomfort with prone elbow extension, but he was able to tolerate all reps. Continued to work on [employer]. Plan to d/c pt for distal humerus next visit. MD has requested work on GHJ at future sessions. Updated final HEP for triceps and forearm strengthening.    OBJECTIVE IMPAIRMENTS: decreased coordination, decreased mobility, decreased ROM, decreased strength, hypomobility, increased edema, increased fascial restrictions, increased muscle spasms, impaired flexibility, impaired UE functional use, improper body mechanics, postural dysfunction, and pain.   ACTIVITY LIMITATIONS: carrying, lifting, bathing, toileting, dressing, reach over head, and hygiene/grooming  PARTICIPATION LIMITATIONS: meal prep, cleaning, laundry, driving, shopping, community activity, and occupation  PERSONAL FACTORS: Age, Fitness, Past/current experiences, and Time since onset of injury/illness/exacerbation are also affecting patient's functional outcome.   REHAB POTENTIAL: Good  CLINICAL DECISION MAKING: Evolving/moderate complexity  EVALUATION COMPLEXITY: Moderate   GOALS: Goals reviewed with patient? Yes  SHORT TERM GOALS: Target date: 12/13/2023  Pt will be ind with initial HEP Baseline: Goal status: ONGOING  2.  Pt will have improved elbow ROM to 10-135 deg Baseline:  Goal status:  GOAL MET  5/30  3.  Pt will have improved shoulder flexion and abd by at least 10 deg Baseline:  Goal status:  NOT MET     LONG TERM GOALS: Target date: 03/19/2024   Pt will be ind with management and progression of HEP Baseline:  Goal status: PROGRESSING  2.  Pt will have improved elbow ROM to at least 5-140 deg Baseline:  Goal status: 80%  MET  5/30  3.  Pt will have full and pain free shoulder elevation for overhead tasks Baseline:  Goal status: NOT MET  4.  Pt will be able to  tolerate lifting and carrying at least 50# for work tasks Baseline:  Goal status: INITIAL  5.  Pt will have R UE strength to at least 90% of L UE strength for work tasks Baseline:  Goal status: PROGRESSING  7/8  6.  Pt will demo MCID on his QuickDASH or UEFI Baseline:  Goal status: NOT MET   PLAN:  PT FREQUENCY: 1-2x/wk  PT DURATION: 4-5 weeks  PLANNED INTERVENTIONS: 02835- PT Re-evaluation, 97110-Therapeutic exercises, 97530- Therapeutic activity, [id number]- Neuromuscular re-education, 97535- Self Care, 02859- Manual therapy, [id number]- Electrical stimulation (unattended), 97033- Ionotophoresis 4mg /ml Dexamethasone, Patient/Family education, Taping, Dry Needling, Joint mobilization, Cryotherapy, and Moist heat  PLAN FOR NEXT SESSION:  Pt sees MD later this week.  Possible discharge next visit.  Review HEP and update as needed.   Asberry Rodes, PTA  03/18/24 11:15 AM

## 2024-03-18 NOTE — Patient Instructions (Addendum)
 Restart your Vitamin D  - 2000 units (50 mg) Start Rosuvastatin per MD note and get labs redone in about 6 weeks.  Check your blood glucose every morning before food and coffee and 2 hours after dinner. Aim to walk daily (walking video is fine).  Aim for 30 minutes.  Eat more vegetables Avoid or limit margarine, bacon, chicken skin, fatty meat.  Bake rather than fry.

## 2024-03-19 LAB — CYCLIC CITRUL PEPTIDE ANTIBODY, IGG: Cyclic Citrullin Peptide Ab: <16 U

## 2024-03-19 LAB — GLUTAMIC ACID DECARBOXYLASE AUTO ABS: Glutamic Acid Decarb Ab: >250 [IU]/mL — ABNORMAL HIGH (ref <5)

## 2024-03-26 ENCOUNTER — Other Ambulatory Visit: Payer: Self-pay | Admitting: Family Medicine

## 2024-03-26 MED ORDER — BASAGLAR KWIKPEN 100 UNIT/ML ~~LOC~~ SOPN
20.0000 [IU] | PEN_INJECTOR | Freq: Every day | SUBCUTANEOUS | 0 refills | Status: DC
Start: 2024-03-26 — End: 2024-05-17

## 2024-04-10 ENCOUNTER — Encounter (HOSPITAL_BASED_OUTPATIENT_CLINIC_OR_DEPARTMENT_OTHER)

## 2024-04-11 ENCOUNTER — Ambulatory Visit (HOSPITAL_BASED_OUTPATIENT_CLINIC_OR_DEPARTMENT_OTHER): Payer: Worker's Compensation | Attending: Orthopaedic Surgery | Admitting: Physical Therapy

## 2024-04-11 DIAGNOSIS — M6281 Muscle weakness (generalized): Secondary | ICD-10-CM | POA: Insufficient documentation

## 2024-04-11 DIAGNOSIS — M25621 Stiffness of right elbow, not elsewhere classified: Secondary | ICD-10-CM | POA: Diagnosis present

## 2024-04-11 DIAGNOSIS — M25521 Pain in right elbow: Secondary | ICD-10-CM | POA: Diagnosis present

## 2024-04-11 NOTE — Therapy (Unsigned)
 OUTPATIENT PHYSICAL THERAPY TREATMENT     Patient Name: Mark Miranda MRN: [id number] DOB:[birth year], 53 y.o., male Today's Date: 04/12/2024  END OF SESSION:  PT End of Session - 04/12/24 1329     Visit Number 25    Number of Visits 25    Authorization Type Worker's Comp    PT Start Time 1600    PT Stop Time 1630    PT Time Calculation (min) 30 min    Activity Tolerance Patient tolerated treatment well    Behavior During Therapy WFL for tasks assessed/performed                           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   Hypercholesterolemia    Hypertension    Metabolic acidosis    Recurrent peritonitis (HCC)    Type 1 diabetes with renal manifestations, controlled (HCC)    Vitamin D  deficiency    Past Surgical History:  Procedure Laterality Date   AV FISTULA PLACEMENT  01/10/2012   Procedure: ARTERIOVENOUS (AV) FISTULA CREATION;  Surgeon: Carlin FORBES Haddock, MD;  Location: [facility] OR;  Service: Vascular;  Laterality: Left;  Left Brachial Cephalic Arteriovenous Fistula   CAPD INSERTION  01/09/2012   Procedure: CONTINUOUS AMBULATORY PERITONEAL DIALYSIS  (CAPD) CATHETER INSERTION;  Surgeon: Elon CHRISTELLA Pacini, MD;  Location: [facility] OR;  Service: General;  Laterality: N/A;  Exteriorization PD Cath   CAPSULOTOMY Right 11/07/2023   Procedure: CAPSULOTOMY, ELBOW;  Surgeon: Genelle Standing, MD;  Location: [facility];  Service: Orthopedics;  Laterality: Right;  RIGHT ELBOW CAPSULAR RELEASE   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   received left kidney, 3 renal arteries, 1 renal vein, ureter and pancreas from 53 year old   HARDWARE REMOVAL Right  11/07/2023   Procedure: REMOVAL, HARDWARE;  Surgeon: Genelle Standing, MD;  Location: [facility];  Service: Orthopedics;  Laterality: Right;   INSERTION OF DIALYSIS CATHETER  01/13/2012   Procedure: INSERTION OF DIALYSIS CATHETER;  Surgeon: Krystal JULIANNA Doing, MD;  Location: [facility] OR;  Service: Vascular;  Laterality: Right;  Internal Jugular   LAPAROSCOPY  03/13/2012   Procedure: LAPAROSCOPY DIAGNOSTIC;  Surgeon: Standing KYM Schultze, MD;  Location: [facility] OR;  Service: General;  Laterality: N/A;   LIVER BIOPSY  04/02/2013   open   ORIF HUMERUS FRACTURE Right 03/07/2023   Procedure: OPEN REDUCTION INTERNAL FIXATION (ORIF) DISTAL HUMERUS FRACTURE;  Surgeon: Genelle Standing, MD;  Location: [facility] ORS;  Service: Orthopedics;  Laterality: Right;   PERITONEAL CATHETER INSERTION     PERITONEAL CATHETER REMOVAL     SMALL INTESTINE SURGERY  04/02/2013   TRICEPS TENDON REPAIR  03/07/2023   Procedure: TRICEPS TENDON REPAIR;  Surgeon: Genelle Standing, MD;  Location: [facility] ORS;  Service: Orthopedics;;   ULNAR NERVE TRANSPOSITION Right 03/07/2023   Procedure: ULNAR NERVE EXPLORATION;  Surgeon: Genelle Standing, MD;  Location: [facility] ORS;  Service: Orthopedics;  Laterality: Right;   Patient Active Problem List   Diagnosis Date Noted   Hyperlipidemia LDL goal <70 11/13/2023   Closed fracture of right distal humerus 03/07/2023   Closed bicondylar fracture of distal end of right  humerus 03/04/2023   Type 2 diabetes mellitus with diabetic chronic kidney disease (HCC) 07/22/2022   Depression, major, 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/2022   Erectile dysfunction 07/24/2017   Vitamin D  deficiency, unspecified 07/24/2017   Low back pain 02/25/2016   Chronic fatigue 01/25/2016   Diabetic peripheral neuropathy (HCC) 11/24/2015   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   Depression 11/02/2015   Tobacco abuse 11/02/2015   End stage renal disease (HCC) 12/29/2011   HTN (hypertension) 01/25/2011     PCP: [name], [name], MD  REFERRING PROVIDER: Genelle Standing, MD  REFERRING DIAG: [phone] (ICD-10-CM) - Other closed displaced fracture of distal end of right humerus, initial encounter  THERAPY DIAG:  Stiffness of right elbow, not elsewhere classified  Pain in right elbow  Muscle weakness (generalized)  Rationale for Evaluation and Treatment: Rehabilitation  ONSET DATE: 11/07/23 R distal humeral elbow hardware removal  SUBJECTIVE:   SUBJECTIVE STATEMENT: Pt still has pain with extending elbow.  Pt states he is not performing lifting activities.  He reports a little improvement in performing household chores though still has pain and difficulty with household chores.  Pt has a HEP though has not performed his HEP due to being in [place].  Pt states he is able to lift his arm higher now.  He reports the elbow has been a little worse stating no improvement in his R shoulder pain.   Pt states he had some soreness after prior treatment.     PERTINENT HISTORY: Per 11/16/23: Status post right distal humeral elbow hardware removal.  At this time he is progressing with range of motion.  I would like him to work with physical therapy to progress his flexion and extension.   PAIN:  Are you having pain? Yes: NPRS scale:  1/10 current, 2/10 worst   Pain location: R elbow Pain description: sharp Aggravating factors:   Relieving factors:     PRECAUTIONS: None  RED FLAGS: None   WEIGHT BEARING RESTRICTIONS: No  FALLS:  Has patient fallen in last 6 months? 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  Pt wants full strength of his arm and perform act's without pain.  NEXT MD VISIT: 11/23/23 for suture  removal  OBJECTIVE:  Note: Objective measures were completed at Evaluation unless otherwise noted.  DIAGNOSTIC FINDINGS: R elbow x-ray 10/29/23 IMPRESSION: 1. Status post ORIF of the distal humerus without evidence of hardware complication. 2. Several calcific densities along the palmar aspect of the elbow joint may reflect loose bodies.   COGNITION: Overall cognitive status: Within functional limits for tasks assessed       UPPER EXTREMITY ROM:  Active ROM (measured in supine) Right eval Left eval Right 5/16 Right 5/27  Left 5/27 Right 5/30 Right 7/8 Left 7/8  Shoulder flexion 145*   148 with pain   143 with pain 149  Shoulder scaption       125   Shoulder abduction 85*   118 with pain 157  88 with pain 150  Shoulder adduction          Shoulder extension          Shoulder internal rotation 80  Shoulder external rotation 75         Elbow flexion 115 145 143 144   143   Elbow extension -20 3 -12/-10 AROM/PROM -15/-11 AROM/PROM  -10 AROM -10 AROM   Wrist flexion          Wrist extension          Wrist ulnar deviation          Wrist radial deviation          Wrist pronation      WFL bilat    Wrist supination      WFL bilat     (Blank rows = not tested, * = pain)  UPPER EXTREMITY MMT:  MMT Right eval Left eval Right 5/30 Comparison 5/30 Right 7/8 comparison  Shoulder flexion 4       Shoulder extension        Shoulder abduction 4-       Shoulder adduction        Shoulder extension        Shoulder internal rotation 4-       Shoulder external rotation 3+       Middle trapezius        Lower trapezius        Elbow flexion (90 deg) 24.7, 23.6 38.3, 40.7 29.9, 29.5 R  73% of L 34 R 84% of L  Elbow extension (90 deg) 12.9, 9.2 21.9, 28.3 19.8 R 70% of L 20.5 with pain R 72% of L  Wrist flexion        Wrist extension        Wrist ulnar deviation        Wrist radial deviation        Wrist pronation   5/5     Wrist supination   5/5 with shoulder pain      Grip strength 35 lbs 55 lbs 49 lbs      (Blank rows = not tested)                                                                                                                                TREATMENT DATE:   9/4  Reviewed current function, pain levels, and response to prior Rx.  UEFI:  50/80  UBE x 4 mins L2 (fwd/bwd 2 min each)  PT reviewed HEP and educated pt in appropriate frequency and correct form.  Prone triceps extension 3#  Prone row 6# 2x15 Bicep curl 7# 2x10 Standing triceps extension with GTB 3x10     PATIENT EDUCATION:  Education details: Exercise form, discharge planning, POC, HEP.   Person educated: Patient Education method: Explanation, Demonstration, verbal cues Education comprehension: verbalized understanding, returned demonstration   HOME EXERCISE PROGRAM: Access Code: [email or web address] URL: [email or web address] Date: 11/22/2023 Prepared by: Gellen April Earnie Starring   ASSESSMENT:  CLINICAL IMPRESSION:  Pt continues to have pain with elbow extension and reports no change in pain.  Pt's worst pain in elbow is 2/10.  He has plateaued in progress.  Pt continues to have shoulder pain though states he cane lift UE higher now.  PT extensively reviewed HEP and educated pt in correct exercises, correct form, and appropriate frequency.  He performed exercises well without c/o's.  He is independent with HEP.  PT educated pt in POC and answered pt's questions.  Pt demonstrates improved self perceived disability with UEFI improving from 42/80 prior to 50/80 currently.  Pt has met STG's #1,2 and LTG #1 and partially met LTG's #2,6.  Pt will be discharged due to plateauing in functional progress.     OBJECTIVE IMPAIRMENTS: decreased coordination, decreased mobility, decreased ROM, decreased strength, hypomobility, increased edema, increased fascial restrictions, increased muscle spasms, impaired flexibility, impaired UE functional use, improper body  mechanics, postural dysfunction, and pain.   ACTIVITY LIMITATIONS: carrying, lifting, bathing, toileting, dressing, reach over head, and hygiene/grooming  PARTICIPATION LIMITATIONS: meal prep, cleaning, laundry, driving, shopping, community activity, and occupation  PERSONAL FACTORS: Age, Fitness, Past/current experiences, and Time since onset of injury/illness/exacerbation are also affecting patient's functional outcome.   REHAB POTENTIAL: Good  CLINICAL DECISION MAKING: Evolving/moderate complexity  EVALUATION COMPLEXITY: Moderate   GOALS: Goals reviewed with patient? Yes  SHORT TERM GOALS: Target date: 12/13/2023  Pt will be ind with initial HEP Baseline: Goal status:  GOAL MET  2.  Pt will have improved elbow ROM to 10-135 deg Baseline:  Goal status:  GOAL MET  5/30  3.  Pt will have improved shoulder flexion and abd by at least 10 deg Baseline:  Goal status:  NOT MET    LONG TERM GOALS: Target date: 03/19/2024   Pt will be ind with management and progression of HEP Baseline:  Goal status: GOAL MET  9/4  2.  Pt will have improved elbow ROM to at least 5-140 deg Baseline:  Goal status: 80%  MET  5/30  3.  Pt will have full and pain free shoulder elevation for overhead tasks Baseline:  Goal status: NOT MET  4.  Pt will be able to tolerate lifting and carrying at least 50# for work tasks Baseline:  Goal status: NOT MET  5.  Pt will have R UE strength to at least 90% of L UE strength for work tasks Baseline:  Goal status: PROGRESSING  7/8  6.  Pt will demo MCID on his QuickDASH or UEFI Baseline:  Goal status: 78% MET   PLAN:  PT FREQUENCY: 1-2x/wk  PT DURATION: 4-5 weeks  PLANNED INTERVENTIONS: 97164- PT Re-evaluation, 97110-Therapeutic exercises, 97530- Therapeutic activity, [id number]- Neuromuscular re-education, 97535- Self Care, [id number]- Manual therapy, [id number]- Electrical stimulation (unattended), 97033- Ionotophoresis 4mg /ml Dexamethasone , Patient/Family  education, Taping, Dry Needling, Joint mobilization, Cryotherapy, and Moist heat  PLAN FOR NEXT SESSION:  Pt to be discharged from skilled PT due to a plateau in progress.  He is independent with HEP and will continue with HEP.  Pt is agreeable with discharge.  Pt continues to have shoulder pain and limited mobility in shoulder.  MD has ordered PT for adhesive capsulitis.  PT to discharge elbow script and evaluate shoulder next visit.  PHYSICAL THERAPY DISCHARGE SUMMARY  Visits from Start of Care: 25  Current functional level related to goals / functional outcomes: See above   Remaining deficits: See above   Education / Equipment: See above    Leigh Minerva III PT, DPT 04/12/24 9:21 PM

## 2024-04-12 ENCOUNTER — Encounter (HOSPITAL_BASED_OUTPATIENT_CLINIC_OR_DEPARTMENT_OTHER): Payer: Self-pay | Admitting: Physical Therapy

## 2024-04-15 ENCOUNTER — Encounter (HOSPITAL_BASED_OUTPATIENT_CLINIC_OR_DEPARTMENT_OTHER)

## 2024-04-16 ENCOUNTER — Ambulatory Visit: Admitting: Family Medicine

## 2024-04-18 ENCOUNTER — Ambulatory Visit (HOSPITAL_BASED_OUTPATIENT_CLINIC_OR_DEPARTMENT_OTHER): Payer: Worker's Compensation

## 2024-04-22 ENCOUNTER — Ambulatory Visit (HOSPITAL_BASED_OUTPATIENT_CLINIC_OR_DEPARTMENT_OTHER): Payer: Worker's Compensation | Admitting: Physical Therapy

## 2024-04-25 ENCOUNTER — Encounter (HOSPITAL_BASED_OUTPATIENT_CLINIC_OR_DEPARTMENT_OTHER)

## 2024-04-29 ENCOUNTER — Encounter (HOSPITAL_BASED_OUTPATIENT_CLINIC_OR_DEPARTMENT_OTHER): Admitting: Physical Therapy

## 2024-04-29 MED ORDER — ROSUVASTATIN CALCIUM 10 MG PO TABS: 10.0000 mg | ORAL_TABLET | Freq: Every day | ORAL | 3 refills | Status: DC

## 2024-05-02 ENCOUNTER — Encounter (HOSPITAL_BASED_OUTPATIENT_CLINIC_OR_DEPARTMENT_OTHER): Admitting: Physical Therapy

## 2024-05-03 ENCOUNTER — Other Ambulatory Visit: Payer: Self-pay | Admitting: Family Medicine

## 2024-05-03 DIAGNOSIS — I10 Essential (primary) hypertension: Secondary | ICD-10-CM

## 2024-05-06 ENCOUNTER — Encounter (HOSPITAL_BASED_OUTPATIENT_CLINIC_OR_DEPARTMENT_OTHER): Admitting: Physical Therapy

## 2024-05-08 ENCOUNTER — Encounter (HOSPITAL_BASED_OUTPATIENT_CLINIC_OR_DEPARTMENT_OTHER)

## 2024-05-13 ENCOUNTER — Encounter (HOSPITAL_BASED_OUTPATIENT_CLINIC_OR_DEPARTMENT_OTHER): Admitting: Physical Therapy

## 2024-05-14 ENCOUNTER — Ambulatory Visit: Admitting: Family Medicine

## 2024-05-14 ENCOUNTER — Encounter: Payer: Self-pay | Admitting: Family Medicine

## 2024-05-14 ENCOUNTER — Telehealth: Payer: Self-pay | Admitting: *Deleted

## 2024-05-14 ENCOUNTER — Ambulatory Visit (INDEPENDENT_AMBULATORY_CARE_PROVIDER_SITE_OTHER): Admitting: Family Medicine

## 2024-05-14 VITALS — BP 130/70 | HR 86 | Temp 97.9°F | Resp 16 | Ht 68.0 in | Wt 154.6 lb

## 2024-05-14 DIAGNOSIS — I10 Essential (primary) hypertension: Secondary | ICD-10-CM

## 2024-05-14 DIAGNOSIS — E559 Vitamin D deficiency, unspecified: Secondary | ICD-10-CM | POA: Diagnosis not present

## 2024-05-14 DIAGNOSIS — M79605 Pain in left leg: Secondary | ICD-10-CM

## 2024-05-14 DIAGNOSIS — M79604 Pain in right leg: Secondary | ICD-10-CM

## 2024-05-14 DIAGNOSIS — E1122 Type 2 diabetes mellitus with diabetic chronic kidney disease: Secondary | ICD-10-CM

## 2024-05-14 DIAGNOSIS — E1022 Type 1 diabetes mellitus with diabetic chronic kidney disease: Secondary | ICD-10-CM

## 2024-05-14 LAB — GLUCOSE, POCT (MANUAL RESULT ENTRY): POC Glucose: 197 mg/dL — AB (ref 70–99)

## 2024-05-14 MED ORDER — FREESTYLE LIBRE 3 PLUS SENSOR MISC: 1 refills | Status: AC

## 2024-05-14 MED ORDER — INSULIN LISPRO (1 UNIT DIAL) 100 UNIT/ML (KWIKPEN): PEN_INJECTOR | SUBCUTANEOUS | 11 refills | Status: DC

## 2024-05-14 MED ORDER — FREESTYLE LIBRE READER DEVI
1.0000 | 3 refills | Status: AC
Start: 2024-05-14 — End: ?

## 2024-05-14 NOTE — Patient Instructions (Addendum)
 A few things to remember from today's visit:  Pain in both lower extremities - Plan: VAS US  ABI WITH/WO TBI  Hypertension, unspecified type  Humalog lo empezamos hoy. Sliding scale 3 veces al dia y dependiendo del azucar antes de comer. 140-180 6 U  181-220 8 U  221-260 10 U  261-300 12 U  301-350 14 U  350-400 16 U No cambios en basaglar .

## 2024-05-14 NOTE — Telephone Encounter (Signed)
 Copied from CRM #[id number]. Topic: Clinical - Medication Prior Auth >> May 14, 2024 12:25 PM Franky GRADE wrote: Reason for CRM: Patient is calling because the pharmacy informed him that the Continuous Glucose Sensor (FREESTYLE LIBRE 3 PLUS SENSOR) MISC [[id number]] requires a prior auth.

## 2024-05-14 NOTE — Progress Notes (Unsigned)
 HPI:  Chief Complaint  Patient presents with   Medical Management of Chronic Issues    Mark Miranda is a 53 y.o. male, who is here today for chronic disease management. Last seen on *** Discussed the use of AI scribe software for clinical note transcription with the patient, who gave verbal consent to proceed.  History of Present Illness Mark Miranda is a 53 year old male with type 1 diabetes who presents for follow-up regarding his diabetes management.  He has a history of type 1 diabetes and underwent a pancreas transplant. He is currently on insulin  therapy, having switched from oral medications. He checks his blood sugar two to three times daily, with fasting levels around 100 mg/dL and no episodes below 80 mg/dL. Postprandial levels can reach up to 300 mg/dL, particularly after consuming carbohydrates like bread or pancakes, but his morning levels are typically normal. He uses Basaglar  insulin  once daily in the morning.  He has made lifestyle changes, including increased physical activity and dietary modifications, such as reducing intake of refined carbohydrates and increasing consumption of spinach and avocado. Despite these efforts, he experiences fluctuations in blood sugar levels, with some postprandial readings reaching 250 mg/dL.  He experiences muscle pain in his calves during exercise, which resolves with rest. This pain occurs after walking one to two blocks and subsides within a minute of stopping. He has been experiencing this for a long time and associates it with his exercise routine.  He is currently taking rosuvastatin  for high cholesterol and lisinopril  10 mg daily. He has a history of low vitamin D  levels. He last saw his transplant doctor in early September and is monitored for vitamin D  levels by his nephrologist.  Family history is notable for his mother having diabetes and using insulin .   Hypertension:  Medications:*** BP readings at home:*** Side  effects:***  Negative for unusual or severe headache, visual changes, exertional chest pain, dyspnea,  focal weakness, or edema.  Lab Results  Component Value Date   CREATININE 1.16 11/07/2023   BUN 22 (H) 11/07/2023   NA 136 11/07/2023   K 4.9 11/07/2023   CL 105 11/07/2023   CO2 18 (L) 11/07/2023    Diabetes Mellitus II:  - Checking BG at home: *** - Medications: *** - Compliance: *** - Diet: *** - Exercise: *** - eye exam: *** - foot exam: ***  - Negative for symptoms of hypoglycemia, polyuria, polydipsia, numbness extremities, foot ulcers/trauma  Lab Results  Component Value Date   HGBA1C 8.6 (A) 03/13/2024   Lab Results  Component Value Date   MICROALBUR 207.00 (H) 04/15/2008    Hyperlipidemia: Currently on *** Following a low fat diet: ***. Side effects from medication:*** Lab Results  Component Value Date   CHOL 222 (H) 03/13/2024   HDL 48.60 03/13/2024   LDLCALC 151 (H) 03/13/2024   LDLDIRECT 142.0 07/17/2012   TRIG 110.0 03/13/2024   CHOLHDL 5 03/13/2024    Review of Systems See other pertinent positives and negatives in HPI.  Current Outpatient Medications on File Prior to Visit  Medication Sig Dispense Refill   amLODipine  (NORVASC ) 5 MG tablet TAKE 1 TABLET BY MOUTH EVERYDAY AT BEDTIME 90 tablet 1   Blood Glucose Monitoring Suppl (ACCU-CHEK AVIVA PLUS) w/Device KIT As directed. 1 kit 0   Continuous Glucose Receiver (DEXCOM G7 RECEIVER) DEVI 1 Device by Does not apply route daily. (Patient not taking: Reported on 03/18/2024) 1 each 1   Continuous Glucose Sensor (DEXCOM G7 SENSOR)  MISC 1 Device by Does not apply route daily. (Patient not taking: Reported on 03/18/2024) 12 each 1   Insulin  Glargine (BASAGLAR  KWIKPEN) 100 UNIT/ML Inject 20 Units into the skin at bedtime. 9 mL 0   lisinopril  (ZESTRIL ) 10 MG tablet Take 1 tablet (10 mg total) by mouth daily. 90 tablet 2   mycophenolate (MYFORTIC) 180 MG EC tablet Take 4 tablets by mouth 2 (two) times  daily.     oxyCODONE  (ROXICODONE ) 5 MG immediate release tablet Take 1 tablet (5 mg total) by mouth every 4 (four) hours as needed for severe pain (pain score 7-10) or breakthrough pain. (Patient not taking: Reported on 03/18/2024) 20 tablet 0   rosuvastatin  (CRESTOR ) 10 MG tablet Take 1 tablet (10 mg total) by mouth daily. 90 tablet 3   tacrolimus  (PROGRAF ) 1 MG capsule Take 5 mg by mouth 2 (two) times daily. Pt take 5 mg in the morning and 4 mg at night     varenicline  (CHANTIX ) 0.5 MG tablet Take 1 tablet (0.5 mg total) by mouth 2 (two) times daily. (Patient not taking: Reported on 03/18/2024) 60 tablet 0   [DISCONTINUED] diltiazem  (CARDIZEM ) 30 MG tablet Take 30 mg by mouth 4 (four) times daily. Ask patient to verify dosage and frequency. Not indicated on medical history form dated 10/23/10.      [DISCONTINUED] insulin  lispro (HUMALOG) 100 UNIT/ML injection Inject into the skin 3 (three) times daily before meals. Ask patient to verify name/dosage/. History form noted dosage of 34 units during day and 22 units at night.      No current facility-administered medications on file prior to visit.    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   Hypercholesterolemia    Hypertension    Metabolic acidosis    Recurrent peritonitis (HCC)    Type 1 diabetes with renal manifestations, controlled (HCC)    Vitamin D  deficiency     No Known Allergies  Social History   Socioeconomic History   Marital status: Married    Spouse name: [name]   Number of children: 4   Years of education: 10   Highest education level: Not on file  Occupational History   Occupation: Education administrator  Tobacco Use   Smoking status: Every  Day    Current packs/day: 1.00    Average packs/day: 1 pack/day for 22.0 years (22.0 ttl pk-yrs)    Types: Cigarettes   Smokeless tobacco: Never   Tobacco comments:    Down to 1/2 ppd with chantix   Vaping Use   Vaping status: Former   Substances: Nicotine   Substance and Sexual Activity   Alcohol  use: Yes    Alcohol /week: 0.0 standard drinks of alcohol     Comment: socially   Drug use: No   Sexual activity: Yes  Other Topics Concern   Not on file  Social History Narrative   Paints houses   Wife works at a Retail banker at [employer]   4 children age 22 to 10   Regular exercise-no current exercise regimen   Caffeine Use-no   Social Drivers of Corporate investment banker Strain: Not on file  Food Insecurity: Not on file  Transportation Needs: Not on  file  Physical Activity: Not on file  Stress: Not on file  Social Connections: Not on file    There were no vitals filed for this visit.  There is no height or weight on file to calculate BMI.  Physical Exam  ASSESSMENT AND PLAN:  There are no diagnoses linked to this encounter.  No orders of the defined types were placed in this encounter.   No problem-specific Assessment & Plan notes found for this encounter.   No follow-ups on file.   Cheyane Ayon [name], MD [facility]. [place] office.

## 2024-05-15 ENCOUNTER — Encounter (HOSPITAL_BASED_OUTPATIENT_CLINIC_OR_DEPARTMENT_OTHER): Payer: Self-pay | Admitting: Orthopaedic Surgery

## 2024-05-15 ENCOUNTER — Ambulatory Visit (INDEPENDENT_AMBULATORY_CARE_PROVIDER_SITE_OTHER): Payer: Worker's Compensation | Admitting: Orthopaedic Surgery

## 2024-05-15 ENCOUNTER — Other Ambulatory Visit (HOSPITAL_BASED_OUTPATIENT_CLINIC_OR_DEPARTMENT_OTHER): Payer: Self-pay | Admitting: Orthopaedic Surgery

## 2024-05-15 ENCOUNTER — Encounter (HOSPITAL_BASED_OUTPATIENT_CLINIC_OR_DEPARTMENT_OTHER)

## 2024-05-15 DIAGNOSIS — S42491A Other displaced fracture of lower end of right humerus, initial encounter for closed fracture: Secondary | ICD-10-CM

## 2024-05-15 DIAGNOSIS — M7501 Adhesive capsulitis of right shoulder: Secondary | ICD-10-CM

## 2024-05-15 NOTE — Progress Notes (Signed)
 Post Operative Evaluation    Procedure/Date of Surgery: Right distal humerus removal of hardware with ulnar neurolysis 4/1  Interval History:   Presents today for follow-up of the right elbow.  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   Hypercholesterolemia    Hypertension    Metabolic acidosis    Recurrent peritonitis (HCC)    Type 1 diabetes with renal manifestations, controlled (HCC)    Vitamin D  deficiency    Past Surgical History:  Procedure Laterality Date   AV FISTULA PLACEMENT  01/10/2012   Procedure: ARTERIOVENOUS (AV) FISTULA CREATION;  Surgeon: Carlin FORBES Haddock, MD;  Location: [facility] OR;  Service: Vascular;  Laterality: Left;  Left Brachial Cephalic Arteriovenous Fistula   CAPD INSERTION  01/09/2012   Procedure: CONTINUOUS AMBULATORY PERITONEAL DIALYSIS  (CAPD) CATHETER INSERTION;  Surgeon: Elon CHRISTELLA Pacini, MD;  Location: [facility] OR;  Service: General;  Laterality: N/A;  Exteriorization PD Cath   CAPSULOTOMY Right 11/07/2023   Procedure: CAPSULOTOMY, ELBOW;  Surgeon: Genelle Standing, MD;  Location: [facility];  Service: Orthopedics;  Laterality: Right;  RIGHT ELBOW CAPSULAR RELEASE   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   received left kidney, 3 renal arteries, 1 renal vein, ureter and pancreas from 53 year old   HARDWARE REMOVAL Right 11/07/2023   Procedure: REMOVAL, HARDWARE;  Surgeon: Genelle Standing, MD;  Location: [facility];  Service: Orthopedics;  Laterality: Right;   INSERTION OF DIALYSIS CATHETER  01/13/2012   Procedure: INSERTION OF DIALYSIS CATHETER;  Surgeon: Krystal JULIANNA Doing, MD;  Location: [facility] OR;  Service: Vascular;  Laterality: Right;  Internal Jugular   LAPAROSCOPY  03/13/2012   Procedure: LAPAROSCOPY DIAGNOSTIC;  Surgeon: Standing KYM Schultze, MD;  Location: [facility] OR;  Service: General;  Laterality: N/A;   LIVER BIOPSY  04/02/2013   open   ORIF HUMERUS FRACTURE Right 03/07/2023   Procedure: OPEN REDUCTION INTERNAL FIXATION (ORIF) DISTAL HUMERUS FRACTURE;  Surgeon: Genelle Standing, MD;  Location: [facility] ORS;  Service: Orthopedics;  Laterality: Right;   PERITONEAL CATHETER INSERTION     PERITONEAL CATHETER REMOVAL     SMALL INTESTINE SURGERY  04/02/2013   TRICEPS TENDON REPAIR  03/07/2023   Procedure: TRICEPS TENDON REPAIR;  Surgeon: Genelle Standing, MD;  Location: [facility] ORS;  Service: Orthopedics;;   ULNAR NERVE TRANSPOSITION Right 03/07/2023   Procedure: ULNAR NERVE EXPLORATION;  Surgeon: Genelle Standing, MD;  Location: [facility] ORS;  Service: Orthopedics;  Laterality: Right;   Social History   Socioeconomic History   Marital status: Married    Spouse name: [name]   Number of children: 4   Years of education: 10   Highest education level: Not on file  Occupational History   Occupation: Education administrator  Tobacco Use   Smoking status: Every Day    Current packs/day: 1.00  Average packs/day: 1 pack/day for 22.0 years (22.0 ttl pk-yrs)    Types: Cigarettes   Smokeless tobacco: Never   Tobacco comments:    Down to 1/2 ppd with chantix   Vaping Use   Vaping status: Former   Substances: Nicotine   Substance and Sexual Activity   Alcohol  use: Yes    Alcohol /week: 0.0 standard drinks of alcohol     Comment: socially   Drug use: No   Sexual activity: Yes  Other Topics Concern   Not on file  Social History Narrative   Paints houses   Wife works at a Retail banker at [employer]   4 children age 5 to 34   Regular  exercise-no current exercise regimen   Caffeine Use-no   Social Drivers of Corporate investment banker Strain: Not on file  Food Insecurity: Not on file  Transportation Needs: Not on file  Physical Activity: Not on file  Stress: Not on file  Social Connections: Not on file   Family History  Problem Relation Age of Onset   Diabetes Mother    Hypertension Mother    Hyperlipidemia Mother    Cancer Father        glioblastoma   No Known Allergies Current Outpatient Medications  Medication Sig Dispense Refill   amLODipine  (NORVASC ) 5 MG tablet TAKE 1 TABLET BY MOUTH EVERYDAY AT BEDTIME 90 tablet 1   Blood Glucose Monitoring Suppl (ACCU-CHEK AVIVA PLUS) w/Device KIT As directed. 1 kit 0   Continuous Glucose Receiver (FREESTYLE LIBRE READER) DEVI 1 Device by Does not apply route as directed. 6 each 3   Continuous Glucose Sensor (FREESTYLE LIBRE 3 PLUS SENSOR) MISC Change sensor every 15 days. 9 each 1   Insulin  Glargine (BASAGLAR  KWIKPEN) 100 UNIT/ML Inject 20 Units into the skin at bedtime. 9 mL 0   insulin  lispro (HUMALOG KWIKPEN) 100 UNIT/ML KwikPen As instructed , per sliding scale. 15 mL 11   lisinopril  (ZESTRIL ) 10 MG tablet Take 1 tablet (10 mg total) by mouth daily. 90 tablet 2   mycophenolate (MYFORTIC) 180 MG EC tablet Take 4 tablets by mouth 2 (two) times daily.     oxyCODONE  (ROXICODONE ) 5 MG immediate release tablet Take 1 tablet (5 mg total) by mouth every 4 (four) hours as needed for severe pain (pain score 7-10) or breakthrough pain. (Patient not taking: Reported on 05/14/2024) 20 tablet 0   rosuvastatin  (CRESTOR ) 10 MG tablet Take 1 tablet (10 mg total) by mouth daily. 90 tablet 3   tacrolimus  (PROGRAF ) 1 MG capsule Take 5 mg by mouth 2 (two) times daily. Pt take 5 mg in the morning and 4 mg at night     varenicline  (CHANTIX ) 0.5 MG tablet Take 1 tablet (0.5 mg total) by mouth 2 (two) times daily. (Patient not taking: Reported on 05/14/2024) 60 tablet 0   No current  facility-administered medications for this visit.   No results found.   Review of Systems:   A ROS was performed including pertinent positives and negatives as documented in the HPI.   Musculoskeletal Exam:    There were no vitals taken for this visit.  Right incision is well-appearing which is subsequently healed..  Range of motion is from 5 degrees to 140 degrees.  Sensation is intact to light touch throughout.  Fires ulnar interosseous nerve distribution as well as radial nerve with strong wrist extension and 2+ radial pulse  Right shoulder is to approximately 120 degrees forward elevation, external rotation at the side is to  50 degrees, internal rotation is to L5.   Imaging:     I personally reviewed and interpreted the radiographs.   Assessment:   53 year old male with right elbow which is essentially achieve maximal medical improvement.  At this time I do believe he has achieved his steady outcome.  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  He did not get persistent relief from his injection   Plan :    - I did discuss that patient could ultimately pursue a right shoulder MRI although that being said this is not currently being covered under his Worker's Comp claim   Elspeth Parker, MD Attending Physician, Orthopedic Surgery  This document was dictated using Dragon voice recognition software. A reasonable attempt at proof reading has been made to minimize errors.

## 2024-05-16 ENCOUNTER — Other Ambulatory Visit (HOSPITAL_COMMUNITY): Payer: Self-pay

## 2024-05-16 ENCOUNTER — Telehealth: Payer: Self-pay | Admitting: *Deleted

## 2024-05-16 NOTE — Telephone Encounter (Signed)
 Pharmacy Patient Advocate Encounter  Received notification from [employer]  that Prior Authorization for HumaLOG KwikPen 100UNIT/ML pen-injectors  has been DENIED.  Full denial letter will be uploaded to the media tab. See denial reason below.   PA #/Case ID/Reference #: [id number]

## 2024-05-16 NOTE — Assessment & Plan Note (Addendum)
 Glutamic Acid Decarb Ab >250 High   Glucose controlled has improved with Basaglar  20 U daily. Post prandial glucose mildly elevated, 200's. His health insurance did not cover DEXCOM. He is not interested in adding fixed IR insulin  before meals but agrees with sliding scale Humalog. Continue same dose of Basaglar . He does not want endocrinology referral at this time. Samples of libre plus sensors given x 2, Rx sent.

## 2024-05-16 NOTE — Telephone Encounter (Signed)
 Copied from CRM [id number]. Topic: Clinical - Medication Prior Auth >> May 16, 2024 10:41 AM [place] J wrote: Reason for CRM: The patient's insulin  lispro (HUMALOG KWIKPEN) 100 UNIT/ML KwikPen is needing a prior authorization per pharmacy.  Please call patient once prior authorization is submitted.

## 2024-05-16 NOTE — Telephone Encounter (Signed)
PA request has been Submitted.

## 2024-05-16 NOTE — Assessment & Plan Note (Signed)
 Currently on ergocalciferol  50,000 U weekly, which was recommended for 12 weeks then he is to continue OTC 2000 U daily.

## 2024-05-16 NOTE — Assessment & Plan Note (Signed)
 BP was initially mildly elevated, improved after a few minutes. Continue Amlodipine  5 mg daily and low salt diet. Monitor BP at home.

## 2024-05-17 ENCOUNTER — Other Ambulatory Visit (HOSPITAL_COMMUNITY): Payer: Self-pay

## 2024-05-17 ENCOUNTER — Other Ambulatory Visit: Payer: Self-pay | Admitting: Family Medicine

## 2024-05-17 ENCOUNTER — Telehealth: Payer: Self-pay | Admitting: *Deleted

## 2024-05-17 ENCOUNTER — Ambulatory Visit (HOSPITAL_COMMUNITY)
Admission: RE | Admit: 2024-05-17 | Discharge: 2024-05-17 | Disposition: A | Discharge status: Home / Self Care | Source: Ambulatory Visit | Attending: Family Medicine | Admitting: Family Medicine

## 2024-05-17 DIAGNOSIS — M79604 Pain in right leg: Secondary | ICD-10-CM | POA: Insufficient documentation

## 2024-05-17 DIAGNOSIS — M79605 Pain in left leg: Secondary | ICD-10-CM | POA: Insufficient documentation

## 2024-05-17 LAB — VAS US ABI WITH/WO TBI
Left ABI: 0.8
Right ABI: 0.67

## 2024-05-17 MED ORDER — BASAGLAR KWIKPEN 100 UNIT/ML ~~LOC~~ SOPN: 20.0000 [IU] | PEN_INJECTOR | Freq: Every day | SUBCUTANEOUS | 0 refills | Status: DC

## 2024-05-17 NOTE — Telephone Encounter (Signed)
 Copied from CRM [id number]. Topic: Clinical - Medication Prior Auth >> May 16, 2024 10:41 AM [place] J wrote: Reason for CRM: The patient's insulin  lispro (HUMALOG KWIKPEN) 100 UNIT/ML KwikPen is needing a prior authorization per pharmacy.  Please call patient once prior authorization is submitted. >> May 17, 2024  1:23 PM Mia F wrote: Pt is calling back about the denial for the medication. He also stated action is requried for the Continuous Glucose Sensor (FREESTYLE LIBRE 3 PLUS SENSOR) MISC

## 2024-05-17 NOTE — Telephone Encounter (Signed)
This has been done already

## 2024-05-17 NOTE — Telephone Encounter (Signed)
 Copied from CRM [id number]. Topic: Clinical - Medication Refill >> May 17, 2024  1:25 PM Mark Miranda wrote: Medication: Insulin  Glargine (BASAGLAR  KWIKPEN) 100 UNIT/ML  Has the patient contacted their pharmacy? No (Agent: If no, request that the patient contact the pharmacy for the refill. If patient does not wish to contact the pharmacy document the reason why and proceed with request.) (Agent: If yes, when and what did the pharmacy advise?)  This is the patient's preferred pharmacy:  [id number] [address], [place] - [address] [place] [address] Phone: [phone] Fax: [phone]   Is this the correct pharmacy for this prescription? Yes If no, delete pharmacy and type the correct one.   Has the prescription been filled recently? Yes  Is the patient out of the medication? No  Has the patient been seen for an appointment in the last year OR does the patient have an upcoming appointment? Yes  Can we respond through MyChart? Yes  Agent: Please be advised that Rx refills may take up to 3 business days. We ask that you follow-up with your pharmacy.

## 2024-05-20 ENCOUNTER — Telehealth: Payer: Self-pay

## 2024-05-20 ENCOUNTER — Other Ambulatory Visit (HOSPITAL_COMMUNITY): Payer: Self-pay

## 2024-05-20 NOTE — Telephone Encounter (Signed)
 Pharmacy Patient Advocate Encounter  Received notification from [facility] MEDICAID that Prior Authorization for [facility] 3 plus sensors has been APPROVED from 05/17/24 to 11/15/24. Ran test claim, Copay is $74.99. This test claim was processed through [facility]- copay amounts may vary at other pharmacies due to pharmacy/plan contracts, or as the patient moves through the different stages of their insurance plan.   PA #/Case ID/Reference #: # [id number]

## 2024-05-20 NOTE — Telephone Encounter (Signed)
 Pharmacy Patient Advocate Encounter   Received notification from Pt Calls Messages that prior authorization for Insulin  Lispro 100 is required/requested.   Insurance verification completed.   The patient is insured through [employer].   Per test claim: The current 30 day co-pay is, $10.00.  No PA needed at this time. This test claim was processed through [facility]- copay amounts may vary at other pharmacies due to pharmacy/plan contracts, or as the patient moves through the different stages of their insurance plan.

## 2024-05-22 ENCOUNTER — Other Ambulatory Visit: Payer: Self-pay | Admitting: Family Medicine

## 2024-05-22 ENCOUNTER — Ambulatory Visit: Payer: Self-pay | Admitting: Family Medicine

## 2024-05-22 DIAGNOSIS — I739 Peripheral vascular disease, unspecified: Secondary | ICD-10-CM

## 2024-05-24 ENCOUNTER — Other Ambulatory Visit: Payer: Self-pay | Admitting: Family Medicine

## 2024-05-24 DIAGNOSIS — E1022 Type 1 diabetes mellitus with diabetic chronic kidney disease: Secondary | ICD-10-CM

## 2024-05-24 MED ORDER — INSULIN LISPRO (1 UNIT DIAL) 100 UNIT/ML (KWIKPEN): PEN_INJECTOR | SUBCUTANEOUS | 11 refills | Status: AC

## 2024-05-24 MED ORDER — INSULIN LISPRO (1 UNIT DIAL) 100 UNIT/ML (KWIKPEN): PEN_INJECTOR | SUBCUTANEOUS | 11 refills | Status: DC

## 2024-05-24 MED ORDER — BASAGLAR KWIKPEN 100 UNIT/ML ~~LOC~~ SOPN: 30.0000 [IU] | PEN_INJECTOR | Freq: Every day | SUBCUTANEOUS | 1 refills | Status: DC

## 2024-05-24 NOTE — Telephone Encounter (Signed)
Rx's sent. ?Thanks, ?BJ ?

## 2024-05-24 NOTE — Addendum Note (Signed)
 Addended by: Jaqualyn Juday G on: 05/24/2024 05:32 PM   Modules accepted: Orders

## 2024-05-27 NOTE — Telephone Encounter (Signed)
 Pt was notified on Friday that Rx was sent. No further action is needed.

## 2024-05-27 NOTE — Telephone Encounter (Signed)
 Pt was contacted on Friday, April 24, 2024. Please see other encounter.

## 2024-05-29 NOTE — Progress Notes (Unsigned)
 Office Note     CC: Bilateral lower extremity claudication with abnormal ABI Requesting Provider:  [name], [name], MD  HPI: Mark Miranda is a 53 y.o. (1971-02-06) male presenting at the request of .[name], [name], MD due to bilateral lower extremity claudication symptoms.  On exam, Mark Miranda was doing well.  Native of [place], he spent several years in [place]  prior to moving to [place] .  He has 4 children, 1 grandchild, [place] which [place].  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  Over the last several years, Mark Miranda has appreciated bilateral lower extremity cramping in his calves.  He states that this occurs after roughly 60+ yards.  He can stop for 1 minutes, and then resume normal activity without issue.  He has history of kidney and pancreas transplant.  The kidney continues to function.  The pancreas has burnt out.   He has diabetes, with an A1c of 8.6.  He is a daily smoker and vapor.  He is working to cut down.  Denies rest pain, denies tissue loss  The pt is  on a statin for cholesterol management.  The pt is  on a daily aspirin .   Other AC:  - The pt is  on medication for hypertension.   The pt is  diabetic.  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   Hypercholesterolemia    Hypertension    Metabolic acidosis    Recurrent peritonitis (HCC)    Type 1 diabetes with renal manifestations, controlled (HCC)    Vitamin D  deficiency     Past Surgical History:  Procedure Laterality Date   AV FISTULA PLACEMENT  01/10/2012   Procedure:  ARTERIOVENOUS (AV) FISTULA CREATION;  Surgeon: Carlin FORBES Haddock, MD;  Location: [facility] OR;  Service: Vascular;  Laterality: Left;  Left Brachial Cephalic Arteriovenous Fistula   CAPD INSERTION  01/09/2012   Procedure: CONTINUOUS AMBULATORY PERITONEAL DIALYSIS  (CAPD) CATHETER INSERTION;  Surgeon: Elon CHRISTELLA Pacini, MD;  Location: [facility] OR;  Service: General;  Laterality: N/A;  Exteriorization PD Cath   CAPSULOTOMY Right 11/07/2023   Procedure: CAPSULOTOMY, ELBOW;  Surgeon: Genelle Standing, MD;  Location: [facility];  Service: Orthopedics;  Laterality: Right;  RIGHT ELBOW CAPSULAR RELEASE   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   received left kidney, 3 renal arteries, 1 renal vein, ureter and pancreas from 53 year old   HARDWARE REMOVAL Right 11/07/2023   Procedure: REMOVAL, HARDWARE;  Surgeon: Genelle Standing, MD;  Location: [facility];  Service: Orthopedics;  Laterality: Right;   INSERTION OF DIALYSIS CATHETER  01/13/2012   Procedure: INSERTION OF DIALYSIS CATHETER;  Surgeon: Krystal JULIANNA Doing, MD;  Location: [facility] OR;  Service: Vascular;  Laterality: Right;  Internal Jugular   LAPAROSCOPY  03/13/2012   Procedure: LAPAROSCOPY DIAGNOSTIC;  Surgeon: Standing KYM Schultze, MD;  Location: [facility] OR;  Service: General;  Laterality: N/A;   LIVER BIOPSY  04/02/2013   open   ORIF HUMERUS FRACTURE Right 03/07/2023   Procedure: OPEN REDUCTION INTERNAL FIXATION (  ORIF) DISTAL HUMERUS FRACTURE;  Surgeon: Genelle Standing, MD;  Location: [facility] ORS;  Service: Orthopedics;  Laterality: Right;   PERITONEAL CATHETER INSERTION     PERITONEAL CATHETER REMOVAL     SMALL INTESTINE SURGERY  04/02/2013   TRICEPS TENDON REPAIR  03/07/2023   Procedure: TRICEPS TENDON REPAIR;  Surgeon: Genelle Standing, MD;  Location: [facility] ORS;  Service: Orthopedics;;   ULNAR NERVE TRANSPOSITION Right 03/07/2023   Procedure: ULNAR NERVE EXPLORATION;  Surgeon: Genelle Standing, MD;  Location: [facility]S;  Service: Orthopedics;   Laterality: Right;    Social History   Socioeconomic History   Marital status: Married    Spouse name: [name]   Number of children: 4   Years of education: 10   Highest education level: Not on file  Occupational History   Occupation: Education administrator  Tobacco Use   Smoking status: Every Day    Current packs/day: 1.00    Average packs/day: 1 pack/day for 22.0 years (22.0 ttl pk-yrs)    Types: Cigarettes   Smokeless tobacco: Never   Tobacco comments:    Down to 1/2 ppd with chantix   Vaping Use   Vaping status: Former   Substances: Nicotine   Substance and Sexual Activity   Alcohol  use: Yes    Alcohol /week: 0.0 standard drinks of alcohol     Comment: socially   Drug use: No   Sexual activity: Yes  Other Topics Concern   Not on file  Social History Narrative   Paints houses   Wife works at a Retail banker at [employer]   4 children age 106 to 33   Regular exercise-no current exercise regimen   Caffeine Use-no   Social Drivers of Corporate investment banker Strain: Not on file  Food Insecurity: Not on file  Transportation Needs: Not on file  Physical Activity: Not on file  Stress: Not on file  Social Connections: Not on file  Intimate Partner Violence: Not on file   Family History  Problem Relation Age of Onset   Diabetes Mother    Hypertension Mother    Hyperlipidemia Mother    Cancer Father        glioblastoma    Current Outpatient Medications  Medication Sig Dispense Refill   amLODipine  (NORVASC ) 5 MG tablet TAKE 1 TABLET BY MOUTH EVERYDAY AT BEDTIME 90 tablet 1   Blood Glucose Monitoring Suppl (ACCU-CHEK AVIVA PLUS) w/Device KIT As directed. 1 kit 0   Continuous Glucose Receiver (FREESTYLE LIBRE READER) DEVI 1 Device by Does not apply route as directed. 6 each 3   Continuous Glucose Sensor (FREESTYLE LIBRE 3 PLUS SENSOR) MISC Change sensor every 15 days. 9 each 1   Insulin  Glargine (BASAGLAR  KWIKPEN) 100 UNIT/ML Inject 30 Units into the skin at bedtime. 9 mL 1   insulin   lispro (HUMALOG KWIKPEN) 100 UNIT/ML KwikPen Check blood sugars before meals (TID) and take insulin  according to reading:140-180 6 U 181-220 8 U 221-260 10 U 261-300 12 U 301-350 14 U 350-400 16 U 15 mL 11   lisinopril  (ZESTRIL ) 10 MG tablet Take 1 tablet (10 mg total) by mouth daily. 90 tablet 2   mycophenolate (MYFORTIC) 180 MG EC tablet Take 4 tablets by mouth 2 (two) times daily.     oxyCODONE  (ROXICODONE ) 5 MG immediate release tablet Take 1 tablet (5 mg total) by mouth every 4 (four) hours as needed for severe pain (pain score 7-10) or breakthrough pain. (Patient not taking: Reported on 05/14/2024) 20 tablet 0   rosuvastatin  (CRESTOR ) 10  MG tablet Take 1 tablet (10 mg total) by mouth daily. 90 tablet 3   tacrolimus  (PROGRAF ) 1 MG capsule Take 5 mg by mouth 2 (two) times daily. Pt take 5 mg in the morning and 4 mg at night     varenicline  (CHANTIX ) 0.5 MG tablet Take 1 tablet (0.5 mg total) by mouth 2 (two) times daily. (Patient not taking: Reported on 05/14/2024) 60 tablet 0   No current facility-administered medications for this visit.    No Known Allergies   REVIEW OF SYSTEMS:  [X]  denotes positive finding, [ ]  denotes negative finding Cardiac  Comments:  Chest pain or chest pressure:    Shortness of breath upon exertion:    Short of breath when lying flat:    Irregular heart rhythm:        Vascular    Pain in calf, thigh, or hip brought on by ambulation:    Pain in feet at night that wakes you up from your sleep:     Blood clot in your veins:    Leg swelling:         Pulmonary    Oxygen at home:    Productive cough:     Wheezing:         Neurologic    Sudden weakness in arms or legs:     Sudden numbness in arms or legs:     Sudden onset of difficulty speaking or slurred speech:    Temporary loss of vision in one eye:     Problems with dizziness:         Gastrointestinal    Blood in stool:     Vomited blood:         Genitourinary    Burning when urinating:      Blood in urine:        Psychiatric    Major depression:         Hematologic    Bleeding problems:    Problems with blood clotting too easily:        Skin    Rashes or ulcers:        Constitutional    Fever or chills:      PHYSICAL EXAMINATION:  There were no vitals filed for this visit.  General:  WDWN in NAD; vital signs documented above Gait: Not observed HENT: WNL, normocephalic Pulmonary: normal non-labored breathing , without wheezing Cardiac: regular HR Abdomen: soft, NT, no masses Skin: without rashes Vascular Exam/Pulses:  Right Left  Radial 2+ (normal) 2+ (normal)  Ulnar    Femoral 2+ (normal) 2+ (normal)  Popliteal    DP 2+ (normal) 2+ (normal)  PT     Extremities: without ischemic changes, without Gangrene , without cellulitis; without open wounds;  Musculoskeletal: no muscle wasting or atrophy  Neurologic: A&O X 3;  No focal weakness or paresthesias are detected Psychiatric:  The pt has Normal affect.   Non-Invasive Vascular Imaging:    ABI Findings:  +---------+------------------+-----+----------+--------+  Right   Rt Pressure (mmHg)IndexWaveform  Comment   +---------+------------------+-----+----------+--------+  Brachial 141                                        +---------+------------------+-----+----------+--------+  PTA     103               0.67 monophasic          +---------+------------------+-----+----------+--------+  DP      86                0.56 monophasic          +---------+------------------+-----+----------+--------+  Great Toe59                0.38                     +---------+------------------+-----+----------+--------+   +---------+------------------+-----+----------+-------+  Left    Lt Pressure (mmHg)IndexWaveform  Comment  +---------+------------------+-----+----------+-------+  Brachial 154                                        +---------+------------------+-----+----------+-------+  PTA     121               0.79 monophasic         +---------+------------------+-----+----------+-------+  DP      123               0.80 monophasic         +---------+------------------+-----+----------+-------+  Great Toe42                0.27                    +---------+------------------+-----+----------+-------+     ASSESSMENT/PLAN: Rondell Pardon is a 53 y.o. male presenting with bilateral lower extremity claudication.  Symptoms are moderate, occurring after roughly 60+ yards.  He has no rest pain, no tissue loss.  On physical exam, he had 1+ palpable femoral pulses.  No palpable pedal pulses. ABI was reviewed demonstrating moderate peripheral arterial disease bilaterally.  I think a large portion of his peripheral arterial disease is likely inflow disease.  Regardless, we discussed the importance of lifestyle modification such as smoking cessation, walking program in an effort to decrease likelihood of worsening symptoms.  We also discussed that he should continue taking high intensity statin and aspirin .  Mark Miranda is aware that intervention for claudication actually increases his long-term risk of amputation.  At his young age, he would be best served with continued medical management.  My plan is to see him in 1 years time.  Should symptoms worsen, or remain similar to today's exam, we would discuss adding Pletal.    Fonda FORBES Rim, MD [facility] [phone]

## 2024-05-30 ENCOUNTER — Ambulatory Visit: Attending: Vascular Surgery | Admitting: Vascular Surgery

## 2024-05-30 ENCOUNTER — Encounter: Payer: Self-pay | Admitting: Vascular Surgery

## 2024-05-30 VITALS — BP 148/79 | HR 83 | Temp 98.5°F | Resp 18 | Ht 68.0 in | Wt 154.2 lb

## 2024-05-30 DIAGNOSIS — I70213 Atherosclerosis of native arteries of extremities with intermittent claudication, bilateral legs: Secondary | ICD-10-CM | POA: Diagnosis not present

## 2024-05-30 NOTE — Addendum Note (Signed)
 Addended by: LANIS CHEW E on: 05/30/2024 09:13 AM   Modules accepted: Level of Service

## 2024-06-04 ENCOUNTER — Ambulatory Visit (HOSPITAL_COMMUNITY)
Admission: RE | Admit: 2024-06-04 | Discharge: 2024-06-04 | Disposition: A | Discharge status: Home / Self Care | Source: Ambulatory Visit | Attending: Orthopaedic Surgery | Admitting: Orthopaedic Surgery

## 2024-06-04 DIAGNOSIS — M7501 Adhesive capsulitis of right shoulder: Secondary | ICD-10-CM | POA: Insufficient documentation

## 2024-06-07 ENCOUNTER — Ambulatory Visit (INDEPENDENT_AMBULATORY_CARE_PROVIDER_SITE_OTHER): Payer: Worker's Compensation | Admitting: Orthopaedic Surgery

## 2024-06-07 DIAGNOSIS — M7501 Adhesive capsulitis of right shoulder: Secondary | ICD-10-CM | POA: Diagnosis not present

## 2024-06-07 NOTE — Progress Notes (Addendum)
 Post Operative Evaluation     Interval History:   Presents today for follow-up of the right shoulder.  He is here today for MRI discussion.  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   Hypercholesterolemia    Hypertension    Metabolic acidosis    Recurrent peritonitis (HCC)    Type 1 diabetes with renal manifestations, controlled (HCC)    Vitamin D  deficiency    Past Surgical History:  Procedure Laterality Date   AV FISTULA PLACEMENT  01/10/2012   Procedure: ARTERIOVENOUS (AV) FISTULA CREATION;  Surgeon: Carlin FORBES Haddock, MD;  Location: [facility] OR;  Service: Vascular;  Laterality: Left;  Left Brachial Cephalic Arteriovenous Fistula   CAPD INSERTION  01/09/2012   Procedure: CONTINUOUS AMBULATORY PERITONEAL DIALYSIS  (CAPD) CATHETER INSERTION;  Surgeon: Elon CHRISTELLA Pacini, MD;  Location: [facility] OR;  Service: General;  Laterality: N/A;  Exteriorization PD Cath   CAPSULOTOMY Right 11/07/2023   Procedure: CAPSULOTOMY, ELBOW;  Surgeon: Genelle Standing, MD;  Location: [facility];  Service: Orthopedics;  Laterality: Right;  RIGHT ELBOW CAPSULAR RELEASE   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   received left kidney, 3 renal arteries, 1 renal vein, ureter and pancreas from 53 year old   HARDWARE REMOVAL Right 11/07/2023   Procedure: REMOVAL, HARDWARE;  Surgeon: Genelle Standing, MD;  Location: [facility];  Service: Orthopedics;  Laterality: Right;   INSERTION OF DIALYSIS CATHETER  01/13/2012   Procedure:  INSERTION OF DIALYSIS CATHETER;  Surgeon: Krystal JULIANNA Doing, MD;  Location: [facility] OR;  Service: Vascular;  Laterality: Right;  Internal Jugular   LAPAROSCOPY  03/13/2012   Procedure: LAPAROSCOPY DIAGNOSTIC;  Surgeon: Standing KYM Schultze, MD;  Location: [facility] OR;  Service: General;  Laterality: N/A;   LIVER BIOPSY  04/02/2013   open   ORIF HUMERUS FRACTURE Right 03/07/2023   Procedure: OPEN REDUCTION INTERNAL FIXATION (ORIF) DISTAL HUMERUS FRACTURE;  Surgeon: Genelle Standing, MD;  Location: [facility] ORS;  Service: Orthopedics;  Laterality: Right;   PERITONEAL CATHETER INSERTION     PERITONEAL CATHETER REMOVAL     SMALL INTESTINE SURGERY  04/02/2013   TRICEPS TENDON REPAIR  03/07/2023   Procedure: TRICEPS TENDON REPAIR;  Surgeon: Genelle Standing, MD;  Location: [facility] ORS;  Service: Orthopedics;;   ULNAR NERVE TRANSPOSITION Right 03/07/2023   Procedure: ULNAR NERVE EXPLORATION;  Surgeon: Genelle Standing, MD;  Location: [facility] ORS;  Service: Orthopedics;  Laterality: Right;   Social History   Socioeconomic History   Marital status: Married    Spouse name: [name]   Number of children: 4   Years of education: 10   Highest education level: Not on file  Occupational History   Occupation: Education Administrator  Tobacco Use   Smoking status: Every Day    Current packs/day: 1.00    Average packs/day: 1 pack/day for 22.0 years (22.0 ttl pk-yrs)    Types: Cigarettes   Smokeless  tobacco: Never   Tobacco comments:    Down to 1/2 ppd with chantix   Vaping Use   Vaping status: Former   Substances: Nicotine   Substance and Sexual Activity   Alcohol  use: Yes    Alcohol /week: 0.0 standard drinks of alcohol     Comment: socially   Drug use: No   Sexual activity: Yes  Other Topics Concern   Not on file  Social History Narrative   Paints houses   Wife works at a retail banker at [employer]   4 children age 48 to 76   Regular exercise-no current exercise regimen   Caffeine Use-no   Social Drivers of Corporate Investment Banker Strain: Not  on file  Food Insecurity: Not on file  Transportation Needs: Not on file  Physical Activity: Not on file  Stress: Not on file  Social Connections: Not on file   Family History  Problem Relation Age of Onset   Diabetes Mother    Hypertension Mother    Hyperlipidemia Mother    Cancer Father        glioblastoma   No Known Allergies Current Outpatient Medications  Medication Sig Dispense Refill   amLODipine  (NORVASC ) 5 MG tablet TAKE 1 TABLET BY MOUTH EVERYDAY AT BEDTIME 90 tablet 1   Blood Glucose Monitoring Suppl (ACCU-CHEK AVIVA PLUS) w/Device KIT As directed. 1 kit 0   Continuous Glucose Receiver (FREESTYLE LIBRE READER) DEVI 1 Device by Does not apply route as directed. 6 each 3   Continuous Glucose Sensor (FREESTYLE LIBRE 3 PLUS SENSOR) MISC Change sensor every 15 days. 9 each 1   Insulin  Glargine (BASAGLAR  KWIKPEN) 100 UNIT/ML Inject 30 Units into the skin at bedtime. 9 mL 1   insulin  lispro (HUMALOG KWIKPEN) 100 UNIT/ML KwikPen Check blood sugars before meals (TID) and take insulin  according to reading:140-180 6 U 181-220 8 U 221-260 10 U 261-300 12 U 301-350 14 U 350-400 16 U 15 mL 11   lisinopril  (ZESTRIL ) 10 MG tablet Take 1 tablet (10 mg total) by mouth daily. 90 tablet 2   mycophenolate (MYFORTIC) 180 MG EC tablet Take 4 tablets by mouth 2 (two) times daily.     rosuvastatin  (CRESTOR ) 10 MG tablet Take 1 tablet (10 mg total) by mouth daily. 90 tablet 3   STUDY - ARCADIA - aspirin  81mg  or placebo (PI - Sethi) Take 81 mg by mouth daily.     tacrolimus  (PROGRAF ) 1 MG capsule Take 5 mg by mouth 2 (two) times daily. Pt take 5 mg in the morning and 4 mg at night     No current facility-administered medications for this visit.   No results found.   Review of Systems:   A ROS was performed including pertinent positives and negatives as documented in the HPI.   Musculoskeletal Exam:    There were no vitals taken for this visit.   Right shoulder is to approximately 120  degrees forward elevation, external rotation at the side is to 50 degrees, internal rotation is to L5.   Imaging:    MRI right shoulder: There is thickening of the inferior glenohumeral ligament consistent with adhesive capsulitis  I personally reviewed and interpreted the radiographs.   Assessment:   53 year old male with right shoulder pain consistent with adhesive capsulitis.  At this time he has trialed an injection which gave him very limited relief with range of motion.  He is still unable to achieve more than 90 degrees of abduction and 140 degrees of forward elevation.  Given this I did discuss treatment options.  Given the fact that he has trialed and failed a steroid injection I did discuss the possibility of arthroscopic release and manipulation under anesthesia.  Ultimately I do believe this would assist and improve his range of motion and overall shoulder function.  He has elected to proceed with this.  I did discuss that ultimately I do believe he is able to continue to work prior to the surgery   Plan :    - Plan for right shoulder arthroscopy with lysis of adhesions and manipulation under anesthesia   After a lengthy discussion of treatment options, including risks, benefits, alternatives, complications of surgical and nonsurgical conservative options, the patient elected surgical repair.   The patient  is aware of the material risks  and complications including, but not limited to injury to adjacent structures, neurovascular injury, infection, numbness, bleeding, implant failure, thermal burns, stiffness, persistent pain, failure to heal, disease transmission from allograft, need for further surgery, dislocation, anesthetic risks, blood clots, risks of death,and others. The probabilities of surgical success and failure discussed with patient given their particular co-morbidities.The time and nature of expected rehabilitation and recovery was discussed.The patient's questions  were all answered preoperatively.  No barriers to understanding were noted. I explained the natural history of the disease process and Rx rationale.  I explained to the patient what I considered to be reasonable expectations given their personal situation.  The final treatment plan was arrived at through a shared patient decision making process model.    Elspeth Parker, MD Attending Physician, Orthopedic Surgery  This document was dictated using Dragon voice recognition software. A reasonable attempt at proof reading has been made to minimize errors.

## 2024-06-10 ENCOUNTER — Encounter: Payer: Self-pay | Admitting: Radiology

## 2024-06-18 ENCOUNTER — Encounter: Admitting: Vascular Surgery

## 2024-06-18 ENCOUNTER — Other Ambulatory Visit: Payer: Self-pay | Admitting: Family Medicine

## 2024-06-18 DIAGNOSIS — E1022 Type 1 diabetes mellitus with diabetic chronic kidney disease: Secondary | ICD-10-CM

## 2024-06-18 MED ORDER — BASAGLAR KWIKPEN 100 UNIT/ML ~~LOC~~ SOPN: 30.0000 [IU] | PEN_INJECTOR | Freq: Every day | SUBCUTANEOUS | 1 refills | Status: DC

## 2024-06-19 ENCOUNTER — Encounter (HOSPITAL_BASED_OUTPATIENT_CLINIC_OR_DEPARTMENT_OTHER): Payer: Self-pay | Admitting: Orthopaedic Surgery

## 2024-06-19 ENCOUNTER — Telehealth (HOSPITAL_BASED_OUTPATIENT_CLINIC_OR_DEPARTMENT_OTHER): Payer: Self-pay | Admitting: Orthopaedic Surgery

## 2024-06-19 ENCOUNTER — Ambulatory Visit (HOSPITAL_BASED_OUTPATIENT_CLINIC_OR_DEPARTMENT_OTHER): Payer: Worker's Compensation | Admitting: Orthopaedic Surgery

## 2024-06-19 NOTE — Telephone Encounter (Signed)
 Patient wants to know if he needs to keep the appointment that he has or cx. Please advise [phone]

## 2024-06-20 ENCOUNTER — Ambulatory Visit (HOSPITAL_BASED_OUTPATIENT_CLINIC_OR_DEPARTMENT_OTHER): Payer: Self-pay | Admitting: Orthopaedic Surgery

## 2024-07-15 ENCOUNTER — Ambulatory Visit: Admitting: Family Medicine

## 2024-07-16 ENCOUNTER — Ambulatory Visit: Admitting: Family Medicine

## 2024-07-16 ENCOUNTER — Encounter: Payer: Self-pay | Admitting: Family Medicine

## 2024-07-16 VITALS — BP 155/80 | HR 78 | Temp 97.9°F | Resp 16 | Ht 68.0 in | Wt 166.4 lb

## 2024-07-16 DIAGNOSIS — R0789 Other chest pain: Secondary | ICD-10-CM | POA: Diagnosis not present

## 2024-07-16 DIAGNOSIS — E1022 Type 1 diabetes mellitus with diabetic chronic kidney disease: Secondary | ICD-10-CM

## 2024-07-16 DIAGNOSIS — E785 Hyperlipidemia, unspecified: Secondary | ICD-10-CM

## 2024-07-16 DIAGNOSIS — I10 Essential (primary) hypertension: Secondary | ICD-10-CM

## 2024-07-16 LAB — POCT GLYCOSYLATED HEMOGLOBIN (HGB A1C): Hemoglobin A1C: 7.2 % — AB (ref 4.0–5.6)

## 2024-07-16 MED ORDER — BASAGLAR KWIKPEN 100 UNIT/ML ~~LOC~~ SOPN: 33.0000 [IU] | PEN_INJECTOR | Freq: Every day | SUBCUTANEOUS | 1 refills | Status: AC

## 2024-07-16 MED ORDER — AMLODIPINE BESYLATE 10 MG PO TABS: 10.0000 mg | ORAL_TABLET | Freq: Every day | ORAL | 1 refills | Status: AC

## 2024-07-16 NOTE — Progress Notes (Signed)
 Chief Complaint  Patient presents with   Medical Management of Chronic Issues    Nine week follow-up    Discussed the use of AI scribe software for clinical note transcription with the patient, who gave verbal consent to proceed. History of Present Illness Mark Miranda is a 53 year old male with PMHx significant for vitamin D  deficiency, HTN, DM I, chronic fatigue, chronic pain, ESRD S/P kidney/pancreas transplant in 2014  here today for chronic disease management. Last seen on 05/14/24. Since his last visit he has seen vascular.  PAD with claudication like symptoms. He continues to smoke. He takes garlic supplements, believing they help with arterial health.  He is scheduled for right shoulder surgery on July 23, 2024.   He reports a recent episode of  mid chest pressure but no associated symptoms like dizziness, diaphoresis, or palpitations.Pain lasted a few seconds, he was seated but thinks it was due to cold weather, felt better after hot shower.  Hypertension: Currently he is on amlodipine  5 mg daily and lisinopril  10 mg daily. He is not monitoring BP at home. Negative for unusual or severe headache, visual changes, exertional chest pain, dyspnea,  focal weakness, or edema.  Lab Results  Component Value Date   CREATININE 1.16 11/07/2023   BUN 22 (H) 11/07/2023   NA 136 11/07/2023   K 4.9 11/07/2023   CL 105 11/07/2023   CO2 18 (L) 11/07/2023   Diabetes Mellitus I:S/P pancreatic transplant.Initially he was normoglycemic but HgA1C has gradually increased.  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  Blood sugar levels are generally below 200 mg/dL, averaging around 849 mg/dL. He uses Basaglar  28 units daily and 4 to 8 units of Humalog  depending on meals. He notes morning blood sugar spikes upon waking. He has referral to endocrinologist placed a few weeks ago. Negative for symptoms of  hypoglycemia, polyuria, polydipsia, numbness extremities, foot ulcers/trauma  Lab Results  Component Value Date   HGBA1C 8.6 (A) 03/13/2024   Lab Results  Component Value Date   MICROALBUR 207.00 (H) 04/15/2008   Hyperlipidemia: Currently on rosuvastatin  10 mg daily.  Lab Results  Component Value Date   CHOL 222 (H) 03/13/2024   HDL 48.60 03/13/2024   LDLCALC 151 (H) 03/13/2024   LDLDIRECT 142.0 07/17/2012   TRIG 110.0 03/13/2024   CHOLHDL 5 03/13/2024   Review of Systems  Constitutional:  Negative for activity change, appetite change, chills and fever.  HENT:  Negative for sore throat.   Respiratory:  Negative for cough and wheezing.   Gastrointestinal:  Negative for abdominal pain, nausea and vomiting.  Genitourinary:  Negative for decreased urine volume, dysuria and hematuria.  Skin:  Negative for rash.  Neurological:  Negative for syncope and facial asymmetry.  Psychiatric/Behavioral:  Negative for confusion.   See other pertinent positives and negatives in HPI.  Current Outpatient Medications on File Prior to Visit  Medication Sig Dispense Refill   amLODipine  (NORVASC ) 5 MG tablet TAKE 1 TABLET BY MOUTH EVERYDAY AT BEDTIME 90 tablet 1   Blood Glucose Monitoring Suppl (ACCU-CHEK AVIVA PLUS) w/Device KIT As directed. 1 kit 0   Continuous Glucose Receiver (FREESTYLE LIBRE READER) DEVI 1 Device by Does not apply route as directed. 6 each 3   Continuous Glucose Sensor (FREESTYLE LIBRE 3 PLUS SENSOR) MISC Change sensor every 15 days. 9 each 1   insulin  lispro (HUMALOG  KWIKPEN) 100 UNIT/ML KwikPen Check blood sugars before meals (TID)  and take insulin  according to reading:140-180 6 U 181-220 8 U 221-260 10 U 261-300 12 U 301-350 14 U 350-400 16 U 15 mL 11   lisinopril  (ZESTRIL ) 10 MG tablet Take 1 tablet (10 mg total) by mouth daily. 90 tablet 2   mycophenolate (MYFORTIC) 180 MG EC tablet Take 4 tablets by mouth 2 (two) times daily.     rosuvastatin  (CRESTOR ) 10 MG tablet Take  1 tablet (10 mg total) by mouth daily. 90 tablet 3   STUDY - ARCADIA - aspirin  81mg  or placebo (PI - Sethi) Take 81 mg by mouth daily.     tacrolimus  (PROGRAF ) 1 MG capsule Take 5 mg by mouth 2 (two) times daily. Pt take 5 mg in the morning and 4 mg at night     [DISCONTINUED] diltiazem  (CARDIZEM ) 30 MG tablet Take 30 mg by mouth 4 (four) times daily. Ask patient to verify dosage and frequency. Not indicated on medical history form dated 10/23/10.      No current facility-administered medications on file prior to visit.   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   Hypercholesterolemia    Hypertension    Metabolic acidosis    Recurrent peritonitis (HCC)    Type 1 diabetes with renal manifestations, controlled (HCC)    Vitamin D  deficiency    No Known Allergies  Social History   Socioeconomic History   Marital status: Married    Spouse name: [name]   Number of children: 4   Years of education: 10   Highest education level: Not on file  Occupational History   Occupation: Education Administrator  Tobacco Use   Smoking status: Every Day    Current packs/day: 1.00    Average packs/day: 1 pack/day for 22.0 years (22.0 ttl pk-yrs)    Types: Cigarettes   Smokeless tobacco: Never   Tobacco comments:    Down to 1/2 ppd with chantix   Vaping Use   Vaping status: Former   Substances: Nicotine   Substance and Sexual Activity   Alcohol  use: Yes    Alcohol /week: 0.0 standard drinks of alcohol     Comment: socially   Drug use: No   Sexual activity: Yes  Other Topics Concern   Not on file  Social History Narrative   Paints houses   Wife works at a retail banker at [employer]   4 children age 6 to 19   Regular  exercise-no current exercise regimen   Caffeine Use-no   Social Drivers of Corporate Investment Banker Strain: Not on file  Food Insecurity: Not on file  Transportation Needs: Not on file  Physical Activity: Not on file  Stress: Not on file  Social Connections: Not on file   Today's Vitals   07/16/24 1001 07/16/24 1049  BP: (!) 160/80 (!) 155/80  Pulse: 78   Resp: 16   Temp: 97.9 F (36.6 C)   SpO2: 98%   Weight: 166 lb 6.4 oz (75.5 kg)   Height: 5' 8 (1.727 m)    Body mass index is 25.3 kg/m.  Physical Exam Vitals and nursing note reviewed.  Constitutional:      General: He is not in acute distress.  Appearance: He is well-developed.  HENT:     Head: Normocephalic and atraumatic.     Mouth/Throat:     Mouth: Mucous membranes are moist.     Pharynx: Oropharynx is clear.  Eyes:     Conjunctiva/sclera: Conjunctivae normal.  Cardiovascular:     Rate and Rhythm: Normal rate and regular rhythm.     Heart sounds: No murmur heard. Pulmonary:     Effort: Pulmonary effort is normal. No respiratory distress.     Breath sounds: Normal breath sounds.  Abdominal:     Palpations: Abdomen is soft. There is no hepatomegaly or mass.     Tenderness: There is no abdominal tenderness.  Skin:    General: Skin is warm.     Findings: No erythema or rash.  Neurological:     General: No focal deficit present.     Mental Status: He is alert and oriented to person, place, and time.     Gait: Gait normal.  Psychiatric:        Mood and Affect: Mood and affect normal.    ASSESSMENT AND PLAN:  [name] was seen today for medical management of chronic issues.  Diagnoses and all orders for this visit:  Orders Placed This Encounter  Procedures   Lipid panel   Ambulatory referral to Cardiology   POC HgB A1c   EKG 12-Lead   Lab Results  Component Value Date   HGBA1C 7.2 (A) 07/16/2024   Type 1 diabetes mellitus with diabetic chronic kidney disease, unspecified CKD  stage (HCC) Assessment & Plan: Hemoglobin A1c has improved, it went from 8.6 to 7.2 today. Recommend increasing Basaglar  from 28 units to 33 units. No changes in Humalog . Pending appointment with endocrinologist. He is overdue for eye exam.  We discussed possible ophthalmologic complications. Continue appropriate footcare. Follow-up in 4 to 5 months, if not able to see endocrinologist at that time.  Orders: -     POCT glycosylated hemoglobin (Hb A1C) -     Basaglar  KwikPen; Inject 33 Units into the skin at bedtime.  Dispense: 15 mL; Refill: 1  Primary hypertension Assessment & Plan: BP is not well-controlled. He is not monitoring BP at home, recommend doing so. We discussed options, he is not interested in adding medications (beta-blockers), so amlodipine  increased from 5 mg to 10 mg. Continue lisinopril  10 mg daily and low-salt diet. Monitor BP at home, instructed to let me know about BP readings in 3 to 4 weeks. Follow-up in 5 months.  Chest pressure One single episode last week, no associated symptoms and it happens at rest. He has a bed at CVD risk. EKG done today:SR, LAD, T wave abnormalities antero lateral leads. When compared with last EKG, 02/2023,new T wave abn from V4-V6. Currently he is asymptomatic. Smoking cessation encouraged. Instructed about warning signs. Appointment with cardiology will be arranged.  -     EKG 12-Lead -     Ambulatory referral to Cardiology  Hyperlipidemia LDL goal <70 Assessment & Plan: LDL is not at goal, it was 151 in 03/2024. He is not fasting today, he will arrange appointment for fasting labs. Continue rosuvastatin  10 mg daily.  Orders: -     Lipid panel; Future  I personally spent a total of 42 minutes in the care of the patient today including preparing to see the patient, getting/reviewing separately obtained history, performing a medically appropriate exam/evaluation, counseling and educating, placing orders, documenting clinical  information in the EHR, and independently interpreting results.This  time does not include time used to perform EKG.  Return in about 5 months (around 12/14/2024) for chronic problems.  Raymundo Rout, MD [facility]. [place] office.

## 2024-07-16 NOTE — Assessment & Plan Note (Signed)
 LDL is not at goal, it was 151 in 03/2024. He is not fasting today, he will arrange appointment for fasting labs. Continue rosuvastatin  10 mg daily.

## 2024-07-16 NOTE — Assessment & Plan Note (Signed)
 BP is not well-controlled. He is not monitoring BP at home, recommend doing so. We discussed options, he is not interested in adding medications (beta-blockers), so amlodipine  increased from 5 mg to 10 mg. Continue lisinopril  10 mg daily and low-salt diet. Monitor BP at home, instructed to let me know about BP readings in 3 to 4 weeks. Follow-up in 5 months.

## 2024-07-16 NOTE — Patient Instructions (Addendum)
 A few things to remember from today's visit:  Type 1 diabetes mellitus with diabetic chronic kidney disease, unspecified CKD stage (HCC) - Plan: POC HgB A1c  Primary hypertension  Chest pressure - Plan: EKG 12-Lead, Ambulatory referral to Cardiology  Hyperlipidemia LDL goal <70  Aumented el Basaglar  a 33 U diarias. No cambios en Humalog . Appt para lab en aynas. Pendiente cita con endocrinologo.  Midase la presion en la casa y me have saber de numerois en 1-2 semanas.   Information for Referral #: [id number]   Diagnoses:   E10.22 (ICD-10-CM) - Type 1 diabetes mellitus with diabetic chronic kidney disease, unspecified CKD stage (HCC)   Procedures: MZQ77 (Custom) - AMB REFERRAL TO ENDOCRINOLOGY Authorization #:     Referring Provider Information Kamaron Deskins G Johntae Broxterman [address] [place] [address]  [phone] Referring To Provider Information [facility] [address] [place], [address] [place] [address] [phone]  Referral Start Date: 06/18/2024 Referral End Date: 06/18/2025   If you need refills for medications you take chronically, please call your pharmacy. Do not use My Chart to request refills or for acute issues that need immediate attention. If you send a my chart message, it may take a few days to be addressed, specially if I am not in the office.  Please be sure medication list is accurate. If a new problem present, please set up appointment sooner than planned today.

## 2024-07-16 NOTE — Assessment & Plan Note (Signed)
 Hemoglobin A1c has improved, it went from 8.6 to 7.2 today. Recommend increasing Basaglar  from 28 units to 33 units. No changes in Humalog . Pending appointment with endocrinologist. He is overdue for eye exam. Continue appropriate footcare. Follow-up in 4 to 5 months, if not able to see endocrinologist at that time.

## 2024-07-17 ENCOUNTER — Telehealth (HOSPITAL_BASED_OUTPATIENT_CLINIC_OR_DEPARTMENT_OTHER): Payer: Self-pay | Admitting: Orthopaedic Surgery

## 2024-07-17 NOTE — Telephone Encounter (Signed)
 Dr Saunders called and needs Dr B to call him back because the patient needs to be seen by a Cardiologist before his surgery. Dr Madalyn contact cell phone [phone]

## 2024-07-18 ENCOUNTER — Telehealth: Payer: Self-pay | Admitting: Orthopaedic Surgery

## 2024-07-18 NOTE — Telephone Encounter (Signed)
 This is a [facility] patient.Patient's surgery for 07/23/24 has been postponed. GP noted a few abnormalities at 07/16/24 office visit and has referred patient to Cardiology. Patient will have to be cleared with Cardiology before he can proceed with surgery.

## 2024-07-19 ENCOUNTER — Telehealth: Payer: Self-pay

## 2024-07-19 NOTE — Telephone Encounter (Signed)
 Patient was scheduled to see the cardiologist on 07/22/24. Patient does not have any further questions at this time.   Copied from CRM #[id number]. Topic: General - Other >> Jul 18, 2024 10:50 AM Carlyon D wrote: Reason for CRM: Dr. Saunders from [facility]  scheduled 12/16 for surgery going thru chart had visit wit Dr.  jordan 12/09 for chest discomfort,  EKG changes schedule. Was to have  pt to follow up with cardiology. Dr. Saunders stated he didn't know if pt was scheduled for cardiology he needs to know moving forward for surgery. Please call Dr. Saunders back  to further discuss  Call back # [phone]

## 2024-07-22 ENCOUNTER — Ambulatory Visit: Attending: Nurse Practitioner | Admitting: Nurse Practitioner

## 2024-07-22 ENCOUNTER — Telehealth: Payer: Self-pay

## 2024-07-22 ENCOUNTER — Other Ambulatory Visit (HOSPITAL_BASED_OUTPATIENT_CLINIC_OR_DEPARTMENT_OTHER): Payer: Self-pay

## 2024-07-22 ENCOUNTER — Other Ambulatory Visit (HOSPITAL_COMMUNITY): Payer: Self-pay

## 2024-07-22 VITALS — BP 130/80 | HR 85 | Ht 67.0 in | Wt 163.8 lb

## 2024-07-22 DIAGNOSIS — R072 Precordial pain: Secondary | ICD-10-CM

## 2024-07-22 DIAGNOSIS — R9431 Abnormal electrocardiogram [ECG] [EKG]: Secondary | ICD-10-CM

## 2024-07-22 DIAGNOSIS — R0789 Other chest pain: Secondary | ICD-10-CM

## 2024-07-22 LAB — BASIC METABOLIC PANEL WITH GFR
BUN/Creatinine Ratio: 18 (ref 9–20)
BUN: 22 mg/dL (ref 6–24)
CO2: 21 mmol/L (ref 20–29)
Calcium: 9.7 mg/dL (ref 8.7–10.2)
Chloride: 103 mmol/L (ref 96–106)
Creatinine, Ser: 1.2 mg/dL (ref 0.76–1.27)
Glucose: 167 mg/dL — ABNORMAL HIGH (ref 70–99)
Potassium: 5.1 mmol/L (ref 3.5–5.2)
Sodium: 138 mmol/L (ref 134–144)
eGFR: 73 mL/min/1.73 (ref >59)

## 2024-07-22 MED ORDER — METOPROLOL TARTRATE 100 MG PO TABS
100.0000 mg | ORAL_TABLET | Freq: Once | ORAL | 0 refills | Status: AC
  Filled 2024-07-22 (×2): qty 1, 1d supply, fill #0

## 2024-07-22 NOTE — Telephone Encounter (Signed)
 Copied from CRM [id number]. Topic: Clinical - Prescription Issue >> Jul 22, 2024  1:57 PM Roselie BROCKS wrote: Reason for CRM:  Insulin  Glargine (BASAGLAR  KWIKPEN) 100 UNIT/ML . Patient states the provider changed the dosage on the medication, and the insurance will not pay for refill until the 30th of December. , but that will mean he has to go 2 weeks without his insuline, and he needs it . Patient requests return call concerning this.

## 2024-07-22 NOTE — Patient Instructions (Addendum)
 Medication Instructions:  Your physician recommends that you continue on your current medications as directed. Please refer to the Current Medication list given to you today.  *If you need a refill on your cardiac medications before your next appointment, please call your pharmacy*  Lab Work: TODAY:  BMET  If you have labs (blood work) drawn today and your tests are completely normal, you will receive your results only by: MyChart Message (if you have MyChart) OR A paper copy in the mail If you have any lab test that is abnormal or we need to change your treatment, we will call you to review the results.  Testing/Procedures: Your physician has requested that you have an echocardiogram. Echocardiography is a painless test that uses sound waves to create images of your heart. It provides your doctor with information about the size and shape of your heart and how well your hearts chambers and valves are working. This procedure takes approximately one hour. There are no restrictions for this procedure. Please do NOT wear cologne, perfume, aftershave, or lotions (deodorant is allowed). Please arrive 15 minutes prior to your appointment time.  Please note: We ask at that you not bring children with you during ultrasound (echo/ vascular) testing. Due to room size and safety concerns, children are not allowed in the ultrasound rooms during exams. Our front office staff cannot provide observation of children in our lobby area while testing is being conducted. An adult accompanying a patient to their appointment will only be allowed in the ultrasound room at the discretion of the ultrasound technician under special circumstances. We apologize for any inconvenience.    Your physician has requested that you have cardiac CT. Cardiac computed tomography (CT) is a painless test that uses an x-ray machine to take clear, detailed pictures of your heart. For further information please visit [email or web address]  Please follow instruction sheet BELOW:     Your cardiac CT will be scheduled at one of the below locations:   [facility] [address] [place], [place] [address] [phone] (Severe contrast allergies only)  OR   [facility] [address] [place], [place] [address] [phone]  OR   [facility] [address] [place], [place] [address] [phone]  OR   [facility] and Vascular Tower [address]  [place], [place] [address]  OR   [facility] [address] [place], [place] [phone]  If scheduled at [facility], please arrive at the [facility] and Children's Entrance (Entrance C2) of [facility] 30 minutes prior to test start time. You can use the FREE valet parking offered at entrance C (encouraged to control the heart rate for the test)  Proceed to the [facility] Radiology Department (first floor) to check-in and test prep.  All radiology patients and guests should use entrance C2 at [facility], accessed from [facility], even though the hospital's physical address listed is [address].  If scheduled at the Heart and Vascular Tower at [address], please enter the parking lot using the [address] entrance and use the FREE valet service at the patient drop-off area. Enter the building and check-in with registration on the main floor.  If scheduled at [facility], please arrive to the Heart and Vascular Center 15 mins early for check-in and test prep.  There is spacious parking and easy access to the radiology department from the [facility]  Vascular entrance. Please enter here and check-in with the desk attendant.   If scheduled at [facility], please arrive 30 minutes early for check-in and test prep.  Please follow these instructions carefully (unless otherwise directed):  An IV will be required  for this test and Nitroglycerin will be given.  Hold all erectile dysfunction medications at least 3 days (72 hrs) prior to test. (Ie viagra , cialis, sildenafil , tadalafil, etc)   On the Night Before the Test: Be sure to Drink plenty of water. Do not consume any caffeinated/decaffeinated beverages or chocolate 12 hours prior to your test. Do not take any antihistamines 12 hours prior to your test.    On the Day of the Test: Drink plenty of water until 1 hour prior to the test. Do not eat any food 1 hour prior to test. You may take your regular medications prior to the test.  Take metoprolol  (Lopressor ) 100 MG two hours prior to test. THIS HAS BEEN SENT TO OUR PHARMACY ON THE 1ST FLOOR BESIDE THE LAB Patients who wear a continuous glucose monitor MUST remove the device prior to scanning.          After the Test: Drink plenty of water. After receiving IV contrast, you may experience a mild flushed feeling. This is normal. On occasion, you may experience a mild rash up to 24 hours after the test. This is not dangerous. If this occurs, you can take Benadryl 25 mg, Zyrtec, Claritin, or Allegra and increase your fluid intake. (Patients taking Tikosyn should avoid Benadryl, and may take Zyrtec, Claritin, or Allegra) If you experience trouble breathing, this can be serious. If it is severe call 911 IMMEDIATELY. If it is mild, please call our office.  We will call to schedule your test 2-4 weeks out understanding that some insurance companies will need an authorization prior to the service being performed.   For more information and frequently asked questions, please visit our website : [email or web address]  For non-scheduling related questions, please contact the cardiac imaging nurse navigator should you have any questions/concerns: Cardiac Imaging Nurse Navigators Direct Office Dial : [phone]   For scheduling needs, including cancellations and rescheduling, please call  Brittany, [phone].   Follow-Up: At [facility], you and your health needs are our priority.  As part of our continuing mission to provide you with exceptional heart care, our providers are all part of one team.  This team includes your primary Cardiologist (physician) and Advanced Practice Providers or APPs (Physician Assistants and Nurse Practitioners) who all work together to provide you with the care you need, when you need it.  Your next appointment:   2 month(s)  Provider:   Madonna Large, DO    We recommend signing up for the patient portal called MyChart.  Sign up information is provided on this After Visit Summary.  MyChart is used to connect with patients for Virtual Visits (Telemedicine).  Patients are able to view lab/test results, encounter notes, upcoming appointments, etc.  Non-urgent messages can be sent to your provider as well.   To learn more about what you can do with MyChart, go to [email or web address].   Other Instructions

## 2024-07-23 ENCOUNTER — Other Ambulatory Visit: Payer: Self-pay

## 2024-07-23 NOTE — Telephone Encounter (Signed)
 Pharmacy gave clarification that the new RX of 33 units has been sent and they have received it.

## 2024-07-23 NOTE — Telephone Encounter (Signed)
 I sent refills with new dose, so it should not be a problem. Can you please call pharmacy for clarification. Thanks, BJ

## 2024-07-24 ENCOUNTER — Encounter: Payer: Self-pay | Admitting: Nurse Practitioner

## 2024-07-29 ENCOUNTER — Ambulatory Visit: Payer: Self-pay | Admitting: Nurse Practitioner

## 2024-07-29 DIAGNOSIS — Z79899 Other long term (current) drug therapy: Secondary | ICD-10-CM

## 2024-07-29 DIAGNOSIS — E785 Hyperlipidemia, unspecified: Secondary | ICD-10-CM

## 2024-08-01 ENCOUNTER — Other Ambulatory Visit: Payer: Self-pay | Admitting: Family Medicine

## 2024-08-01 DIAGNOSIS — I10 Essential (primary) hypertension: Secondary | ICD-10-CM

## 2024-08-05 ENCOUNTER — Encounter (HOSPITAL_COMMUNITY): Payer: Self-pay

## 2024-08-07 ENCOUNTER — Ambulatory Visit (HOSPITAL_BASED_OUTPATIENT_CLINIC_OR_DEPARTMENT_OTHER)
Admission: RE | Admit: 2024-08-07 | Discharge: 2024-08-07 | Disposition: A | Discharge status: Home / Self Care | Source: Ambulatory Visit | Attending: Cardiology | Admitting: Cardiology

## 2024-08-07 ENCOUNTER — Other Ambulatory Visit (HOSPITAL_BASED_OUTPATIENT_CLINIC_OR_DEPARTMENT_OTHER): Payer: Self-pay | Admitting: Cardiology

## 2024-08-07 ENCOUNTER — Ambulatory Visit (HOSPITAL_COMMUNITY)
Admission: RE | Admit: 2024-08-07 | Discharge: 2024-08-07 | Disposition: A | Discharge status: Home / Self Care | Source: Ambulatory Visit | Attending: Student in an Organized Health Care Education/Training Program | Admitting: Student in an Organized Health Care Education/Training Program

## 2024-08-07 DIAGNOSIS — R931 Abnormal findings on diagnostic imaging of heart and coronary circulation: Secondary | ICD-10-CM

## 2024-08-07 DIAGNOSIS — R072 Precordial pain: Secondary | ICD-10-CM | POA: Insufficient documentation

## 2024-08-07 DIAGNOSIS — R9431 Abnormal electrocardiogram [ECG] [EKG]: Secondary | ICD-10-CM | POA: Diagnosis not present

## 2024-08-07 MED ORDER — NITROGLYCERIN 0.4 MG SL SUBL
0.8000 mg | SUBLINGUAL_TABLET | Freq: Once | SUBLINGUAL | Status: AC
  Administered 2024-08-07: 0.8 mg via SUBLINGUAL

## 2024-08-07 MED ORDER — IOHEXOL 350 MG/ML SOLN
100.0000 mL | Freq: Once | INTRAVENOUS | Status: AC | PRN
  Administered 2024-08-07: 100 mL via INTRAVENOUS

## 2024-08-09 ENCOUNTER — Encounter (HOSPITAL_BASED_OUTPATIENT_CLINIC_OR_DEPARTMENT_OTHER): Admitting: Orthopaedic Surgery

## 2024-08-13 ENCOUNTER — Encounter (HOSPITAL_BASED_OUTPATIENT_CLINIC_OR_DEPARTMENT_OTHER): Payer: Self-pay | Admitting: Orthopaedic Surgery

## 2024-08-15 MED ORDER — ROSUVASTATIN CALCIUM 20 MG PO TABS: 20.0000 mg | ORAL_TABLET | Freq: Every day | ORAL | 3 refills | Status: AC

## 2024-08-15 NOTE — Telephone Encounter (Signed)
 FYI: I spoke with Diane, patients daughter and advised her patient still needs to have Echocardiogram that is scheduled for 08/28/24. Once he has that hopefully he will be cleared to reschedule surgery. I advised daughter I would call as soon as I got the clearance to reschedule patient's surgery.

## 2024-08-19 ENCOUNTER — Other Ambulatory Visit: Payer: Self-pay

## 2024-08-19 DIAGNOSIS — E1022 Type 1 diabetes mellitus with diabetic chronic kidney disease: Secondary | ICD-10-CM

## 2024-08-19 MED ORDER — INSULIN GLARGINE 100 UNIT/ML SOLOSTAR PEN: 33.0000 [IU] | PEN_INJECTOR | Freq: Every day | SUBCUTANEOUS | 5 refills | Status: AC

## 2024-08-27 NOTE — Telephone Encounter (Signed)
 April have you received clearance yet?

## 2024-08-28 ENCOUNTER — Ambulatory Visit (HOSPITAL_COMMUNITY)
Admission: RE | Admit: 2024-08-28 | Discharge: 2024-08-28 | Disposition: A | Discharge status: Home / Self Care | Source: Ambulatory Visit | Attending: Cardiology | Admitting: Cardiology

## 2024-08-28 DIAGNOSIS — R9431 Abnormal electrocardiogram [ECG] [EKG]: Secondary | ICD-10-CM | POA: Insufficient documentation

## 2024-08-28 DIAGNOSIS — R0789 Other chest pain: Secondary | ICD-10-CM | POA: Diagnosis present

## 2024-08-28 DIAGNOSIS — I10 Essential (primary) hypertension: Secondary | ICD-10-CM | POA: Diagnosis not present

## 2024-08-28 DIAGNOSIS — R072 Precordial pain: Secondary | ICD-10-CM | POA: Insufficient documentation

## 2024-08-28 LAB — ECHOCARDIOGRAM COMPLETE
Area-P 1/2: 2.95 cm2
S' Lateral: 2.19 cm

## 2024-09-06 ENCOUNTER — Telehealth (HOSPITAL_BASED_OUTPATIENT_CLINIC_OR_DEPARTMENT_OTHER): Payer: Self-pay

## 2024-09-06 NOTE — Telephone Encounter (Signed)
"  ° °  Pre-operative Risk Assessment    Patient Name: Mark Miranda  DOB: 1970/12/07 MRN: [id number]   Date of last office visit: 07/22/24 with [facility] Date of next office visit: 10/02/24 with [facility]   Request for Surgical Clearance    Procedure:  Right shoulder arthroscopy   Date of Surgery:  Clearance TBD                                 Surgeon:  Dr. Genelle Surgeon's Group or Practice Name:  [facility] Phone number:  [phone] Fax number:  [phone]    Type of Clearance Requested:   - Medical  - Pharmacy:  Hold not indicated but patient is in a study- [facility]- Aspirin  81 mg or placebo      Type of Anesthesia:  General    Additional requests/questions:    [name]   09/06/2024, 10:24 AM   "

## 2024-09-06 NOTE — Telephone Encounter (Signed)
 Mark Miranda,  [name] is requesting preoperative cardiac evaluation for right shoulder arthroscopy.  He was recently seen by you in clinic and had reassuring coronary CTA, echo and FFR analysis.  Would you be able to comment on cardiac risk for planned procedure?  Thank you for your help.  [name]. [name] NP-C     09/06/2024, 10:40 AM [facility] [address] [place], [place] [address] Office [phone]

## 2024-09-06 NOTE — Telephone Encounter (Signed)
 Patient did have Echocardiogram, but there is no update in his chart to if he can procedd with surgery. I faxed Cardio a clearance request today. Hopefully we will get an okay to proceed this coming week.

## 2024-09-13 ENCOUNTER — Other Ambulatory Visit (HOSPITAL_BASED_OUTPATIENT_CLINIC_OR_DEPARTMENT_OTHER): Payer: Self-pay | Admitting: Orthopaedic Surgery

## 2024-09-13 DIAGNOSIS — M7501 Adhesive capsulitis of right shoulder: Secondary | ICD-10-CM

## 2024-10-02 ENCOUNTER — Ambulatory Visit: Admitting: Cardiology

## 2024-10-10 ENCOUNTER — Encounter (HOSPITAL_BASED_OUTPATIENT_CLINIC_OR_DEPARTMENT_OTHER): Admitting: Orthopaedic Surgery

## 2024-12-13 ENCOUNTER — Ambulatory Visit: Admitting: Endocrinology
